# Patient Record
Sex: Male | Born: 1955
Health system: Southern US, Community
[De-identification: ages and names within clinical notes are randomized; demographics above are authoritative.]

## PROBLEM LIST (undated history)

## (undated) DIAGNOSIS — F32A Depression, unspecified: Secondary | ICD-10-CM

## (undated) DIAGNOSIS — R7303 Prediabetes: Secondary | ICD-10-CM

## (undated) DIAGNOSIS — K219 Gastro-esophageal reflux disease without esophagitis: Secondary | ICD-10-CM

## (undated) DIAGNOSIS — E785 Hyperlipidemia, unspecified: Secondary | ICD-10-CM

## (undated) DIAGNOSIS — M1990 Unspecified osteoarthritis, unspecified site: Secondary | ICD-10-CM

## (undated) DIAGNOSIS — E119 Type 2 diabetes mellitus without complications: Secondary | ICD-10-CM

## (undated) DIAGNOSIS — K648 Other hemorrhoids: Secondary | ICD-10-CM

## (undated) DIAGNOSIS — K5730 Diverticulosis of large intestine without perforation or abscess without bleeding: Secondary | ICD-10-CM

## (undated) DIAGNOSIS — C61 Malignant neoplasm of prostate: Secondary | ICD-10-CM

## (undated) DIAGNOSIS — M170 Bilateral primary osteoarthritis of knee: Secondary | ICD-10-CM

## (undated) DIAGNOSIS — I10 Essential (primary) hypertension: Secondary | ICD-10-CM

## (undated) HISTORY — PX: SHOULDER ARTHROSCOPY: SHX128

## (undated) HISTORY — DX: Hyperlipidemia, unspecified: E78.5

## (undated) HISTORY — PX: HERNIA REPAIR: SHX51

## (undated) HISTORY — PX: KNEE ARTHROSCOPY: SUR90

---
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
## 2003-04-12 ENCOUNTER — Ambulatory Visit (HOSPITAL_COMMUNITY): Admission: RE | Admit: 2003-04-12 | Discharge: 2003-04-12 | Payer: Self-pay | Admitting: Internal Medicine

## 2005-06-17 ENCOUNTER — Encounter: Admission: RE | Admit: 2005-06-17 | Discharge: 2005-06-17 | Payer: Self-pay | Admitting: Rheumatology

## 2005-06-29 ENCOUNTER — Ambulatory Visit (HOSPITAL_BASED_OUTPATIENT_CLINIC_OR_DEPARTMENT_OTHER): Admission: RE | Admit: 2005-06-29 | Discharge: 2005-06-29 | Payer: Self-pay | Admitting: Orthopaedic Surgery

## 2006-03-02 ENCOUNTER — Encounter: Admission: RE | Admit: 2006-03-02 | Discharge: 2006-03-02 | Payer: Self-pay | Admitting: Internal Medicine

## 2006-03-07 ENCOUNTER — Encounter: Admission: RE | Admit: 2006-03-07 | Discharge: 2006-03-07 | Payer: Self-pay | Admitting: Internal Medicine

## 2007-02-20 ENCOUNTER — Ambulatory Visit: Payer: Self-pay | Admitting: Internal Medicine

## 2010-08-07 ENCOUNTER — Emergency Department (HOSPITAL_COMMUNITY)
Admission: EM | Admit: 2010-08-07 | Discharge: 2010-08-07 | Disposition: A | Discharge status: Home / Self Care | Payer: BC Managed Care – PPO | Attending: Emergency Medicine | Admitting: Emergency Medicine

## 2010-08-07 ENCOUNTER — Emergency Department (HOSPITAL_COMMUNITY): Payer: BC Managed Care – PPO

## 2010-08-07 DIAGNOSIS — Y92009 Unspecified place in unspecified non-institutional (private) residence as the place of occurrence of the external cause: Secondary | ICD-10-CM | POA: Insufficient documentation

## 2010-08-07 DIAGNOSIS — IMO0002 Reserved for concepts with insufficient information to code with codable children: Secondary | ICD-10-CM | POA: Insufficient documentation

## 2010-08-07 DIAGNOSIS — S61409A Unspecified open wound of unspecified hand, initial encounter: Secondary | ICD-10-CM | POA: Insufficient documentation

## 2010-08-07 DIAGNOSIS — Z1810 Retained metal fragments, unspecified: Secondary | ICD-10-CM | POA: Insufficient documentation

## 2010-08-07 NOTE — Op Note (Signed)
[name], [name] NO.:  [id number]   MEDICAL RECORD NO.:  [id number]          PATIENT TYPE:  AMB   LOCATION:  DSC                          FACILITY:  [facility]   PHYSICIAN:  Claude Manges. Whitfield, M.D.DATE OF BIRTH:  07-11-1955   DATE OF PROCEDURE:  06/29/2005  DATE OF DISCHARGE:                                 OPERATIVE REPORT   PREOPERATIVE DIAGNOSIS:  Impingement right shoulder with degenerative joint  disease acromioclavicular joint.   POSTOPERATIVE DIAGNOSIS:  Impingement right shoulder with degenerative joint  disease acromioclavicular joint, with partial tear of biceps tendon base and  posterior labrum.   PROCEDURE:  1.  Arthroscopic debridement, right shoulder, including posterior labrum and      biceps tendon base.  2.  Arthroscopic subacromial decompression.  3.  Open distal clavicle resection.   SURGEON:  Claude Manges. Cleophas Dunker, M.D.   ANESTHESIA:  General with supplemental interscalene nerve block.   COMPLICATIONS:  None.   HISTORY:  55 year old gentleman has been experiencing pain in his right  shoulder for months.  He denies any history of injury or trauma.  He has  had evidence of pain at the [facility] joint with impingement with an MRI scan  revealing severe AC joint arthropathy with edema, capsular distension, and  prominent spurring which would predispose to impingement.  He had mild  subdeltoid bursitis, otherwise, there were no other significant  abnormalities. He did have a type 3 acromion.   DESCRIPTION OF PROCEDURE:  With the patient comfortable on the operating  table and under general orotracheal anesthesia, the patient was placed in a  semi-sitting position with the shoulder frame.  The right shoulder was then  prepped with DuraPrep and the base of the neck circumferentially below the  elbow.  Sterile draping was performed.  A marking pen was used to outline  the [facility] joint, the coracoid, and acromion.,  At a point a fingerbreadth  posterior  and medial to the posterior angle acromion, a small stab wound was  made prior to which Marcaine with epinephrine was injected. The arthroscope  was easily placed into the shoulder joint.   Diagnostic arthroscopy revealed no evidence of chondromalacia of the glenoid  or humeral head.  There were no loose bodies.  The anterior labrum appeared  to be intact.  There was quite a bit of frayed material within the joint  that appeared to originate from the base of the biceps tendon where there  was considerable fraying. A second portal was established anteriorly and  shaving of the base of the biceps was performed with a shaver and the  ArthroCare wand.  There was some fraying of the posterior labrum but without  evidence of an obvious tear from the glenoid. Further debridement was  performed with the ArthroCare wand with a complete debridement.  There was  no evidence of a partial rotator cuff tear.   The arthroscope was then placed in the subacromial space posteriorly, the  cannula in the subacromial space anteriorly, and a third portal was  established in the lateral subacromial space.  An arthroscopic subacromial  decompression was performed removing a moderate amount of bursal tissue.  There was obvious and significant anterior and lateral overhang of the  acromion.  The 6 mm bur was used to obtain an anterior inferior  acromioplasty. I was able to visualize the distal clavicle, there were areas  of erosive cartilage changes. Because of the significant nature of the  distal clavicular edema, I elected to perform an open distal clavicle  resection.   About a 1 inch incision was made directly from posterior to anterior over  the [facility] joint via sharp dissection, carried down to the subcutaneous tissue,  gross bleeders were Bovie coagulated.  The [facility] joint capsule was easily  identified and incised with the Bovie.  A subperiosteal dissection was  performed along the distal 1 cm to 1.5 cm  clavicle and an oscillating saw  was used to remove the distal clavicle. Bleeding bone was covered with bone  wax and any bleeding was controlled with the Bovie.  By finger palpation,  there was no evidence of impingement or any further spurring beneath the  distal clavicle.  The wound was then irrigated with saline solution.  The [facility]  joint capsule was closed with interrupted 0 Vicryl, subcu Vicryl with 2-0  Vicryl, skin closed with a running 3-0 subcuticular Prolene with Steri-  Strips over Benzoin.  The arthroscopic portals were closed anteriorly and  laterally with a staple and posterior left open for drainage.  The wound was  infiltrated with Marcaine with epinephrine.  A sterile bulky dressing was  applied followed by a sling.   PLAN:  Office in one week,  Percocet for pain.      Claude Manges. Cleophas Dunker, M.D.  Electronically Signed     PWW/MEDQ  D:  06/29/2005  T:  06/29/2005  Job:  [id number]

## 2011-12-27 ENCOUNTER — Ambulatory Visit (HOSPITAL_COMMUNITY)
Admission: RE | Admit: 2011-12-27 | Discharge: 2011-12-27 | Disposition: A | Discharge status: Home / Self Care | Payer: BC Managed Care – PPO | Source: Ambulatory Visit | Attending: Pulmonary Disease | Admitting: Pulmonary Disease

## 2011-12-27 ENCOUNTER — Other Ambulatory Visit (HOSPITAL_COMMUNITY): Payer: Self-pay | Admitting: Pulmonary Disease

## 2011-12-27 DIAGNOSIS — M542 Cervicalgia: Secondary | ICD-10-CM

## 2011-12-27 DIAGNOSIS — M5030 Other cervical disc degeneration, unspecified cervical region: Secondary | ICD-10-CM | POA: Insufficient documentation

## 2012-01-07 ENCOUNTER — Other Ambulatory Visit (HOSPITAL_COMMUNITY): Payer: Self-pay | Admitting: Pulmonary Disease

## 2012-01-07 DIAGNOSIS — R200 Anesthesia of skin: Secondary | ICD-10-CM

## 2012-01-07 DIAGNOSIS — R52 Pain, unspecified: Secondary | ICD-10-CM

## 2012-01-11 ENCOUNTER — Other Ambulatory Visit (HOSPITAL_COMMUNITY): Payer: BC Managed Care – PPO

## 2012-01-18 ENCOUNTER — Ambulatory Visit (HOSPITAL_COMMUNITY): Payer: BC Managed Care – PPO

## 2012-03-22 DIAGNOSIS — K648 Other hemorrhoids: Secondary | ICD-10-CM

## 2012-03-22 DIAGNOSIS — K5730 Diverticulosis of large intestine without perforation or abscess without bleeding: Secondary | ICD-10-CM

## 2012-03-22 HISTORY — DX: Diverticulosis of large intestine without perforation or abscess without bleeding: K57.30

## 2012-03-22 HISTORY — DX: Other hemorrhoids: K64.8

## 2012-07-10 ENCOUNTER — Telehealth: Payer: Self-pay

## 2012-07-10 NOTE — Telephone Encounter (Signed)
Pt was referred by Dr. Juanetta Gosling for screening colonoscopy. I called and LMOM for a return call.

## 2012-07-25 NOTE — Telephone Encounter (Signed)
LMOM to call.

## 2012-08-17 ENCOUNTER — Telehealth: Payer: Self-pay

## 2012-08-17 DIAGNOSIS — Z1211 Encounter for screening for malignant neoplasm of colon: Secondary | ICD-10-CM

## 2012-08-18 ENCOUNTER — Other Ambulatory Visit: Payer: Self-pay

## 2012-08-18 DIAGNOSIS — Z1211 Encounter for screening for malignant neoplasm of colon: Secondary | ICD-10-CM

## 2012-08-21 NOTE — Telephone Encounter (Signed)
Gastroenterology Pre-Procedure Form     Request Date: 08/17/2012     Requesting Physician: Dr. Juanetta Gosling     PATIENT INFORMATION:  [name] is a 57 y.o., male (DOB=Nov 08, 1955).  PROCEDURE: Procedure(s) requested: colonoscopy Procedure Reason: screening for colon cancer  PATIENT REVIEW QUESTIONS: The patient reports the following:   1. Diabetes Melitis: no 2. Joint replacements in the past 12 months: no 3. Major health problems in the past 3 months: no 4. Has an artificial valve or MVP:no 5. Has been advised in past to take antibiotics in advance of a procedure like teeth cleaning: no}    MEDICATIONS & ALLERGIES:    Patient reports the following regarding taking any blood thinners:   Plavix? no Aspirin?no Coumadin?  no  Patient confirms/reports the following medications:  Current Outpatient Prescriptions  Medication Sig Dispense Refill  . acetaminophen (TYLENOL) 325 MG tablet Take 650 mg by mouth every 6 (six) hours as needed for pain. Ocassionally      . naproxen sodium (ANAPROX) 220 MG tablet Take 220 mg by mouth 2 (two) times daily with a meal. As needed       No current facility-administered medications for this visit.    Patient confirms/reports the following allergies:  No Known Allergies  Patient is appropriate to schedule for requested procedure(s): yes  AUTHORIZATION INFORMATION Primary Insurance:   ID #:   Group #:  Pre-Cert / Auth required: Pre-Cert / Auth #:   Secondary Insurance:   ID #:   Group #:  Pre-Cert / Auth required: Pre-Cert / Auth #:   No orders of the defined types were placed in this encounter.    SCHEDULE INFORMATION: Procedure has been scheduled as follows:  Date:  09/13/2012            Time: 9:30 AM  Location: [facility] Short Stay  This Gastroenterology Pre-Precedure Form is being routed to the following provider(s) for review: Jonette Eva, MD

## 2012-08-21 NOTE — Telephone Encounter (Signed)
See triage dated 08/17/2012.

## 2012-08-22 ENCOUNTER — Other Ambulatory Visit: Payer: BC Managed Care – PPO

## 2012-08-22 MED ORDER — SOD PICOSULFATE-MAG OX-CIT ACD 10-3.5-12 MG-GM-GM PO PACK: 1.0000 | PACK | Freq: Once | ORAL | Status: DC

## 2012-08-22 NOTE — Telephone Encounter (Signed)
PREPOPIK-DRINK WATER TO KEEP URINE LIGHT YELLOW.  PT SHOULD DROP OFF RX 3 DAYS PRIOR TO PROCEDURE.  Do not take naproxen on the day of or day before his procedure.

## 2012-08-22 NOTE — Telephone Encounter (Signed)
Rx was sent to the pharmacy and instructions were mailed to pt.  

## 2012-08-23 ENCOUNTER — Other Ambulatory Visit: Payer: Self-pay | Admitting: Orthopaedic Surgery

## 2012-08-23 DIAGNOSIS — M25561 Pain in right knee: Secondary | ICD-10-CM

## 2012-08-24 ENCOUNTER — Ambulatory Visit
Admission: RE | Admit: 2012-08-24 | Discharge: 2012-08-24 | Disposition: A | Discharge status: Home / Self Care | Payer: BC Managed Care – PPO | Source: Ambulatory Visit | Attending: Orthopaedic Surgery | Admitting: Orthopaedic Surgery

## 2012-08-24 ENCOUNTER — Other Ambulatory Visit: Payer: BC Managed Care – PPO

## 2012-08-24 DIAGNOSIS — M25561 Pain in right knee: Secondary | ICD-10-CM

## 2012-08-31 ENCOUNTER — Encounter (HOSPITAL_COMMUNITY): Payer: Self-pay | Admitting: Pharmacy Technician

## 2012-09-13 ENCOUNTER — Encounter (HOSPITAL_COMMUNITY): Payer: Self-pay

## 2012-09-13 ENCOUNTER — Encounter (HOSPITAL_COMMUNITY)
Admission: RE | Disposition: A | Discharge status: Home / Self Care | Payer: Self-pay | Source: Ambulatory Visit | Attending: Gastroenterology

## 2012-09-13 ENCOUNTER — Ambulatory Visit (HOSPITAL_COMMUNITY)
Admission: RE | Admit: 2012-09-13 | Discharge: 2012-09-13 | Disposition: A | Discharge status: Home / Self Care | Payer: BC Managed Care – PPO | Source: Ambulatory Visit | Attending: Gastroenterology | Admitting: Gastroenterology

## 2012-09-13 DIAGNOSIS — Z1211 Encounter for screening for malignant neoplasm of colon: Secondary | ICD-10-CM | POA: Insufficient documentation

## 2012-09-13 DIAGNOSIS — K648 Other hemorrhoids: Secondary | ICD-10-CM | POA: Insufficient documentation

## 2012-09-13 DIAGNOSIS — K5730 Diverticulosis of large intestine without perforation or abscess without bleeding: Secondary | ICD-10-CM

## 2012-09-13 HISTORY — PX: COLONOSCOPY: SHX5424

## 2012-09-13 SURGERY — COLONOSCOPY
Anesthesia: Moderate Sedation

## 2012-09-13 MED ORDER — MIDAZOLAM HCL 5 MG/5ML IJ SOLN
INTRAMUSCULAR | Status: DC | PRN
  Administered 2012-09-13 (×3): 2 mg via INTRAVENOUS

## 2012-09-13 MED ORDER — MEPERIDINE HCL 100 MG/ML IJ SOLN
INTRAMUSCULAR | Status: DC | PRN
  Administered 2012-09-13 (×3): 25 mg via INTRAVENOUS

## 2012-09-13 MED ORDER — MIDAZOLAM HCL 5 MG/5ML IJ SOLN
INTRAMUSCULAR | Status: AC
  Filled 2012-09-13: qty 10

## 2012-09-13 MED ORDER — MEPERIDINE HCL 100 MG/ML IJ SOLN
INTRAMUSCULAR | Status: AC
  Filled 2012-09-13: qty 2

## 2012-09-13 MED ORDER — STERILE WATER FOR IRRIGATION IR SOLN
Status: DC | PRN
  Administered 2012-09-13: 09:00:00

## 2012-09-13 MED ORDER — SODIUM CHLORIDE 0.9 % IV SOLN
INTRAVENOUS | Status: DC
  Administered 2012-09-13: 09:00:00 via INTRAVENOUS

## 2012-09-13 NOTE — H&P (Signed)
  Primary Care Physician:  No primary provider on file. Primary Gastroenterologist:  Dr. Darrick Penna  Pre-Procedure History & Physical: HPI:  [name] is a 57 y.o. male here for COLON CANCER SCREENING.   History reviewed. No pertinent past medical history.  Past Surgical History  Procedure Laterality Date  . Knee arthroscopy Left     15 years ago  . Hernia repair      20 years ago  . Shoulder arthroscopy Right     15 years ago    Prior to Admission medications   Medication Sig Start Date End Date Taking? Authorizing Provider  Naproxen Sodium (ALEVE) 220 MG CAPS Take 2 capsules by mouth daily as needed (headache/pain).   Yes Historical Provider, MD  Sod Picosulfate-Mag Ox-Cit Acd 10-3.5-12 MG-GM-GM PACK Take 1 kit by mouth once. 08/22/12  Yes [place] Bali, MD    Allergies as of 08/18/2012  . (Not on File)    History reviewed. No pertinent family history.  History   Social History  . Marital Status: Married    Spouse Name: N/A    Number of Children: N/A  . Years of Education: N/A   Occupational History  . Not on file.   Social History Main Topics  . Smoking status: Never Smoker   . Smokeless tobacco: Not on file  . Alcohol Use: No  . Drug Use: No  . Sexually Active: Not on file   Other Topics Concern  . Not on file   Social History Narrative  . No narrative on file    Review of Systems: See HPI, otherwise negative ROS   Physical Exam: BP 136/84  Pulse 75  Temp(Src) 97.7 F (36.5 C) (Oral)  Resp 16  Ht 5\' 9"  (1.753 m)  Wt 190 lb (86.183 kg)  BMI 28.05 kg/m2  SpO2 96% General:   Alert,  pleasant and cooperative in NAD Head:  Normocephalic and atraumatic. Neck:  Supple; Lungs:  Clear throughout to auscultation.    Heart:  Regular rate and rhythm. Abdomen:  Soft, nontender and nondistended. Normal bowel sounds, without guarding, and without rebound.   Neurologic:  Alert and  oriented x4;  grossly normal neurologically.  Impression/Plan:      SCREENING  Plan:  1. TCS TODAY

## 2012-09-13 NOTE — Op Note (Signed)
[facility] [address] [place], [address]   COLONOSCOPY PROCEDURE REPORT  PATIENT: [name], [name]  MR#: [id number] BIRTHDATE: [birth year] , 56  yrs. old GENDER: Male ENDOSCOPIST: Jonette Eva, MD REFERRED XB:JYNWGN Juanetta Gosling, M.D. PROCEDURE DATE:  09/13/2012 PROCEDURE:   Colonoscopy, screening INDICATIONS:Average risk patient for colon cancer.  Ate bacon cheesburger from [employer] at lunch. MEDICATIONS: Demerol 75 mg IV and Versed 6 mg IV  DESCRIPTION OF PROCEDURE:    Physical exam was performed.  Informed consent was obtained from the patient after explaining the benefits, risks, and alternatives to procedure.  The patient was connected to monitor and placed in left lateral position. Continuous oxygen was provided by nasal cannula and IV medicine administered through an indwelling cannula.  After administration of sedation and rectal exam, the patients rectum was intubated and the EC-3890Li ([id number])  colonoscope was advanced under direct visualization to the ileum.  The scope was removed slowly by carefully examining the color, texture, anatomy, and integrity mucosa on the way out.  The patient was recovered in endoscopy and discharged home in satisfactory condition.     COLON FINDINGS: The mucosa appeared normal in the terminal ileum.  , Mild diverticulosis was noted in the sigmoid colon.  , and Small internal hemorrhoids were found.  PREP QUALITY: good.     CECAL W/D TIME: 15 minutes COMPLICATIONS: None  ENDOSCOPIC IMPRESSION: 1.   Normal mucosa in the terminal ileum 2.   Mild diverticulosis in the sigmoid colon 3.   Small internal hemorrhoids   RECOMMENDATIONS: HIGH FIBER DIET TCS IN 10 YEARS       _______________________________ eSignedJonette Eva, MD 09/13/2012 10:23 AM

## 2012-09-14 ENCOUNTER — Encounter (HOSPITAL_COMMUNITY): Payer: Self-pay | Admitting: Gastroenterology

## 2015-08-07 DIAGNOSIS — M25562 Pain in left knee: Secondary | ICD-10-CM | POA: Diagnosis not present

## 2015-08-07 DIAGNOSIS — M25561 Pain in right knee: Secondary | ICD-10-CM | POA: Diagnosis not present

## 2016-05-13 DIAGNOSIS — L821 Other seborrheic keratosis: Secondary | ICD-10-CM | POA: Diagnosis not present

## 2016-05-13 DIAGNOSIS — L812 Freckles: Secondary | ICD-10-CM | POA: Diagnosis not present

## 2016-05-17 ENCOUNTER — Ambulatory Visit (INDEPENDENT_AMBULATORY_CARE_PROVIDER_SITE_OTHER): Payer: BLUE CROSS/BLUE SHIELD | Admitting: Orthopaedic Surgery

## 2016-05-17 VITALS — Ht 69.0 in | Wt 190.0 lb

## 2016-05-17 DIAGNOSIS — M1712 Unilateral primary osteoarthritis, left knee: Secondary | ICD-10-CM | POA: Diagnosis not present

## 2016-05-17 DIAGNOSIS — G8929 Other chronic pain: Secondary | ICD-10-CM

## 2016-05-17 DIAGNOSIS — M25561 Pain in right knee: Secondary | ICD-10-CM

## 2016-05-17 DIAGNOSIS — M25562 Pain in left knee: Secondary | ICD-10-CM | POA: Diagnosis not present

## 2016-05-17 DIAGNOSIS — M1711 Unilateral primary osteoarthritis, right knee: Secondary | ICD-10-CM | POA: Diagnosis not present

## 2016-05-17 MED ORDER — LIDOCAINE HCL 1 % IJ SOLN
3.0000 mL | INTRAMUSCULAR | Status: AC | PRN
  Administered 2016-05-17: 3 mL

## 2016-05-17 MED ORDER — METHYLPREDNISOLONE ACETATE 40 MG/ML IJ SUSP
40.0000 mg | INTRAMUSCULAR | Status: AC | PRN
  Administered 2016-05-17: 40 mg via INTRA_ARTICULAR

## 2016-05-17 NOTE — Progress Notes (Signed)
Office Visit Note   Patient: [name]           Date of Birth: 05-13-1955           MRN: [id number] Visit Date: 05/17/2016              Requested by: No referring provider defined for this encounter. PCP: No primary care provider on file.   Assessment & Plan: Visit Diagnoses: No diagnosis found.  Plan: At this point he is tried and failed all forms conservative treatment. We went over knee replacement surgery and explained in detail the risk and benefits of the surgery. He's even tried a long period of physical therapy and almost is program on his knees. Having failed surgery as well as injections we do feel some proceed with the surgery. He understands the risk and benefits of this in detail. I showed him a knee model and explained what is intraoperative and postoperative courses be. He tolerated the steroid injection in his right knee well-padded work on getting him set up for surgery at a later date.  Follow-Up Instructions: Return for 2 weeks post-op.   Orders:  Orders Placed This Encounter  Procedures  . Large Joint Injection/Arthrocentesis   No orders of the defined types were placed in this encounter.     Procedures: Large Joint Inj Date/Time: 05/17/2016 2:34 PM Performed by: Mcarthur Rossetti Authorized by: Mcarthur Rossetti   Location:  Knee Site:  R knee Ultrasound Guidance: No   Fluoroscopic Guidance: No   Arthrogram: No   Medications:  3 mL lidocaine 1 %; 40 mg methylPREDNISolone acetate 40 MG/ML     Clinical Data: No additional findings.   Subjective: Chief Complaint  Patient presents with  . Right Knee - Pain    R>L knee pain. The right knee is getting really bad and he has decreased flexibility. He had knee injections 08/08/2015 that did really well. Wants to discuss knee replacements.   . Left Knee - Pain    HPI Seen the patient for many years now. He's had multiple arthroscopic interventions on both knees with 4 on the left  knee and at least one on the right knee. His pain is daily and now it's detrimentally affected activities daily living, his quality of life and mobility. He does wish proceed with knee replacement surgery at this point he like to have done both at the same time. He is a very healthy individual he's athletic individual. He has already tried multiple injections in each knee. He is tried activity modification and resorted thin individual. Review of Systems He denies any chest pain, shortness of breath, fever, chills, nausea of vomiting  Objective: Vital Signs: Ht 5\' 9"  (1.753 m)   Wt 190 lb (86.2 kg)   BMI 28.06 kg/m   Physical Exam He is alert and oriented 3 and in no acute distress Ortho Exam His physical exam in terms of his lungs, heart, abdomen and neuro exam are all normal. Examination of both of his knee shows slight varus 40. Both knees have severe patellofemoral crepitation and can get him to full flexion or extension on both knees. There is no effusion today. Both knees are Lemus a stable but are very painful. Specialty Comments:  No specialty comments available.  Imaging: No results found. X-rays from last year both his knees are really reviewed independently. His left knee has severe try femoral arthritis in the right knee has moderate to severe tricompartmental arthritis  PMFS  History: There are no active problems to display for this patient.  No past medical history on file.  No family history on file.  Past Surgical History:  Procedure Laterality Date  . COLONOSCOPY N/A 09/13/2012   Procedure: COLONOSCOPY;  Surgeon: Danie Binder, MD;  Location: [facility];  Service: Endoscopy;  Laterality: N/A;  9:30 AM  . HERNIA REPAIR     20 years ago  . KNEE ARTHROSCOPY Left    15 years ago  . SHOULDER ARTHROSCOPY Right    15 years ago   Social History   Occupational History  . Not on file.   Social History Main Topics  . Smoking status: Never Smoker  . Smokeless  tobacco: Not on file  . Alcohol use No  . Drug use: No  . Sexual activity: Not on file

## 2016-07-26 DIAGNOSIS — E119 Type 2 diabetes mellitus without complications: Secondary | ICD-10-CM | POA: Diagnosis not present

## 2016-09-07 DIAGNOSIS — S90931A Unspecified superficial injury of right great toe, initial encounter: Secondary | ICD-10-CM | POA: Diagnosis not present

## 2016-09-07 DIAGNOSIS — E1165 Type 2 diabetes mellitus with hyperglycemia: Secondary | ICD-10-CM | POA: Diagnosis not present

## 2016-09-07 DIAGNOSIS — S90936A Unspecified superficial injury of unspecified lesser toe(s), initial encounter: Secondary | ICD-10-CM | POA: Diagnosis not present

## 2017-01-04 DIAGNOSIS — G629 Polyneuropathy, unspecified: Secondary | ICD-10-CM | POA: Diagnosis not present

## 2017-01-04 DIAGNOSIS — N411 Chronic prostatitis: Secondary | ICD-10-CM | POA: Diagnosis not present

## 2017-01-04 DIAGNOSIS — E119 Type 2 diabetes mellitus without complications: Secondary | ICD-10-CM | POA: Diagnosis not present

## 2017-01-04 DIAGNOSIS — N410 Acute prostatitis: Secondary | ICD-10-CM | POA: Diagnosis not present

## 2017-01-04 DIAGNOSIS — Z23 Encounter for immunization: Secondary | ICD-10-CM | POA: Diagnosis not present

## 2017-01-04 DIAGNOSIS — Z1389 Encounter for screening for other disorder: Secondary | ICD-10-CM | POA: Diagnosis not present

## 2017-01-04 DIAGNOSIS — Z6827 Body mass index (BMI) 27.0-27.9, adult: Secondary | ICD-10-CM | POA: Diagnosis not present

## 2017-01-05 DIAGNOSIS — N410 Acute prostatitis: Secondary | ICD-10-CM | POA: Diagnosis not present

## 2017-01-05 DIAGNOSIS — Z6827 Body mass index (BMI) 27.0-27.9, adult: Secondary | ICD-10-CM | POA: Diagnosis not present

## 2017-01-18 DIAGNOSIS — L570 Actinic keratosis: Secondary | ICD-10-CM | POA: Diagnosis not present

## 2017-01-18 DIAGNOSIS — L578 Other skin changes due to chronic exposure to nonionizing radiation: Secondary | ICD-10-CM | POA: Diagnosis not present

## 2017-02-21 ENCOUNTER — Ambulatory Visit (INDEPENDENT_AMBULATORY_CARE_PROVIDER_SITE_OTHER): Payer: BLUE CROSS/BLUE SHIELD | Admitting: Orthopaedic Surgery

## 2017-02-21 ENCOUNTER — Encounter (INDEPENDENT_AMBULATORY_CARE_PROVIDER_SITE_OTHER): Payer: Self-pay | Admitting: Orthopaedic Surgery

## 2017-02-21 ENCOUNTER — Ambulatory Visit (INDEPENDENT_AMBULATORY_CARE_PROVIDER_SITE_OTHER): Payer: BLUE CROSS/BLUE SHIELD

## 2017-02-21 DIAGNOSIS — M1712 Unilateral primary osteoarthritis, left knee: Secondary | ICD-10-CM

## 2017-02-21 DIAGNOSIS — M25561 Pain in right knee: Secondary | ICD-10-CM | POA: Diagnosis not present

## 2017-02-21 DIAGNOSIS — M25562 Pain in left knee: Secondary | ICD-10-CM

## 2017-02-21 DIAGNOSIS — M1711 Unilateral primary osteoarthritis, right knee: Secondary | ICD-10-CM

## 2017-02-21 DIAGNOSIS — G8929 Other chronic pain: Secondary | ICD-10-CM | POA: Insufficient documentation

## 2017-02-21 NOTE — Progress Notes (Signed)
Office Visit Note   Patient: [name]           Date of Birth: 04-03-1955           MRN: [id number] Visit Date: 02/21/2017              Requested by: [name], MD [address] [place], [place] [address] PCP: [name], MD   Assessment & Plan: Visit Diagnoses:  1. Chronic pain of both knees   2. Unilateral primary osteoarthritis, left knee   3. Unilateral primary osteoarthritis, right knee     Plan: Given the fact that he is very healthy individual and not a smoker nondiabetic I do feel that he is a candidate for bilateral knee replacements also given the fact that his pain is significant on both knees and his arthritis is significant on both knees.  We had a long and thorough discussion about the surgery including some the knee model and explained what his intraoperative and postoperative courses involved.  He understands all his risks are heightened given bilateral knee replacements.  All questions concerns were answered and addressed.  He would like to have this set up for January 2019.  Again he is failed all forms of conservative treatment for many years now.  Follow-Up Instructions: Return for 2 weeks post-op.   Orders:  Orders Placed This Encounter  Procedures  . XR Knee 1-2 Views Left  . XR Knee 1-2 Views Right   No orders of the defined types were placed in this encounter.     Procedures: No procedures performed   Clinical Data: No additional findings.   Subjective: No chief complaint on file. The patient is well-known to [place].  We have seen him for many years now with bilateral knee osteoarthritis and degenerative joint disease.  Radiographically his left knee has been worse than his right but his right knee is always hurt worse than left.  He has had arthroscopic surgery on the left knee.  He is tried and failed multiple forms of conservative treatment over years now including multiple steroid injections and hyaluronic acid injections.  His  work on activity modification and weight loss as well as quad strengthening exercises.  He said therapy as well.  He is at the point where he like to discuss bilateral knee replacements.  His pain is daily and is detrimentally affect his activities of living, and his quality of life, his mobility.  His pain can be 10 out of 10.  He is not a diabetic and not a smoker.  He is alert and oriented x3 and in no acute distress  HPI  Review of Systems He currently denies any headache, chest pain, shortness of breath, fever, chills, nausea, vomiting.  Objective: Vital Signs: There were no vitals taken for this visit.  Physical Exam  Ortho Exam Examination of both his knee shows stiffness with good range of motion but very stiff.  Both knees have varus malalignment and patellofemoral crepitation Specialty Comments:  No specialty comments available.  Imaging: Xr Knee 1-2 Views Left  Result Date: 02/21/2017 2 views of the left knee show severe end-stage arthritis with tricompartment osteophytes in all 3 compartments as well as sclerotic changes and severe joint space narrowing throughout the knee.  Xr Knee 1-2 Views Right  Result Date: 02/21/2017 2 views of the right knee show tricompartmental arthritic changes throughout the knee.  There are para-articular osteophytes in all 3 compartments and slight varus malalignment.  There is medial  joint line narrowing.    PMFS History: Patient Active Problem List   Diagnosis Date Noted  . Chronic pain of both knees 02/21/2017  . Unilateral primary osteoarthritis, left knee 02/21/2017  . Unilateral primary osteoarthritis, right knee 02/21/2017   History reviewed. No pertinent past medical history.  History reviewed. No pertinent family history.  Past Surgical History:  Procedure Laterality Date  . COLONOSCOPY N/A 09/13/2012   Procedure: COLONOSCOPY;  Surgeon: Danie Binder, MD;  Location: [facility];  Service: Endoscopy;  Laterality: N/A;  9:30  AM  . HERNIA REPAIR     20 years ago  . KNEE ARTHROSCOPY Left    15 years ago  . SHOULDER ARTHROSCOPY Right    15 years ago   Social History   Occupational History  . Not on file  Tobacco Use  . Smoking status: Never Smoker  Substance and Sexual Activity  . Alcohol use: No  . Drug use: No  . Sexual activity: Not on file

## 2017-03-09 DIAGNOSIS — N401 Enlarged prostate with lower urinary tract symptoms: Secondary | ICD-10-CM | POA: Diagnosis not present

## 2017-03-09 DIAGNOSIS — R3912 Poor urinary stream: Secondary | ICD-10-CM | POA: Diagnosis not present

## 2017-03-09 DIAGNOSIS — N50811 Right testicular pain: Secondary | ICD-10-CM | POA: Diagnosis not present

## 2017-03-09 DIAGNOSIS — R972 Elevated prostate specific antigen [PSA]: Secondary | ICD-10-CM | POA: Diagnosis not present

## 2017-03-18 DIAGNOSIS — N509 Disorder of male genital organs, unspecified: Secondary | ICD-10-CM | POA: Diagnosis not present

## 2017-03-22 DIAGNOSIS — E119 Type 2 diabetes mellitus without complications: Secondary | ICD-10-CM

## 2017-03-22 HISTORY — DX: Type 2 diabetes mellitus without complications: E11.9

## 2017-03-31 DIAGNOSIS — R972 Elevated prostate specific antigen [PSA]: Secondary | ICD-10-CM | POA: Diagnosis not present

## 2017-03-31 DIAGNOSIS — C61 Malignant neoplasm of prostate: Secondary | ICD-10-CM | POA: Diagnosis not present

## 2017-04-05 ENCOUNTER — Other Ambulatory Visit: Payer: Self-pay | Admitting: Urology

## 2017-04-05 DIAGNOSIS — C61 Malignant neoplasm of prostate: Secondary | ICD-10-CM

## 2017-04-06 ENCOUNTER — Encounter (HOSPITAL_COMMUNITY): Payer: BLUE CROSS/BLUE SHIELD

## 2017-04-08 ENCOUNTER — Encounter (HOSPITAL_COMMUNITY): Admission: RE | Admit: 2017-04-08 | Payer: BLUE CROSS/BLUE SHIELD | Source: Ambulatory Visit

## 2017-04-12 ENCOUNTER — Encounter (HOSPITAL_COMMUNITY)
Admission: RE | Admit: 2017-04-12 | Discharge: 2017-04-12 | Disposition: A | Discharge status: Home / Self Care | Payer: BLUE CROSS/BLUE SHIELD | Source: Ambulatory Visit | Attending: Urology | Admitting: Urology

## 2017-04-12 DIAGNOSIS — C61 Malignant neoplasm of prostate: Secondary | ICD-10-CM | POA: Insufficient documentation

## 2017-04-12 MED ORDER — AXUMIN (FLUCICLOVINE F 18) INJECTION
10.5000 | Freq: Once | INTRAVENOUS | Status: AC
  Administered 2017-04-12: 10.5 via INTRAVENOUS

## 2017-04-15 ENCOUNTER — Other Ambulatory Visit (HOSPITAL_COMMUNITY): Payer: BLUE CROSS/BLUE SHIELD

## 2017-04-19 ENCOUNTER — Ambulatory Visit (HOSPITAL_COMMUNITY): Payer: Self-pay

## 2017-04-19 ENCOUNTER — Telehealth: Payer: Self-pay | Admitting: Medical Oncology

## 2017-04-19 ENCOUNTER — Encounter: Payer: Self-pay | Admitting: *Deleted

## 2017-04-19 NOTE — Telephone Encounter (Signed)
I called pt to introduce myself as the Prostate Nurse Navigator and the Coordinator of the Prostate [place].  1. I confirmed with the patient he is aware of his referral to the clinic February 8 arriving at 8:00am.  2. I discussed the format of the clinic and the physicians he will be seeing that day.  3. I discussed where the clinic is located and how to contact me.  4. I confirmed his address and informed him I would be mailing a packet of information and forms to be completed. I asked him to bring them with him the day of his appointment.   He voiced understanding of the above. I asked him to call me if he has any questions or concerns regarding his appointments or the forms he needs to complete.

## 2017-04-22 ENCOUNTER — Encounter: Payer: Self-pay | Admitting: Medical Oncology

## 2017-04-28 ENCOUNTER — Encounter: Payer: Self-pay | Admitting: Radiation Oncology

## 2017-04-28 ENCOUNTER — Telehealth: Payer: Self-pay | Admitting: Medical Oncology

## 2017-04-28 DIAGNOSIS — C61 Malignant neoplasm of prostate: Secondary | ICD-10-CM | POA: Insufficient documentation

## 2017-04-28 NOTE — Progress Notes (Signed)
Called [facility] to request prostate biopsy slides for [facility].

## 2017-04-28 NOTE — Progress Notes (Signed)
GU Location of Tumor / Histology: prostatic adenocarcinoma  If Prostate Cancer, Gleason Score is (4 + 5) and PSA is (15.6). Prostate volume: 35 grams  [name] presented to Dr. Jeffie Pollock with testicular pain x 1 month in December 2018. Reported lifting or twisting and intercourse made the testicular pain worse. Reported Aleve made the pain better.  Biopsies of prostate (if applicable) revealed:    Past/Anticipated interventions by urology, if any: biopsy, referral to [facility]  Past/Anticipated interventions by medical oncology, if any: no  Weight changes, if any: no  Bowel/Bladder complaints, if any: no   Nausea/Vomiting, if any: no  Pain issues, if any:  Testicular pain   SAFETY ISSUES:  Prior radiation? no  Pacemaker/ICD? no  Possible current pregnancy? no  Is the patient on methotrexate? no  Current Complaints / other details:  62 year old male. Married. 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 One daughter (adopted).

## 2017-04-28 NOTE — Telephone Encounter (Signed)
Called patient to confirm appointment for Prostate [facility] 04/29/17 arriving at 8:00 am. I reminded him to bring his completed medical forms and reviewed valet parking and registration. He voiced understanding.

## 2017-04-29 ENCOUNTER — Encounter: Payer: Self-pay | Admitting: General Practice

## 2017-04-29 ENCOUNTER — Other Ambulatory Visit: Payer: Self-pay

## 2017-04-29 ENCOUNTER — Ambulatory Visit
Admission: RE | Admit: 2017-04-29 | Discharge: 2017-04-29 | Disposition: A | Discharge status: Home / Self Care | Payer: BLUE CROSS/BLUE SHIELD | Source: Ambulatory Visit | Attending: Radiation Oncology | Admitting: Radiation Oncology

## 2017-04-29 ENCOUNTER — Encounter: Payer: Self-pay | Admitting: Medical Oncology

## 2017-04-29 ENCOUNTER — Encounter: Payer: Self-pay | Admitting: Radiation Oncology

## 2017-04-29 ENCOUNTER — Inpatient Hospital Stay: Payer: BLUE CROSS/BLUE SHIELD | Attending: Oncology | Admitting: Oncology

## 2017-04-29 VITALS — BP 157/79 | HR 63 | Temp 98.0°F | Resp 18 | Ht 70.0 in | Wt 189.0 lb

## 2017-04-29 DIAGNOSIS — Z801 Family history of malignant neoplasm of trachea, bronchus and lung: Secondary | ICD-10-CM | POA: Insufficient documentation

## 2017-04-29 DIAGNOSIS — Z79891 Long term (current) use of opiate analgesic: Secondary | ICD-10-CM | POA: Insufficient documentation

## 2017-04-29 DIAGNOSIS — C61 Malignant neoplasm of prostate: Secondary | ICD-10-CM | POA: Insufficient documentation

## 2017-04-29 DIAGNOSIS — Z791 Long term (current) use of non-steroidal anti-inflammatories (NSAID): Secondary | ICD-10-CM | POA: Diagnosis not present

## 2017-04-29 DIAGNOSIS — Z809 Family history of malignant neoplasm, unspecified: Secondary | ICD-10-CM | POA: Diagnosis not present

## 2017-04-29 DIAGNOSIS — R972 Elevated prostate specific antigen [PSA]: Secondary | ICD-10-CM | POA: Diagnosis not present

## 2017-04-29 DIAGNOSIS — Z79899 Other long term (current) drug therapy: Secondary | ICD-10-CM | POA: Diagnosis not present

## 2017-04-29 HISTORY — DX: Unspecified osteoarthritis, unspecified site: M19.90

## 2017-04-29 HISTORY — DX: Malignant neoplasm of prostate: C61

## 2017-04-29 NOTE — Progress Notes (Signed)
[place] Psychosocial Distress Screening Spiritual Care  Met with [name] and family in [facility] to introduce [place] team/resources, reviewing distress screen per protocol.  The patient scored a 0 on the Psychosocial Distress Thermometer which indicates minimal distress. Also assessed for distress and other psychosocial needs.   [facility] DISTRESS SCREENING 04/29/2017  Screening Type Initial Screening  Distress experienced in past week (1-10) 0  Referral to support programs Yes   Per pt and family, distress is minimal. They are eager to move forward with treatment plan. Dtr [name]/RN and I are personally acquainted from working at [facility] together.  Pt/family are aware of array of [place] team members and programs and know to contact us anytime. Please also page if immediate needs arise or circumstances change. Thank you.  Follow up needed: No.   Chaplain Lorrin Jackson, [place], [facility] Pager [phone] Voicemail [phone]

## 2017-04-29 NOTE — Consult Note (Signed)
[facility]     04/29/2017   --------------------------------------------------------------------------------   [name]  MRN: [id number]  PRIMARY CARE:    DOB: May 31, 1955, 62 year old Male  REFERRING:  Mark Morita, MD  SSN: [id number]  PROVIDER:  Irine Beard, M.D.    TREATING:  [name], M.D.    LOCATION:  [facility] [phone]   --------------------------------------------------------------------------------   CC/HPI: CC: Prostate Cancer   Physician requesting consult: [name]  PCP: [name]  Location of consult: [facility] - Prostate Cancer Multidisciplinary Clinic   [name] is a 62 year old gentleman who was noted to have an elevated PSA of 15.6 and a diffusely firm prostate. He underwent a TRUS biopsy of the prostate on 03/31/17 that indicated Gleason 4+5=9 adenocarcinoma with 12 out of 13 biopsy cores positive including 90% of a biopsy of the left seminal vesicle.   Family history: None.   Imaging studies: He did undergo a fluciclovine PET scan. This did demonstrate a avid uptake throughout the entire prostate without evidence of any obvious metastatic disease.   PMH: He has a history of hyperglycemia.  PSH: Right inguinal hernia repair   TNM stage: cT3b N0 M0  PSA: 15.6  Gleason score: 4+5=9  Biopsy (03/31/17): 12/13 cores positive  Left: L lateral apex (80%, 4+3=7, PNI), L apex (60%, 3+4=7, PNI), L lateral mid (90%, 4+5=9, PNI), L mid (90%, 3+5=8, PNI), L lateral base (80%, 4+4=8, PNI), L base (90%, 4+3=7)  Right: R apex (50%, 3+4=7), R lateral apex (30%, 3+4=7), R mid (5%, 3+4=7), R base (70%, 3+4=7, PNI), R lateral base (10%, 4+3=7)  SV: L SV (90%, 4+5=9, PNI)  Prostate volume: 35.0 cc   Nomogram  OC disease: 3%  EPE: 96%  SVI: 74%  LNI: 71%  PFS (5 year, 10 year): 10%,6%   Urinary function: IPSS is 17.  Erectile function: SHIM score is 25.     ALLERGIES: None   MEDICATIONS:  Levaquin 750 mg tablet 1 tablet 1 hour prior to procedure  Tamsulosin Hcl 0.4 mg capsule 1 capsule PO Daily  Aleve PRN  Diazepam 10 mg tablet 1-2 tablet PO Daily Take one hour prior to procedure.  Tramadol Hcl 50 mg tablet 1 tablet PO Q 6 H PRN     GU PSH: Prostate Needle Biopsy - 03/31/2017    NON-GU PSH: Incisional hernia repair (open) Knee replacement, Bilateral Shoulder Surgery (Unspecified), Right Surgical Pathology, Gross And Microscopic Examination For Prostate Needle - 03/31/2017    GU PMH: Elevated PSA - 03/31/2017, He had an elevated PSA with possible prostatitis. His symptoms are a little better post bactrim but his prostate is diffusely firm. I am going to repeat a PSA today and if it is not falling, I will have him return for a prostate [place] and biopsy. I reviewed the risks of the biopsy including bleeding, infection and voiding difficulty. If the PSA is falling, I will give him a month of antibiotics and recheck him. , - 03/09/2017 Disorder of male genital organs, unspecified (Worsening, Chronic), Right, Ketorolac 30 mg IM today. Continue with good scrotal support. SMZ-TMP DS 1 po BID X 10 days. - 03/18/2017 BPH w/LUTS, He has persistent LUTS with an IPSS of 21. I am going to resume the tamsulosin and will have him return for a flowrate and PVR. - 03/09/2017 Right testicular pain, He has right testicular pain that may be referred from the right groin. He is  tender in the groin and could have a stain. I have recommended aleve 2 tabs bid for a couple of weeks. - 03/09/2017 Weak Urinary Stream - 03/09/2017    NON-GU PMH: Glycosuria, He has 2+ glucose today and has had borderline hyperglycemia. - 03/09/2017 Arthritis    FAMILY HISTORY: Cancer - Mother Death of family member - Mother, Father    Notes: 1 daughter (adopted)   SOCIAL HISTORY: Marital Status: Married Preferred Language: English; Ethnicity: Not Hispanic Or Latino; Race: White Current Smoking Status: Patient has never  smoked.   Tobacco Use Assessment Completed: Used Tobacco in last 30 days? Does not use smokeless tobacco. Has never drank.  Drinks 3 caffeinated drinks per day. Patient's occupation Engineer, maintenance.    REVIEW OF SYSTEMS:    GU Review Male:   Patient denies frequent urination, hard to postpone urination, burning/ pain with urination, get up at night to urinate, leakage of urine, stream starts and stops, trouble starting your streams, and have to strain to urinate .  Gastrointestinal (Lower):   Patient denies diarrhea and constipation.  Gastrointestinal (Upper):   Patient denies nausea and vomiting.  Constitutional:   Patient denies fever, night sweats, weight loss, and fatigue.  Skin:   Patient denies skin rash/ lesion and itching.  Eyes:   Patient denies blurred vision and double vision.  Ears/ Nose/ Throat:   Patient denies sore throat and sinus problems.  Hematologic/Lymphatic:   Patient denies swollen glands and easy bruising.  Cardiovascular:   Patient denies leg swelling and chest pains.  Respiratory:   Patient denies cough and shortness of breath.  Endocrine:   Patient denies excessive thirst.  Musculoskeletal:   Patient denies back pain and joint pain.  Neurological:   Patient denies headaches and dizziness.  Psychologic:   Patient denies depression and anxiety.   VITAL SIGNS: None   GU PHYSICAL EXAMINATION:    Prostate: His prostate is diffusely firm with increased induration on the left compared to right with extension toward the left base and left seminal vesicle. This is consistent with extraprostatic disease.   MULTI-SYSTEM PHYSICAL EXAMINATION:    Constitutional: Well-nourished. No physical deformities. Normally developed. Good grooming.  Neck: Neck symmetrical, not swollen. Normal tracheal position.  Respiratory: No labored breathing, no use of accessory muscles. Clear bilaterally.  Cardiovascular: Normal temperature, normal extremity pulses, no swelling, no  varicosities. Regular rate and rhythm.  Lymphatic: No enlargement of neck, axillae, groin.  Skin: No paleness, no jaundice, no cyanosis. No lesion, no ulcer, no rash.  Neurologic / Psychiatric: Oriented to time, oriented to place, oriented to person. No depression, no anxiety, no agitation.  Gastrointestinal: No mass, no tenderness, no rigidity, non obese abdomen.  Eyes: Normal conjunctivae. Normal eyelids.  Ears, Nose, Mouth, and Throat: Left ear no scars, no lesions, no masses. Right ear no scars, no lesions, no masses. Nose no scars, no lesions, no masses. Normal hearing. Normal lips.  Musculoskeletal: Normal gait and station of head and neck.     PAST DATA REVIEWED:  Source Of History:  Patient  Lab Test Review:   PSA  Records Review:   Pathology Reports, Previous Patient Records  Urine Test Review:   Urinalysis  X-Ray Review: PET Scan: Reviewed Films.     03/09/17 02/15/17  PSA  Total PSA 15.60 ng/mL 11.722 ng/dl    PROCEDURES: None   ASSESSMENT:      ICD-10 Details  1 GU:   Prostate Cancer - C61    PLAN:  Document Letter(s):  Created for Patient: Clinical Summary         Notes:   1. Very high risk prostate cancer: I had a detailed discussion with Mr. Obrecht, his wife, and his daughter (who is a Marine scientist at [employer]). We discussed the very high risk nature of his disease and the potential that he harbored micrometastatic disease although has had a negative staging evaluation.   I did recommend that he proceed with aggressive therapy of curative intent understanding the realistic nature of cure. He is scheduled to see both Dr. Alen Blew and Dr. Tammi Klippel as well this morning. The patient was counseled about the natural history of prostate cancer and the standard treatment options that are available for prostate cancer. It was explained to him how his age and life expectancy, clinical stage, Gleason score, and PSA affect his prognosis, the decision to proceed with  additional staging studies, as well as how that information influences recommended treatment strategies. We discussed the roles for active surveillance, radiation therapy, surgical therapy, androgen deprivation, as well as ablative therapy options for the treatment of prostate cancer as appropriate to his individual cancer situation. We discussed the risks and benefits of these options with regard to their impact on cancer control and also in terms of potential adverse events, complications, and impact on quality of life particularly related to urinary and sexual function. The patient was encouraged to ask questions throughout the discussion today and all questions were answered to his stated satisfaction. In addition, the patient was provided with and/or directed to appropriate resources and literature for further education about prostate cancer and treatment options.   We discussed surgical therapy for prostate cancer including the different available surgical approaches. We discussed, in detail, the risks and expectations of surgery with regard to cancer control, urinary control, and erectile function as well as the expected postoperative recovery process. Additional risks of surgery including but not limited to bleeding, infection, hernia formation, nerve damage, lymphocele formation, bowel/rectal injury potentially necessitating colostomy, damage to the urinary tract resulting in urine leakage, urethral stricture, and the cardiopulmonary risks such as myocardial infarction, stroke, death, venothromboembolism, etc. were explained. The risk of open surgical conversion for robotic/laparoscopic prostatectomy was also discussed.   Mr. Kestler understands that his options for therapy would include multimodality therapy either with primary surgical therapy likely followed by adjuvant radiation therapy versus upfront radiation therapy with long-term androgen deprivation. We had an extensive discussion reviewing the  pros and cons of each approach. After he had seen both Dr. Tammi Klippel and Dr. Alen Blew, he did make the decision that he wishes to proceed with primary surgical therapy. He has reasonable expectations and understands this is likely the first step in his treatment and very well may not be curative by itself. We also had a detailed discussion regarding the fact that he will need undergoing non-nerve sparing prostatectomy and the significant impact this will have on his erectile function.   He will plan to proceed with a non-nerve sparing robot-assisted laparoscopic radical prostatectomy and bilateral pelvic lymphadenectomy.   Cc: Dr. [name]  Dr. Tyler Pita  [name]  [name]

## 2017-04-29 NOTE — Progress Notes (Signed)
Radiation Oncology         [phone] ________________________________  Multidisciplinary Prostate Cancer Clinic  Initial Radiation Oncology Consultation  Name: [name] MRN: [id number]  Date: 04/29/2017  DOB: 01-06-1956  DX:IPJA, Mark Ready, MD  [name], MD   REFERRING PHYSICIAN: [name], MD  DIAGNOSIS: 62 y.o. gentleman with stage T1c adenocarcinoma of the prostate with a Gleason's score of 4+5 and a PSA of 15.6    ICD-10-CM   1. Malignant neoplasm of prostate ([place]) [name] is a 62 y.o. gentleman.  He was noted to have an elevated PSA of 11.72 by his primary care physician, Dr. [name].  Accordingly, he was referred for evaluation in urology by Dr. Jeffie Pollock on 03/09/17,  digital rectal examination was performed at that time revealing a firm prostate.  PSA was repeated at that time as well and was further elevated at 15.60. The patient proceeded to transrectal ultrasound with 12 biopsies of the prostate on 03/31/17.  The prostate volume measured 35 cc.  Out of 12 core biopsies,all but 1 were positive.  The maximum Gleason score was 4+5, and this was seen in left mid lateral and left seminal vesicle. Additionally, Gleason 4+4 was seen in the left base lateral, 3+5 in the left mid, 4+3 in the left base, left apex lateral and right base lateral and 3+4 in the left and right apex, right apex lateral, right mid and right base.  The patient reviewed the biopsy results with his urologist and he has kindly been referred today to the multidisciplinary prostate cancer clinic for presentation of pathology and radiology studies in our conference for discussion of potential radiation treatment options and clinical evaluation.   On 04/12/17 patient underwent an Axumin PET scan, read by Dr. Suzy Bouchard, which demonstrated intense radiotracer activity within the prostate gland consistent with prostate adenocarcinoma but no  evidence of metastatic diease.     PREVIOUS RADIATION THERAPY: No  PAST MEDICAL HISTORY:  has a past medical history of Arthritis and Prostate cancer ([place]).    PAST SURGICAL HISTORY: Past Surgical History:  Procedure Laterality Date  . COLONOSCOPY N/A 09/13/2012   Procedure: COLONOSCOPY;  Surgeon: Danie Binder, MD;  Location: [facility];  Service: Endoscopy;  Laterality: N/A;  9:30 AM  . HERNIA REPAIR     20 years ago  . KNEE ARTHROSCOPY Left    15 years ago  . SHOULDER ARTHROSCOPY Right    15 years ago    FAMILY HISTORY: family history includes Cancer in his mother.  SOCIAL HISTORY:  reports that  has never smoked. he has never used smokeless tobacco. He reports that he does not drink alcohol or use drugs.  ALLERGIES: Patient has no known allergies.  MEDICATIONS:  Current Outpatient Medications  Medication Sig Dispense Refill  . Naproxen Sodium (ALEVE) 220 MG CAPS Take 2 capsules by mouth daily as needed (headache/pain).    . traMADol (ULTRAM) 50 MG tablet Take 50 mg by mouth every 8 (eight) hours as needed.  0   No current facility-administered medications for this encounter.     REVIEW OF SYSTEMS:  On review of systems, the patient reports that he is doing well overall. He denies any chest pain, shortness of breath, cough, fevers, chills, night sweats, unintended weight changes. He denies any bowel disturbances, and denies abdominal pain, nausea or vomiting. He denies any new musculoskeletal or joint aches or pains. His IPSS was  17, indicating moderate urinary symptoms. He is able to complete sexual activity. A complete review of systems is obtained and is otherwise negative.   PHYSICAL EXAM:  Wt Readings from Last 3 Encounters:  04/29/17 189 lb (85.7 kg)  05/17/16 190 lb (86.2 kg)  09/13/12 190 lb (86.2 kg)   Temp Readings from Last 3 Encounters:  04/29/17 98 F (36.7 C)  09/13/12 97.5 F (36.4 C) (Oral)   BP Readings from Last 3 Encounters:  04/29/17 (!)  157/79  09/13/12 112/75   Pulse Readings from Last 3 Encounters:  04/29/17 63  09/13/12 70   Pain Assessment Pain Score: 0-No pain/10  In general this is a well appearing gentleman in no acute distress. He is alert and oriented x4 and appropriate throughout the examination. HEENT reveals that the patient is normocephalic, atraumatic. EOMs are intact. PERRLA. Skin is intact without any evidence of gross lesions. Cardiovascular exam reveals a regular rate and rhythm, no clicks rubs or murmurs are auscultated. Chest is clear to auscultation bilaterally. Lymphatic assessment is performed and does not reveal any adenopathy in the cervical, supraclavicular, axillary, or inguinal chains. Abdomen has active bowel sounds in all quadrants and is intact. The abdomen is soft, non tender, non distended. Lower extremities are negative for pretibial pitting edema, deep calf tenderness, cyanosis or clubbing.  KPS = 100   100 - Normal; no complaints; no evidence of disease. 90   - Able to carry on normal activity; minor signs or symptoms of disease. 80   - Normal activity with effort; some signs or symptoms of disease. 10   - Cares for self; unable to carry on normal activity or to do active work. 60   - Requires occasional assistance, but is able to care for most of his personal needs. 50   - Requires considerable assistance and frequent medical care. 64   - Disabled; requires special care and assistance. 68   - Severely disabled; hospital admission is indicated although death not imminent. 48   - Very sick; hospital admission necessary; active supportive treatment necessary. 10   - Moribund; fatal processes progressing rapidly. 0     - Dead  Karnofsky DA, Abelmann WH, Craver LS and Burchenal JH [phone]) The use of the nitrogen mustards in the palliative treatment of carcinoma: with particular reference to bronchogenic carcinoma Cancer 1 [phone]   LABORATORY DATA:  No results found for: WBC, HGB, HCT,  MCV, PLT No results found for: NA, K, CL, CO2 No results found for: ALT, AST, GGT, ALKPHOS, BILITOT   RADIOGRAPHY: Nm Pet (axumin) Skull Base To Mid Thigh  Result Date: 04/13/2017 CLINICAL DATA:  Prostate carcinoma with biochemical recurrence. PSA equal 15.6 on 03/09/2017. Gleason 9 carcinoma. EXAM: NUCLEAR MEDICINE PET SKULL BASE TO THIGH TECHNIQUE: 10.5 mCi F-18 Fluciclovine was injected intravenously. Full-ring PET imaging was performed from the skull base to thigh after the radiotracer. CT data was obtained and used for attenuation correction and anatomic localization. COMPARISON:  None. FINDINGS: NECK No radiotracer activity in neck lymph nodes. CHEST No radiotracer accumulation within mediastinal or hilar lymph nodes. No suspicious pulmonary nodules on the CT scan. ABDOMEN/PELVIS Intense radiotracer activity within the prostate gland mildly asymmetric activity in the LEFT gland slightly larger than RIGHT. SUV max equals 6.2. No radiotracer activity within pelvic lymph nodes. No radiotracer activity in periaortic lymph nodes. No enlarged lymph nodes identified. Physiologic activity noted within the liver and pancreas. SKELETON No focal  activity to suggest skeletal metastasis. IMPRESSION:  1. Intense radiotracer activity within the prostate gland consistent with prostate carcinoma. 2. No evidence of metastatic pelvic or abdominal lymph nodes. 3. No evidence of distant metastatic disease or skeletal disease. Electronically Signed   By: Suzy Bouchard M.D.   On: 04/13/2017 08:55      IMPRESSION/PLAN: 62 y.o. gentleman with a high risk, stage T1c adenocarcinoma of the prostate with a PSA of 15.6 and a Gleason score of 4+5.    We discussed the patient's workup and outlines the nature of prostate cancer in this setting. The patient's T stage, Gleason's score, and PSA put him into the high risk group. Accordingly, he is eligible for a variety of potential treatment options including prostatectomy, or  LT-ADT in combination with either 8 weeks of external radiation or 5 weeks of external radiation followed by a brachytherapy boost. We discussed the available radiation techniques, and focused on the details and logistics and delivery.  We discussed and outlined the risks, benefits, short and long-term effects associated with radiotherapy and compared and contrasted these with prostatectomy. He is not an ideal candidate for seed boost bue to his pronounced LUTS despite Flomax.  We discussed the role of SpaceOAR in reducing the rectal toxicity associated with radiotherapy. We also detailed the role of ADT in the treatment of high risk prostate cancer and outlined the associated side effects that could be expected with this therapy.  He was encouraged to ask questions that were answered to his stated satisfaction.  At the end of the conversation the patient is interested in moving forward with scheduling prostatectomy.  He will meet with Dr. Alinda Money today as well to discuss this option further.  We briefly discussed the potential role for adjuvant radiotherapy following prostatectomy in the event that he has unfavorable pathology or a rising PSA post-prostatectomy.  He appears to have a good understanding of his disease and treatment recommendations.    We enjoyed meeting with this patient today and would be happy to continue to participate in his care in the future if clinically indicated.     Nicholos Johns, PA-C    Tyler Pita, MD  [facility] Direct Dial: [phone]  Fax: [phone] [email or web address]  Skype  [email or web address]    This document serves as a record of services personally performed by Tyler Pita, MD and Ashlyn Bruning PA-C. It was created on their behalf by Delton Coombes, a trained medical scribe. The creation of this record is based on the scribe's personal observations and the provider's statements to them.

## 2017-04-29 NOTE — Progress Notes (Signed)
                               Care Plan Summary  Name: [name] DOB: 01-11-1956   Your Medical Team:   Urologist -  Dr. [name], [facility]  Radiation Oncologist - Dr. Tyler Pita, [facility]   Medical Oncologist - Dr. [name], [place]  Recommendations: 1) Robotic prostatectomy 2) Radiation- 8 weeks with androgen deprivation          (hormone injections)  3) Radiation- 5 weeks with seed implant with androgen deprivation (hormone injections)  * These recommendations are based on information available as of today's consult.      Recommendations may change depending on the results of further tests or exams.  Next Steps: 1)You have chosen robotic prostatectomy as treatment. Dr. Lynne Logan office will to  schedule surgery.  When appointments need to be scheduled, you will be contacted by [facility] and/or [facility].  Patient provided with a copy of "Fall Prevention Patient Safety Plan" and business cards of all all providers.  Questions?  Please do not hesitate to call Cira Rue, RN, BSN, OCN at [phone] any questions or concerns.  Shirlean Mylar is your Oncology Nurse Navigator and is available to assist you while you're receiving your medical care at [facility].

## 2017-04-29 NOTE — Progress Notes (Signed)
Reason for Referral: Prostate cancer.  HPI: 62 year old gentleman currently of [place] where he lived the majority of his life.  He is in reasonably good health without any significant comorbid conditions.  He started to develop voiding symptoms reported to his primary care physician including frequency, urgency and nocturia.  He was evaluated by Dr. Jeffie Pollock and a PSA obtained at the time and found to be elevated at 15.6.  He subsequently underwent a digital rectal examination which was concerning for a firm prostate.  Biopsy obtained in January 2019 which showed high-grade high-volume disease including Gleason score 4+5 = 9 involving the seminal vesicle.  He had also for +4 equals 8 as well as 4+3 = 7 patterns and 11 out of 12 cores.  Based on these findings, he underwent staging workup with PET imaging which showed no evidence of metastatic disease.  He was referred to the prostate cancer multidisciplinary clinic for further discussion.  Clinically, he continues to have urinary symptoms including urgency and frequency and incomplete emptying.  He denies any hematuria or dysuria.  Despite the symptoms he remains active including ministering a church and exercises regularly.   He does not report any headaches, blurry vision, syncope or seizures. Does not report any fevers, chills or sweats.  Does not report any cough, wheezing or hemoptysis.  Does not report any chest pain, palpitation, orthopnea or leg edema.  Does not report any nausea, vomiting or abdominal pain.  She does not report any constipation or diarrhea.  Does not report any skeletal complaints.  Does not report any lymphadenopathy or petechiae.  Does not report any anxiety or depression.  Remaining review of systems is negative.    Past Medical History:  Diagnosis Date  . Arthritis   . Prostate cancer [facility])   :  Past Surgical History:  Procedure Laterality Date  . COLONOSCOPY N/A 09/13/2012   Procedure: COLONOSCOPY;   Surgeon: Danie Binder, MD;  Location: [facility];  Service: Endoscopy;  Laterality: N/A;  9:30 AM  . HERNIA REPAIR     20 years ago  . KNEE ARTHROSCOPY Left    15 years ago  . SHOULDER ARTHROSCOPY Right    15 years ago  :   Current Outpatient Medications:  .  Naproxen Sodium (ALEVE) 220 MG CAPS, Take 2 capsules by mouth daily as needed (headache/pain)., Disp: , Rfl:  .  traMADol (ULTRAM) 50 MG tablet, Take 50 mg by mouth every 8 (eight) hours as needed., Disp: , Rfl: 0:  No Known Allergies:  Family History  Problem Relation Age of Onset  . Cancer Mother        small cell lung  :  Social History   Socioeconomic History  . Marital status: Married    Spouse name: Not on file  . Number of children: Not on file  . Years of education: Not on file  . Highest education level: Not on file  Social Needs  . Financial resource strain: Not on file  . Food insecurity - worry: Not on file  . Food insecurity - inability: Not on file  . Transportation needs - medical: Not on file  . Transportation needs - non-medical: Not on file  Occupational History  . Occupation: pastor  Tobacco Use  . Smoking status: Never Smoker  . Smokeless tobacco: Never Used  Substance and Sexual Activity  . Alcohol use: No  . Drug use: No  . Sexual activity: Not on file  Other Topics  Concern  . Not on file  Social History Narrative   Doristine Bosworth   Married to [employer]   Daughter, Abby Gigi Gin, RN III, Resides in [place]  :  Pertinent items are noted in HPI.  Exam: ECOG 0 General appearance: alert and cooperative appeared without distress. Eyes: conjunctivae/corneas clear. PERRL.  Throat: No thrush or ulcers.  Mucous membranes are moist and pink. Resp: clear to auscultation bilaterally without rhonchi or wheezes. Cardio: regular rate and rhythm, S1, S2 normal, no murmur, click, rub or gallop GI: soft, non-tender; bowel sounds normal; no masses,  no organomegaly Skin: Skin color,  texture, turgor normal. No rashes or lesions Lymph nodes: Cervical, supraclavicular, and axillary nodes normal. Neurologic: Grossly normal without any motor or sensory deficits.     Nm Pet (axumin) Skull Base To Mid Thigh  Result Date: 04/13/2017 CLINICAL DATA:  Prostate carcinoma with biochemical recurrence. PSA equal 15.6 on 03/09/2017. Gleason 9 carcinoma. EXAM: NUCLEAR MEDICINE PET SKULL BASE TO THIGH TECHNIQUE: 10.5 mCi F-18 Fluciclovine was injected intravenously. Full-ring PET imaging was performed from the skull base to thigh after the radiotracer. CT data was obtained and used for attenuation correction and anatomic localization. COMPARISON:  None. FINDINGS: NECK No radiotracer activity in neck lymph nodes. CHEST No radiotracer accumulation within mediastinal or hilar lymph nodes. No suspicious pulmonary nodules on the CT scan. ABDOMEN/PELVIS Intense radiotracer activity within the prostate gland mildly asymmetric activity in the LEFT gland slightly larger than RIGHT. SUV max equals 6.2. No radiotracer activity within pelvic lymph nodes. No radiotracer activity in periaortic lymph nodes. No enlarged lymph nodes identified. Physiologic activity noted within the liver and pancreas. SKELETON No focal  activity to suggest skeletal metastasis. IMPRESSION: 1. Intense radiotracer activity within the prostate gland consistent with prostate carcinoma. 2. No evidence of metastatic pelvic or abdominal lymph nodes. 3. No evidence of distant metastatic disease or skeletal disease. Electronically Signed   By: Suzy Bouchard M.D.   On: 04/13/2017 08:55    Assessment and Plan:   62 year old gentleman with locally advanced prostate cancer diagnosed in January 2019.  He was found to have a PSA 15.6 and a Gleason score of 4+5 = 9 in 11 out of 12 cores including seminal vesicles.  His imaging studies did not show any evidence of metastatic disease.  The natural course of this disease was reviewed today with  the patient and his family.  His pathology and radiology was discussed with the reviewing pathologist and radiologist respectively.  He understands he has an aggressive malignancy that is locally advanced although potentially curable the odds are low.  Curative options include primary surgical therapy as a part of a tri-modality approach utilizing adjuvant radiation and androgen deprivation therapy.  Definitive radiation therapy and ADT is also a possibility.  The rationale for using androgen deprivation therapy was discussed today with the patient and his family.  Potential complications that include weight gain, hot flashes, osteoporosis and erectile dysfunction were reviewed.  Duration of this therapy would be close to 2 years unless he develops advanced disease.  The potential for developing advanced prostate cancer was also reviewed and treatment modalities at that time were discussed and he understands at the time these are palliative options rather than curative options.  All his questions were answered today to his satisfaction.  30  minutes was spent with the patient face-to-face today.  More than 50% of time was dedicated to patient counseling, education and coordination of his multifaceted care.

## 2017-05-02 ENCOUNTER — Telehealth: Payer: Self-pay

## 2017-05-02 NOTE — Telephone Encounter (Signed)
Per 2/8 no los °

## 2017-05-03 ENCOUNTER — Telehealth: Payer: Self-pay | Admitting: Medical Oncology

## 2017-05-03 ENCOUNTER — Other Ambulatory Visit: Payer: Self-pay | Admitting: Urology

## 2017-05-03 NOTE — Telephone Encounter (Signed)
Mr. Lipsett and his wife Shauna Hugh called stating they have not heard about a surgery date for his robotic prostatectomy. He called Dr. Lynne Logan office and he was asked if he had made his PT appointment. He was not aware he had to make these appointments. I explained that [facility] should call him with these dates once Dr. Alinda Money submits the request to do surgery. He was given a pre-op PT appointment for 05/10/17 but not a surgery date. He is anxious because he was told not to delay treatment. I will follow up with Dr. Alinda Money and get back with him. He is aware that his surgery date may not be until March.

## 2017-05-05 ENCOUNTER — Telehealth: Payer: Self-pay | Admitting: Medical Oncology

## 2017-05-05 NOTE — Telephone Encounter (Signed)
Left a message regarding surgery date for prostatectomy. He is scheduled for 3/11 and I asked him to call me if he has not been contacted.

## 2017-05-09 ENCOUNTER — Telehealth: Payer: Self-pay | Admitting: Medical Oncology

## 2017-05-09 NOTE — Telephone Encounter (Signed)
Diane-wife called asking about PT appointment time for tomorrow. I returned call and spoke with Mr. Dicostanzo to inform him it is am at [facility]. He voiced understanding.

## 2017-05-10 DIAGNOSIS — M6281 Muscle weakness (generalized): Secondary | ICD-10-CM | POA: Diagnosis not present

## 2017-05-10 DIAGNOSIS — C61 Malignant neoplasm of prostate: Secondary | ICD-10-CM | POA: Diagnosis not present

## 2017-05-17 DIAGNOSIS — M6281 Muscle weakness (generalized): Secondary | ICD-10-CM | POA: Diagnosis not present

## 2017-05-17 DIAGNOSIS — M62838 Other muscle spasm: Secondary | ICD-10-CM | POA: Diagnosis not present

## 2017-05-17 DIAGNOSIS — N393 Stress incontinence (female) (male): Secondary | ICD-10-CM | POA: Diagnosis not present

## 2017-05-18 ENCOUNTER — Encounter: Payer: Self-pay | Admitting: *Deleted

## 2017-05-25 ENCOUNTER — Encounter (HOSPITAL_COMMUNITY): Payer: Self-pay

## 2017-05-25 NOTE — Pre-Procedure Instructions (Signed)
Last office visit note Dr. Alen Blew 04/29/2017 in epic.

## 2017-05-25 NOTE — Patient Instructions (Signed)
Your procedure is scheduled on: Monday, May 30, 2017   Surgery Time:  7:15AM-10:45AM   Report to [facility] by 5:30 AM pick up the phone at the front desk dial [phone], have a seat in the lobby and a staff member will come and escort you to Short Stay Department   Call this number if you have problems the morning of surgery [phone]   8 OZ MAGNESIUM CITRATE BY NOON DAY BEFORE SURGERY   Fleet Enema: NIGHT BEFORE SURGERY   Do not eat food or drink liquids :After Midnight.   Do NOT smoke after Midnight   Take these medicines the morning of surgery with A SIP OF WATER: None                               You may not have any metal on your body including  jewelry, and body piercings             Do not wear lotions, powders, perfumes/cologne, or deodorant                          Men may shave face and neck.   Do not bring valuables to the hospital. [place].   Contacts, dentures or bridgework may not be worn into surgery.   Leave suitcase in the car. After surgery it may be brought to your room.   Special Instructions: Bring a copy of your healthcare power of attorney and living will documents         the day of surgery if you haven't scanned them in before.              Please read over the following fact sheets you were given:  [facility] - Preparing for Surgery Before surgery, you can play an important role.  Because skin is not sterile, your skin needs to be as free of germs as possible.  You can reduce the number of germs on your skin by washing with CHG (chlorahexidine gluconate) soap before surgery.  CHG is an antiseptic cleaner which kills germs and bonds with the skin to continue killing germs even after washing. Please DO NOT use if you have an allergy to CHG or antibacterial soaps.  If your skin becomes reddened/irritated stop using the CHG and inform your nurse when you arrive  at Short Stay. Do not shave (including legs and underarms) for at least 48 hours prior to the first CHG shower.  You may shave your face/neck.  Please follow these instructions carefully:  1.  Shower with CHG Soap the night before surgery and the  morning of surgery.  2.  If you choose to wash your hair, wash your hair first as usual with your normal  shampoo.  3.  After you shampoo, rinse your hair and body thoroughly to remove the shampoo.                             4.  Use CHG as you would any other liquid soap.  You can apply chg directly to the skin and wash.  Gently with a scrungie or clean washcloth.  5.  Apply the CHG Soap to your body  ONLY FROM THE NECK DOWN.   Do   not use on face/ open                           Wound or open sores. Avoid contact with eyes, ears mouth and   genitals (private parts).                       Wash face,  Genitals (private parts) with your normal soap.             6.  Wash thoroughly, paying special attention to the area where your    surgery  will be performed.  7.  Thoroughly rinse your body with warm water from the neck down.  8.  DO NOT shower/wash with your normal soap after using and rinsing off the CHG Soap.                9.  Pat yourself dry with a clean towel.            10.  Wear clean pajamas.            11.  Place clean sheets on your bed the night of your first shower and do not  sleep with pets. Day of Surgery : Do not apply any lotions/deodorants the morning of surgery.  Please wear clean clothes to the hospital/surgery center.  FAILURE TO FOLLOW THESE INSTRUCTIONS MAY RESULT IN THE CANCELLATION OF YOUR SURGERY  PATIENT SIGNATURE_________________________________  NURSE SIGNATURE__________________________________  ________________________________________________________________________   Adam Phenix  An incentive spirometer is a tool that can help keep your lungs clear and active. This tool measures how well you are filling  your lungs with each breath. Taking long deep breaths may help reverse or decrease the chance of developing breathing (pulmonary) problems (especially infection) following:  A long period of time when you are unable to move or be active. BEFORE THE PROCEDURE   If the spirometer includes an indicator to show your best effort, your nurse or respiratory therapist will set it to a desired goal.  If possible, sit up straight or lean slightly forward. Try not to slouch.  Hold the incentive spirometer in an upright position. INSTRUCTIONS FOR USE  1. Sit on the edge of your bed if possible, or sit up as far as you can in bed or on a chair. 2. Hold the incentive spirometer in an upright position. 3. Breathe out normally. 4. Place the mouthpiece in your mouth and seal your lips tightly around it. 5. Breathe in slowly and as deeply as possible, raising the piston or the ball toward the top of the column. 6. Hold your breath for 3-5 seconds or for as long as possible. Allow the piston or ball to fall to the bottom of the column. 7. Remove the mouthpiece from your mouth and breathe out normally. 8. Rest for a few seconds and repeat Steps 1 through 7 at least 10 times every 1-2 hours when you are awake. Take your time and take a few normal breaths between deep breaths. 9. The spirometer may include an indicator to show your best effort. Use the indicator as a goal to work toward during each repetition. 10. After each set of 10 deep breaths, practice coughing to be sure your lungs are clear. If you have an incision (the cut made at the time of surgery), support your incision when coughing by placing a pillow or  rolled up towels firmly against it. Once you are able to get out of bed, walk around indoors and cough well. You may stop using the incentive spirometer when instructed by your caregiver.  RISKS AND COMPLICATIONS  Take your time so you do not get dizzy or light-headed.  If you are in pain, you may  need to take or ask for pain medication before doing incentive spirometry. It is harder to take a deep breath if you are having pain. AFTER USE  Rest and breathe slowly and easily.  It can be helpful to keep track of a log of your progress. Your caregiver can provide you with a simple table to help with this. If you are using the spirometer at home, follow these instructions: [place] IF:   You are having difficultly using the spirometer.  You have trouble using the spirometer as often as instructed.  Your pain medication is not giving enough relief while using the spirometer.  You develop fever of 100.5 F (38.1 C) or higher. SEEK IMMEDIATE MEDICAL CARE IF:   You cough up bloody sputum that had not been present before.  You develop fever of 102 F (38.9 C) or greater.  You develop worsening pain at or near the incision site. MAKE SURE YOU:   Understand these instructions.  Will watch your condition.  Will get help right away if you are not doing well or get worse. Document Released: 07/19/2006 Document Revised: 05/31/2011 Document Reviewed: 09/19/2006 Exit[employer] Patient Information 2014 ExitCare, [place].   ________________________________________________________________________  WHAT IS A BLOOD TRANSFUSION? Blood Transfusion Information  A transfusion is the replacement of blood or some of its parts. Blood is made up of multiple cells which provide different functions.  Red blood cells carry oxygen and are used for blood loss replacement.  White blood cells fight against infection.  Platelets control bleeding.  Plasma helps clot blood.  Other blood products are available for specialized needs, such as hemophilia or other clotting disorders. BEFORE THE TRANSFUSION  Who gives blood for transfusions?   Healthy volunteers who are fully evaluated to make sure their blood is safe. This is blood bank blood. Transfusion therapy is the safest it has ever been in  the practice of medicine. Before blood is taken from a donor, a complete history is taken to make sure that person has no history of diseases nor engages in risky social behavior (examples are intravenous drug use or sexual activity with multiple partners). The donor's travel history is screened to minimize risk of transmitting infections, such as malaria. The donated blood is tested for signs of infectious diseases, such as HIV and hepatitis. The blood is then tested to be sure it is compatible with you in order to minimize the chance of a transfusion reaction. If you or a relative donates blood, this is often done in anticipation of surgery and is not appropriate for emergency situations. It takes many days to process the donated blood. RISKS AND COMPLICATIONS Although transfusion therapy is very safe and saves many lives, the main dangers of transfusion include:   Getting an infectious disease.  Developing a transfusion reaction. This is an allergic reaction to something in the blood you were given. Every precaution is taken to prevent this. The decision to have a blood transfusion has been considered carefully by your caregiver before blood is given. Blood is not given unless the benefits outweigh the risks. AFTER THE TRANSFUSION  Right after receiving a blood transfusion, you will usually  feel much better and more energetic. This is especially true if your red blood cells have gotten low (anemic). The transfusion raises the level of the red blood cells which carry oxygen, and this usually causes an energy increase.  The nurse administering the transfusion will monitor you carefully for complications. HOME CARE INSTRUCTIONS  No special instructions are needed after a transfusion. You may find your energy is better. Speak with your caregiver about any limitations on activity for underlying diseases you may have. SEEK MEDICAL CARE IF:   Your condition is not improving after your  transfusion.  You develop redness or irritation at the intravenous (IV) site. SEEK IMMEDIATE MEDICAL CARE IF:  Any of the following symptoms occur over the next 12 hours:  Shaking chills.  You have a temperature by mouth above 102 F (38.9 C), not controlled by medicine.  Chest, back, or muscle pain.  People around you feel you are not acting correctly or are confused.  Shortness of breath or difficulty breathing.  Dizziness and fainting.  You get a rash or develop hives.  You have a decrease in urine output.  Your urine turns a dark color or changes to pink, red, or brown. Any of the following symptoms occur over the next 10 days:  You have a temperature by mouth above 102 F (38.9 C), not controlled by medicine.  Shortness of breath.  Weakness after normal activity.  The white part of the eye turns yellow (jaundice).  You have a decrease in the amount of urine or are urinating less often.  Your urine turns a dark color or changes to pink, red, or brown. Document Released: 03/05/2000 Document Revised: 05/31/2011 Document Reviewed: 10/23/2007 [facility] Patient Information 2014 [place], [place].  _______________________________________________________________________

## 2017-05-26 ENCOUNTER — Encounter (HOSPITAL_COMMUNITY)
Admission: RE | Admit: 2017-05-26 | Discharge: 2017-05-26 | Disposition: A | Discharge status: Home / Self Care | Payer: BLUE CROSS/BLUE SHIELD | Source: Ambulatory Visit | Attending: Urology | Admitting: Urology

## 2017-05-26 ENCOUNTER — Other Ambulatory Visit: Payer: Self-pay

## 2017-05-26 ENCOUNTER — Encounter (HOSPITAL_COMMUNITY): Payer: Self-pay

## 2017-05-26 DIAGNOSIS — Z01812 Encounter for preprocedural laboratory examination: Secondary | ICD-10-CM | POA: Diagnosis not present

## 2017-05-26 HISTORY — DX: Other hemorrhoids: K64.8

## 2017-05-26 HISTORY — DX: Bilateral primary osteoarthritis of knee: M17.0

## 2017-05-26 HISTORY — DX: Diverticulosis of large intestine without perforation or abscess without bleeding: K57.30

## 2017-05-26 HISTORY — DX: Prediabetes: R73.03

## 2017-05-26 LAB — BASIC METABOLIC PANEL
Anion gap: 8 (ref 5–15)
BUN: 14 mg/dL (ref 6–20)
CHLORIDE: 105 mmol/L (ref 101–111)
CO2: 28 mmol/L (ref 22–32)
Calcium: 9.1 mg/dL (ref 8.9–10.3)
Creatinine, Ser: 1.07 mg/dL (ref 0.61–1.24)
GFR calc non Af Amer: >60 mL/min (ref >60)
Glucose, Bld: 258 mg/dL — ABNORMAL HIGH (ref 65–99)
POTASSIUM: 4.9 mmol/L (ref 3.5–5.1)
Sodium: 141 mmol/L (ref 135–145)

## 2017-05-26 LAB — HEMOGLOBIN A1C
HEMOGLOBIN A1C: 7.1 % — AB (ref 4.8–5.6)
MEAN PLASMA GLUCOSE: 157.07 mg/dL

## 2017-05-26 LAB — CBC
HEMATOCRIT: 45.9 % (ref 39.0–52.0)
HEMOGLOBIN: 16.4 g/dL (ref 13.0–17.0)
MCH: 31.4 pg (ref 26.0–34.0)
MCHC: 35.7 g/dL (ref 30.0–36.0)
MCV: 87.9 fL (ref 78.0–100.0)
PLATELETS: 129 10*3/uL — AB (ref 150–400)
RBC: 5.22 MIL/uL (ref 4.22–5.81)
RDW: 12 % (ref 11.5–15.5)
WBC: 5.5 10*3/uL (ref 4.0–10.5)

## 2017-05-26 LAB — ABO/RH: ABO/RH(D): O POS

## 2017-05-26 MED ORDER — MAGNESIUM CITRATE PO SOLN
1.0000 | Freq: Once | ORAL | Status: DC
  Filled 2017-05-26: qty 296

## 2017-05-26 MED ORDER — FLEET ENEMA 7-19 GM/118ML RE ENEM
1.0000 | ENEMA | Freq: Once | RECTAL | Status: DC
  Filled 2017-05-26: qty 1

## 2017-05-26 NOTE — Pre-Procedure Instructions (Addendum)
Dr. Therisa Doyne made aware of platlets 129 , Glucose 258, and Hgb A1C 7.1.  Dr. Ola Spurr wants a CBG done in Short stay day of surgery. CBC, BMP, and HGBA1C faxed to Dr. Alinda Money via epic.

## 2017-05-27 NOTE — H&P (Signed)
CC/HPI: CC: Prostate Cancer     [name] is a 62 year old gentleman who was noted to have an elevated PSA of 15.6 and a diffusely firm prostate. He underwent a TRUS biopsy of the prostate on 03/31/17 that indicated Gleason 4+5=9 adenocarcinoma with 12 out of 13 biopsy cores positive including 90% of a biopsy of the left seminal vesicle.   Family history: None.   Imaging studies: He did undergo a fluciclovine PET scan. This did demonstrate a avid uptake throughout the entire prostate without evidence of any obvious metastatic disease.   PMH: He has a history of hyperglycemia.  PSH: Right inguinal hernia repair   TNM stage: cT3b N0 M0  PSA: 15.6  Gleason score: 4+5=9  Biopsy (03/31/17): 12/13 cores positive  Left: L lateral apex (80%, 4+3=7, PNI), L apex (60%, 3+4=7, PNI), L lateral mid (90%, 4+5=9, PNI), L mid (90%, 3+5=8, PNI), L lateral base (80%, 4+4=8, PNI), L base (90%, 4+3=7)  Right: R apex (50%, 3+4=7), R lateral apex (30%, 3+4=7), R mid (5%, 3+4=7), R base (70%, 3+4=7, PNI), R lateral base (10%, 4+3=7)  SV: L SV (90%, 4+5=9, PNI)  Prostate volume: 35.0 cc   Nomogram  OC disease: 3%  EPE: 96%  SVI: 74%  LNI: 71%  PFS (5 year, 10 year): 10%,6%   Urinary function: IPSS is 17.  Erectile function: SHIM score is 25.     ALLERGIES: None   MEDICATIONS: Levaquin 750 mg tablet 1 tablet 1 hour prior to procedure  Tamsulosin Hcl 0.4 mg capsule 1 capsule PO Daily  Aleve PRN  Diazepam 10 mg tablet 1-2 tablet PO Daily Take one hour prior to procedure.  Tramadol Hcl 50 mg tablet 1 tablet PO Q 6 H PRN     GU PSH: Prostate Needle Biopsy - 03/31/2017    NON-GU PSH: Incisional hernia repair (open) Knee replacement, Bilateral Shoulder Surgery (Unspecified), Right Surgical Pathology, Gross And Microscopic Examination For Prostate Needle - 03/31/2017    GU PMH: Elevated PSA - 03/31/2017, He had an elevated PSA with possible prostatitis. His symptoms are a little better post  bactrim but his prostate is diffusely firm. I am going to repeat a PSA today and if it is not falling, I will have him return for a prostate [place] and biopsy. I reviewed the risks of the biopsy including bleeding, infection and voiding difficulty. If the PSA is falling, I will give him a month of antibiotics and recheck him. , - 03/09/2017 Disorder of male genital organs, unspecified (Worsening, Chronic), Right, Ketorolac 30 mg IM today. Continue with good scrotal support. SMZ-TMP DS 1 po BID X 10 days. - 03/18/2017 BPH w/LUTS, He has persistent LUTS with an IPSS of 21. I am going to resume the tamsulosin and will have him return for a flowrate and PVR. - 03/09/2017 Right testicular pain, He has right testicular pain that may be referred from the right groin. He is tender in the groin and could have a stain. I have recommended aleve 2 tabs bid for a couple of weeks. - 03/09/2017 Weak Urinary Stream - 03/09/2017    NON-GU PMH: Glycosuria, He has 2+ glucose today and has had borderline hyperglycemia. - 03/09/2017 Arthritis    FAMILY HISTORY: Cancer - Mother Death of family member - Mother, Father    Notes: 1 daughter (adopted)   SOCIAL HISTORY: Marital Status: Married Preferred Language: English; Ethnicity: Not Hispanic Or Latino; Race: White Current Smoking Status: Patient has never  smoked.   Tobacco Use Assessment Completed: Used Tobacco in last 30 days? Does not use smokeless tobacco. Has never drank.  Drinks 3 caffeinated drinks per day. Patient's occupation Engineer, maintenance.    REVIEW OF SYSTEMS:    GU Review Male:   Patient denies frequent urination, hard to postpone urination, burning/ pain with urination, get up at night to urinate, leakage of urine, stream starts and stops, trouble starting your streams, and have to strain to urinate .  Gastrointestinal (Lower):   Patient denies diarrhea and constipation.  Gastrointestinal (Upper):   Patient denies nausea and vomiting.  Constitutional:    Patient denies fever, night sweats, weight loss, and fatigue.  Skin:   Patient denies skin rash/ lesion and itching.  Eyes:   Patient denies blurred vision and double vision.  Ears/ Nose/ Throat:   Patient denies sore throat and sinus problems.  Hematologic/Lymphatic:   Patient denies swollen glands and easy bruising.  Cardiovascular:   Patient denies leg swelling and chest pains.  Respiratory:   Patient denies cough and shortness of breath.  Endocrine:   Patient denies excessive thirst.  Musculoskeletal:   Patient denies back pain and joint pain.  Neurological:   Patient denies headaches and dizziness.  Psychologic:   Patient denies depression and anxiety.     MULTI-SYSTEM PHYSICAL EXAMINATION:    Constitutional: Well-nourished. No physical deformities. Normally developed. Good grooming.  Neck: Neck symmetrical, not swollen. Normal tracheal position.  Respiratory: No labored breathing, no use of accessory muscles. Clear bilaterally.  Cardiovascular: Normal temperature, normal extremity pulses, no swelling, no varicosities. Regular rate and rhythm.  Lymphatic: No enlargement of neck, axillae, groin.  Skin: No paleness, no jaundice, no cyanosis. No lesion, no ulcer, no rash.  Neurologic / Psychiatric: Oriented to time, oriented to place, oriented to person. No depression, no anxiety, no agitation.  Gastrointestinal: No mass, no tenderness, no rigidity, non obese abdomen.  Eyes: Normal conjunctivae. Normal eyelids.  Ears, Nose, Mouth, and Throat: Left ear no scars, no lesions, no masses. Right ear no scars, no lesions, no masses. Nose no scars, no lesions, no masses. Normal hearing. Normal lips.  Musculoskeletal: Normal gait and station of head and neck.       ASSESSMENT:    1 GU:   Prostate Cancer - C61    PLAN:       1. Very high risk prostate cancer: He has elected to proceed with surgical therapy. He will plan to proceed with a non-nerve sparing robot-assisted laparoscopic  radical prostatectomy and bilateral pelvic lymphadenectomy.

## 2017-05-29 NOTE — Anesthesia Preprocedure Evaluation (Signed)
Anesthesia Evaluation  Patient identified by MRN, date of birth, ID band Patient awake    Reviewed: Allergy & Precautions, NPO status , Patient's Chart, lab work & pertinent test results  Airway Mallampati: II  TM Distance: >3 FB Neck ROM: Full    Dental no notable dental hx.    Pulmonary neg pulmonary ROS,    Pulmonary exam normal breath sounds clear to auscultation       Cardiovascular negative cardio ROS Normal cardiovascular exam Rhythm:Regular Rate:Normal     Neuro/Psych negative neurological ROS  negative psych ROS   GI/Hepatic negative GI ROS, Neg liver ROS,   Endo/Other  negative endocrine ROS  Renal/GU negative Renal ROS  negative genitourinary   Musculoskeletal negative musculoskeletal ROS (+)   Abdominal   Peds negative pediatric ROS (+)  Hematology negative hematology ROS (+)   Anesthesia Other Findings   Reproductive/Obstetrics negative OB ROS                             Anesthesia Physical Anesthesia Plan  ASA: II  Anesthesia Plan: General   Post-op Pain Management:    Induction: Intravenous  PONV Risk Score and Plan: 2 and Ondansetron, Dexamethasone, Treatment may vary due to age or medical condition and Midazolam  Airway Management Planned: Oral ETT  Additional Equipment:   Intra-op Plan:   Post-operative Plan: Extubation in OR  Informed Consent: I have reviewed the patients History and Physical, chart, labs and discussed the procedure including the risks, benefits and alternatives for the proposed anesthesia with the patient or authorized representative who has indicated his/her understanding and acceptance.     Plan Discussed with: CRNA and Anesthesiologist  Anesthesia Plan Comments: (  )        Anesthesia Quick Evaluation

## 2017-05-30 ENCOUNTER — Ambulatory Visit (HOSPITAL_COMMUNITY): Payer: BLUE CROSS/BLUE SHIELD | Admitting: Anesthesiology

## 2017-05-30 ENCOUNTER — Other Ambulatory Visit: Payer: Self-pay

## 2017-05-30 ENCOUNTER — Observation Stay (HOSPITAL_COMMUNITY)
Admission: RE | Admit: 2017-05-30 | Discharge: 2017-05-31 | Disposition: A | Discharge status: Home / Self Care | Payer: BLUE CROSS/BLUE SHIELD | Source: Ambulatory Visit | Attending: Urology | Admitting: Urology

## 2017-05-30 ENCOUNTER — Encounter (HOSPITAL_COMMUNITY)
Admission: RE | Disposition: A | Discharge status: Home / Self Care | Payer: Self-pay | Source: Ambulatory Visit | Attending: Urology

## 2017-05-30 ENCOUNTER — Encounter (HOSPITAL_COMMUNITY): Payer: Self-pay | Admitting: Emergency Medicine

## 2017-05-30 DIAGNOSIS — C61 Malignant neoplasm of prostate: Secondary | ICD-10-CM | POA: Diagnosis not present

## 2017-05-30 DIAGNOSIS — M170 Bilateral primary osteoarthritis of knee: Secondary | ICD-10-CM | POA: Diagnosis not present

## 2017-05-30 DIAGNOSIS — C775 Secondary and unspecified malignant neoplasm of intrapelvic lymph nodes: Secondary | ICD-10-CM | POA: Diagnosis not present

## 2017-05-30 DIAGNOSIS — M25562 Pain in left knee: Secondary | ICD-10-CM | POA: Diagnosis not present

## 2017-05-30 DIAGNOSIS — M25561 Pain in right knee: Secondary | ICD-10-CM | POA: Diagnosis not present

## 2017-05-30 HISTORY — PX: LYMPHADENECTOMY: SHX5960

## 2017-05-30 HISTORY — PX: ROBOT ASSISTED LAPAROSCOPIC RADICAL PROSTATECTOMY: SHX5141

## 2017-05-30 LAB — HEMOGLOBIN AND HEMATOCRIT, BLOOD
HCT: 41 % (ref 39.0–52.0)
Hemoglobin: 15 g/dL (ref 13.0–17.0)

## 2017-05-30 LAB — TYPE AND SCREEN
ABO/RH(D): O POS
Antibody Screen: NEGATIVE

## 2017-05-30 LAB — GLUCOSE, CAPILLARY
GLUCOSE-CAPILLARY: 177 mg/dL — AB (ref 65–99)
Glucose-Capillary: 221 mg/dL — ABNORMAL HIGH (ref 65–99)

## 2017-05-30 SURGERY — XI ROBOTIC ASSISTED LAPAROSCOPIC RADICAL PROSTATECTOMY LEVEL 3
Anesthesia: General

## 2017-05-30 MED ORDER — SODIUM CHLORIDE 0.9 % IV BOLUS (SEPSIS)
1000.0000 mL | Freq: Once | INTRAVENOUS | Status: AC
  Administered 2017-05-30: 1000 mL via INTRAVENOUS

## 2017-05-30 MED ORDER — PHENYLEPHRINE 40 MCG/ML (10ML) SYRINGE FOR IV PUSH (FOR BLOOD PRESSURE SUPPORT)
PREFILLED_SYRINGE | INTRAVENOUS | Status: AC
  Filled 2017-05-30: qty 10

## 2017-05-30 MED ORDER — CEFAZOLIN SODIUM-DEXTROSE 1-4 GM/50ML-% IV SOLN
1.0000 g | Freq: Three times a day (TID) | INTRAVENOUS | Status: AC
  Administered 2017-05-30 (×2): 1 g via INTRAVENOUS
  Filled 2017-05-30 (×2): qty 50

## 2017-05-30 MED ORDER — FENTANYL CITRATE (PF) 100 MCG/2ML IJ SOLN
25.0000 ug | INTRAMUSCULAR | Status: DC | PRN
  Administered 2017-05-30 (×3): 50 ug via INTRAVENOUS

## 2017-05-30 MED ORDER — MEPERIDINE HCL 50 MG/ML IJ SOLN: 6.2500 mg | INTRAMUSCULAR | Status: DC | PRN

## 2017-05-30 MED ORDER — OXYCODONE HCL 5 MG/5ML PO SOLN
5.0000 mg | Freq: Once | ORAL | Status: DC | PRN
  Filled 2017-05-30: qty 5

## 2017-05-30 MED ORDER — KETOROLAC TROMETHAMINE 15 MG/ML IJ SOLN
15.0000 mg | Freq: Four times a day (QID) | INTRAMUSCULAR | Status: DC
  Administered 2017-05-30 – 2017-05-31 (×4): 15 mg via INTRAVENOUS
  Filled 2017-05-30 (×4): qty 1

## 2017-05-30 MED ORDER — FENTANYL CITRATE (PF) 250 MCG/5ML IJ SOLN
INTRAMUSCULAR | Status: AC
  Filled 2017-05-30: qty 5

## 2017-05-30 MED ORDER — SUCCINYLCHOLINE CHLORIDE 200 MG/10ML IV SOSY
PREFILLED_SYRINGE | INTRAVENOUS | Status: AC
  Filled 2017-05-30: qty 10

## 2017-05-30 MED ORDER — ACETAMINOPHEN 325 MG PO TABS: 325.0000 mg | ORAL_TABLET | ORAL | Status: DC | PRN

## 2017-05-30 MED ORDER — ONDANSETRON HCL 4 MG/2ML IJ SOLN
INTRAMUSCULAR | Status: AC
  Filled 2017-05-30: qty 2

## 2017-05-30 MED ORDER — PROPOFOL 10 MG/ML IV BOLUS
INTRAVENOUS | Status: DC | PRN
  Administered 2017-05-30: 160 mg via INTRAVENOUS

## 2017-05-30 MED ORDER — ONDANSETRON HCL 4 MG/2ML IJ SOLN
INTRAMUSCULAR | Status: DC | PRN
  Administered 2017-05-30: 4 mg via INTRAVENOUS

## 2017-05-30 MED ORDER — MIDAZOLAM HCL 2 MG/2ML IJ SOLN
INTRAMUSCULAR | Status: AC
  Filled 2017-05-30: qty 2

## 2017-05-30 MED ORDER — HYDROMORPHONE HCL 1 MG/ML IJ SOLN
INTRAMUSCULAR | Status: DC | PRN
  Administered 2017-05-30: 1 mg via INTRAVENOUS
  Administered 2017-05-30 (×2): 0.5 mg via INTRAVENOUS

## 2017-05-30 MED ORDER — HYDROMORPHONE HCL 1 MG/ML IJ SOLN: 0.2500 mg | INTRAMUSCULAR | Status: DC | PRN

## 2017-05-30 MED ORDER — FENTANYL CITRATE (PF) 100 MCG/2ML IJ SOLN
INTRAMUSCULAR | Status: DC | PRN
  Administered 2017-05-30: 100 ug via INTRAVENOUS
  Administered 2017-05-30: 50 ug via INTRAVENOUS
  Administered 2017-05-30: 100 ug via INTRAVENOUS
  Administered 2017-05-30: 50 ug via INTRAVENOUS
  Administered 2017-05-30 (×2): 100 ug via INTRAVENOUS
  Administered 2017-05-30 (×5): 50 ug via INTRAVENOUS

## 2017-05-30 MED ORDER — HYDROMORPHONE HCL 1 MG/ML IJ SOLN
INTRAMUSCULAR | Status: AC
  Filled 2017-05-30: qty 1

## 2017-05-30 MED ORDER — LIDOCAINE 2% (20 MG/ML) 5 ML SYRINGE
INTRAMUSCULAR | Status: AC
  Filled 2017-05-30: qty 5

## 2017-05-30 MED ORDER — DEXAMETHASONE SODIUM PHOSPHATE 10 MG/ML IJ SOLN
INTRAMUSCULAR | Status: AC
  Filled 2017-05-30: qty 1

## 2017-05-30 MED ORDER — BUPIVACAINE-EPINEPHRINE (PF) 0.5% -1:200000 IJ SOLN
INTRAMUSCULAR | Status: AC
  Filled 2017-05-30: qty 30

## 2017-05-30 MED ORDER — LACTATED RINGERS IV SOLN
INTRAVENOUS | Status: DC | PRN
  Administered 2017-05-30 (×3): via INTRAVENOUS

## 2017-05-30 MED ORDER — DIPHENHYDRAMINE HCL 50 MG/ML IJ SOLN: 12.5000 mg | Freq: Four times a day (QID) | INTRAMUSCULAR | Status: DC | PRN

## 2017-05-30 MED ORDER — KETOROLAC TROMETHAMINE 30 MG/ML IJ SOLN: 30.0000 mg | Freq: Once | INTRAMUSCULAR | Status: DC | PRN

## 2017-05-30 MED ORDER — DIPHENHYDRAMINE HCL 12.5 MG/5ML PO ELIX: 12.5000 mg | ORAL_SOLUTION | Freq: Four times a day (QID) | ORAL | Status: DC | PRN

## 2017-05-30 MED ORDER — ROCURONIUM BROMIDE 10 MG/ML (PF) SYRINGE
PREFILLED_SYRINGE | INTRAVENOUS | Status: AC
  Filled 2017-05-30: qty 5

## 2017-05-30 MED ORDER — MORPHINE SULFATE (PF) 4 MG/ML IV SOLN
2.0000 mg | INTRAVENOUS | Status: DC | PRN
  Administered 2017-05-30 (×2): 4 mg via INTRAVENOUS
  Administered 2017-05-31 (×2): 2 mg via INTRAVENOUS
  Filled 2017-05-30 (×4): qty 1

## 2017-05-30 MED ORDER — FENTANYL CITRATE (PF) 100 MCG/2ML IJ SOLN
INTRAMUSCULAR | Status: AC
  Filled 2017-05-30: qty 4

## 2017-05-30 MED ORDER — HYDROMORPHONE HCL 2 MG/ML IJ SOLN
INTRAMUSCULAR | Status: AC
  Filled 2017-05-30: qty 1

## 2017-05-30 MED ORDER — DOCUSATE SODIUM 100 MG PO CAPS
100.0000 mg | ORAL_CAPSULE | Freq: Two times a day (BID) | ORAL | Status: DC
  Administered 2017-05-30 – 2017-05-31 (×2): 100 mg via ORAL
  Filled 2017-05-30 (×2): qty 1

## 2017-05-30 MED ORDER — PHENYLEPHRINE HCL 10 MG/ML IJ SOLN
INTRAMUSCULAR | Status: DC | PRN
  Administered 2017-05-30: 80 ug via INTRAVENOUS

## 2017-05-30 MED ORDER — ROCURONIUM BROMIDE 100 MG/10ML IV SOLN
INTRAVENOUS | Status: DC | PRN
  Administered 2017-05-30: 20 mg via INTRAVENOUS
  Administered 2017-05-30: 70 mg via INTRAVENOUS
  Administered 2017-05-30: 20 mg via INTRAVENOUS

## 2017-05-30 MED ORDER — ACETAMINOPHEN 160 MG/5ML PO SOLN: 325.0000 mg | ORAL | Status: DC | PRN

## 2017-05-30 MED ORDER — MIDAZOLAM HCL 5 MG/5ML IJ SOLN
INTRAMUSCULAR | Status: DC | PRN
  Administered 2017-05-30 (×2): 2 mg via INTRAVENOUS

## 2017-05-30 MED ORDER — HYDRALAZINE HCL 20 MG/ML IJ SOLN
INTRAMUSCULAR | Status: AC
  Filled 2017-05-30: qty 1

## 2017-05-30 MED ORDER — BUPIVACAINE-EPINEPHRINE 0.5% -1:200000 IJ SOLN
INTRAMUSCULAR | Status: DC | PRN
  Administered 2017-05-30: 30 mL

## 2017-05-30 MED ORDER — KETOROLAC TROMETHAMINE 30 MG/ML IJ SOLN
INTRAMUSCULAR | Status: AC
  Filled 2017-05-30: qty 1

## 2017-05-30 MED ORDER — LIDOCAINE HCL (CARDIAC) 20 MG/ML IV SOLN
INTRAVENOUS | Status: DC | PRN
  Administered 2017-05-30: 25 mg via INTRATRACHEAL
  Administered 2017-05-30: 75 mg via INTRAVENOUS

## 2017-05-30 MED ORDER — OXYCODONE HCL 5 MG PO TABS: 5.0000 mg | ORAL_TABLET | Freq: Once | ORAL | Status: DC | PRN

## 2017-05-30 MED ORDER — LABETALOL HCL 5 MG/ML IV SOLN
INTRAVENOUS | Status: AC
  Filled 2017-05-30: qty 4

## 2017-05-30 MED ORDER — FENTANYL CITRATE (PF) 250 MCG/5ML IJ SOLN
INTRAMUSCULAR | Status: AC
Start: 2017-05-30 — End: ?
  Filled 2017-05-30: qty 5

## 2017-05-30 MED ORDER — BELLADONNA ALKALOIDS-OPIUM 16.2-60 MG RE SUPP
1.0000 | Freq: Four times a day (QID) | RECTAL | Status: DC | PRN
  Administered 2017-05-30 (×2): 1 via RECTAL
  Filled 2017-05-30 (×2): qty 1

## 2017-05-30 MED ORDER — SUGAMMADEX SODIUM 200 MG/2ML IV SOLN
INTRAVENOUS | Status: AC
  Filled 2017-05-30: qty 2

## 2017-05-30 MED ORDER — STERILE WATER FOR IRRIGATION IR SOLN
Status: DC | PRN
  Administered 2017-05-30: 1000 mL

## 2017-05-30 MED ORDER — ONDANSETRON HCL 4 MG/2ML IJ SOLN
4.0000 mg | INTRAMUSCULAR | Status: DC | PRN
  Administered 2017-05-30 – 2017-05-31 (×2): 4 mg via INTRAVENOUS
  Filled 2017-05-30 (×2): qty 2

## 2017-05-30 MED ORDER — EPHEDRINE 5 MG/ML INJ
INTRAVENOUS | Status: AC
  Filled 2017-05-30: qty 10

## 2017-05-30 MED ORDER — KCL IN DEXTROSE-NACL 20-5-0.45 MEQ/L-%-% IV SOLN
INTRAVENOUS | Status: DC
  Administered 2017-05-30 – 2017-05-31 (×3): via INTRAVENOUS
  Filled 2017-05-30 (×3): qty 1000

## 2017-05-30 MED ORDER — ACETAMINOPHEN 325 MG PO TABS: 650.0000 mg | ORAL_TABLET | ORAL | Status: DC | PRN

## 2017-05-30 MED ORDER — HYDRALAZINE HCL 20 MG/ML IJ SOLN
INTRAMUSCULAR | Status: DC | PRN
  Administered 2017-05-30: 5 mg via INTRAVENOUS

## 2017-05-30 MED ORDER — ONDANSETRON HCL 4 MG/2ML IJ SOLN: 4.0000 mg | Freq: Once | INTRAMUSCULAR | Status: DC | PRN

## 2017-05-30 MED ORDER — PHENYLEPHRINE HCL 10 MG/ML IJ SOLN
INTRAMUSCULAR | Status: AC
  Filled 2017-05-30: qty 1

## 2017-05-30 MED ORDER — SULFAMETHOXAZOLE-TRIMETHOPRIM 800-160 MG PO TABS: 1.0000 | ORAL_TABLET | Freq: Two times a day (BID) | ORAL | 0 refills | Status: DC

## 2017-05-30 MED ORDER — LACTATED RINGERS IV SOLN
INTRAVENOUS | Status: DC | PRN
  Administered 2017-05-30: 08:00:00

## 2017-05-30 MED ORDER — DEXAMETHASONE SODIUM PHOSPHATE 10 MG/ML IJ SOLN
INTRAMUSCULAR | Status: DC | PRN
  Administered 2017-05-30: 8 mg via INTRAVENOUS

## 2017-05-30 MED ORDER — HYDROCODONE-ACETAMINOPHEN 5-325 MG PO TABS: 1.0000 | ORAL_TABLET | Freq: Four times a day (QID) | ORAL | 0 refills | Status: DC | PRN

## 2017-05-30 MED ORDER — SODIUM CHLORIDE 0.9 % IR SOLN
Status: DC | PRN
  Administered 2017-05-30: 1000 mL

## 2017-05-30 MED ORDER — HEPARIN SODIUM (PORCINE) 1000 UNIT/ML IJ SOLN
INTRAMUSCULAR | Status: AC
  Filled 2017-05-30: qty 1

## 2017-05-30 MED ORDER — LABETALOL HCL 5 MG/ML IV SOLN
INTRAVENOUS | Status: DC | PRN
  Administered 2017-05-30 (×4): 5 mg via INTRAVENOUS

## 2017-05-30 MED ORDER — BUPIVACAINE HCL (PF) 0.5 % IJ SOLN
INTRAMUSCULAR | Status: AC
  Filled 2017-05-30: qty 30

## 2017-05-30 MED ORDER — CEFAZOLIN SODIUM-DEXTROSE 2-4 GM/100ML-% IV SOLN
2.0000 g | INTRAVENOUS | Status: AC
  Administered 2017-05-30: 2 g via INTRAVENOUS
  Filled 2017-05-30: qty 100

## 2017-05-30 MED ORDER — SUGAMMADEX SODIUM 200 MG/2ML IV SOLN
INTRAVENOUS | Status: DC | PRN
  Administered 2017-05-30: 200 mg via INTRAVENOUS

## 2017-05-30 MED ORDER — PROPOFOL 10 MG/ML IV BOLUS
INTRAVENOUS | Status: AC
  Filled 2017-05-30: qty 40

## 2017-05-30 SURGICAL SUPPLY — 55 items
ADH SKN CLS APL DERMABOND .7 (GAUZE/BANDAGES/DRESSINGS) ×1
APPLICATOR COTTON TIP 6IN STRL (MISCELLANEOUS) ×3 IMPLANT
BAG SPEC RTRVL LRG 6X4 10 (ENDOMECHANICALS)
CATH FOLEY 2WAY SLVR 18FR 30CC (CATHETERS) ×3 IMPLANT
CATH ROBINSON RED A/P 16FR (CATHETERS) ×3 IMPLANT
CATH ROBINSON RED A/P 8FR (CATHETERS) ×3 IMPLANT
CATH TIEMANN FOLEY 18FR 5CC (CATHETERS) ×3 IMPLANT
CHLORAPREP W/TINT 26ML (MISCELLANEOUS) ×3 IMPLANT
CLIP VESOLOCK LG 6/CT PURPLE (CLIP) ×12 IMPLANT
COVER SURGICAL LIGHT HANDLE (MISCELLANEOUS) ×3 IMPLANT
COVER TIP SHEARS 8 DVNC (MISCELLANEOUS) ×4 IMPLANT
COVER TIP SHEARS 8MM DA VINCI (MISCELLANEOUS) ×2
CUTTER ECHEON FLEX ENDO 45 340 (ENDOMECHANICALS) ×3 IMPLANT
DECANTER SPIKE VIAL GLASS SM (MISCELLANEOUS) IMPLANT
DERMABOND ADVANCED (GAUZE/BANDAGES/DRESSINGS) ×1
DERMABOND ADVANCED .7 DNX12 (GAUZE/BANDAGES/DRESSINGS) ×2 IMPLANT
DRAPE ARM DVNC X/XI (DISPOSABLE) ×8 IMPLANT
DRAPE COLUMN DVNC XI (DISPOSABLE) ×2 IMPLANT
DRAPE DA VINCI XI ARM (DISPOSABLE) ×4
DRAPE DA VINCI XI COLUMN (DISPOSABLE) ×1
DRAPE SURG IRRIG POUCH 19X23 (DRAPES) ×3 IMPLANT
DRSG TEGADERM 4X4.75 (GAUZE/BANDAGES/DRESSINGS) ×3 IMPLANT
ELECT PENCIL ROCKER SW 15FT (MISCELLANEOUS) IMPLANT
ELECT REM PT RETURN 15FT ADLT (MISCELLANEOUS) ×3 IMPLANT
GLOVE BIO SURGEON STRL SZ 6.5 (GLOVE) ×3 IMPLANT
GLOVE BIOGEL M STRL SZ7.5 (GLOVE) ×6 IMPLANT
GOWN STRL REUS W/TWL LRG LVL3 (GOWN DISPOSABLE) ×9 IMPLANT
HOLDER FOLEY CATH W/STRAP (MISCELLANEOUS) ×3 IMPLANT
IRRIG SUCT STRYKERFLOW 2 WTIP (MISCELLANEOUS) ×3
IRRIGATION SUCT STRKRFLW 2 WTP (MISCELLANEOUS) ×2 IMPLANT
NDL SAFETY ECLIPSE 18X1.5 (NEEDLE) ×2 IMPLANT
NEEDLE HYPO 18GX1.5 SHARP (NEEDLE) ×3
PACK ROBOT UROLOGY CUSTOM (CUSTOM PROCEDURE TRAY) ×3 IMPLANT
POUCH SPECIMEN RETRIEVAL 10MM (ENDOMECHANICALS) IMPLANT
SEAL CANN UNIV 5-8 DVNC XI (MISCELLANEOUS) ×8 IMPLANT
SEAL XI 5MM-8MM UNIVERSAL (MISCELLANEOUS) ×4
SET IRRIG Y TYPE TUR BLADDER L (SET/KITS/TRAYS/PACK) IMPLANT
SOLUTION ELECTROLUBE (MISCELLANEOUS) IMPLANT
STAPLE RELOAD 45 GRN (STAPLE) ×2 IMPLANT
STAPLE RELOAD 45MM GREEN (STAPLE) ×3
SUT ETHILON 3 0 PS 1 (SUTURE) ×3 IMPLANT
SUT MNCRL 3 0 RB1 (SUTURE) ×2 IMPLANT
SUT MNCRL 3 0 VIOLET RB1 (SUTURE) ×2 IMPLANT
SUT MNCRL AB 4-0 PS2 18 (SUTURE) ×6 IMPLANT
SUT MONOCRYL 3 0 RB1 (SUTURE) ×2
SUT VIC AB 0 CT1 27 (SUTURE) ×3
SUT VIC AB 0 CT1 27XBRD ANTBC (SUTURE) ×2 IMPLANT
SUT VIC AB 0 UR5 27 (SUTURE) ×3 IMPLANT
SUT VIC AB 2-0 SH 27 (SUTURE) ×2
SUT VIC AB 2-0 SH 27X BRD (SUTURE) ×2 IMPLANT
SUT VICRYL 0 UR6 27IN ABS (SUTURE) ×9 IMPLANT
SYR 27GX1/2 1ML LL SAFETY (SYRINGE) ×3 IMPLANT
TOWEL OR 17X26 10 PK STRL BLUE (TOWEL DISPOSABLE) IMPLANT
TOWEL OR NON WOVEN STRL DISP B (DISPOSABLE) ×3 IMPLANT
TUBING INSUFFLATION 10FT LAP (TUBING) IMPLANT

## 2017-05-30 NOTE — Anesthesia Procedure Notes (Signed)
Procedure Name: Intubation Date/Time: 05/30/2017 7:29 AM Performed by: Lissa Morales, CRNA Pre-anesthesia Checklist: Patient identified, Emergency Drugs available, Suction available and Patient being monitored Patient Re-evaluated:Patient Re-evaluated prior to induction Oxygen Delivery Method: Circle system utilized Preoxygenation: Pre-oxygenation with 100% oxygen Induction Type: IV induction Ventilation: Mask ventilation without difficulty Laryngoscope Size: Mac and 4 Grade View: Grade II Tube type: Oral Tube size: 8.0 mm Number of attempts: 1 Airway Equipment and Method: Stylet and Oral airway Placement Confirmation: ETT inserted through vocal cords under direct vision,  positive ETCO2 and breath sounds checked- equal and bilateral Secured at: 22 cm Tube secured with: Tape Dental Injury: Teeth and Oropharynx as per pre-operative assessment

## 2017-05-30 NOTE — Progress Notes (Signed)
Patient ID: [name], male   DOB: [birth year], 62 y.o.   MRN: [id number] Post-op note  Subjective: The patient is doing well.  No complaints except bladder spasms.  Denies N/V  Objective: Vital signs in last 24 hours: Temp:  [97.7 F (36.5 C)-98.9 F (37.2 C)] 97.7 F (36.5 C) (03/11 1257) Pulse Rate:  [74-89] 81 (03/11 1230) Resp:  [9-16] 14 (03/11 1257) BP: (126-168)/(74-102) 128/76 (03/11 1257) SpO2:  [94 %-99 %] 99 % (03/11 1257) Weight:  [83 kg (183 lb)] 83 kg (183 lb) (03/11 0546)  Intake/Output from previous day: No intake/output data recorded. Intake/Output this shift: Total I/O In: 2000 [I.V.:2000] Out: 650 [Urine:500; Drains:50; Blood:100]  Physical Exam:  General: Alert and oriented. Abdomen: Soft, Nondistended. Incisions: Clean and dry. Urine: dark yellow  Lab Results: Recent Labs    05/30/17 1139  HGB 15.0  HCT 41.0    Assessment/Plan: POD#0   1) Continue to monitor  2) DVT prophy, clears, IS, amb, pain control  3) B/O supp for spasms   LOS: 0 days   Debbrah Alar 05/30/2017, 2:53 PM

## 2017-05-30 NOTE — Progress Notes (Signed)
Patient ID: [name], male   DOB: 08-03-1955, 62 y.o.   MRN: [id number]  Post-op note  Subjective: The patient is doing well.  No complaints.  Objective: Vital signs in last 24 hours: Temp:  [97.7 F (36.5 C)-98.9 F (37.2 C)] 97.7 F (36.5 C) (03/11 1257) Pulse Rate:  [74-89] 81 (03/11 1230) Resp:  [9-16] 14 (03/11 1257) BP: (126-168)/(74-102) 128/76 (03/11 1257) SpO2:  [94 %-99 %] 99 % (03/11 1257) Weight:  [83 kg (183 lb)] 83 kg (183 lb) (03/11 0546)  Intake/Output from previous day: No intake/output data recorded. Intake/Output this shift: Total I/O In: 2255 [I.V.:2205; IV Piggyback:50] Out: 695 [Urine:500; Drains:95; Blood:100]  Physical Exam:  General: Alert and oriented. Abdomen: Soft, Nondistended. Incisions: Clean and dry. GU: Urine clear.  Lab Results: Recent Labs    05/30/17 1139  HGB 15.0  HCT 41.0    Assessment/Plan: POD#0   1) Continue to monitor, ambulate, IS   Pryor Curia. MD   LOS: 0 days   Geanna Divirgilio,LES 05/30/2017, 5:50 PM

## 2017-05-30 NOTE — Discharge Instructions (Signed)

## 2017-05-30 NOTE — Transfer of Care (Signed)
Immediate Anesthesia Transfer of Care Note  Patient: [name]  Procedure(s) Performed: XI ROBOTIC ASSISTED LAPAROSCOPIC RADICAL PROSTATECTOMY LEVEL 3 (N/A ) LYMPHADENECTOMY, PELVIC EXTENDED (Bilateral )  Patient Location: PACU  Anesthesia Type:General  Level of Consciousness: awake, alert , oriented and patient cooperative  Airway & Oxygen Therapy: Patient Spontanous Breathing and Patient connected to face mask oxygen  Post-op Assessment: Report given to RN, Post -op Vital signs reviewed and stable and Patient moving all extremities X 4  Post vital signs: stable  Last Vitals:  Vitals:   05/30/17 0531  BP: (!) 140/93  Pulse: 84  Resp: 16  Temp: 36.8 C  SpO2: 98%    Last Pain:  Vitals:   05/30/17 0531  TempSrc: Oral      Patients Stated Pain Goal: 4 (76/22/63 3354)  Complications: No apparent anesthesia complications

## 2017-05-30 NOTE — Anesthesia Postprocedure Evaluation (Signed)
Anesthesia Post Note  Patient: SAFIR MICHALEC  Procedure(s) Performed: XI ROBOTIC ASSISTED LAPAROSCOPIC RADICAL PROSTATECTOMY LEVEL 3 (N/A ) LYMPHADENECTOMY, PELVIC EXTENDED (Bilateral )     Patient location during evaluation: PACU Anesthesia Type: General Level of consciousness: awake and alert Pain management: pain level controlled Vital Signs Assessment: post-procedure vital signs reviewed and stable Respiratory status: spontaneous breathing, nonlabored ventilation, respiratory function stable and patient connected to nasal cannula oxygen Cardiovascular status: blood pressure returned to baseline and stable Postop Assessment: no apparent nausea or vomiting Anesthetic complications: no    Last Vitals:  Vitals:   05/30/17 1118 05/30/17 1130  BP:  (!) 149/94  Pulse: 88 78  Resp: 12 10  Temp:    SpO2: 98% 98%    Last Pain:  Vitals:   05/30/17 0531  TempSrc: Oral                 Keyshia Orwick

## 2017-05-30 NOTE — Op Note (Signed)
Preoperative diagnosis: Clinically localized adenocarcinoma of the prostate (clinical stage T3b N0 M0)  Postoperative diagnosis: Clinically localized adenocarcinoma of the prostate (clinical stage T3b N0 M0)  Procedure:  1. Robotic assisted laparoscopic radical prostatectomy (Non nerve sparing) 2. Bilateral robotic assisted laparoscopic extended pelvic lymphadenectomy  Surgeon: Pryor Curia. M.D.  Assistant(s): Debbrah Alar, PA-C  An assistant was required for this surgical procedure.  The duties of the assistant included but were not limited to suctioning, passing suture, camera manipulation, retraction. This procedure would not be able to be performed without an Environmental consultant.  Anesthesia: General  Complications: None  EBL: 100 mL  IVF:  2000 mL crystalloid  Specimens: 1. Prostate and seminal vesicles 2. Right pelvic lymph nodes 3. Left pelvic lymph nodes  Disposition of specimens: Pathology  Drains: 1. 20 Fr coude catheter 2. # 19 Blake pelvic drain  Indication: [name] is a 62 y.o. patient with locally advanced prostate cancer.  After a thorough review of the management options for treatment of prostate cancer, he elected to proceed with primary surgical therapy and the above procedure(s).  We have discussed the potential benefits and risks of the procedure, side effects of the proposed treatment, the likelihood of the patient achieving the goals of the procedure, and any potential problems that might occur during the procedure or recuperation. Informed consent has been obtained.  Description of procedure:  The patient was taken to the operating room and a general anesthetic was administered. He was given preoperative antibiotics, placed in the dorsal lithotomy position, and prepped and draped in the usual sterile fashion. Next a preoperative timeout was performed. A urethral catheter was placed into the bladder and a site was selected near the umbilicus for  placement of the camera port. This was placed using a standard open Hassan technique which allowed entry into the peritoneal cavity under direct vision and without difficulty. An 8 mm port was placed and a pneumoperitoneum established. The camera was then used to inspect the abdomen and there was no evidence of any intra-abdominal injuries or other abnormalities. The remaining abdominal ports were then placed. 8 mm robotic ports were placed in the right lower quadrant, left lower quadrant, and far left lateral abdominal wall. A 5 mm port was placed in the right upper quadrant and a 12 mm port was placed in the right lateral abdominal wall for laparoscopic assistance. All ports were placed under direct vision without difficulty. The surgical cart was then docked.   Utilizing the cautery scissors, the bladder was reflected posteriorly allowing entry into the space of Retzius and identification of the endopelvic fascia and prostate. The periprostatic fat was then removed from the prostate allowing full exposure of the endopelvic fascia. The endopelvic fascia was then incised from the apex back to the base of the prostate bilaterally and the underlying levator muscle fibers were swept laterally off the prostate thereby isolating the dorsal venous complex. The dorsal vein was then stapled and divided with a 45 mm Flex Echelon stapler. Attention then turned to the bladder neck which was divided anteriorly thereby allowing entry into the bladder and exposure of the urethral catheter. The catheter balloon was deflated and the catheter was brought into the operative field and used to retract the prostate anteriorly. The posterior bladder neck was then examined and was divided allowing further dissection between the bladder and prostate posteriorly until the vasa deferentia and seminal vessels were identified. The vasa deferentia were isolated, divided, and lifted anteriorly. The  seminal vesicles were dissected down to  their tips with care to control the seminal vascular arterial blood supply. These structures were then lifted anteriorly and the space between Denonvillier's fascia and the anterior rectum was developed with a combination of sharp and blunt dissection. This isolated the vascular pedicles of the prostate.  A wide non nerve sparing dissection was performed with Weck clips used to ligate the vascular pedicles of the prostate bilaterally. The vascular pedicles of the prostate were then divided.  The urethra was then sharply transected allowing the prostate specimen to be disarticulated. The pelvis was copiously irrigated and hemostasis was ensured. There was no evidence for rectal injury.  Attention then turned to the right pelvic sidewall. The fibrofatty tissue extending from the genitofemoral nerve laterally to the confluence of the iliac vessels proximally to the hypogastric artery posteriorly and Cooper's ligament distally was dissected free from the pelvic sidewall with care to preserve the obturator nerve and major vascular structures. Weck clips were used for lymphostasis and hemostasis. An identical procedure was then performed on the contralateral side and the lymphatic packets were removed for permanent pathologic analysis.  Attention then turned to the urethral anastomosis. A 2-0 Vicryl slip knot was placed between Denonvillier's fascia, the posterior bladder neck, and the posterior urethra to reapproximate these structures. A double-armed 3-0 Monocryl suture was then used to perform a 360 running tension-free anastomosis between the bladder neck and urethra. A new urethral catheter was then placed into the bladder and irrigated. There were no blood clots within the bladder and the anastomosis appeared to be watertight. A #19 Blake drain was then brought through the left lateral 8 mm port site and positioned appropriately within the pelvis. It was secured to the skin with a nylon suture. The surgical  cart was then undocked. The right lateral 12 mm port site was closed at the fascial level with a 0 Vicryl suture placed laparoscopically. All remaining ports were then removed under direct vision. The prostate specimen was removed intact within the Endopouch retrieval bag via the periumbilical camera port site. This fascial opening was closed with two running 0 Vicryl sutures. 0.25% Marcaine was then injected into all port sites and all incisions were reapproximated at the skin level with 4-0 Monocryl subcuticular sutures. Dermabond was applied. The patient appeared to tolerate the procedure well and without complications. The patient was able to be extubated and transferred to the recovery unit in satisfactory condition.  Pryor Curia MD

## 2017-05-31 DIAGNOSIS — C61 Malignant neoplasm of prostate: Secondary | ICD-10-CM | POA: Diagnosis not present

## 2017-05-31 LAB — HEMOGLOBIN AND HEMATOCRIT, BLOOD
HCT: 37.2 % — ABNORMAL LOW (ref 39.0–52.0)
HEMOGLOBIN: 13.2 g/dL (ref 13.0–17.0)

## 2017-05-31 MED ORDER — BISACODYL 10 MG RE SUPP
10.0000 mg | Freq: Once | RECTAL | Status: AC
  Administered 2017-05-31: 10 mg via RECTAL
  Filled 2017-05-31: qty 1

## 2017-05-31 MED ORDER — HYDROCODONE-ACETAMINOPHEN 5-325 MG PO TABS
1.0000 | ORAL_TABLET | Freq: Four times a day (QID) | ORAL | Status: DC | PRN
  Administered 2017-05-31: 2 via ORAL
  Filled 2017-05-31: qty 2

## 2017-05-31 NOTE — Care Management Note (Signed)
Case Management Note  Patient Details  Name: [name] MRN: [id number] Date of Birth: 1955-10-31  Subjective/Objective:                    Action/Plan:d/c home.   Expected Discharge Date:  05/31/17               Expected Discharge Plan:  Home/Self Care  In-House Referral:     Discharge planning Services  CM Consult  Post Acute Care Choice:    Choice offered to:     DME Arranged:    DME Agency:     HH Arranged:    HH Agency:     Status of Service:  Completed, signed off  If discussed at [employer] of Stay Meetings, dates discussed:    Additional Comments:  Dessa Phi, RN 05/31/2017, 12:17 PM

## 2017-05-31 NOTE — Discharge Summary (Signed)
  Date of admission: 05/30/2017  Date of discharge: 05/31/2017  Admission diagnosis: Prostate Cancer  Discharge diagnosis: Prostate Cancer  History and Physical: For full details, please see admission history and physical. Briefly, [name] is a 62 y.o. gentleman with localized prostate cancer.  After discussing management/treatment options, he elected to proceed with surgical treatment.  Hospital Course: [name] was taken to the operating room on 05/30/2017 and underwent a robotic assisted laparoscopic radical prostatectomy. He tolerated this procedure well and without complications. Postoperatively, he was able to be transferred to a regular hospital room following recovery from anesthesia.  He was able to begin ambulating the night of surgery. He remained hemodynamically stable overnight.  He had excellent urine output with appropriately minimal output from his pelvic drain and his pelvic drain was removed on POD #1.  He was transitioned to oral pain medication, tolerated a clear liquid diet, and had met all discharge criteria and was able to be discharged home later on POD#1.  Laboratory values:  Recent Labs    05/30/17 1139 05/31/17 0500  HGB 15.0 13.2  HCT 41.0 37.2*    Disposition: Home  Discharge instruction: He was instructed to be ambulatory but to refrain from heavy lifting, strenuous activity, or driving. He was instructed on urethral catheter care.  Discharge medications:   Allergies as of 05/31/2017   No Known Allergies     Medication List    STOP taking these medications   ALEVE 220 MG Caps Generic drug:  Naproxen Sodium   traMADol 50 MG tablet Commonly known as:  ULTRAM     TAKE these medications   HYDROcodone-acetaminophen 5-325 MG tablet Commonly known as:  NORCO Take 1-2 tablets by mouth every 6 (six) hours as needed for moderate pain or severe pain.   sulfamethoxazole-trimethoprim 800-160 MG tablet Commonly known as:  BACTRIM DS,SEPTRA  DS Take 1 tablet by mouth 2 (two) times daily. Start the day prior to foley removal appointment       Followup: He will followup in 1 week for catheter removal and to discuss his surgical pathology results.

## 2017-05-31 NOTE — Progress Notes (Signed)
Patient ID: [name], male   DOB: 1955/05/07, 62 y.o.   MRN: [id number]  1 Day Post-Op Subjective: The patient is doing well.  Moderate nausea overnight. Pain is adequately controlled.  Objective: Vital signs in last 24 hours: Temp:  [97.7 F (36.5 C)-98.9 F (37.2 C)] 97.8 F (36.6 C) (03/12 0405) Pulse Rate:  [63-89] 63 (03/12 0405) Resp:  [9-16] 16 (03/11 2220) BP: (110-168)/(64-102) 126/71 (03/12 0405) SpO2:  [94 %-99 %] 98 % (03/12 0405)  Intake/Output from previous day: 03/11 0701 - 03/12 0700 In: 4352.5 [I.V.:4302.5; IV Piggyback:50] Out: 9150 [Urine:3900; Drains:275; Blood:100] Intake/Output this shift: No intake/output data recorded.  Physical Exam:  General: Alert and oriented. CV: RRR Lungs: Clear bilaterally. GI: Soft, Nondistended. Incisions: Clean, dry, and intact Urine: Clear Extremities: Nontender, no erythema, no edema.  Lab Results: Recent Labs    05/30/17 1139 05/31/17 0500  HGB 15.0 13.2  HCT 41.0 37.2*      Assessment/Plan: POD# 1 s/p robotic prostatectomy.  1) SL IVF 2) Ambulate, Incentive spirometry 3) Transition to oral pain medication 4) Dulcolax suppository 5) D/C pelvic drain 6) Plan for likely discharge later today if nausea improved   Roxy Horseman, Brooke Bonito. MD   LOS: 0 days   Corissa Oguinn,LES 05/31/2017, 7:51 AM

## 2017-05-31 NOTE — Progress Notes (Signed)
Patient ambulated on hall way twice, tolerated well.  

## 2017-06-02 ENCOUNTER — Encounter (HOSPITAL_COMMUNITY): Payer: Self-pay | Admitting: Urology

## 2017-06-06 ENCOUNTER — Telehealth: Payer: Self-pay | Admitting: Medical Oncology

## 2017-06-06 NOTE — Telephone Encounter (Signed)
Left message to let patient know that I am following up post prostatectomy 05/30/17. I asked him to call me with an update.

## 2017-06-28 DIAGNOSIS — M62838 Other muscle spasm: Secondary | ICD-10-CM | POA: Diagnosis not present

## 2017-06-28 DIAGNOSIS — N393 Stress incontinence (female) (male): Secondary | ICD-10-CM | POA: Diagnosis not present

## 2017-06-28 DIAGNOSIS — M6281 Muscle weakness (generalized): Secondary | ICD-10-CM | POA: Diagnosis not present

## 2017-07-14 DIAGNOSIS — M6281 Muscle weakness (generalized): Secondary | ICD-10-CM | POA: Diagnosis not present

## 2017-07-14 DIAGNOSIS — N393 Stress incontinence (female) (male): Secondary | ICD-10-CM | POA: Diagnosis not present

## 2017-07-14 DIAGNOSIS — K5900 Constipation, unspecified: Secondary | ICD-10-CM | POA: Diagnosis not present

## 2017-07-14 DIAGNOSIS — M62838 Other muscle spasm: Secondary | ICD-10-CM | POA: Diagnosis not present

## 2017-08-02 DIAGNOSIS — M6281 Muscle weakness (generalized): Secondary | ICD-10-CM | POA: Diagnosis not present

## 2017-08-02 DIAGNOSIS — C61 Malignant neoplasm of prostate: Secondary | ICD-10-CM | POA: Diagnosis not present

## 2017-08-02 DIAGNOSIS — N393 Stress incontinence (female) (male): Secondary | ICD-10-CM | POA: Diagnosis not present

## 2017-08-02 DIAGNOSIS — M62838 Other muscle spasm: Secondary | ICD-10-CM | POA: Diagnosis not present

## 2017-08-02 DIAGNOSIS — K5900 Constipation, unspecified: Secondary | ICD-10-CM | POA: Diagnosis not present

## 2017-08-30 DIAGNOSIS — M62838 Other muscle spasm: Secondary | ICD-10-CM | POA: Diagnosis not present

## 2017-08-30 DIAGNOSIS — C61 Malignant neoplasm of prostate: Secondary | ICD-10-CM | POA: Diagnosis not present

## 2017-08-30 DIAGNOSIS — N393 Stress incontinence (female) (male): Secondary | ICD-10-CM | POA: Diagnosis not present

## 2017-08-30 DIAGNOSIS — M6281 Muscle weakness (generalized): Secondary | ICD-10-CM | POA: Diagnosis not present

## 2017-09-07 DIAGNOSIS — C775 Secondary and unspecified malignant neoplasm of intrapelvic lymph nodes: Secondary | ICD-10-CM | POA: Diagnosis not present

## 2017-09-07 DIAGNOSIS — C61 Malignant neoplasm of prostate: Secondary | ICD-10-CM | POA: Diagnosis not present

## 2017-10-03 DIAGNOSIS — M6281 Muscle weakness (generalized): Secondary | ICD-10-CM | POA: Diagnosis not present

## 2017-10-03 DIAGNOSIS — C61 Malignant neoplasm of prostate: Secondary | ICD-10-CM | POA: Diagnosis not present

## 2017-10-03 DIAGNOSIS — K5900 Constipation, unspecified: Secondary | ICD-10-CM | POA: Diagnosis not present

## 2017-10-03 DIAGNOSIS — N393 Stress incontinence (female) (male): Secondary | ICD-10-CM | POA: Diagnosis not present

## 2017-10-03 DIAGNOSIS — M62838 Other muscle spasm: Secondary | ICD-10-CM | POA: Diagnosis not present

## 2017-10-06 ENCOUNTER — Telehealth: Payer: Self-pay | Admitting: Medical Oncology

## 2017-10-06 NOTE — Telephone Encounter (Signed)
Left message with appointment for Prostate [name] 10/11/17 arriving at 12:30 pm. I reviewed valet parking and registration process. He was seen in the [facility]  04/29/17 and we will update records upon his arrival. I asked him to return my call to confirm.

## 2017-10-06 NOTE — Telephone Encounter (Signed)
Called to confirm appointment for Prostate [facility] 10/11/17.

## 2017-10-10 ENCOUNTER — Encounter: Payer: Self-pay | Admitting: Radiation Oncology

## 2017-10-10 ENCOUNTER — Telehealth: Payer: Self-pay | Admitting: Medical Oncology

## 2017-10-10 NOTE — Progress Notes (Signed)
GU Location of Tumor / Histology: Prostatic adenocarcinoma with positive surgical margins (4/29 lymph nodes positive)  If Prostate Cancer, Gleason Score is (4 + 5) and PSA is (15.6) pre treatment. Patient seen in [facility] on 04/29/2017.  [name] is s/p a NNS RAL radical prostatectomy and BPLND on 05/30/2017.  08/30/2017 PSA  0.27 08/02/2017 PSA  0.21 03/09/2017 PSA  15.60 02/15/2017 PSA  11.722    Past/Anticipated interventions by urology, if any: prostate biopsy, referral to [facility], prostatectomy, referral back to [facility]  Past/Anticipated interventions by medical oncology, if any: no  Weight changes, if any: no  Bowel/Bladder complaints, if any: Improvement in continence reported. Currently using one pad per day.   Nausea/Vomiting, if any: no  Pain issues, if any:  no  SAFETY ISSUES:  Prior radiation? no  Pacemaker/ICD? no  Possible current pregnancy? no  Is the patient on methotrexate? no  Current Complaints / other details:  62 year old male. Married with one adopted daughter. Works as a [employer] manager.

## 2017-10-10 NOTE — Telephone Encounter (Signed)
Left message reminder for [facility] 10/11/17 arriving at 12:30pm. I reviewed [place] parking, registration and reminded him to have lunch due to the length of the clinic.

## 2017-10-11 ENCOUNTER — Telehealth: Payer: Self-pay | Admitting: Oncology

## 2017-10-11 ENCOUNTER — Inpatient Hospital Stay: Payer: BLUE CROSS/BLUE SHIELD | Attending: Oncology | Admitting: Oncology

## 2017-10-11 ENCOUNTER — Ambulatory Visit
Admission: RE | Admit: 2017-10-11 | Discharge: 2017-10-11 | Disposition: A | Discharge status: Home / Self Care | Payer: BLUE CROSS/BLUE SHIELD | Source: Ambulatory Visit | Attending: Radiation Oncology | Admitting: Radiation Oncology

## 2017-10-11 ENCOUNTER — Encounter: Payer: Self-pay | Admitting: General Practice

## 2017-10-11 ENCOUNTER — Encounter: Payer: Self-pay | Admitting: Medical Oncology

## 2017-10-11 VITALS — BP 137/81 | HR 56 | Temp 97.4°F | Resp 18 | Ht 69.0 in | Wt 184.8 lb

## 2017-10-11 DIAGNOSIS — R32 Unspecified urinary incontinence: Secondary | ICD-10-CM

## 2017-10-11 DIAGNOSIS — C779 Secondary and unspecified malignant neoplasm of lymph node, unspecified: Secondary | ICD-10-CM | POA: Insufficient documentation

## 2017-10-11 DIAGNOSIS — R9721 Rising PSA following treatment for malignant neoplasm of prostate: Secondary | ICD-10-CM | POA: Insufficient documentation

## 2017-10-11 DIAGNOSIS — C61 Malignant neoplasm of prostate: Secondary | ICD-10-CM

## 2017-10-11 DIAGNOSIS — C775 Secondary and unspecified malignant neoplasm of intrapelvic lymph nodes: Secondary | ICD-10-CM | POA: Diagnosis not present

## 2017-10-11 DIAGNOSIS — C637 Malignant neoplasm of other specified male genital organs: Secondary | ICD-10-CM | POA: Insufficient documentation

## 2017-10-11 DIAGNOSIS — Z9079 Acquired absence of other genital organ(s): Secondary | ICD-10-CM | POA: Diagnosis not present

## 2017-10-11 NOTE — Consult Note (Signed)
[facility]     10/11/2017   --------------------------------------------------------------------------------   Wallis L. Bhakta  MRN: [id number]  PRIMARY CARE:  Montez Morita, MD  DOB: 1955-09-04, 62 year old Male  REFERRING:  Montez Morita, MD  SSN:-[id number]  PROVIDER:  Irine Seal, M.D.    TREATING:  [name], M.D.    LOCATION:  [facility] [phone]   --------------------------------------------------------------------------------   CC/HPI: Lymph node positive prostate cancer   Mr. Zirbes follows up in the multidisciplinary Clinic today to discuss treatment options for his lymph node positive disease. He was noted to have Gleason 9 prostate cancer with seminal vesicle involvement and positive surgical margins as well as 4/29 lymph nodes that were positive. His initial PSA postoperatively was 0.21 in increased to 0.27 in early June. Follows up today after his most recent PSA last week that was further elevated at 0.47.   He had been making expected progress with his continence and was using 1 pad at his last visit 1 month ago. However, he has plateaued and has not seen significant improvement. He is still having significant leakage particularly with increased activity. He states that he has been compliant with his physical therapy exercises. He otherwise remains in excellent overall health.     ALLERGIES: None   MEDICATIONS: Aleve PRN  Diazepam 10 mg tablet 1-2 tablet PO Daily Take one hour prior to procedure.  Tramadol Hcl 50 mg tablet 1 tablet PO Q 6 H PRN     Notes: unable to remove diazepam and tramadol from chart -ajc,cma   GU PSH: Laparoscopy; Lymphadenectomy - 05/30/2017 Prostate Needle Biopsy - 03/31/2017 Robotic Radical Prostatectomy - 05/30/2017    NON-GU PSH: Incisional hernia repair (open) Knee replacement, Bilateral Shoulder Surgery (Unspecified), Right Surgical Pathology, Gross And Microscopic Examination For Prostate  Needle - 03/31/2017    GU PMH: Stress Incontinence - 10/03/2017, - 08/30/2017, - 08/02/2017, - 07/14/2017, - 06/28/2017, - 05/17/2017 Metastatic pelvic lymphadenopathy - 09/07/2017 Prostate Cancer - 04/29/2017 Elevated PSA - 03/31/2017, He had an elevated PSA with possible prostatitis. His symptoms are a little better post bactrim but his prostate is diffusely firm. I am going to repeat a PSA today and if it is not falling, I will have him return for a prostate [place] and biopsy. I reviewed the risks of the biopsy including bleeding, infection and voiding difficulty. If the PSA is falling, I will give him a month of antibiotics and recheck him. , - 03/09/2017 Disorder of male genital organs, unspecified (Worsening, Chronic), Right, Ketorolac 30 mg IM today. Continue with good scrotal support. SMZ-TMP DS 1 po BID X 10 days. - 03/18/2017 BPH w/LUTS, He has persistent LUTS with an IPSS of 21. I am going to resume the tamsulosin and will have him return for a flowrate and PVR. - 03/09/2017 Right testicular pain, He has right testicular pain that may be referred from the right groin. He is tender in the groin and could have a stain. I have recommended aleve 2 tabs bid for a couple of weeks. - 03/09/2017 Weak Urinary Stream - 03/09/2017      PMH Notes:  1) Prostate cancer: He is s/p a NNS RAL radical prostatectomy and BPLND on 05/30/17.   Diagnosis: pT3b N1 Mx, Gleason 4+5=9 adenocarcinoma with positive surgical margins (4/29 lymph nodes positive)  Pretreatment PSA: 15.6  Pretreatment SHIM score: 25   NON-GU PMH: Constipation, unspecified - 10/03/2017, - 08/30/2017 Muscle weakness (generalized) - 10/03/2017, - 08/30/2017 Other muscle spasm -  10/03/2017, - 08/30/2017 Glycosuria, He has 2+ glucose today and has had borderline hyperglycemia. - 03/09/2017 Arthritis    FAMILY HISTORY: Cancer - Mother Death of family member - Mother, Father    Notes: 1 daughter (adopted)   SOCIAL HISTORY: Marital Status:  Married Preferred Language: English; Ethnicity: Not Hispanic Or Latino; Race: White Current Smoking Status: Patient has never smoked.   Tobacco Use Assessment Completed: Used Tobacco in last 30 days? Does not use smokeless tobacco. Has never drank.  Drinks 3 caffeinated drinks per day. Patient's occupation Engineer, maintenance.    REVIEW OF SYSTEMS:    GU Review Male:   Patient denies frequent urination, hard to postpone urination, burning/ pain with urination, get up at night to urinate, leakage of urine, stream starts and stops, trouble starting your streams, and have to strain to urinate .  Gastrointestinal (Lower):   Patient denies diarrhea and constipation.  Gastrointestinal (Upper):   Patient denies nausea and vomiting.  Constitutional:   Patient denies fever, night sweats, weight loss, and fatigue.  Skin:   Patient denies skin rash/ lesion and itching.  Eyes:   Patient denies blurred vision and double vision.  Ears/ Nose/ Throat:   Patient denies sinus problems and sore throat.  Hematologic/Lymphatic:   Patient denies swollen glands and easy bruising.  Cardiovascular:   Patient denies leg swelling and chest pains.  Respiratory:   Patient denies cough and shortness of breath.  Endocrine:   Patient denies excessive thirst.  Musculoskeletal:   Patient denies back pain and joint pain.  Neurological:   Patient denies headaches and dizziness.  Psychologic:   Patient denies depression and anxiety.   VITAL SIGNS: None   MULTI-SYSTEM PHYSICAL EXAMINATION:    Constitutional: Well-nourished. No physical deformities. Normally developed. Good grooming.     PAST DATA REVIEWED:  Source Of History:  Patient  Lab Test Review:   PSA  Records Review:   Previous Patient Records   10/03/17 08/30/17 08/02/17 03/09/17 02/15/17  PSA  Total PSA 0.47 ng/mL 0.27 ng/mL 0.21 ng/mL 15.60 ng/mL 11.722 ng/dl    PROCEDURES: None   ASSESSMENT:      ICD-10 Details  1 GU:   Prostate Cancer - C61   2    Metastatic pelvic lymphadenopathy - C77.5    PLAN:           Document Letter(s):  Created for Patient: Clinical Summary         Notes:   1. Lymph node positive prostate cancer with a persistently elevated PSA post prostatectomy: Mr. Borak is seen both Dr. Tammi Klippel and Dr. Alen Blew today. We discussed his situation in our multidisciplinary conference today and it was recommended that he consider at least short-term androgen deprivation therapy with salvage radiation therapy to the prostatic fossa and pelvic lymph nodes. He does understand the small but possible chance of radiation therapy my provide cure or at least delay progression. Unfortunately, he has not fully regain continence yet. After an in-depth discussion, he will proceed with androgen deprivation therapy. He will continue to work on his continence and will plan to follow up with me in approximately 2 months for further assessment to see if he is ready to proceed with radiation therapy at that time per Dr. Tammi Klippel.   CC: Dr. Berneta Sages for

## 2017-10-11 NOTE — Telephone Encounter (Signed)
Per 7/23 no los

## 2017-10-11 NOTE — Progress Notes (Signed)
Hematology and Oncology Follow Up Visit  [name] [id number] Dec 01, 1955 62 y.o. 10/11/2017 1:57 PM [name]   Principle Diagnosis: 62 year old man with prostate cancer diagnosed January 2019.  His Gleason score 4+5 = 9 with a PSA of 15.6.  Final pathological staging was T3b N1   Prior Therapy: He is status post radical prostatectomy completed on May 30, 2017.  The final pathology showed Gleason score 4+5 = 9 with tumor invades into the left seminal vesicle extraprostatic extension and 4 out of 29 lymph nodes involved with malignancy.  Current therapy: Under evaluation for additional therapy.  Interim History: [name] presents today for a follow-up visit.  Since last visit, he underwent radical prostatectomy completed by Dr. Alinda Money on May 30, 2017.  He recovered reasonably well although he still has issues with incontinence at times.  He has regained most of his urine control but does report periodic stress incontinence.  He denies any excessive fatigue or tiredness.  He has regained most activities of daily living.  He denies any back pain or abdominal discomfort.  He does not report any headaches, blurry vision, syncope or seizures. Does not report any fevers, chills or sweats.  Does not report any cough, wheezing or hemoptysis.  Does not report any chest pain, palpitation, orthopnea or leg edema.  Does not report any nausea, vomiting or abdominal pain.  Does not report any constipation or diarrhea.  Does not report any skeletal complaints.    Does not report frequency, urgency or hematuria.  Does not report any skin rashes or lesions. Does not report any heat or cold intolerance.  Does not report any lymphadenopathy or petechiae.  Does not report any anxiety or depression.  Remaining review of systems is negative.    Medications: I have reviewed the patient's current medications.  Current Outpatient Medications  Medication Sig Dispense Refill  .  HYDROcodone-acetaminophen (NORCO) 5-325 MG tablet Take 1-2 tablets by mouth every 6 (six) hours as needed for moderate pain or severe pain. 30 tablet 0  . sulfamethoxazole-trimethoprim (BACTRIM DS,SEPTRA DS) 800-160 MG tablet Take 1 tablet by mouth 2 (two) times daily. Start the day prior to foley removal appointment 6 tablet 0   No current facility-administered medications for this visit.      Allergies: No Known Allergies  Past Medical History, Surgical history, Social history, and Family History were reviewed and updated.    Physical Exam: ECOG:  0 General appearance: alert and cooperative appeared without distress. Head: Normocephalic, without obvious abnormality Oropharynx: No oral thrush or ulcers. Eyes: No scleral icterus.  Pupils are equal and round reactive to light. Lymph nodes: Cervical, supraclavicular, and axillary nodes normal. Heart:regular rate and rhythm, S1, S2 normal, no murmur, click, rub or gallop Lung:chest clear, no wheezing, rales, normal symmetric air entry Abdomin: soft, non-tender, without masses or organomegaly. Neurological: No motor, sensory deficits.  Intact deep tendon reflexes. Skin: No rashes or lesions.  No ecchymosis or petechiae. Musculoskeletal: No joint deformity or effusion. Psychiatric: Mood and affect are appropriate.    Lab Results: Lab Results  Component Value Date   WBC 5.5 05/26/2017   HGB 13.2 05/31/2017   HCT 37.2 (L) 05/31/2017   MCV 87.9 05/26/2017   PLT 129 (L) 05/26/2017     Chemistry      Component Value Date/Time   NA 141 05/26/2017 0921   K 4.9 05/26/2017 0921   CL 105 05/26/2017 0921   CO2 28 05/26/2017 0921  BUN 14 05/26/2017 0921   CREATININE 1.07 05/26/2017 0921      Component Value Date/Time   CALCIUM 9.1 05/26/2017 0921       Impression and Plan:  62 year old man with the following:  1.  Prostate cancer diagnosed in January of 2019.  He underwent a prostatectomy which showed a Gleason score 4+5 =  9 and a PSA of 15.6.  The final pathology showed T3b and 1 with 4 out of 29 lymph nodes involved.  His PSA postoperatively declined to 0.42 subsequently to 0.27 and most recently at 0.47 on October 03, 2017.  His case was discussed today in the prostate cancer multidisciplinary clinic including review with the discussing pathologist.  Options of treatment were discussed and additional therapy with radiation and and androgen deprivation is warranted.  Given his extracapsular extension and positive lymph nodes, systemic therapy is reasonable.  The rationale for using androgen deprivation was discussed today.  Complication associated with this therapy include hot flashes, weight gain, sexual dysfunction among others were reviewed and discussed.  The duration of therapy would be at minimum of 6 months longer duration would be a consideration of his PSA starts to rise after that.  He understands he is at risk of developing metastatic disease and periodic monitoring of his PSA will be required.  2.  Incontinence: This remains an issue which could be problematic as he is dissipating to start radiation.  All his questions were answered today to his satisfaction.  15  minutes was spent with the patient face-to-face today.  More than 50% of time was dedicated to patient counseling, education and discussing the natural course of his disease and treatment options.  [name], Beard 7/23/20191:57 PM

## 2017-10-11 NOTE — Progress Notes (Signed)
Radiation Oncology         [phone] ________________________________  Multidisciplinary Prostate Cancer Clinic  Initial Radiation Oncology Consultation  Name: [name] MRN: [id number]  Date: 10/11/2017  DOB: 1955-08-27  DD:[id number], [name], MD  [name], MD   REFERRING PHYSICIAN: [name], MD  DIAGNOSIS: 62 y.o. gentleman with detectable PSA of 0.47 s/p RALP with BPLND on 05/30/17 for stage T1c adenocarcinoma of the prostate with a Gleason's score of 4+5 and a pretreatment PSA of 15.6.    ICD-10-CM   1. Malignant neoplasm of prostate ([place]) C61     HISTORY OF PRESENT ILLNESS::[name] is a 62 y.o. gentleman.  He was noted to have an elevated PSA of 15.6 by his primary care physician, Dr. [name], MD.  Accordingly, he was referred for evaluation in urology by Dr. Alinda Money on 03/31/17,  digital rectal examination was performed at that time revealing firm prostate.  The patient proceeded to transrectal ultrasound with 12 biopsies of the prostate on 03/31/2017.  The prostate volume measured 35 cc.  Out of 12 core biopsies,all were positive.  The maximum Gleason score was 4+5, and this was seen in left mid lateral.  Axumin PET scan on 04/12/17 for disease staging demonstrated intense radiotracer activity within the prostate gland consistent with prostate adenocarcinoma but no evidence of metastatic diease.   He was initially seen in  prostate clinic on 04/29/2017 and elected at that time to proceed with prostatectomy for treatment of his disease.  He underwent a NNS RAL radical prostatectomy and BPLND on 05/30/17.  Final surgical pathology confirmed pT3b, N1, MX, Gleason 4+5 = 9 adenocarcinoma of the prostate with positive surgical margins, extracapsular extension, seminal vesicle involvement and 4 of 29 lymph nodes positive ( 1/15 lymph nodes on the right and 3/15 lymph nodes on the left were positive for metastatic carcinoma).  His initial postop PSA on 08/02/2017  was 0.21.  Subsequent follow-up PSA was 0.27 on 08/30/2017 and more recently 0.47 on 10/03/2017.  The patient reviewed the pathology and PSA results with his urologist and he has kindly been referred today to the multidisciplinary prostate cancer clinic for presentation of pathology and radiology studies in our conference for discussion of potential salvage radiation treatment options and clinical evaluation.   Patient reports that has had gradual improvement with urinary continence. At this point he is mainly using one pad a day, sometimes two pads depending on his level of activity.  He denies bowel incontinence, abdominal pain, N/V or diarrhea.  PSA 10/03/2017    0.47 08/30/2017           0.27 08/02/2017           0.21 03/09/2017         15.60 02/15/2017         11.72   PREVIOUS RADIATION THERAPY: No  PAST MEDICAL HISTORY:  has a past medical history of Arthritis, Internal hemorrhoids (2014), Osteoarthritis of both knees, Pre-diabetes, Prostate cancer ([place]), and Sigmoid diverticulosis (2014).    PAST SURGICAL HISTORY: Past Surgical History:  Procedure Laterality Date  . COLONOSCOPY N/A 09/13/2012   Procedure: COLONOSCOPY;  Surgeon: Danie Binder, MD;  [facility];  Service: Endoscopy;  Laterality: N/A;  9:30 AM  . HERNIA REPAIR     20 years ago  . KNEE ARTHROSCOPY Left    15 years ago  . LYMPHADENECTOMY Bilateral 05/30/2017   Procedure: LYMPHADENECTOMY, PELVIC EXTENDED;  Surgeon: [name], MD;  Location: [facility];  Service: Urology;  Laterality: Bilateral;  . ROBOT ASSISTED LAPAROSCOPIC RADICAL PROSTATECTOMY N/A 05/30/2017   Procedure: XI ROBOTIC ASSISTED LAPAROSCOPIC RADICAL PROSTATECTOMY LEVEL 3;  Surgeon: [name], MD;  Location: [facility];  Service: Urology;  Laterality: N/A;  . SHOULDER ARTHROSCOPY Right    15 years ago    FAMILY HISTORY: family history includes Cancer in his mother.  SOCIAL HISTORY:  reports that he has never smoked. He has never used  smokeless tobacco. He reports that he does not drink alcohol or use drugs.  ALLERGIES: Patient has no known allergies.  MEDICATIONS:  Current Outpatient Medications  Medication Sig Dispense Refill  . HYDROcodone-acetaminophen (NORCO) 5-325 MG tablet Take 1-2 tablets by mouth every 6 (six) hours as needed for moderate pain or severe pain. 30 tablet 0  . sulfamethoxazole-trimethoprim (BACTRIM DS,SEPTRA DS) 800-160 MG tablet Take 1 tablet by mouth 2 (two) times daily. Start the day prior to foley removal appointment 6 tablet 0   No current facility-administered medications for this encounter.     REVIEW OF SYSTEMS:  On review of systems, the patient reports that he is doing well overall. He denies any chest pain, shortness of breath, cough, fevers, chills, night sweats, unintended weight changes. He denies any bowel disturbances, and denies abdominal pain, nausea or vomiting. He denies any new musculoskeletal or joint aches or pains.  He has not quite regained full urinary continence since the time of his surgery.  At present, he continues using 1-2 pads per day on average.  He denies dysuria, gross hematuria, weak stream, straining to void or incomplete emptying.  He denies abdominal pain, nausea, vomiting or diarrhea.  He is unable to complete sexual activity with most all attempts since surgery. A complete review of systems is obtained and is otherwise negative.   PHYSICAL EXAM:  Wt Readings from Last 3 Encounters:  10/11/17 184 lb 12.8 oz (83.8 kg)  05/30/17 183 lb (83 kg)  05/26/17 183 lb 8 oz (83.2 kg)   Temp Readings from Last 3 Encounters:  10/11/17 (!) 97.4 F (36.3 C) (Oral)  05/31/17 97.8 F (36.6 C) (Oral)  05/26/17 98.8 F (37.1 C) (Oral)   BP Readings from Last 3 Encounters:  10/11/17 137/81  05/31/17 126/71  05/26/17 104/73   Pulse Readings from Last 3 Encounters:  10/11/17 (!) 56  05/31/17 63  05/26/17 81   Pain Assessment Pain Score: 0-No pain/10  In general  this is a well appearing gentleman in no acute distress. He is alert and oriented x4 and appropriate throughout the examination. HEENT reveals that the patient is normocephalic, atraumatic. EOMs are intact. PERRLA. Skin is intact without any evidence of gross lesions. Cardiovascular exam reveals a regular rate and rhythm, no clicks rubs or murmurs are auscultated. Chest is clear to auscultation bilaterally. Lymphatic assessment is performed and does not reveal any adenopathy in the cervical, supraclavicular, axillary, or inguinal chains. Abdomen has active bowel sounds in all quadrants and is intact. The abdomen is soft, non tender, non distended. Lower extremities are negative for pretibial pitting edema, deep calf tenderness, cyanosis or clubbing.    KPS = 100  100 - Normal; no complaints; no evidence of disease. 90   - Able to carry on normal activity; minor signs or symptoms of disease. 80   - Normal activity with effort; some signs or symptoms of disease. 108   - Cares for self; unable to carry on normal activity or to do active work. 60   -  Requires occasional assistance, but is able to care for most of his personal needs. 50   - Requires considerable assistance and frequent medical care. 31   - Disabled; requires special care and assistance. 57   - Severely disabled; hospital admission is indicated although death not imminent. 50   - Very sick; hospital admission necessary; active supportive treatment necessary. 10   - Moribund; fatal processes progressing rapidly. 0     - Dead  Karnofsky DA, Abelmann [place], Craver LS and Burchenal [facility] [phone]) The use of the nitrogen mustards in the palliative treatment of carcinoma: with particular reference to bronchogenic carcinoma Cancer 1 634-56   LABORATORY DATA:  Lab Results  Component Value Date   WBC 5.5 05/26/2017   HGB 13.2 05/31/2017   HCT 37.2 (L) 05/31/2017   MCV 87.9 05/26/2017   PLT 129 (L) 05/26/2017   Lab Results  Component Value Date     NA 141 05/26/2017   K 4.9 05/26/2017   CL 105 05/26/2017   CO2 28 05/26/2017   No results found for: ALT, AST, GGT, ALKPHOS, BILITOT   RADIOGRAPHY: No results found.    IMPRESSION/PLAN: 62 y.o. gentleman with detectable PSA of 0.47 s/p RALP with BPLND on 05/30/17 for pT3n, N1, Mx, Gleason 4+5 adenocarcinoma of the prostate with a and a pretreatment PSA of 15.6.    Today, we reviewed the patient's postoperative pathology, PSA trend and workup thus far.  We discussed the natural history of prostate cancer.  We reviewed the the implications of positive margins, extracapsular extension, and seminal vesicle involvement on the risk of prostate cancer recurrence, especially in light of a detectable, rising PSA following prostatectomy.  We discussed radiation treatment directed to the prostatic fossa with regard to the logistics and delivery of external beam radiation treatment.  We discussed and outlined the risks, benefits, short and long-term effects associated with salvage radiotherapy to the prostate fossa. We also detailed the role of ADT in the treatment of recurrent, high risk prostate cancer and outlined the associated side effects that could be expected with this therapy.  He and his family were encouraged to ask questions that were answered to their stated satisfaction.   At the end of the conversation the patient is interested in moving forward with salvage radiotherapy to the prostate fossa and pelvic lymph nodes delivered over the course of 7-1/2 weeks of daily treatments.  At this point, he has not fully regained urinary continence and will continue working with physical therapy and remain diligent in his home pelvic floor training exercises.  He is planning to follow-up with Dr. Alinda Money in approximately 6 to 8 weeks to reassess his readiness to begin salvage radiotherapy.  In the meantime, he plans to start ST-ADT with Dr. Alinda Money later this week and continue for approximately 6 months  duration.  He understands that further use of ADT beyond the 22-month mark will depend on his PSA response after completing salvage radiotherapy.  He and his family appear to have a good understanding of his disease and our recommendations.  They are in agreement with and comfortable with the stated plan.  We will remain in close communication with Dr. Alinda Money regarding readiness to begin salvage radiotherapy.  He knows to call at any time in the interval with any questions or concerns.  More than 50% of today's visit was spent in counseling and/or coordination of care.     Nicholos Johns, PA-C    Tyler Pita, MD  [facility]  Radiation Oncology Direct Dial: [phone]  Fax: [phone] [email or web address]  Skype  LinkedIn    This document serves as a record of services personally performed by Tyler Pita, MD and Ashlyn Bruning PA-C. It was created on their behalf by Delton Coombes, a trained medical scribe. The creation of this record is based on the scribe's personal observations and the provider's statements to them.

## 2017-10-11 NOTE — Progress Notes (Signed)
                               Care Plan Summary  Name: [name] DOB: 1956/01/16   Your Medical Team:   Urologist -  Dr. [name], [facility]  Radiation Oncologist - Dr. Tyler Pita, [facility]   Medical Oncologist - Dr. [name], [place]  Recommendations: 1) Androgen Deprivation (hormone injection) 2) Radiation to the prostate fossa   * These recommendations are based on information available as of today's consult.      Recommendations may change depending on the results of further tests or exams.  Next Steps: 1) Dr. Lynne Logan office will schedule hormone injection  2) Dr. Alinda Money will schedule follow up appointment 6-8 weeks 3) Dr. Johny Shears office will schedule radiation planning and treatments  When appointments need to be scheduled, you will be contacted by [facility] and/or [facility].  Patient provided with business cards for all team members and a copy of "Fall Prevention Patient Safety Plan".  Questions?  Please do not hesitate to call Cira Rue, RN, BSN, OCN at [phone] any questions or concerns.  [name] is your Oncology Nurse Navigator and is available to assist you while you're receiving your medical care at [facility]

## 2017-10-13 NOTE — Progress Notes (Signed)
[facility] Psychosocial Distress Screening Spiritual Care  Met with [name], his wife, and their daughter [name] in [facility] to introduce [facility] team/resources, reviewing distress screen per protocol.  The patient scored a 0 on the Psychosocial Distress Thermometer which indicates minimal distress. Also assessed for distress and other psychosocial needs.   ONCBCN DISTRESS SCREENING 04/29/2017  Screening Type Initial Screening  Distress experienced in past week (1-10) 0  Emotional problem type   Referral to support programs Yes   Mr Bright reports that faith is a huge part of his coping and meaning-making; he trusts in God for care and his family/friends/church for support as needed.   Follow up needed: No. Per family, no other needs at this time, but they are aware of ongoing [facility] team/programming availability as needed/desired.   [place], [place], [facility] Pager [phone] Voicemail [phone]

## 2017-10-18 ENCOUNTER — Telehealth: Payer: Self-pay | Admitting: Medical Oncology

## 2017-10-18 NOTE — Telephone Encounter (Signed)
Spoke with [name] as follow up to [facility]. He is scheduled tomorrow at [facility] for ADT. He will be scheduled for radiation after his urinary continence improves. I asked him to call with questions or concerns.

## 2017-10-19 ENCOUNTER — Encounter: Payer: Self-pay | Admitting: Medical Oncology

## 2017-10-19 DIAGNOSIS — C61 Malignant neoplasm of prostate: Secondary | ICD-10-CM | POA: Diagnosis not present

## 2017-10-19 DIAGNOSIS — Z5111 Encounter for antineoplastic chemotherapy: Secondary | ICD-10-CM | POA: Diagnosis not present

## 2017-11-08 DIAGNOSIS — H2513 Age-related nuclear cataract, bilateral: Secondary | ICD-10-CM | POA: Diagnosis not present

## 2017-11-14 DIAGNOSIS — M6281 Muscle weakness (generalized): Secondary | ICD-10-CM | POA: Diagnosis not present

## 2017-11-14 DIAGNOSIS — C61 Malignant neoplasm of prostate: Secondary | ICD-10-CM | POA: Diagnosis not present

## 2017-11-14 DIAGNOSIS — M62838 Other muscle spasm: Secondary | ICD-10-CM | POA: Diagnosis not present

## 2017-11-14 DIAGNOSIS — N393 Stress incontinence (female) (male): Secondary | ICD-10-CM | POA: Diagnosis not present

## 2017-11-14 DIAGNOSIS — K5900 Constipation, unspecified: Secondary | ICD-10-CM | POA: Diagnosis not present

## 2017-11-18 DIAGNOSIS — C61 Malignant neoplasm of prostate: Secondary | ICD-10-CM | POA: Diagnosis not present

## 2017-11-18 DIAGNOSIS — E119 Type 2 diabetes mellitus without complications: Secondary | ICD-10-CM | POA: Diagnosis not present

## 2017-11-18 DIAGNOSIS — Z6827 Body mass index (BMI) 27.0-27.9, adult: Secondary | ICD-10-CM | POA: Diagnosis not present

## 2017-11-22 DIAGNOSIS — C61 Malignant neoplasm of prostate: Secondary | ICD-10-CM | POA: Diagnosis not present

## 2017-11-22 DIAGNOSIS — C775 Secondary and unspecified malignant neoplasm of intrapelvic lymph nodes: Secondary | ICD-10-CM | POA: Diagnosis not present

## 2017-12-08 DIAGNOSIS — Z1389 Encounter for screening for other disorder: Secondary | ICD-10-CM | POA: Diagnosis not present

## 2017-12-08 DIAGNOSIS — C61 Malignant neoplasm of prostate: Secondary | ICD-10-CM | POA: Diagnosis not present

## 2017-12-08 DIAGNOSIS — Z6827 Body mass index (BMI) 27.0-27.9, adult: Secondary | ICD-10-CM | POA: Diagnosis not present

## 2017-12-08 DIAGNOSIS — Z23 Encounter for immunization: Secondary | ICD-10-CM | POA: Diagnosis not present

## 2017-12-08 DIAGNOSIS — E119 Type 2 diabetes mellitus without complications: Secondary | ICD-10-CM | POA: Diagnosis not present

## 2017-12-08 DIAGNOSIS — I872 Venous insufficiency (chronic) (peripheral): Secondary | ICD-10-CM | POA: Diagnosis not present

## 2017-12-12 DIAGNOSIS — L570 Actinic keratosis: Secondary | ICD-10-CM | POA: Diagnosis not present

## 2017-12-12 DIAGNOSIS — D225 Melanocytic nevi of trunk: Secondary | ICD-10-CM | POA: Diagnosis not present

## 2017-12-12 DIAGNOSIS — Z85828 Personal history of other malignant neoplasm of skin: Secondary | ICD-10-CM | POA: Diagnosis not present

## 2017-12-12 DIAGNOSIS — L821 Other seborrheic keratosis: Secondary | ICD-10-CM | POA: Diagnosis not present

## 2017-12-12 DIAGNOSIS — D1801 Hemangioma of skin and subcutaneous tissue: Secondary | ICD-10-CM | POA: Diagnosis not present

## 2017-12-14 DIAGNOSIS — C61 Malignant neoplasm of prostate: Secondary | ICD-10-CM | POA: Diagnosis not present

## 2017-12-20 DIAGNOSIS — C61 Malignant neoplasm of prostate: Secondary | ICD-10-CM | POA: Diagnosis not present

## 2017-12-20 DIAGNOSIS — C775 Secondary and unspecified malignant neoplasm of intrapelvic lymph nodes: Secondary | ICD-10-CM | POA: Diagnosis not present

## 2018-02-22 DIAGNOSIS — N393 Stress incontinence (female) (male): Secondary | ICD-10-CM | POA: Diagnosis not present

## 2018-02-22 DIAGNOSIS — M6281 Muscle weakness (generalized): Secondary | ICD-10-CM | POA: Diagnosis not present

## 2018-02-22 DIAGNOSIS — M62838 Other muscle spasm: Secondary | ICD-10-CM | POA: Diagnosis not present

## 2018-02-22 DIAGNOSIS — K5900 Constipation, unspecified: Secondary | ICD-10-CM | POA: Diagnosis not present

## 2018-04-17 DIAGNOSIS — C61 Malignant neoplasm of prostate: Secondary | ICD-10-CM | POA: Diagnosis not present

## 2018-04-17 DIAGNOSIS — M6281 Muscle weakness (generalized): Secondary | ICD-10-CM | POA: Diagnosis not present

## 2018-04-17 DIAGNOSIS — K5900 Constipation, unspecified: Secondary | ICD-10-CM | POA: Diagnosis not present

## 2018-04-17 DIAGNOSIS — M62838 Other muscle spasm: Secondary | ICD-10-CM | POA: Diagnosis not present

## 2018-04-17 DIAGNOSIS — N393 Stress incontinence (female) (male): Secondary | ICD-10-CM | POA: Diagnosis not present

## 2018-04-25 DIAGNOSIS — N393 Stress incontinence (female) (male): Secondary | ICD-10-CM | POA: Diagnosis not present

## 2018-04-25 DIAGNOSIS — C775 Secondary and unspecified malignant neoplasm of intrapelvic lymph nodes: Secondary | ICD-10-CM | POA: Diagnosis not present

## 2018-04-25 DIAGNOSIS — C61 Malignant neoplasm of prostate: Secondary | ICD-10-CM | POA: Diagnosis not present

## 2018-05-09 ENCOUNTER — Other Ambulatory Visit: Payer: Self-pay

## 2018-05-09 ENCOUNTER — Ambulatory Visit
Admission: RE | Admit: 2018-05-09 | Discharge: 2018-05-09 | Disposition: A | Discharge status: Home / Self Care | Payer: BLUE CROSS/BLUE SHIELD | Source: Ambulatory Visit | Attending: Radiation Oncology | Admitting: Radiation Oncology

## 2018-05-09 ENCOUNTER — Ambulatory Visit
Admission: RE | Admit: 2018-05-09 | Discharge: 2018-05-09 | Disposition: A | Discharge status: Home / Self Care | Payer: BLUE CROSS/BLUE SHIELD | Source: Ambulatory Visit | Attending: Urology | Admitting: Urology

## 2018-05-09 ENCOUNTER — Encounter: Payer: Self-pay | Admitting: Medical Oncology

## 2018-05-09 ENCOUNTER — Encounter: Payer: Self-pay | Admitting: Urology

## 2018-05-09 VITALS — BP 146/92 | HR 70 | Temp 98.0°F | Resp 18 | Ht 69.0 in | Wt 191.1 lb

## 2018-05-09 DIAGNOSIS — C61 Malignant neoplasm of prostate: Secondary | ICD-10-CM

## 2018-05-09 DIAGNOSIS — M1990 Unspecified osteoarthritis, unspecified site: Secondary | ICD-10-CM | POA: Diagnosis not present

## 2018-05-09 DIAGNOSIS — Z86718 Personal history of other venous thrombosis and embolism: Secondary | ICD-10-CM | POA: Insufficient documentation

## 2018-05-09 DIAGNOSIS — Z809 Family history of malignant neoplasm, unspecified: Secondary | ICD-10-CM | POA: Insufficient documentation

## 2018-05-09 DIAGNOSIS — Z7984 Long term (current) use of oral hypoglycemic drugs: Secondary | ICD-10-CM | POA: Insufficient documentation

## 2018-05-09 DIAGNOSIS — Z9079 Acquired absence of other genital organ(s): Secondary | ICD-10-CM | POA: Diagnosis not present

## 2018-05-09 DIAGNOSIS — N393 Stress incontinence (female) (male): Secondary | ICD-10-CM | POA: Diagnosis not present

## 2018-05-09 DIAGNOSIS — E119 Type 2 diabetes mellitus without complications: Secondary | ICD-10-CM | POA: Diagnosis not present

## 2018-05-09 DIAGNOSIS — R9721 Rising PSA following treatment for malignant neoplasm of prostate: Secondary | ICD-10-CM | POA: Diagnosis not present

## 2018-05-09 DIAGNOSIS — Z79899 Other long term (current) drug therapy: Secondary | ICD-10-CM | POA: Insufficient documentation

## 2018-05-09 DIAGNOSIS — Z79818 Long term (current) use of other agents affecting estrogen receptors and estrogen levels: Secondary | ICD-10-CM | POA: Diagnosis not present

## 2018-05-09 HISTORY — DX: Type 2 diabetes mellitus without complications: E11.9

## 2018-05-09 NOTE — Progress Notes (Signed)
Radiation Oncology         [phone] ________________________________  Follow Up New  Name: [name] MRN: [id number]  Date: 05/09/2018  DOB: [birth year]  ZH:YQMV, [name], MD  Mark Bring, MD   REFERRING PHYSICIAN: [name], MD  DIAGNOSIS: 63 y.o. gentleman with detectable PSA of 0.47 s/p RALP with BPLND on 05/30/17 for stage T1c adenocarcinoma of the prostate with a Gleason's score of 4+5 and a pretreatment PSA of 15.6.    ICD-10-CM   1. Malignant neoplasm of prostate ([place]) [place] referral to Social Work    [name] is a 63 y.o. gentleman. He is an established patient with [facility] s/p RALP with BPLND on 05/30/17 for Gleason 4+5, stage pT3b N1 Mx adenocarcinoma of the prostate. Final surgical  pathology revealed positive surgical margins, 4/29 lymph nodes positive, and a post-treatment PSA of 0.47 in 09/2017. He was seen in the multidisciplinary prostate conference in July 2019 and the recommendation was to proceed with salvage radiotherapy to the prostate fossa.  At that time, he opted to start ADT to give some additional time to continue working with PT to regain bladder continence. He started ST-ADT on 10/19/2017, and his PSA remains undetectable as of 04/17/2018. He received his most recent 6 month Lupron injection on 04/25/2018.  He reports that he has regained approximately 95% bladder continence at this point but still wears a pad in his shorts for protection due to SUI with lifting/bending, cough, sneeze or laugh. This is small volume leakage at this point and he feels like he has not made any additional progress over the past 6 months.  He feels like he can live with the SUI at this point and presents back today to discuss moving forward with salvage XRT to the prostate fossa.   PREVIOUS RADIATION THERAPY: No  PAST MEDICAL HISTORY:  has a past medical history of Arthritis, Diabetes mellitus without complication ([place])  (7846), Internal hemorrhoids (2014), Osteoarthritis of both knees, Pre-diabetes, Prostate cancer ([place]), and Sigmoid diverticulosis (2014).    PAST SURGICAL HISTORY: Past Surgical History:  Procedure Laterality Date  . COLONOSCOPY N/A 09/13/2012   Procedure: COLONOSCOPY;  Surgeon: Danie Binder, MD;  [facility];  Service: Endoscopy;  Laterality: N/A;  9:30 AM  . HERNIA REPAIR     20 years ago  . KNEE ARTHROSCOPY Left    15 years ago  . LYMPHADENECTOMY Bilateral 05/30/2017   Procedure: LYMPHADENECTOMY, PELVIC EXTENDED;  Surgeon: Mark Bring, MD;  Location[facility];  Service: Urology;  Laterality: Bilateral;  . ROBOT ASSISTED LAPAROSCOPIC RADICAL PROSTATECTOMY N/A 05/30/2017   Procedure: XI ROBOTIC ASSISTED LAPAROSCOPIC RADICAL PROSTATECTOMY LEVEL 3;  Surgeon: Mark Bring, MD;  Location[facility];  Service: Urology;  Laterality: N/A;  . SHOULDER ARTHROSCOPY Right    15 years ago    FAMILY HISTORY: family history includes Cancer in his mother.  SOCIAL HISTORY:  reports that he has never smoked. He has never used smokeless tobacco. He reports that he does not drink alcohol or use drugs.  ALLERGIES: Patient has no known allergies.  MEDICATIONS:  Current Outpatient Medications  Medication Sig Dispense Refill  . acetaminophen (TYLENOL) 325 MG tablet Take 650 mg by mouth every 6 (six) hours as needed.    Marland Kitchen ibuprofen (ADVIL,MOTRIN) 200 MG tablet Take 200 mg by mouth every 6 (six) hours as needed.    . metFORMIN (GLUCOPHAGE-XR) 500 MG 24 hr tablet Take 500 mg by mouth  3 (three) times daily with meals.     No current facility-administered medications for this encounter.     REVIEW OF SYSTEMS:  On review of systems, the patient reports that he is doing well overall. He denies any chest pain, shortness of breath, cough, fevers, chills, night sweats, unintended weight changes. He denies any bowel disturbances, and denies abdominal pain, nausea or vomiting. He denies any new  musculoskeletal or joint aches or pains. His IPSS was 9, indicating moderate urinary symptoms. He reports his urinary continence is at 95%, and he feels he is not making further improvement despite continued efforts at this point. He reports leakage and post void dribbling with activity, cough, sneeze and laughing. He also reports nocturia x2-3. His SHIM was 21, indicating he has mild erectile dysfunction. A complete review of systems is obtained and is otherwise negative.  PHYSICAL EXAM:  Wt Readings from Last 3 Encounters:  05/09/18 191 lb 2 oz (86.7 kg)  10/11/17 184 lb 12.8 oz (83.8 kg)  05/30/17 183 lb (83 kg)   Temp Readings from Last 3 Encounters:  05/09/18 98 F (36.7 C) (Oral)  10/11/17 (!) 97.4 F (36.3 C) (Oral)  05/31/17 97.8 F (36.6 C) (Oral)   BP Readings from Last 3 Encounters:  05/09/18 (!) 146/92  10/11/17 137/81  05/31/17 126/71   Pulse Readings from Last 3 Encounters:  05/09/18 70  10/11/17 (!) 56  05/31/17 63   Pain Assessment Pain Score: 0-No pain/10  In general this is a well appearing Caucasian gentleman in no acute distress. He is alert and oriented x4 and appropriate throughout the examination. HEENT reveals that the patient is normocephalic, atraumatic. EOMs are intact. PERRLA. Skin is intact without any evidence of gross lesions. Cardiovascular exam reveals a regular rate and rhythm, no clicks rubs or murmurs are auscultated. Chest is clear to auscultation bilaterally. Lymphatic assessment is performed and does not reveal any adenopathy in the cervical, supraclavicular, axillary, or inguinal chains. Abdomen has active bowel sounds in all quadrants and is intact. The abdomen is soft, non tender, non distended. Lower extremities are negative for pretibial pitting edema, deep calf tenderness, cyanosis or clubbing.    KPS = 90  100 - Normal; no complaints; no evidence of disease. 90   - Able to carry on normal activity; minor signs or symptoms of disease. 80    - Normal activity with effort; some signs or symptoms of disease. 35   - Cares for self; unable to carry on normal activity or to do active work. 60   - Requires occasional assistance, but is able to care for most of his personal needs. 50   - Requires considerable assistance and frequent medical care. 80   - Disabled; requires special care and assistance. 31   - Severely disabled; hospital admission is indicated although death not imminent. 15   - Very sick; hospital admission necessary; active supportive treatment necessary. 10   - Moribund; fatal processes progressing rapidly. 0     - Dead  Karnofsky DA, Abelmann [place], Craver LS and Burchenal [facility] [phone]) The use of the nitrogen mustards in the palliative treatment of carcinoma: with particular reference to bronchogenic carcinoma Cancer 1 634-56   LABORATORY DATA:  Lab Results  Component Value Date   WBC 5.5 05/26/2017   HGB 13.2 05/31/2017   HCT 37.2 (L) 05/31/2017   MCV 87.9 05/26/2017   PLT 129 (L) 05/26/2017   Lab Results  Component Value Date   NA 141  05/26/2017   K 4.9 05/26/2017   CL 105 05/26/2017   CO2 28 05/26/2017   No results found for: ALT, AST, GGT, ALKPHOS, BILITOT   RADIOGRAPHY: No results found.    IMPRESSION/PLAN: 63 y.o. gentleman with detectable PSA of 0.47 s/p RALP with BPLND on 05/30/17 for Gleason 4+5, pT3n, N1, Mx adenocarcinoma of the prostate with a pretreatment PSA of 15.6.   Today, we reviewed the patient's postoperative pathology, PSA trend and workup thus far.  We discussed the natural history of prostate cancer and reviewed the the implications of positive margins, extracapsular extension, and seminal vesicle involvement on the risk of prostate cancer recurrence, especially in light of a detectable, rising PSA following prostatectomy.  We reviewed radiation treatment directed to the prostatic fossa with regard to the logistics and delivery of EBRT.  The recommendation is for a 7.5 week course of salvage  radiotherapy to the prostate fossa.  We discussed and outlined the risks, benefits, short and long-term effects associated with radiation in this setting. He is on ADT, which was started in July 2019, with his most recent 6 month injection given in February 2020. He continues to tolerate this well.  He and his wife were encouraged to ask questions that were answered to their stated satisfaction.  At the end of the conversation the patient is interested in moving forward with salvage radiotherapy to the prostate fossa and pelvic lymph nodes in combination with ADT. He has freely signed written consent to proceed today in the office and a copy of this document has been placed in his medical record.  He is scheduled for CT SIM/treatment planning on 05/16/18 at 9am in anticipation of beginning treatment in the near future.  We will share our discussion with Dr. Alinda Money and move forward with treatment planning accordingly.   More than 50% of today's visit was spent in counseling and/or coordination of care.     Nicholos Johns, PA-C    Tyler Pita, MD  [facility] Direct Dial: [phone]  Fax: [phone] [email or web address]  Skype  LinkedIn   This document serves as a record of services personally performed by Tyler Pita, MD and Freeman Caldron, PA-C. It was created on their behalf by Wilburn Mylar, a trained medical scribe. The creation of this record is based on the scribe's personal observations and the provider's statements to them. This document has been checked and approved by the attending provider.

## 2018-05-09 NOTE — Progress Notes (Signed)
[name] states he is doing well except for side effects of Lupron. His energy is good but he is having terrible hot flashes especially at night that has impacted his sleep. He followed up with Dr. Alinda Money recently and he has prescribed medication to help with side effects. He has appointment with his primary MD and he is going to request a sleep aid. He states his urinary function has improved and is ready to discuss salvage radiation. I encouraged him to call me with questions or concerns. He voiced understanding.

## 2018-05-09 NOTE — Progress Notes (Signed)
GU Location of Tumor / Histology: Prostate  If Prostate Cancer, Gleason Score is ( + ) and PSA is ()   [name] presented months ago with signs/symptoms of:    Biopsies of (if applicable) revealed:    Past/Anticipated interventions by urology, if any: 05/30/17 Dr Alinda Money  Procedure Laterality Anesthesia  XI ROBOTIC ASSISTED LAPAROSCOPIC RADICAL PROSTATECTOMY LEVEL 3 N/A General  LYMPHADENECTOMY, PELVIC EXTENDED      Past/Anticipated interventions by medical oncology, if any:   Weight changes, if any: no  Bowel/Bladder complaints, if any: Leaking  Nausea/Vomiting, if any: No  Pain issues, if any:  No  SAFETY ISSUES:  Prior radiation? No  Pacemaker/ICD? No  Possible current pregnancy? N/A  Is the patient on methotrexate? N/A    Current Complaints / other details: Patient is having issues with sleeping, patient unable to sleep at night.

## 2018-05-10 ENCOUNTER — Encounter: Payer: Self-pay | Admitting: General Practice

## 2018-05-10 NOTE — Progress Notes (Signed)
[facility] Psychosocial Distress Screening Clinical Social Work  Clinical Social Work was referred by distress screening protocol.  The patient scored a 5 on the Psychosocial Distress Thermometer which indicates moderate distress. Clinical Social Worker contacted patient by phone to assess for distress and other psychosocial needs.  No current concerns, is doing well, understands treatment plan and feels confident in plan.  No issues identified, brief explanation of Support Center given and patient encouraged to call for support/resources as needed in future.    [facility] DISTRESS SCREENING 05/09/2018  Screening Type Initial Screening  Distress experienced in past week (1-10) 5  Emotional problem type Adjusting to illness  Information Concerns Type Lack of info about diagnosis  Physical Problem type Sleep/insomnia;Constipation/diarrhea;Changes in urination;Sexual problems  Physician notified of physical symptoms No  Referral to clinical psychology No  Referral to clinical social work Yes  Referral to dietition No  Referral to financial advocate No  Referral to support programs No  Referral to palliative care No     Clinical Social Worker follow up needed: No.  If yes, follow up plan:  Beverely Pace, [place], LCSW Clinical Social Worker Phone:  [phone]

## 2018-05-11 DIAGNOSIS — I872 Venous insufficiency (chronic) (peripheral): Secondary | ICD-10-CM | POA: Diagnosis not present

## 2018-05-11 DIAGNOSIS — E119 Type 2 diabetes mellitus without complications: Secondary | ICD-10-CM | POA: Diagnosis not present

## 2018-05-11 DIAGNOSIS — I10 Essential (primary) hypertension: Secondary | ICD-10-CM | POA: Diagnosis not present

## 2018-05-11 DIAGNOSIS — C61 Malignant neoplasm of prostate: Secondary | ICD-10-CM | POA: Diagnosis not present

## 2018-05-16 ENCOUNTER — Ambulatory Visit
Admission: RE | Admit: 2018-05-16 | Discharge: 2018-05-16 | Disposition: A | Discharge status: Home / Self Care | Payer: BLUE CROSS/BLUE SHIELD | Source: Ambulatory Visit | Attending: Radiation Oncology | Admitting: Radiation Oncology

## 2018-05-16 DIAGNOSIS — Z510 Encounter for antineoplastic radiation therapy: Secondary | ICD-10-CM | POA: Diagnosis not present

## 2018-05-16 DIAGNOSIS — C61 Malignant neoplasm of prostate: Secondary | ICD-10-CM | POA: Diagnosis not present

## 2018-05-16 NOTE — Addendum Note (Signed)
Encounter addended by: Tyler Pita, MD on: 05/16/2018 4:01 PM  Actions taken: Medication List reviewed, Problem List reviewed, Allergies reviewed

## 2018-05-16 NOTE — Progress Notes (Signed)
  Radiation Oncology         (336) [phone] ________________________________  Name: [name] MRN: [id number]  Date: 05/16/2018  DOB: [birth year]  SIMULATION AND TREATMENT PLANNING NOTE    ICD-10-CM   1. Malignant neoplasm of prostate ([place]) C61     DIAGNOSIS:  63 y.o. gentleman with detectable PSA of 0.47 s/p RALP with BPLND on 05/30/17 for stage T1c adenocarcinoma of the prostate with a Gleason's score of 4+5   NARRATIVE:  The patient was brought to the [facility].  Identity was confirmed.  All relevant records and images related to the planned course of therapy were reviewed.  The patient freely provided informed written consent to proceed with treatment after reviewing the details related to the planned course of therapy. The consent form was witnessed and verified by the simulation staff.  Then, the patient was set-up in a stable reproducible supine position for radiation therapy.  A vacuum lock pillow device was custom fabricated to position his legs in a reproducible immobilized position.  Then, I performed a urethrogram under sterile conditions to identify the prostatic bed.  CT images were obtained.  Surface markings were placed.  The CT images were loaded into the planning software.  Then the prostate bed target, pelvic lymph node target and avoidance structures including the rectum, bladder, bowel and hips were contoured.  Treatment planning then occurred.  The radiation prescription was entered and confirmed.  A total of one complex treatment devices were fabricated. I have requested : Intensity Modulated Radiotherapy (IMRT) is medically necessary for this case for the following reason:  Rectal sparing.Marland Kitchen  PLAN:  The patient will receive 45 Gy in 25 fractions of 1.8 Gy, followed by a boost to the prostate bed to a total dose of 68.4 Gy with 13 additional fractions of 1.8 Gy.   ________________________________  Sheral Apley Tammi Klippel, M.D.

## 2018-05-26 DIAGNOSIS — C61 Malignant neoplasm of prostate: Secondary | ICD-10-CM | POA: Diagnosis not present

## 2018-05-26 DIAGNOSIS — Z510 Encounter for antineoplastic radiation therapy: Secondary | ICD-10-CM | POA: Diagnosis not present

## 2018-05-29 ENCOUNTER — Ambulatory Visit
Admission: RE | Admit: 2018-05-29 | Discharge: 2018-05-29 | Disposition: A | Discharge status: Home / Self Care | Payer: BLUE CROSS/BLUE SHIELD | Source: Ambulatory Visit | Attending: Radiation Oncology | Admitting: Radiation Oncology

## 2018-05-29 ENCOUNTER — Encounter: Payer: Self-pay | Admitting: Medical Oncology

## 2018-05-29 DIAGNOSIS — C61 Malignant neoplasm of prostate: Secondary | ICD-10-CM | POA: Diagnosis not present

## 2018-05-29 DIAGNOSIS — Z510 Encounter for antineoplastic radiation therapy: Secondary | ICD-10-CM | POA: Diagnosis not present

## 2018-05-30 ENCOUNTER — Ambulatory Visit
Admission: RE | Admit: 2018-05-30 | Discharge: 2018-05-30 | Disposition: A | Discharge status: Home / Self Care | Payer: BLUE CROSS/BLUE SHIELD | Source: Ambulatory Visit | Attending: Radiation Oncology | Admitting: Radiation Oncology

## 2018-05-30 DIAGNOSIS — Z510 Encounter for antineoplastic radiation therapy: Secondary | ICD-10-CM | POA: Diagnosis not present

## 2018-05-30 DIAGNOSIS — C61 Malignant neoplasm of prostate: Secondary | ICD-10-CM | POA: Diagnosis not present

## 2018-05-31 ENCOUNTER — Ambulatory Visit
Admission: RE | Admit: 2018-05-31 | Discharge: 2018-05-31 | Disposition: A | Discharge status: Home / Self Care | Payer: BLUE CROSS/BLUE SHIELD | Source: Ambulatory Visit | Attending: Radiation Oncology | Admitting: Radiation Oncology

## 2018-05-31 DIAGNOSIS — C61 Malignant neoplasm of prostate: Secondary | ICD-10-CM | POA: Diagnosis not present

## 2018-05-31 DIAGNOSIS — Z510 Encounter for antineoplastic radiation therapy: Secondary | ICD-10-CM | POA: Diagnosis not present

## 2018-06-01 ENCOUNTER — Telehealth: Payer: Self-pay | Admitting: Radiation Oncology

## 2018-06-01 ENCOUNTER — Ambulatory Visit
Admission: RE | Admit: 2018-06-01 | Discharge: 2018-06-01 | Disposition: A | Discharge status: Home / Self Care | Payer: BLUE CROSS/BLUE SHIELD | Source: Ambulatory Visit | Attending: Radiation Oncology | Admitting: Radiation Oncology

## 2018-06-01 DIAGNOSIS — Z510 Encounter for antineoplastic radiation therapy: Secondary | ICD-10-CM | POA: Diagnosis not present

## 2018-06-01 DIAGNOSIS — C61 Malignant neoplasm of prostate: Secondary | ICD-10-CM | POA: Diagnosis not present

## 2018-06-01 NOTE — Telephone Encounter (Signed)
Received voicemail message from patient's wife, Neoma Laming, requesting a return call. Phoned Deborah back. She inquired about cream and ADT. Explained that we ask no creams, lotions or powder be applied to the treatment field four hours prior to daily treatment. Requested the area remain clean and dry. Explained ADT can contribute to depression, lower heart rate, lack of stamina, fatigue and hot flashes. Also, explained that post sim education with be done with her husband after his scheduled radiation treatment tomorrow. Added that he will come home with a book reviewing potential side effects and they can read together. She verbalized understanding and expressed appreciation for the return call.

## 2018-06-02 ENCOUNTER — Other Ambulatory Visit: Payer: Self-pay

## 2018-06-02 ENCOUNTER — Ambulatory Visit
Admission: RE | Admit: 2018-06-02 | Discharge: 2018-06-02 | Disposition: A | Discharge status: Home / Self Care | Payer: BLUE CROSS/BLUE SHIELD | Source: Ambulatory Visit | Attending: Radiation Oncology | Admitting: Radiation Oncology

## 2018-06-02 DIAGNOSIS — C61 Malignant neoplasm of prostate: Secondary | ICD-10-CM | POA: Diagnosis not present

## 2018-06-02 DIAGNOSIS — Z510 Encounter for antineoplastic radiation therapy: Secondary | ICD-10-CM | POA: Diagnosis not present

## 2018-06-05 ENCOUNTER — Ambulatory Visit
Admission: RE | Admit: 2018-06-05 | Discharge: 2018-06-05 | Disposition: A | Discharge status: Home / Self Care | Payer: BLUE CROSS/BLUE SHIELD | Source: Ambulatory Visit | Attending: Radiation Oncology | Admitting: Radiation Oncology

## 2018-06-05 ENCOUNTER — Other Ambulatory Visit: Payer: Self-pay

## 2018-06-05 DIAGNOSIS — Z510 Encounter for antineoplastic radiation therapy: Secondary | ICD-10-CM | POA: Diagnosis not present

## 2018-06-05 DIAGNOSIS — C61 Malignant neoplasm of prostate: Secondary | ICD-10-CM | POA: Diagnosis not present

## 2018-06-06 ENCOUNTER — Other Ambulatory Visit: Payer: Self-pay

## 2018-06-06 ENCOUNTER — Ambulatory Visit
Admission: RE | Admit: 2018-06-06 | Discharge: 2018-06-06 | Disposition: A | Discharge status: Home / Self Care | Payer: BLUE CROSS/BLUE SHIELD | Source: Ambulatory Visit | Attending: Radiation Oncology | Admitting: Radiation Oncology

## 2018-06-06 DIAGNOSIS — C61 Malignant neoplasm of prostate: Secondary | ICD-10-CM | POA: Diagnosis not present

## 2018-06-06 DIAGNOSIS — Z510 Encounter for antineoplastic radiation therapy: Secondary | ICD-10-CM | POA: Diagnosis not present

## 2018-06-07 ENCOUNTER — Ambulatory Visit
Admission: RE | Admit: 2018-06-07 | Discharge: 2018-06-07 | Disposition: A | Discharge status: Home / Self Care | Payer: BLUE CROSS/BLUE SHIELD | Source: Ambulatory Visit | Attending: Radiation Oncology | Admitting: Radiation Oncology

## 2018-06-07 ENCOUNTER — Other Ambulatory Visit: Payer: Self-pay

## 2018-06-07 DIAGNOSIS — Z510 Encounter for antineoplastic radiation therapy: Secondary | ICD-10-CM | POA: Diagnosis not present

## 2018-06-07 DIAGNOSIS — C61 Malignant neoplasm of prostate: Secondary | ICD-10-CM | POA: Diagnosis not present

## 2018-06-08 ENCOUNTER — Other Ambulatory Visit: Payer: Self-pay

## 2018-06-08 ENCOUNTER — Ambulatory Visit
Admission: RE | Admit: 2018-06-08 | Discharge: 2018-06-08 | Disposition: A | Discharge status: Home / Self Care | Payer: BLUE CROSS/BLUE SHIELD | Source: Ambulatory Visit | Attending: Radiation Oncology | Admitting: Radiation Oncology

## 2018-06-08 DIAGNOSIS — Z510 Encounter for antineoplastic radiation therapy: Secondary | ICD-10-CM | POA: Diagnosis not present

## 2018-06-08 DIAGNOSIS — C61 Malignant neoplasm of prostate: Secondary | ICD-10-CM | POA: Diagnosis not present

## 2018-06-09 ENCOUNTER — Ambulatory Visit
Admission: RE | Admit: 2018-06-09 | Discharge: 2018-06-09 | Disposition: A | Discharge status: Home / Self Care | Payer: BLUE CROSS/BLUE SHIELD | Source: Ambulatory Visit | Attending: Radiation Oncology | Admitting: Radiation Oncology

## 2018-06-09 ENCOUNTER — Other Ambulatory Visit: Payer: Self-pay

## 2018-06-09 DIAGNOSIS — C61 Malignant neoplasm of prostate: Secondary | ICD-10-CM | POA: Diagnosis not present

## 2018-06-09 DIAGNOSIS — Z510 Encounter for antineoplastic radiation therapy: Secondary | ICD-10-CM | POA: Diagnosis not present

## 2018-06-12 ENCOUNTER — Ambulatory Visit
Admission: RE | Admit: 2018-06-12 | Discharge: 2018-06-12 | Disposition: A | Discharge status: Home / Self Care | Payer: BLUE CROSS/BLUE SHIELD | Source: Ambulatory Visit | Attending: Radiation Oncology | Admitting: Radiation Oncology

## 2018-06-12 ENCOUNTER — Other Ambulatory Visit: Payer: Self-pay

## 2018-06-12 DIAGNOSIS — Z510 Encounter for antineoplastic radiation therapy: Secondary | ICD-10-CM | POA: Diagnosis not present

## 2018-06-12 DIAGNOSIS — C61 Malignant neoplasm of prostate: Secondary | ICD-10-CM | POA: Diagnosis not present

## 2018-06-13 ENCOUNTER — Other Ambulatory Visit: Payer: Self-pay

## 2018-06-13 ENCOUNTER — Ambulatory Visit
Admission: RE | Admit: 2018-06-13 | Discharge: 2018-06-13 | Disposition: A | Discharge status: Home / Self Care | Payer: BLUE CROSS/BLUE SHIELD | Source: Ambulatory Visit | Attending: Radiation Oncology | Admitting: Radiation Oncology

## 2018-06-13 DIAGNOSIS — Z510 Encounter for antineoplastic radiation therapy: Secondary | ICD-10-CM | POA: Diagnosis not present

## 2018-06-13 DIAGNOSIS — C61 Malignant neoplasm of prostate: Secondary | ICD-10-CM | POA: Diagnosis not present

## 2018-06-14 ENCOUNTER — Other Ambulatory Visit: Payer: Self-pay

## 2018-06-14 ENCOUNTER — Ambulatory Visit
Admission: RE | Admit: 2018-06-14 | Discharge: 2018-06-14 | Disposition: A | Discharge status: Home / Self Care | Payer: BLUE CROSS/BLUE SHIELD | Source: Ambulatory Visit | Attending: Radiation Oncology | Admitting: Radiation Oncology

## 2018-06-14 DIAGNOSIS — C61 Malignant neoplasm of prostate: Secondary | ICD-10-CM | POA: Diagnosis not present

## 2018-06-14 DIAGNOSIS — Z510 Encounter for antineoplastic radiation therapy: Secondary | ICD-10-CM | POA: Diagnosis not present

## 2018-06-15 ENCOUNTER — Other Ambulatory Visit: Payer: Self-pay

## 2018-06-15 ENCOUNTER — Ambulatory Visit
Admission: RE | Admit: 2018-06-15 | Discharge: 2018-06-15 | Disposition: A | Discharge status: Home / Self Care | Payer: BLUE CROSS/BLUE SHIELD | Source: Ambulatory Visit | Attending: Radiation Oncology | Admitting: Radiation Oncology

## 2018-06-15 DIAGNOSIS — Z510 Encounter for antineoplastic radiation therapy: Secondary | ICD-10-CM | POA: Diagnosis not present

## 2018-06-15 DIAGNOSIS — C61 Malignant neoplasm of prostate: Secondary | ICD-10-CM | POA: Diagnosis not present

## 2018-06-16 ENCOUNTER — Other Ambulatory Visit: Payer: Self-pay

## 2018-06-16 ENCOUNTER — Ambulatory Visit
Admission: RE | Admit: 2018-06-16 | Discharge: 2018-06-16 | Disposition: A | Discharge status: Home / Self Care | Payer: BLUE CROSS/BLUE SHIELD | Source: Ambulatory Visit | Attending: Radiation Oncology | Admitting: Radiation Oncology

## 2018-06-16 DIAGNOSIS — Z510 Encounter for antineoplastic radiation therapy: Secondary | ICD-10-CM | POA: Diagnosis not present

## 2018-06-16 DIAGNOSIS — C61 Malignant neoplasm of prostate: Secondary | ICD-10-CM | POA: Diagnosis not present

## 2018-06-19 ENCOUNTER — Other Ambulatory Visit: Payer: Self-pay

## 2018-06-19 ENCOUNTER — Ambulatory Visit
Admission: RE | Admit: 2018-06-19 | Discharge: 2018-06-19 | Disposition: A | Discharge status: Home / Self Care | Payer: BLUE CROSS/BLUE SHIELD | Source: Ambulatory Visit | Attending: Radiation Oncology | Admitting: Radiation Oncology

## 2018-06-19 DIAGNOSIS — C61 Malignant neoplasm of prostate: Secondary | ICD-10-CM | POA: Diagnosis not present

## 2018-06-19 DIAGNOSIS — Z510 Encounter for antineoplastic radiation therapy: Secondary | ICD-10-CM | POA: Diagnosis not present

## 2018-06-20 ENCOUNTER — Ambulatory Visit
Admission: RE | Admit: 2018-06-20 | Discharge: 2018-06-20 | Disposition: A | Discharge status: Home / Self Care | Payer: BLUE CROSS/BLUE SHIELD | Source: Ambulatory Visit | Attending: Radiation Oncology | Admitting: Radiation Oncology

## 2018-06-20 ENCOUNTER — Telehealth: Payer: Self-pay | Admitting: Radiation Oncology

## 2018-06-20 ENCOUNTER — Other Ambulatory Visit: Payer: Self-pay

## 2018-06-20 DIAGNOSIS — C61 Malignant neoplasm of prostate: Secondary | ICD-10-CM | POA: Diagnosis not present

## 2018-06-20 DIAGNOSIS — Z510 Encounter for antineoplastic radiation therapy: Secondary | ICD-10-CM | POA: Diagnosis not present

## 2018-06-20 NOTE — Telephone Encounter (Signed)
Received voicemail message from patient's wife requesting a return call. Phoned back promptly. Explained it will not harm anything for her husband to ride his lawnmower even though he is actively receiving radiation therapy for prostate ca. Encouraged he sit on a pillow or donut pillow to offset the pressure on his prostate while riding the lawn mower. Also, explained that riding the mower for a prolonged period may cause his prostate to swell thus creating a weak urine stream, urinary hesitancy and urinary intermittency for a few days thereafter. Wife verbalized understanding and expressed appreciation for the return call.

## 2018-06-21 ENCOUNTER — Other Ambulatory Visit: Payer: Self-pay

## 2018-06-21 ENCOUNTER — Ambulatory Visit
Admission: RE | Admit: 2018-06-21 | Discharge: 2018-06-21 | Disposition: A | Discharge status: Home / Self Care | Payer: BLUE CROSS/BLUE SHIELD | Source: Ambulatory Visit | Attending: Radiation Oncology | Admitting: Radiation Oncology

## 2018-06-21 DIAGNOSIS — Z510 Encounter for antineoplastic radiation therapy: Secondary | ICD-10-CM | POA: Insufficient documentation

## 2018-06-21 DIAGNOSIS — C61 Malignant neoplasm of prostate: Secondary | ICD-10-CM | POA: Insufficient documentation

## 2018-06-22 ENCOUNTER — Other Ambulatory Visit: Payer: Self-pay

## 2018-06-22 ENCOUNTER — Ambulatory Visit
Admission: RE | Admit: 2018-06-22 | Discharge: 2018-06-22 | Disposition: A | Discharge status: Home / Self Care | Payer: BLUE CROSS/BLUE SHIELD | Source: Ambulatory Visit | Attending: Radiation Oncology | Admitting: Radiation Oncology

## 2018-06-22 DIAGNOSIS — C61 Malignant neoplasm of prostate: Secondary | ICD-10-CM | POA: Diagnosis not present

## 2018-06-22 DIAGNOSIS — Z510 Encounter for antineoplastic radiation therapy: Secondary | ICD-10-CM | POA: Diagnosis not present

## 2018-06-23 ENCOUNTER — Other Ambulatory Visit: Payer: Self-pay

## 2018-06-23 ENCOUNTER — Ambulatory Visit
Admission: RE | Admit: 2018-06-23 | Discharge: 2018-06-23 | Disposition: A | Discharge status: Home / Self Care | Payer: BLUE CROSS/BLUE SHIELD | Source: Ambulatory Visit | Attending: Radiation Oncology | Admitting: Radiation Oncology

## 2018-06-23 DIAGNOSIS — C61 Malignant neoplasm of prostate: Secondary | ICD-10-CM | POA: Diagnosis not present

## 2018-06-23 DIAGNOSIS — Z510 Encounter for antineoplastic radiation therapy: Secondary | ICD-10-CM | POA: Diagnosis not present

## 2018-06-26 ENCOUNTER — Ambulatory Visit
Admission: RE | Admit: 2018-06-26 | Discharge: 2018-06-26 | Disposition: A | Discharge status: Home / Self Care | Payer: BLUE CROSS/BLUE SHIELD | Source: Ambulatory Visit | Attending: Radiation Oncology | Admitting: Radiation Oncology

## 2018-06-26 ENCOUNTER — Other Ambulatory Visit: Payer: Self-pay

## 2018-06-26 DIAGNOSIS — Z510 Encounter for antineoplastic radiation therapy: Secondary | ICD-10-CM | POA: Diagnosis not present

## 2018-06-26 DIAGNOSIS — C61 Malignant neoplasm of prostate: Secondary | ICD-10-CM | POA: Diagnosis not present

## 2018-06-27 ENCOUNTER — Ambulatory Visit
Admission: RE | Admit: 2018-06-27 | Discharge: 2018-06-27 | Disposition: A | Discharge status: Home / Self Care | Payer: BLUE CROSS/BLUE SHIELD | Source: Ambulatory Visit | Attending: Radiation Oncology | Admitting: Radiation Oncology

## 2018-06-27 ENCOUNTER — Other Ambulatory Visit: Payer: Self-pay

## 2018-06-27 DIAGNOSIS — Z510 Encounter for antineoplastic radiation therapy: Secondary | ICD-10-CM | POA: Diagnosis not present

## 2018-06-27 DIAGNOSIS — C61 Malignant neoplasm of prostate: Secondary | ICD-10-CM | POA: Diagnosis not present

## 2018-06-28 ENCOUNTER — Ambulatory Visit
Admission: RE | Admit: 2018-06-28 | Discharge: 2018-06-28 | Disposition: A | Discharge status: Home / Self Care | Payer: BLUE CROSS/BLUE SHIELD | Source: Ambulatory Visit | Attending: Radiation Oncology | Admitting: Radiation Oncology

## 2018-06-28 ENCOUNTER — Other Ambulatory Visit: Payer: Self-pay

## 2018-06-28 DIAGNOSIS — Z510 Encounter for antineoplastic radiation therapy: Secondary | ICD-10-CM | POA: Diagnosis not present

## 2018-06-28 DIAGNOSIS — C61 Malignant neoplasm of prostate: Secondary | ICD-10-CM | POA: Diagnosis not present

## 2018-06-29 ENCOUNTER — Other Ambulatory Visit: Payer: Self-pay

## 2018-06-29 ENCOUNTER — Ambulatory Visit
Admission: RE | Admit: 2018-06-29 | Discharge: 2018-06-29 | Disposition: A | Discharge status: Home / Self Care | Payer: BLUE CROSS/BLUE SHIELD | Source: Ambulatory Visit | Attending: Radiation Oncology | Admitting: Radiation Oncology

## 2018-06-29 DIAGNOSIS — Z510 Encounter for antineoplastic radiation therapy: Secondary | ICD-10-CM | POA: Diagnosis not present

## 2018-06-29 DIAGNOSIS — C61 Malignant neoplasm of prostate: Secondary | ICD-10-CM | POA: Diagnosis not present

## 2018-06-30 ENCOUNTER — Ambulatory Visit
Admission: RE | Admit: 2018-06-30 | Discharge: 2018-06-30 | Disposition: A | Discharge status: Home / Self Care | Payer: BLUE CROSS/BLUE SHIELD | Source: Ambulatory Visit | Attending: Radiation Oncology | Admitting: Radiation Oncology

## 2018-06-30 ENCOUNTER — Other Ambulatory Visit: Payer: Self-pay

## 2018-06-30 DIAGNOSIS — Z510 Encounter for antineoplastic radiation therapy: Secondary | ICD-10-CM | POA: Diagnosis not present

## 2018-06-30 DIAGNOSIS — C61 Malignant neoplasm of prostate: Secondary | ICD-10-CM | POA: Diagnosis not present

## 2018-07-03 ENCOUNTER — Other Ambulatory Visit: Payer: Self-pay

## 2018-07-03 ENCOUNTER — Ambulatory Visit
Admission: RE | Admit: 2018-07-03 | Discharge: 2018-07-03 | Disposition: A | Discharge status: Home / Self Care | Payer: BLUE CROSS/BLUE SHIELD | Source: Ambulatory Visit | Attending: Radiation Oncology | Admitting: Radiation Oncology

## 2018-07-03 DIAGNOSIS — C61 Malignant neoplasm of prostate: Secondary | ICD-10-CM | POA: Diagnosis not present

## 2018-07-03 DIAGNOSIS — Z510 Encounter for antineoplastic radiation therapy: Secondary | ICD-10-CM | POA: Diagnosis not present

## 2018-07-04 ENCOUNTER — Other Ambulatory Visit: Payer: Self-pay

## 2018-07-04 ENCOUNTER — Ambulatory Visit
Admission: RE | Admit: 2018-07-04 | Discharge: 2018-07-04 | Disposition: A | Discharge status: Home / Self Care | Payer: BLUE CROSS/BLUE SHIELD | Source: Ambulatory Visit | Attending: Radiation Oncology | Admitting: Radiation Oncology

## 2018-07-04 DIAGNOSIS — C61 Malignant neoplasm of prostate: Secondary | ICD-10-CM | POA: Diagnosis not present

## 2018-07-04 DIAGNOSIS — Z510 Encounter for antineoplastic radiation therapy: Secondary | ICD-10-CM | POA: Diagnosis not present

## 2018-07-05 ENCOUNTER — Other Ambulatory Visit: Payer: Self-pay

## 2018-07-05 ENCOUNTER — Ambulatory Visit
Admission: RE | Admit: 2018-07-05 | Discharge: 2018-07-05 | Disposition: A | Discharge status: Home / Self Care | Payer: BLUE CROSS/BLUE SHIELD | Source: Ambulatory Visit | Attending: Radiation Oncology | Admitting: Radiation Oncology

## 2018-07-05 DIAGNOSIS — Z510 Encounter for antineoplastic radiation therapy: Secondary | ICD-10-CM | POA: Diagnosis not present

## 2018-07-05 DIAGNOSIS — C61 Malignant neoplasm of prostate: Secondary | ICD-10-CM | POA: Diagnosis not present

## 2018-07-06 ENCOUNTER — Other Ambulatory Visit: Payer: Self-pay

## 2018-07-06 ENCOUNTER — Ambulatory Visit
Admission: RE | Admit: 2018-07-06 | Discharge: 2018-07-06 | Disposition: A | Discharge status: Home / Self Care | Payer: BLUE CROSS/BLUE SHIELD | Source: Ambulatory Visit | Attending: Radiation Oncology | Admitting: Radiation Oncology

## 2018-07-06 DIAGNOSIS — C61 Malignant neoplasm of prostate: Secondary | ICD-10-CM | POA: Diagnosis not present

## 2018-07-06 DIAGNOSIS — Z510 Encounter for antineoplastic radiation therapy: Secondary | ICD-10-CM | POA: Diagnosis not present

## 2018-07-07 ENCOUNTER — Ambulatory Visit
Admission: RE | Admit: 2018-07-07 | Discharge: 2018-07-07 | Disposition: A | Discharge status: Home / Self Care | Payer: BLUE CROSS/BLUE SHIELD | Source: Ambulatory Visit | Attending: Radiation Oncology | Admitting: Radiation Oncology

## 2018-07-07 ENCOUNTER — Other Ambulatory Visit: Payer: Self-pay

## 2018-07-07 DIAGNOSIS — C61 Malignant neoplasm of prostate: Secondary | ICD-10-CM | POA: Diagnosis not present

## 2018-07-07 DIAGNOSIS — Z510 Encounter for antineoplastic radiation therapy: Secondary | ICD-10-CM | POA: Diagnosis not present

## 2018-07-10 ENCOUNTER — Ambulatory Visit
Admission: RE | Admit: 2018-07-10 | Discharge: 2018-07-10 | Disposition: A | Discharge status: Home / Self Care | Payer: BLUE CROSS/BLUE SHIELD | Source: Ambulatory Visit | Attending: Radiation Oncology | Admitting: Radiation Oncology

## 2018-07-10 ENCOUNTER — Other Ambulatory Visit: Payer: Self-pay

## 2018-07-10 DIAGNOSIS — C61 Malignant neoplasm of prostate: Secondary | ICD-10-CM | POA: Diagnosis not present

## 2018-07-10 DIAGNOSIS — Z510 Encounter for antineoplastic radiation therapy: Secondary | ICD-10-CM | POA: Diagnosis not present

## 2018-07-11 ENCOUNTER — Other Ambulatory Visit: Payer: Self-pay

## 2018-07-11 ENCOUNTER — Ambulatory Visit
Admission: RE | Admit: 2018-07-11 | Discharge: 2018-07-11 | Disposition: A | Discharge status: Home / Self Care | Payer: BLUE CROSS/BLUE SHIELD | Source: Ambulatory Visit | Attending: Radiation Oncology | Admitting: Radiation Oncology

## 2018-07-11 DIAGNOSIS — C61 Malignant neoplasm of prostate: Secondary | ICD-10-CM | POA: Diagnosis not present

## 2018-07-11 DIAGNOSIS — Z510 Encounter for antineoplastic radiation therapy: Secondary | ICD-10-CM | POA: Diagnosis not present

## 2018-07-12 ENCOUNTER — Other Ambulatory Visit: Payer: Self-pay

## 2018-07-12 ENCOUNTER — Ambulatory Visit
Admission: RE | Admit: 2018-07-12 | Discharge: 2018-07-12 | Disposition: A | Discharge status: Home / Self Care | Payer: BLUE CROSS/BLUE SHIELD | Source: Ambulatory Visit | Attending: Radiation Oncology | Admitting: Radiation Oncology

## 2018-07-12 DIAGNOSIS — Z510 Encounter for antineoplastic radiation therapy: Secondary | ICD-10-CM | POA: Diagnosis not present

## 2018-07-12 DIAGNOSIS — C61 Malignant neoplasm of prostate: Secondary | ICD-10-CM | POA: Diagnosis not present

## 2018-07-13 ENCOUNTER — Other Ambulatory Visit: Payer: Self-pay

## 2018-07-13 ENCOUNTER — Ambulatory Visit
Admission: RE | Admit: 2018-07-13 | Discharge: 2018-07-13 | Disposition: A | Discharge status: Home / Self Care | Payer: BLUE CROSS/BLUE SHIELD | Source: Ambulatory Visit | Attending: Radiation Oncology | Admitting: Radiation Oncology

## 2018-07-13 DIAGNOSIS — C61 Malignant neoplasm of prostate: Secondary | ICD-10-CM | POA: Diagnosis not present

## 2018-07-13 DIAGNOSIS — Z510 Encounter for antineoplastic radiation therapy: Secondary | ICD-10-CM | POA: Diagnosis not present

## 2018-07-14 ENCOUNTER — Other Ambulatory Visit: Payer: Self-pay

## 2018-07-14 ENCOUNTER — Ambulatory Visit
Admission: RE | Admit: 2018-07-14 | Discharge: 2018-07-14 | Disposition: A | Discharge status: Home / Self Care | Payer: BLUE CROSS/BLUE SHIELD | Source: Ambulatory Visit | Attending: Radiation Oncology | Admitting: Radiation Oncology

## 2018-07-14 DIAGNOSIS — Z510 Encounter for antineoplastic radiation therapy: Secondary | ICD-10-CM | POA: Diagnosis not present

## 2018-07-14 DIAGNOSIS — C61 Malignant neoplasm of prostate: Secondary | ICD-10-CM | POA: Diagnosis not present

## 2018-07-17 ENCOUNTER — Other Ambulatory Visit: Payer: Self-pay

## 2018-07-17 ENCOUNTER — Ambulatory Visit
Admission: RE | Admit: 2018-07-17 | Discharge: 2018-07-17 | Disposition: A | Discharge status: Home / Self Care | Payer: BLUE CROSS/BLUE SHIELD | Source: Ambulatory Visit | Attending: Radiation Oncology | Admitting: Radiation Oncology

## 2018-07-17 DIAGNOSIS — Z510 Encounter for antineoplastic radiation therapy: Secondary | ICD-10-CM | POA: Diagnosis not present

## 2018-07-17 DIAGNOSIS — C61 Malignant neoplasm of prostate: Secondary | ICD-10-CM | POA: Diagnosis not present

## 2018-07-18 ENCOUNTER — Other Ambulatory Visit: Payer: Self-pay

## 2018-07-18 ENCOUNTER — Ambulatory Visit
Admission: RE | Admit: 2018-07-18 | Discharge: 2018-07-18 | Disposition: A | Discharge status: Home / Self Care | Payer: BLUE CROSS/BLUE SHIELD | Source: Ambulatory Visit | Attending: Radiation Oncology | Admitting: Radiation Oncology

## 2018-07-18 DIAGNOSIS — C61 Malignant neoplasm of prostate: Secondary | ICD-10-CM | POA: Diagnosis not present

## 2018-07-18 DIAGNOSIS — Z510 Encounter for antineoplastic radiation therapy: Secondary | ICD-10-CM | POA: Diagnosis not present

## 2018-07-19 ENCOUNTER — Encounter: Payer: Self-pay | Admitting: Medical Oncology

## 2018-07-19 ENCOUNTER — Ambulatory Visit: Payer: BLUE CROSS/BLUE SHIELD

## 2018-07-19 ENCOUNTER — Other Ambulatory Visit: Payer: Self-pay

## 2018-07-19 ENCOUNTER — Ambulatory Visit
Admission: RE | Admit: 2018-07-19 | Discharge: 2018-07-19 | Disposition: A | Discharge status: Home / Self Care | Payer: BLUE CROSS/BLUE SHIELD | Source: Ambulatory Visit | Attending: Radiation Oncology | Admitting: Radiation Oncology

## 2018-07-19 DIAGNOSIS — C61 Malignant neoplasm of prostate: Secondary | ICD-10-CM | POA: Diagnosis not present

## 2018-07-19 DIAGNOSIS — Z510 Encounter for antineoplastic radiation therapy: Secondary | ICD-10-CM | POA: Diagnosis not present

## 2018-07-20 ENCOUNTER — Ambulatory Visit: Payer: BLUE CROSS/BLUE SHIELD

## 2018-07-21 ENCOUNTER — Ambulatory Visit: Payer: BLUE CROSS/BLUE SHIELD

## 2018-08-02 DIAGNOSIS — Z125 Encounter for screening for malignant neoplasm of prostate: Secondary | ICD-10-CM | POA: Diagnosis not present

## 2018-08-02 DIAGNOSIS — I10 Essential (primary) hypertension: Secondary | ICD-10-CM | POA: Diagnosis not present

## 2018-08-02 DIAGNOSIS — R82998 Other abnormal findings in urine: Secondary | ICD-10-CM | POA: Diagnosis not present

## 2018-08-02 DIAGNOSIS — E781 Pure hyperglyceridemia: Secondary | ICD-10-CM | POA: Diagnosis not present

## 2018-08-02 DIAGNOSIS — E119 Type 2 diabetes mellitus without complications: Secondary | ICD-10-CM | POA: Diagnosis not present

## 2018-08-04 ENCOUNTER — Encounter: Payer: Self-pay | Admitting: Radiation Oncology

## 2018-08-04 NOTE — Progress Notes (Signed)
  Radiation Oncology         (336) [phone] ________________________________  Name: [name] MRN: [id number]  Date: 08/04/2018  DOB: [birth year]  End of Treatment Note  Diagnosis:   62 y.o. gentleman with detectable PSA of 0.47 s/p RALP with BPLND on 05/30/17 for stage T1c adenocarcinoma of the prostate with a Gleason's score of 4+5      Indication for treatment:  Curative, Definitive Radiotherapy       Radiation treatment dates:   05/29/2018 - 07/19/2018  Site/dose:  1. The prostate fossa and pelvic lymph nodes were initially treated to 45 Gy in 25 fractions of 1.8 Gy  2. The prostate fossa only was boosted to 68.4 Gy with 13 additional fractions of 1.8 Gy   Beams/energy:  1. The prostate fossa  and pelvic lymph nodes were initially treated using VMAT intensity modulated radiotherapy delivering 6 megavolt photons. Image guidance was performed with CB-CT studies prior to each fraction. He was immobilized with a body fix lower extremity mold.  2. The prostate fossa only was boosted using VMAT intensity modulated radiotherapy delivering 6 megavolt photons. Image guidance was performed with CB-CT studies prior to each fraction. He was immobilized with a body fix lower extremity mold.  Narrative: The patient tolerated radiation treatment relatively well. He reported a slight increase in nocturia, up from x1-2 to x2-4. He experienced dysuria with some blood spotting, with the dysuria continuing through the end of treatment. He reported mild to no fatigue, diarrhea, urinary urgency, leakage, complete bladder emptying, and moderate stream. He used Imodium for relief of diarrhea.   Of note, he was also undergoing ADT and experienced hot flashes from that but otherwise tolerated this treatment well.  Plan: The patient has completed radiation treatment. He will return to radiation oncology clinic for routine followup in one month. I advised him to call or return sooner if he has any questions or  concerns related to his recovery or treatment. ________________________________  Sheral Apley. Tammi Klippel, M.D.   This document serves as a record of services personally performed by Tyler Pita, MD. It was created on his behalf by Wilburn Mylar, a trained medical scribe. The creation of this record is based on the scribe's personal observations and the provider's statements to them. This document has been checked and approved by the attending provider.

## 2018-08-08 DIAGNOSIS — E119 Type 2 diabetes mellitus without complications: Secondary | ICD-10-CM | POA: Diagnosis not present

## 2018-08-08 DIAGNOSIS — E781 Pure hyperglyceridemia: Secondary | ICD-10-CM | POA: Diagnosis not present

## 2018-08-08 DIAGNOSIS — C61 Malignant neoplasm of prostate: Secondary | ICD-10-CM | POA: Diagnosis not present

## 2018-08-08 DIAGNOSIS — Z0000 Encounter for general adult medical examination without abnormal findings: Secondary | ICD-10-CM | POA: Diagnosis not present

## 2018-08-08 DIAGNOSIS — I10 Essential (primary) hypertension: Secondary | ICD-10-CM | POA: Diagnosis not present

## 2018-08-15 ENCOUNTER — Telehealth: Payer: Self-pay | Admitting: *Deleted

## 2018-08-15 NOTE — Telephone Encounter (Signed)
CALLED PATIENT TO ASK ABOUT [name] TO CALL HIM ON June 3 @ 2 PM, PATIENT AGREED TO THIS

## 2018-08-22 ENCOUNTER — Telehealth: Payer: Self-pay | Admitting: Urology

## 2018-08-22 NOTE — Telephone Encounter (Signed)
Left voicemail to confirm appt and verify info. °

## 2018-08-23 ENCOUNTER — Other Ambulatory Visit: Payer: Self-pay

## 2018-08-23 ENCOUNTER — Ambulatory Visit
Admission: RE | Admit: 2018-08-23 | Discharge: 2018-08-23 | Disposition: A | Discharge status: Home / Self Care | Payer: BC Managed Care – PPO | Source: Ambulatory Visit | Attending: Urology | Admitting: Urology

## 2018-08-23 DIAGNOSIS — C61 Malignant neoplasm of prostate: Secondary | ICD-10-CM

## 2018-08-23 NOTE — Progress Notes (Signed)
Radiation Oncology         (336) [phone] ________________________________  Name: [name] MRN: [id number]  Date: 08/23/2018  DOB: Jan 22, 1956  Post Treatment Note  CC: [name], MD  [name], MD  Diagnosis:   63 y.o. gentleman with detectable PSA of 0.47 s/p RALP with BPLND on 05/30/17 for stage T1c adenocarcinoma of the prostate with a Gleason's score of 4+5   Interval Since Last Radiation:  5 weeks  05/29/2018 - 07/19/2018: 1. The prostate fossa and pelvic lymph nodes were initially treated to 45 Gy in 25 fractions of 1.8 Gy  2. The prostate fossa only was boosted to 68.4 Gy with 13 additional fractions of 1.8 Gy   Narrative:  I spoke with the patient to conduct his routine scheduled 1 month follow up visit via telephone to spare the patient unnecessary potential exposure in the healthcare setting during the current COVID-19 pandemic.  The patient was notified in advance and gave permission to proceed with this visit format. He tolerated radiation treatment relatively well. He reported a slight increase in nocturia, up from x1-2 to x2-4, dysuria with some blood spotting on occasion, with the dysuria continuing through the end of treatment as well as urinary urgency, and scant leakage.  He reported being able to empty his bladder on voiding and maintained a moderate stream, but did experience diarrhea/loose stool which improved with Imodium prn. He was able to continue ADT throughout his radiation treatments and experienced hot flashes from that but otherwise tolerated this treatment well.                              On review of systems, the patient states that he is doing very well overall.  He denies any fatigue and has remained quite active.  He continues with mild increased frequency and urgency but denies UUI or gross hematuria.  He denies dysuria, fever, chills or night sweats.  His bowels remain loose but he denies diarrhea, abdominal pain, N/V or constipation.  He  reports a healthy appetite and is maintaining his weight. He continues with hot flashes and low libido associated with his ADT.  ALLERGIES:  has No Known Allergies.  Meds: Current Outpatient Medications  Medication Sig Dispense Refill   acetaminophen (TYLENOL) 325 MG tablet Take 650 mg by mouth every 6 (six) hours as needed.     ibuprofen (ADVIL,MOTRIN) 200 MG tablet Take 200 mg by mouth every 6 (six) hours as needed.     metFORMIN (GLUCOPHAGE-XR) 500 MG 24 hr tablet Take 500 mg by mouth 3 (three) times daily with meals.     No current facility-administered medications for this encounter.     Physical Findings:  vitals were not taken for this visit.   /Unable to assess due to telephone follow up format.  Lab Findings: Lab Results  Component Value Date   WBC 5.5 05/26/2017   HGB 13.2 05/31/2017   HCT 37.2 (L) 05/31/2017   MCV 87.9 05/26/2017   PLT 129 (L) 05/26/2017     Radiographic Findings: No results found.  Impression/Plan: 1. 63 y.o. gentleman with detectable PSA of 0.47 s/p RALP with BPLND on 05/30/17 for stage T1c adenocarcinoma of the prostate with a Gleason's score of 4+5.   He will continue to follow up with urology for ongoing PSA determinations and is unsure whether he currently has an appointment scheduled with Dr. Alinda Money. He understands what to expect with  regards to PSA monitoring going forward. I will look forward to following his response to treatment via correspondence with urology, and would be happy to continue to participate in his care if clinically indicated. I talked to the patient about what to expect in the future, including his risk for erectile dysfunction and rectal bleeding. I encouraged him to call or return to the office if he has any questions regarding his previous radiation or possible radiation side effects. He was comfortable with this plan and will follow up as needed.    Nicholos Johns, PA-C

## 2018-10-06 DIAGNOSIS — D72829 Elevated white blood cell count, unspecified: Secondary | ICD-10-CM | POA: Diagnosis not present

## 2018-10-06 DIAGNOSIS — D696 Thrombocytopenia, unspecified: Secondary | ICD-10-CM | POA: Diagnosis not present

## 2018-10-06 DIAGNOSIS — E119 Type 2 diabetes mellitus without complications: Secondary | ICD-10-CM | POA: Diagnosis not present

## 2018-10-06 DIAGNOSIS — E781 Pure hyperglyceridemia: Secondary | ICD-10-CM | POA: Diagnosis not present

## 2018-10-06 DIAGNOSIS — C61 Malignant neoplasm of prostate: Secondary | ICD-10-CM | POA: Diagnosis not present

## 2018-10-12 ENCOUNTER — Ambulatory Visit: Payer: Self-pay | Admitting: Cardiology

## 2018-10-19 DIAGNOSIS — G4700 Insomnia, unspecified: Secondary | ICD-10-CM | POA: Diagnosis not present

## 2018-10-27 ENCOUNTER — Encounter: Payer: Self-pay | Admitting: Cardiology

## 2018-10-27 ENCOUNTER — Other Ambulatory Visit: Payer: Self-pay

## 2018-10-27 ENCOUNTER — Ambulatory Visit (INDEPENDENT_AMBULATORY_CARE_PROVIDER_SITE_OTHER): Payer: BC Managed Care – PPO | Admitting: Cardiology

## 2018-10-27 VITALS — BP 157/85 | HR 68 | Ht 69.0 in | Wt 193.0 lb

## 2018-10-27 DIAGNOSIS — I10 Essential (primary) hypertension: Secondary | ICD-10-CM | POA: Insufficient documentation

## 2018-10-27 DIAGNOSIS — R9431 Abnormal electrocardiogram [ECG] [EKG]: Secondary | ICD-10-CM

## 2018-10-27 DIAGNOSIS — R600 Localized edema: Secondary | ICD-10-CM | POA: Diagnosis not present

## 2018-10-27 DIAGNOSIS — E781 Pure hyperglyceridemia: Secondary | ICD-10-CM

## 2018-10-27 HISTORY — DX: Essential (primary) hypertension: I10

## 2018-10-27 MED ORDER — ROSUVASTATIN CALCIUM 20 MG PO TABS: 20.0000 mg | ORAL_TABLET | Freq: Every day | ORAL | 1 refills | Status: DC

## 2018-10-27 MED ORDER — VASCEPA 1 G PO CAPS: 2.0000 g | ORAL_CAPSULE | Freq: Two times a day (BID) | ORAL | 1 refills | Status: DC

## 2018-10-27 NOTE — Patient Instructions (Signed)
High Triglycerides Eating Plan Triglycerides are a type of fat in the blood. High levels of triglycerides can increase your risk of heart disease and stroke. If your triglyceride levels are high, choosing the right foods can help lower your triglycerides and keep your heart healthy. Work with your health care provider or a diet and nutrition specialist (dietitian) to develop an eating plan that is right for you. What are tips for following this plan? General guidelines   Lose weight, if you are overweight. For most people, losing 5-10 lbs (2-5 kg) helps lower triglyceride levels. A weight-loss plan may include. ? 30 minutes of exercise at least 5 days a week. ? Reducing the amount of calories, sugar, and fat you eat.  Eat a wide variety of fresh fruits, vegetables, and whole grains. These foods are high in fiber.  Eat foods that contain healthy fats, such as fatty fish, nuts, seeds, and olive oil.  Avoid foods that are high in added sugar, added salt (sodium), saturated fat, and trans fat.  Avoid low-fiber, refined carbohydrates such as white bread, crackers, noodles, and white rice.  Avoid foods with partially hydrogenated oils (trans fats), such as fried foods or stick margarine.  Limit alcohol intake to no more than 1 drink a day for nonpregnant women and 2 drinks a day for men. One drink equals 12 oz of beer, 5 oz of wine, or 1 oz of hard liquor. Your health care provider may recommend that you drink less depending on your overall health. Reading food labels  Check food labels for the amount of saturated fat. Choose foods with no or very little saturated fat.  Check food labels for the amount of trans fat. Choose foods with no trans fat.  Check food labels for the amount of cholesterol. Choose foods low in cholesterol. Ask your dietitian how much cholesterol you should have each day.  Check food labels for the amount of sodium. Choose foods with less than 140 milligrams (mg) per  serving. Shopping  Buy dairy products labeled as nonfat (skim) or low-fat (1%).  Avoid buying processed or prepackaged foods. These are often high in added sugar, sodium, and fat. Cooking  Choose healthy fats when cooking, such as olive oil or canola oil.  Cook foods using lower fat methods, such as baking, broiling, boiling, or grilling.  Make your own sauces, dressings, and marinades when possible, instead of buying them. Store-bought sauces, dressings, and marinades are often high in sodium and sugar. Meal planning  Eat more home-cooked food and less restaurant, buffet, and fast food.  Eat fatty fish at least 2 times each week. Examples of fatty fish include salmon, trout, mackerel, tuna, and herring.  If you eat whole eggs, do not eat more than 3 egg yolks per week. What foods are recommended? The items listed may not be a complete list. Talk with your dietitian about what dietary choices are best for you. Grains Whole wheat or whole grain breads, crackers, cereals, and pasta. Unsweetened oatmeal. Bulgur. Barley. Quinoa. Brown rice. Whole wheat flour tortillas. Vegetables Fresh or frozen vegetables. Low-sodium canned vegetables. Fruits All fresh, canned (in natural juice), or frozen fruits. Meats and other protein foods Skinless chicken or turkey. Ground chicken or turkey. Lean cuts of pork, trimmed of fat. Fish and seafood, especially salmon, trout, and herring. Egg whites. Dried beans, peas, or lentils. Unsalted nuts or seeds. Unsalted canned beans. Natural peanut or almond butter. Dairy Low-fat dairy products. Skim or low-fat (1%) milk. Reduced fat (  2%) and low-sodium cheese. Low-fat ricotta cheese. Low-fat cottage cheese. Plain, low-fat yogurt. Fats and oils Tub margarine without trans fats. Light or reduced-fat mayonnaise. Light or reduced-fat salad dressings. Avocado. Safflower, olive, sunflower, soybean, and canola oils. What foods are not recommended? The items listed  may not be a complete list. Talk with your dietitian about what dietary choices are best for you. Grains White bread. White (regular) pasta. White rice. Cornbread. Bagels. Pastries. Crackers that contain trans fat. Vegetables Creamed or fried vegetables. Vegetables in a cheese sauce. Fruits Sweetened dried fruit. Canned fruit in syrup. Fruit juice. Meats and other protein foods Fatty cuts of meat. Ribs. Chicken wings. Bacon. Sausage. Bologna. Salami. Chitterlings. Fatback. Hot dogs. Bratwurst. Packaged lunch meats. Dairy Whole or reduced-fat (2%) milk. Half-and-half. Cream cheese. Full-fat or sweetened yogurt. Full-fat cheese. Nondairy creamers. Whipped toppings. Processed cheese or cheese spreads. Cheese curds. Beverages Alcohol. Sweetened drinks, such as soda, lemonade, fruit drinks, or punches. Fats and oils Butter. Stick margarine. Lard. Shortening. Ghee. Bacon fat. Tropical oils, such as coconut, palm kernel, or palm oils. Sweets and desserts Corn syrup. Sugars. Honey. Molasses. Candy. Jam and jelly. Syrup. Sweetened cereals. Cookies. Pies. Cakes. Donuts. Muffins. Ice cream. Condiments Store-bought sauces, dressings, and marinades that are high in sugar, such as ketchup and barbecue sauce. Summary  High levels of triglycerides can increase the risk of heart disease and stroke. Choosing the right foods can help lower your triglycerides.  Eat plenty of fresh fruits, vegetables, and whole grains. Choose low-fat dairy and lean meats. Eat fatty fish at least twice a week.  Avoid processed and prepackaged foods with added sugar, sodium, saturated fat, and trans fat.  If you need suggestions or have questions about what types of food are good for you, talk with your health care provider or a dietitian. This information is not intended to replace advice given to you by your health care provider. Make sure you discuss any questions you have with your health care provider. Document Released:  12/25/2003 Document Revised: 02/18/2017 Document Reviewed: 05/11/2016 Elsevier Patient Education  2020 [employer]  

## 2018-10-27 NOTE — Progress Notes (Signed)
Primary Physician:  Mark Solian, MD   Patient ID: [name], male    DOB: January 07, 1956, 63 y.o.   MRN: [id number]  Subjective:    Chief Complaint  Patient presents with  . Hypertension  . Shortness of Breath  . Diabetes  . New Patient (Initial Visit)    HPI: [name]  is a 63 y.o. male  with type 2 diabetes, Prostate CA, OA, referred to [place] for evaluation of hypertriglycerdemia.   TG levels have increased from 500 to 1000 recently. Diabetes is uncontrolled. He reports being diagnosed 1 year ago and states that his diet could be better. He has been started on fenofibrate by PCP.  He does have some mild shortness of breath that is new for him. Notices walking short distances. No chest discomfort. No PND or orthopnea. No leg swelling.   He is on Luporn for Prostate Cancer. No history of tobacco use. Denies any family history of heart disease.  Past Medical History:  Diagnosis Date  . Arthritis   . Diabetes mellitus without complication ([place]) 8466   Diabetes Type II  . HTN (hypertension) 10/27/2018  . Hyperlipidemia   . Internal hemorrhoids 2014   noted on colonoscopy  . Osteoarthritis of both knees   . Pre-diabetes    questionable  . Prostate cancer ([place])   . Sigmoid diverticulosis 2014   Mild, noted on colonoscopy    Past Surgical History:  Procedure Laterality Date  . COLONOSCOPY N/A 09/13/2012   Procedure: COLONOSCOPY;  Surgeon: Mark Binder, MD;  [facility];  Service: Endoscopy;  Laterality: N/A;  9:30 AM  . HERNIA REPAIR     20 years ago  . KNEE ARTHROSCOPY Left    15 years ago  . LYMPHADENECTOMY Bilateral 05/30/2017   Procedure: LYMPHADENECTOMY, PELVIC EXTENDED;  Surgeon: Mark Bring, MD;  Location: [facility];  Service: Urology;  Laterality: Bilateral;  . ROBOT ASSISTED LAPAROSCOPIC RADICAL PROSTATECTOMY N/A 05/30/2017   Procedure: XI ROBOTIC ASSISTED LAPAROSCOPIC RADICAL PROSTATECTOMY LEVEL 3;  Surgeon: Mark Bring, MD;  Location:  [facility];  Service: Urology;  Laterality: N/A;  . SHOULDER ARTHROSCOPY Right    15 years ago    Social History   Socioeconomic History  . Marital status: Married    Spouse name: Not on file  . Number of children: 1  . Years of education: Not on file  . Highest education level: Not on file  Occupational History  . Occupation: Theme park manager  Social Needs  . Financial resource strain: Not on file  . Food insecurity    Worry: Not on file    Inability: Not on file  . Transportation needs    Medical: Not on file    Non-medical: Not on file  Tobacco Use  . Smoking status: Never Smoker  . Smokeless tobacco: Never Used  Substance and Sexual Activity  . Alcohol use: No  . Drug use: No  . Sexual activity: Yes  Lifestyle  . Physical activity    Days per week: Not on file    Minutes per session: Not on file  . Stress: Not on file  Relationships  . Social Herbalist on phone: Not on file    Gets together: Not on file    Attends religious service: Not on file    Active member of club or organization: Not on file    Attends meetings of clubs or organizations: Not on file    Relationship status:  Not on file  . Intimate partner violence    Fear of current or ex partner: Not on file    Emotionally abused: Not on file    Physically abused: Not on file    Forced sexual activity: Not on file  Other Topics Concern  . Not on file  Social History Narrative   [name]   Married to [employer]   Daughter, Mark Gigi Gin, RN III, Resides in [place]    Review of Systems  Constitution: Negative for decreased appetite, malaise/fatigue, weight gain and weight loss.  Eyes: Negative for visual disturbance.  Cardiovascular: Positive for dyspnea on exertion. Negative for chest pain, claudication, leg swelling, orthopnea, palpitations and syncope.  Respiratory: Negative for hemoptysis and wheezing.   Endocrine: Negative for cold intolerance and heat intolerance.   Hematologic/Lymphatic: Does not bruise/bleed easily.  Skin: Negative for nail changes.  Musculoskeletal: Negative for muscle weakness and myalgias.  Gastrointestinal: Negative for abdominal pain, change in bowel habit, nausea and vomiting.  Neurological: Negative for difficulty with concentration, dizziness, focal weakness and headaches.  Psychiatric/Behavioral: Negative for altered mental status and suicidal ideas.  All other systems reviewed and are negative.     Objective:  Blood pressure (!) 157/85, pulse 68, height 5\' 9"  (1.753 m), weight 193 lb (87.5 kg), SpO2 99 %. Body mass index is 28.5 kg/m.    Physical Exam  Constitutional: He is oriented to person, place, and time. Vital signs are normal. He appears well-developed and well-nourished.  HENT:  Head: Normocephalic and atraumatic.  Neck: Normal range of motion.  Cardiovascular: Normal rate, regular rhythm, normal heart sounds and intact distal pulses.  1+ pitting edema bilateral  Pulmonary/Chest: Effort normal and breath sounds normal. No accessory muscle usage. No respiratory distress.  Abdominal: Soft. Bowel sounds are normal.  Musculoskeletal: Normal range of motion.  Neurological: He is alert and oriented to person, place, and time.  Skin: Skin is warm and dry.  Vitals reviewed.  Radiology: No results found.  Laboratory examination:   PCP labs 10/06/2018: Normal H&H, WBC low at 3.57, platelet count low at 124 cholesterol 189, triglycerides 1000, HDL 26, LDL unable to be calculated. Non- HDL 163.  CMP Latest Ref Rng & Units 05/26/2017  Glucose 65 - 99 mg/dL 258(H)  BUN 6 - 20 mg/dL 14  Creatinine 0.61 - 1.24 mg/dL 1.07  Sodium 135 - 145 mmol/L 141  Potassium 3.5 - 5.1 mmol/L 4.9  Chloride 101 - 111 mmol/L 105  CO2 22 - 32 mmol/L 28  Calcium 8.9 - 10.3 mg/dL 9.1   CBC Latest Ref Rng & Units 05/31/2017 05/30/2017 05/26/2017  WBC 4.0 - 10.5 K/uL - - 5.5  Hemoglobin 13.0 - 17.0 g/dL 13.2 15.0 16.4  Hematocrit 39.0 -  52.0 % 37.2(L) 41.0 45.9  Platelets 150 - 400 K/uL - - 129(L)   Lipid Panel  No results found for: CHOL, TRIG, HDL, CHOLHDL, VLDL, LDLCALC, LDLDIRECT HEMOGLOBIN A1C Lab Results  Component Value Date   HGBA1C 7.1 (H) 05/26/2017   MPG 157.07 05/26/2017   TSH No results for input(s): TSH in the last 8760 hours.  PRN Meds:. There are no discontinued medications. Current Meds  Medication Sig  . acetaminophen (TYLENOL) 325 MG tablet Take 650 mg by mouth every 6 (six) hours as needed.  Marland Kitchen ibuprofen (ADVIL,MOTRIN) 200 MG tablet Take 200 mg by mouth every 6 (six) hours as needed.  Marland Kitchen leuprolide (LUPRON) 3.75 MG injection Inject into the muscle once. Unsure of dose  .  metFORMIN (GLUCOPHAGE-XR) 500 MG 24 hr tablet Take 500 mg by mouth 3 (three) times daily with meals.    Cardiac Studies:     Assessment:     ICD-10-CM   1. Hypertriglyceridemia  E78.1 Direct LDL  2. Essential hypertension  I10 EKG 12-Lead  3. Abnormal EKG  R94.31 PCV ECHOCARDIOGRAM COMPLETE    PCV MYOCARDIAL PERFUSION WITH LEXISCAN  4. Bilateral leg edema  R60.0 PCV ECHOCARDIOGRAM COMPLETE    EKG 10/27/2018: Normal sinus rhythm at 65 bpm, left atrial enlargement, left axis deviation, IRBBB, diffuse non-specific T wave abnormality.   Recommendations:   Patient with prostate CA, uncontrolled type 2 diabetes, referred to [place] by Dr. Dagmar Hait for evaluation of hypertriglyceridemia.  I suspect that patients uncontrolled diabetes and poor dietary compliance is likely contributing to his elevated triglycerides. He has been counseled on the importance of controlling his diabetes with dietary changes. I have encouraged him to work towards 10-15 lb weight loss. [facility] diet sheet was provided. I will start him on Vascepa 2 g BID. LDL was unable to be calculated, will obtain direct LDL. In view of his risk factors, feel that he should also be on statin therapy. Will start him on Crestor 20 mg daily.   Blood pressure is elevated today, but  states he is without history of hypertension. Will reevaluate at his next office visit and if continues to be elevated, will start therapy. He does have abnormal EKG and newly noted dyspnea on exertion. Will obtain lexiscan nuclear stress testing for further evaluation. Unable to perform treadmill testing due to dyspnea. I will also obtain echocardiogram to exclude structural abnormalities. He will need repeat lipids in 8 weeks, will order at his follow up visit. I will see him back after the test for follow up.    *I have discussed this case with Dr. Einar Gip and he personally examined the patient and participated in formulating the plan.*   Miquel Dunn, MSN, APRN, FNP-C [facility]. [place] Office: [phone] Fax: [phone]

## 2018-10-30 DIAGNOSIS — E781 Pure hyperglyceridemia: Secondary | ICD-10-CM | POA: Diagnosis not present

## 2018-10-31 LAB — LDL CHOLESTEROL, DIRECT: LDL Direct: 113 mg/dL — ABNORMAL HIGH (ref 0–99)

## 2018-11-01 ENCOUNTER — Encounter: Payer: Self-pay | Admitting: Cardiology

## 2018-11-03 ENCOUNTER — Telehealth: Payer: Self-pay

## 2018-11-03 NOTE — Telephone Encounter (Signed)
Lvm for pt to start meds

## 2018-11-03 NOTE — Telephone Encounter (Signed)
Patients wife called and asked about blood work and if he should start the medline?

## 2018-11-06 ENCOUNTER — Other Ambulatory Visit: Payer: Self-pay

## 2018-11-06 MED ORDER — ROSUVASTATIN CALCIUM 20 MG PO TABS: 20.0000 mg | ORAL_TABLET | Freq: Every day | ORAL | 1 refills | Status: DC

## 2018-11-08 ENCOUNTER — Other Ambulatory Visit: Payer: BC Managed Care – PPO

## 2018-11-13 ENCOUNTER — Other Ambulatory Visit: Payer: BC Managed Care – PPO

## 2018-11-17 DIAGNOSIS — C61 Malignant neoplasm of prostate: Secondary | ICD-10-CM | POA: Diagnosis not present

## 2018-11-22 ENCOUNTER — Other Ambulatory Visit: Payer: Self-pay

## 2018-11-22 ENCOUNTER — Ambulatory Visit (INDEPENDENT_AMBULATORY_CARE_PROVIDER_SITE_OTHER): Payer: BC Managed Care – PPO | Admitting: Orthopedic Surgery

## 2018-11-22 VITALS — BP 131/70 | HR 67 | Ht 69.0 in | Wt 187.0 lb

## 2018-11-22 DIAGNOSIS — G5601 Carpal tunnel syndrome, right upper limb: Secondary | ICD-10-CM | POA: Diagnosis not present

## 2018-11-22 DIAGNOSIS — R202 Paresthesia of skin: Secondary | ICD-10-CM

## 2018-11-22 NOTE — Patient Instructions (Signed)
Carpal Tunnel Syndrome  Carpal tunnel syndrome is a condition that causes pain in your hand and arm. The carpal tunnel is a narrow area located on the palm side of your wrist. Repeated wrist motion or certain diseases may cause swelling within the tunnel. This swelling pinches the main nerve in the wrist (median nerve). What are the causes? This condition may be caused by:  Repeated wrist motions.  Wrist injuries.  Arthritis.  A cyst or tumor in the carpal tunnel.  Fluid buildup during pregnancy. Sometimes the cause of this condition is not known. What increases the risk? The following factors may make you more likely to develop this condition:  Having a job, such as being a butcher or a cashier, that requires you to repeatedly move your wrist in the same motion.  Being a woman.  Having certain conditions, such as: ? Diabetes. ? Obesity. ? An underactive thyroid (hypothyroidism). ? Kidney failure. What are the signs or symptoms? Symptoms of this condition include:  A tingling feeling in your fingers, especially in your thumb, index, and middle fingers.  Tingling or numbness in your hand.  An aching feeling in your entire arm, especially when your wrist and elbow are bent for a long time.  Wrist pain that goes up your arm to your shoulder.  Pain that goes down into your palm or fingers.  A weak feeling in your hands. You may have trouble grabbing and holding items. Your symptoms may feel worse during the night. How is this diagnosed? This condition is diagnosed with a medical history and physical exam. You may also have tests, including:  Electromyogram (EMG). This test measures electrical signals sent by your nerves into the muscles.  Nerve conduction study. This test measures how well electrical signals pass through your nerves.  Imaging tests, such as X-rays, ultrasound, and MRI. These tests check for possible causes of your condition. How is this treated? This  condition may be treated with:  Lifestyle changes. It is important to stop or change the activity that caused your condition.  Doing exercise and activities to strengthen your muscles and bones (physical therapy).  Learning how to use your hand again after diagnosis (occupational therapy).  Medicines for pain and inflammation. This may include medicine that is injected into your wrist.  A wrist splint.  Surgery. Follow these instructions at home: If you have a splint:  Wear the splint as told by your health care provider. Remove it only as told by your health care provider.  Loosen the splint if your fingers tingle, become numb, or turn cold and blue.  Keep the splint clean.  If the splint is not waterproof: ? Do not let it get wet. ? Cover it with a watertight covering when you take a bath or shower. Managing pain, stiffness, and swelling   If directed, put ice on the painful area: ? If you have a removable splint, remove it as told by your health care provider. ? Put ice in a plastic bag. ? Place a towel between your skin and the bag. ? Leave the ice on for 20 minutes, 2-3 times per day. General instructions  Take over-the-counter and prescription medicines only as told by your health care provider.  Rest your wrist from any activity that may be causing your pain. If your condition is work related, talk with your employer about changes that can be made, such as getting a wrist pad to use while typing.  Do any exercises as told   by your health care provider, physical therapist, or occupational therapist.  Keep all follow-up visits as told by your health care provider. This is important. Contact a health care provider if:  You have new symptoms.  Your pain is not controlled with medicines.  Your symptoms get worse. Get help right away if:  You have severe numbness or tingling in your wrist or hand. Summary  Carpal tunnel syndrome is a condition that causes pain in  your hand and arm.  It is usually caused by repeated wrist motions.  Lifestyle changes and medicines are used to treat carpal tunnel syndrome. Surgery may be recommended.  Follow your health care provider's instructions about wearing a splint, resting from activity, keeping follow-up visits, and calling for help. This information is not intended to replace advice given to you by your health care provider. Make sure you discuss any questions you have with your health care provider. Document Released: 03/05/2000 Document Revised: 07/15/2017 Document Reviewed: 07/15/2017 Elsevier Patient Education  2020 [employer]vier Inc[employer]  

## 2018-11-22 NOTE — Progress Notes (Signed)
[name]  11/22/2018  HISTORY SECTION :  Chief Complaint  Patient presents with  . Hand Pain    Right hand CTS.   HPI The patient presents for evaluation of pain and paresthesias in the right upper extremity.  He is a 63 year old male diabetic undergoing treatment for prostate cancer presents with 2 to 3-year history of pain paresthesias nocturnal symptoms waking up at night right upper extremity with numbness tingling right arm from the elbow down but mainly right hand.  He did wear brace for 2 years he reports no weakness   Review of Systems  All other systems reviewed and are negative.  Cancer treatments at [facility] on Lupron   Past Medical History:  Diagnosis Date  . Arthritis   . Diabetes mellitus without complication ([place]) [id number]   Diabetes Type II  . HTN (hypertension) 10/27/2018  . Hyperlipidemia   . Internal hemorrhoids 2014   noted on colonoscopy  . Osteoarthritis of both knees   . Pre-diabetes    questionable  . Prostate cancer ([place])   . Sigmoid diverticulosis 2014   Mild, noted on colonoscopy    Past Surgical History:  Procedure Laterality Date  . COLONOSCOPY N/A 09/13/2012   Procedure: COLONOSCOPY;  Surgeon: Danie Binder, MD;  [facility];  Service: Endoscopy;  Laterality: N/A;  9:30 AM  . HERNIA REPAIR     20 years ago  . KNEE ARTHROSCOPY Left    15 years ago  . LYMPHADENECTOMY Bilateral 05/30/2017   Procedure: LYMPHADENECTOMY, PELVIC EXTENDED;  Surgeon: [name], MD;  Location[facility];  Service: Urology;  Laterality: Bilateral;  . ROBOT ASSISTED LAPAROSCOPIC RADICAL PROSTATECTOMY N/A 05/30/2017   Procedure: XI ROBOTIC ASSISTED LAPAROSCOPIC RADICAL PROSTATECTOMY LEVEL 3;  Surgeon: [name], MD;  Location: [facility];  Service: Urology;  Laterality: N/A;  . SHOULDER ARTHROSCOPY Right    15 years ago     No Known Allergies   Current Outpatient Medications:  .  escitalopram (LEXAPRO) 5 MG tablet, Take 5 mg by  mouth daily., Disp: , Rfl:  .  fenofibrate 160 MG tablet, Take 160 mg by mouth daily., Disp: , Rfl:  .  glipiZIDE (GLUCOTROL XL) 2.5 MG 24 hr tablet, Take 1 mg by mouth daily with breakfast., Disp: , Rfl:  .  rosuvastatin (CRESTOR) 20 MG tablet, Take 1 tablet (20 mg total) by mouth daily., Disp: 90 tablet, Rfl: 1 .  temazepam (RESTORIL) 15 MG capsule, Take 15 mg by mouth at bedtime as needed for sleep., Disp: , Rfl:  .  acetaminophen (TYLENOL) 325 MG tablet, Take 650 mg by mouth every 6 (six) hours as needed., Disp: , Rfl:  .  ibuprofen (ADVIL,MOTRIN) 200 MG tablet, Take 200 mg by mouth every 6 (six) hours as needed., Disp: , Rfl:  .  Icosapent Ethyl (VASCEPA) 1 g CAPS, Take 2 capsules (2 g total) by mouth 2 (two) times daily. (Patient not taking: Reported on 11/22/2018), Disp: 60 capsule, Rfl: 1 .  leuprolide (LUPRON) 3.75 MG injection, Inject into the muscle once. Unsure of dose, Disp: , Rfl:  .  metFORMIN (GLUCOPHAGE-XR) 500 MG 24 hr tablet, Take 500 mg by mouth 3 (three) times daily with meals., Disp: , Rfl:    PHYSICAL EXAM SECTION: 1) BP 131/70   Pulse 67   Ht 5\' 9"  (1.753 m)   Wt 187 lb (84.8 kg)   BMI 27.62 kg/m   Body mass index is 27.62 kg/m. General appearance:  Well-developed well-nourished no gross deformities  2) Cardiovascular normal pulse and perfusion in the upper extremities normal color without edema  3) Neurologically deep tendon reflexes are equal and normal, no sensation loss or deficits no pathologic reflexes  4) Psychological: Awake alert and oriented x3 mood and affect normal  5) Skin no lacerations or ulcerations no nodularity no palpable masses, no erythema or nodularity  6) Musculoskeletal:   Left hand alignment is normal range of motion is normal there is no instability and grip strength is normal  Right hand alignment is normal range of motion is normal there is no instability grip strength is normal   MEDICAL DECISION SECTION:  Encounter Diagnosis   Name Primary?  . Carpal tunnel syndrome, right upper limb Yes    Imaging None  Plan:  (Rx., Inj., surg., Frx, MRI/CT, XR:2)  Nerve conduction study  Follow-up for result  9:19 AM Arther Abbott, MD  11/22/2018

## 2018-11-24 DIAGNOSIS — C775 Secondary and unspecified malignant neoplasm of intrapelvic lymph nodes: Secondary | ICD-10-CM | POA: Diagnosis not present

## 2018-11-24 DIAGNOSIS — D72819 Decreased white blood cell count, unspecified: Secondary | ICD-10-CM | POA: Diagnosis not present

## 2018-11-24 DIAGNOSIS — E781 Pure hyperglyceridemia: Secondary | ICD-10-CM | POA: Diagnosis not present

## 2018-11-24 DIAGNOSIS — C61 Malignant neoplasm of prostate: Secondary | ICD-10-CM | POA: Diagnosis not present

## 2018-11-24 DIAGNOSIS — Z23 Encounter for immunization: Secondary | ICD-10-CM | POA: Diagnosis not present

## 2018-11-24 DIAGNOSIS — E119 Type 2 diabetes mellitus without complications: Secondary | ICD-10-CM | POA: Diagnosis not present

## 2018-11-28 ENCOUNTER — Ambulatory Visit: Payer: BC Managed Care – PPO | Admitting: Cardiology

## 2018-12-04 ENCOUNTER — Other Ambulatory Visit: Payer: Self-pay

## 2018-12-04 ENCOUNTER — Ambulatory Visit (INDEPENDENT_AMBULATORY_CARE_PROVIDER_SITE_OTHER): Payer: BC Managed Care – PPO

## 2018-12-04 DIAGNOSIS — R9431 Abnormal electrocardiogram [ECG] [EKG]: Secondary | ICD-10-CM | POA: Diagnosis not present

## 2018-12-07 ENCOUNTER — Ambulatory Visit (INDEPENDENT_AMBULATORY_CARE_PROVIDER_SITE_OTHER): Payer: BC Managed Care – PPO

## 2018-12-07 ENCOUNTER — Other Ambulatory Visit: Payer: Self-pay

## 2018-12-07 DIAGNOSIS — R9431 Abnormal electrocardiogram [ECG] [EKG]: Secondary | ICD-10-CM

## 2018-12-07 DIAGNOSIS — R600 Localized edema: Secondary | ICD-10-CM | POA: Diagnosis not present

## 2018-12-11 ENCOUNTER — Other Ambulatory Visit: Payer: Self-pay

## 2018-12-11 ENCOUNTER — Encounter: Payer: Self-pay | Admitting: Cardiology

## 2018-12-11 ENCOUNTER — Ambulatory Visit (INDEPENDENT_AMBULATORY_CARE_PROVIDER_SITE_OTHER): Payer: BC Managed Care – PPO | Admitting: Cardiology

## 2018-12-11 VITALS — BP 140/71 | HR 71 | Ht 69.0 in | Wt 193.8 lb

## 2018-12-11 DIAGNOSIS — E781 Pure hyperglyceridemia: Secondary | ICD-10-CM | POA: Diagnosis not present

## 2018-12-11 DIAGNOSIS — R9431 Abnormal electrocardiogram [ECG] [EKG]: Secondary | ICD-10-CM | POA: Diagnosis not present

## 2018-12-11 DIAGNOSIS — I10 Essential (primary) hypertension: Secondary | ICD-10-CM

## 2018-12-11 MED ORDER — FENOFIBRATE 160 MG PO TABS: 160.0000 mg | ORAL_TABLET | Freq: Every day | ORAL | 1 refills | Status: DC

## 2018-12-11 MED ORDER — LOSARTAN POTASSIUM-HCTZ 50-12.5 MG PO TABS: 1.0000 | ORAL_TABLET | Freq: Every day | ORAL | 1 refills | Status: DC

## 2018-12-11 MED ORDER — VASCEPA 1 G PO CAPS: 2.0000 g | ORAL_CAPSULE | Freq: Two times a day (BID) | ORAL | 1 refills | Status: DC

## 2018-12-11 NOTE — Progress Notes (Signed)
Primary Physician:  [name], MD   Patient ID: [name], male    DOB: 14-Jul-1955, 63 y.o.   MRN: [id number]  Subjective:    Chief Complaint  Patient presents with  . Shortness of Breath    f/u tests    HPI: [name]  is a 63 y.o. male  with type 2 diabetes, Prostate CA, OA, recently evaluated for hypertriglyceridemia and newly noted dyspnea on exertion.    At his last office visit, he was started on Vascepa and Crestor. It appears that he did not start the Vascepa. He is on fenofibrate given by PCP. He reports recent triglyceride levels have improved from 1000 to 250. He has been making significant diet changes.   He does have some mild shortness of breath that is new for him. Notices walking short distances. No chest discomfort. No PND or orthopnea. No leg swelling. He underwent echocardiogram and lexiscan nuclear stress test and now presents for follow up.   He is on Luporn for Prostate Cancer. No history of tobacco use. Denies any family history of heart disease.  Past Medical History:  Diagnosis Date  . Arthritis   . Diabetes mellitus without complication ([place]) [id number]   Diabetes Type II  . HTN (hypertension) 10/27/2018  . Hyperlipidemia   . Internal hemorrhoids 2014   noted on colonoscopy  . Osteoarthritis of both knees   . Pre-diabetes    questionable  . Prostate cancer ([place])   . Sigmoid diverticulosis 2014   Mild, noted on colonoscopy    Past Surgical History:  Procedure Laterality Date  . COLONOSCOPY N/A 09/13/2012   Procedure: COLONOSCOPY;  Surgeon: Danie Binder, MD;  [facility];  Service: Endoscopy;  Laterality: N/A;  9:30 AM  . HERNIA REPAIR     20 years ago  . KNEE ARTHROSCOPY Left    15 years ago  . LYMPHADENECTOMY Bilateral 05/30/2017   Procedure: LYMPHADENECTOMY, PELVIC EXTENDED;  Surgeon: [name], MD;  Location: [facility];  Service: Urology;  Laterality: Bilateral;  . ROBOT ASSISTED LAPAROSCOPIC RADICAL  PROSTATECTOMY N/A 05/30/2017   Procedure: XI ROBOTIC ASSISTED LAPAROSCOPIC RADICAL PROSTATECTOMY LEVEL 3;  Surgeon: [name], MD;  Location: [facility];  Service: Urology;  Laterality: N/A;  . SHOULDER ARTHROSCOPY Right    15 years ago    Social History   Socioeconomic History  . Marital status: Married    Spouse name: Not on file  . Number of children: 1  . Years of education: Not on file  . Highest education level: Not on file  Occupational History  . Occupation: Theme park manager  Social Needs  . Financial resource strain: Not on file  . Food insecurity    Worry: Not on file    Inability: Not on file  . Transportation needs    Medical: Not on file    Non-medical: Not on file  Tobacco Use  . Smoking status: Never Smoker  . Smokeless tobacco: Never Used  Substance and Sexual Activity  . Alcohol use: No  . Drug use: No  . Sexual activity: Yes  Lifestyle  . Physical activity    Days per week: Not on file    Minutes per session: Not on file  . Stress: Not on file  Relationships  . Social Herbalist on phone: Not on file    Gets together: Not on file    Attends religious service: Not on file    Active member of  club or organization: Not on file    Attends meetings of clubs or organizations: Not on file    Relationship status: Not on file  . Intimate partner violence    Fear of current or ex partner: Not on file    Emotionally abused: Not on file    Physically abused: Not on file    Forced sexual activity: Not on file  Other Topics Concern  . Not on file  Social History Narrative   Doristine Bosworth   Married to [employer]   Daughter, Abby Gigi Gin, RN III, Resides in [place]    Review of Systems  Constitution: Negative for decreased appetite, malaise/fatigue, weight gain and weight loss.  Eyes: Negative for visual disturbance.  Cardiovascular: Positive for dyspnea on exertion. Negative for chest pain, claudication, leg swelling, orthopnea, palpitations and  syncope.  Respiratory: Negative for hemoptysis and wheezing.   Endocrine: Negative for cold intolerance and heat intolerance.  Hematologic/Lymphatic: Does not bruise/bleed easily.  Skin: Negative for nail changes.  Musculoskeletal: Negative for muscle weakness and myalgias.  Gastrointestinal: Negative for abdominal pain, change in bowel habit, nausea and vomiting.  Neurological: Negative for difficulty with concentration, dizziness, focal weakness and headaches.  Psychiatric/Behavioral: Negative for altered mental status and suicidal ideas.  All other systems reviewed and are negative.     Objective:  Blood pressure 140/71, pulse 71, height 5\' 9"  (1.753 m), weight 193 lb 12.8 oz (87.9 kg), SpO2 97 %. Body mass index is 28.62 kg/m.    Physical Exam  Constitutional: He is oriented to person, place, and time. Vital signs are normal. He appears well-developed and well-nourished.  HENT:  Head: Normocephalic and atraumatic.  Neck: Normal range of motion.  Cardiovascular: Normal rate, regular rhythm, normal heart sounds and intact distal pulses.  Trace edema bilaterally  Pulmonary/Chest: Effort normal and breath sounds normal. No accessory muscle usage. No respiratory distress.  Abdominal: Soft. Bowel sounds are normal.  Musculoskeletal: Normal range of motion.  Neurological: He is alert and oriented to person, place, and time.  Skin: Skin is warm and dry.  Vitals reviewed.  Radiology: No results found.  Laboratory examination:   PCP labs 10/06/2018: Normal H&H, WBC low at 3.57, platelet count low at 124 cholesterol 189, triglycerides 1000, HDL 26, LDL unable to be calculated. Non- HDL 163.  CMP Latest Ref Rng & Units 05/26/2017  Glucose 65 - 99 mg/dL 258(H)  BUN 6 - 20 mg/dL 14  Creatinine 0.61 - 1.24 mg/dL 1.07  Sodium 135 - 145 mmol/L 141  Potassium 3.5 - 5.1 mmol/L 4.9  Chloride 101 - 111 mmol/L 105  CO2 22 - 32 mmol/L 28  Calcium 8.9 - 10.3 mg/dL 9.1   CBC Latest Ref Rng &  Units 05/31/2017 05/30/2017 05/26/2017  WBC 4.0 - 10.5 K/uL - - 5.5  Hemoglobin 13.0 - 17.0 g/dL 13.2 15.0 16.4  Hematocrit 39.0 - 52.0 % 37.2(L) 41.0 45.9  Platelets 150 - 400 K/uL - - 129(L)   Lipid Panel     Component Value Date/Time   LDLDIRECT 113 (H) 10/30/2018 0831   HEMOGLOBIN A1C Lab Results  Component Value Date   HGBA1C 7.1 (H) 05/26/2017   MPG 157.07 05/26/2017   TSH No results for input(s): TSH in the last 8760 hours.  PRN Meds:. Medications Discontinued During This Encounter  Medication Reason  . fenofibrate 160 MG tablet Discontinued by provider  . Icosapent Ethyl (VASCEPA) 1 g CAPS Reorder   Current Meds  Medication Sig  .  escitalopram (LEXAPRO) 5 MG tablet Take 5 mg by mouth daily.  . fenofibrate 160 MG tablet Take 1 tablet (160 mg total) by mouth daily.  Marland Kitchen glipiZIDE (GLUCOTROL XL) 2.5 MG 24 hr tablet Take 1 mg by mouth daily with breakfast.  . naproxen sodium (ALEVE) 220 MG tablet Take 220 mg by mouth.  . rosuvastatin (CRESTOR) 20 MG tablet Take 1 tablet (20 mg total) by mouth daily.  . temazepam (RESTORIL) 15 MG capsule Take 15 mg by mouth at bedtime as needed for sleep.  . [DISCONTINUED] fenofibrate 160 MG tablet Take 160 mg by mouth daily.    Cardiac Studies:   Echocardiogram 12/07/2018: Left ventricle cavity is normal in size. Normal left ventricular wall thickness. Normal LV systolic function with EF 55%. Normal global wall motion. Normal diastolic filling pattern. Calculated EF 55%. Mild (Grade I) mitral regurgitation. No evidence of pulmonary hypertension.  Exercise myoview stress test  12/04/2018: 1. Patient exercised on the Bruce protocol. They exercised for a total of 8:34 min., achieving approximately 7 METs. Resting EKG demonstrates normal sinus rhythm, stress EKG is negative for myocardial ischemia. Normal blood pressure response.  Stress terminated due to fatigue and achieving THR. 2. Perfusion images reveal mild diaphragmatic attenuation  artifact in the inferior wall without evidence of ischemia or scar.  LVEF Later at 45% although visually appears to be normal.   3. This is a low risk stress test. Clinical correlation recommended. No previous exam available for comparison.  Assessment:     ICD-10-CM   1. Hypertriglyceridemia  E78.1   2. Essential hypertension  99991111 Basic metabolic panel  3. Abnormal EKG  R94.31     EKG 10/27/2018: Normal sinus rhythm at 65 bpm, left atrial enlargement, left axis deviation, IRBBB, diffuse non-specific T wave abnormality.   Recommendations:   I discussed recently obtained exercise nuclear stress test that shows good exercise capacity and no perfusion abnormalities.  LVEF calculated at 45% although visually appeared to be normal.  Stress test felt to be low risk.  Echocardiogram was also discussed revealing normal LVEF, essentially normal echocardiogram.  I suspect his dyspnea on exertion may be related to hypertension as his blood pressure is also elevated today.  I will start him on losartan hydrochlorothiazide.  Will check BMP in 2 weeks for surveillance.  He reports his triglycerides have significantly improved with making diet changes and also medications.  He will need repeat lipid panel in the next few months since being on Crestor.  I have encouraged him to start Vascepa given his risk factors and also continued elevated triglycerides. I will see him back in 4- 6 weeks for follow up on hypertension.    Miquel Dunn, MSN, APRN, FNP-C [facility]. [place] Office: [phone] Fax: [phone]

## 2018-12-12 ENCOUNTER — Encounter: Payer: BC Managed Care – PPO | Admitting: Physical Medicine and Rehabilitation

## 2018-12-13 ENCOUNTER — Encounter: Payer: Self-pay | Admitting: Cardiology

## 2018-12-18 DIAGNOSIS — C61 Malignant neoplasm of prostate: Secondary | ICD-10-CM | POA: Diagnosis not present

## 2018-12-18 DIAGNOSIS — R0609 Other forms of dyspnea: Secondary | ICD-10-CM | POA: Diagnosis not present

## 2018-12-18 DIAGNOSIS — Z1331 Encounter for screening for depression: Secondary | ICD-10-CM | POA: Diagnosis not present

## 2018-12-18 DIAGNOSIS — E119 Type 2 diabetes mellitus without complications: Secondary | ICD-10-CM | POA: Diagnosis not present

## 2018-12-18 DIAGNOSIS — I10 Essential (primary) hypertension: Secondary | ICD-10-CM | POA: Diagnosis not present

## 2018-12-25 ENCOUNTER — Other Ambulatory Visit: Payer: Self-pay | Admitting: Cardiology

## 2019-01-01 ENCOUNTER — Telehealth: Payer: Self-pay | Admitting: Orthopedic Surgery

## 2019-01-01 NOTE — Telephone Encounter (Signed)
Patient relays he is re-scheduled for the nerve conduction study with Dr Ernestina Patches 01/09/19; states was unable to go to initially scheduled appointment 12/12/18. States his right hand pain has increased; asking if any recommendation until this appointment. States brace is not helping much at all. [facility], [place], if any medication prescribed.

## 2019-01-02 NOTE — Telephone Encounter (Signed)
I called patient to advise. Left message for him to call back.

## 2019-01-02 NOTE — Telephone Encounter (Signed)
ibuproefen OTC 800 mg 3 x a day   Please get nerve study

## 2019-01-04 ENCOUNTER — Telehealth: Payer: Self-pay | Admitting: *Deleted

## 2019-01-04 NOTE — Telephone Encounter (Signed)
Spoke with patient - said [facility] not [facility] per Dr Aline Brochure 01/02/19 - ibuprofen OTC 800 mg 3x a day  - patient relays that he had re-scheduled his nerve study with Dr Ernestina Patches, then found that he will have to re-schedule again due to having to go out of town; Abigail Butts is aware and may have an option in cancellation slot.

## 2019-01-04 NOTE — Telephone Encounter (Signed)
Called pt and lvm #1 to offer pt an earlier appt on 01/05/2019.

## 2019-01-04 NOTE — Telephone Encounter (Addendum)
OTC means over the counter. I have called patient to advise he should use OTC Ibuprofen 200 mg x4 = 800 mg  I left message for him to call back so can advise

## 2019-01-05 ENCOUNTER — Encounter: Payer: Self-pay | Admitting: Physical Medicine and Rehabilitation

## 2019-01-05 ENCOUNTER — Other Ambulatory Visit: Payer: Self-pay

## 2019-01-05 ENCOUNTER — Ambulatory Visit (INDEPENDENT_AMBULATORY_CARE_PROVIDER_SITE_OTHER): Payer: BC Managed Care – PPO | Admitting: Physical Medicine and Rehabilitation

## 2019-01-05 DIAGNOSIS — R202 Paresthesia of skin: Secondary | ICD-10-CM

## 2019-01-05 NOTE — Progress Notes (Signed)
  Numeric Pain Rating Scale and Functional Assessment Average Pain 4   In the last MONTH (on 0-10 scale) has pain interfered with the following?  1. General activity like being  able to carry out your everyday physical activities such as walking, climbing stairs, carrying groceries, or moving a chair?  Rating(5)   

## 2019-01-08 ENCOUNTER — Ambulatory Visit: Payer: BC Managed Care – PPO | Admitting: Cardiology

## 2019-01-08 NOTE — Progress Notes (Signed)
[name] - 63 y.o. male MRN [id number]  Date of birth: Jan 17, 1956  Office Visit Note: Visit Date: 01/05/2019 PCP: [name], MD Referred by: [name], MD  Subjective: Chief Complaint  Patient presents with  . Right Hand - Numbness, Pain   HPI: [name] is a 63 y.o. male who comes in today At the request of Dr. Arther Abbott for electrodiagnostic study of the right upper limb.  Patient is having pain numbness and tingling in the right hand particularly in the radial 3 digits.  He reports almost constant numbness at this point.  Has been worsening over the last year.  He gets increasing symptoms with working with his hands and it keeps him up at night.  He is right-hand dominant.  He has had no prior electrodiagnostic studies.  He is type II non-insulin-dependent diabetic.  He has had no real symptoms of radicular pain.  He does have some symptoms on the left hand but not as bad as the right hand.  ROS Otherwise per HPI.  Assessment & Plan: Visit Diagnoses:  1. Paresthesia of skin     Plan: Impression: The above electrodiagnostic study is ABNORMAL and reveals evidence of:  1. A moderate to severe right median nerve entrapment at the wrist (carpal tunnel syndrome) affecting sensory and motor components.   2. A moderate left median nerve entrapment at the wrist (carpal tunnel syndrome) affecting sensory and motor components.   There is no significant electrodiagnostic evidence of any other focal nerve entrapment, brachial plexopathy or generalized peripheral neuropathy.   Recommendations: 1.  Follow-up with referring physician. 2.  Continue current management of symptoms. 3.  Continue use of resting splint at night-time and as needed during the day. 4.  Suggest surgical evaluation.  Meds & Orders: No orders of the defined types were placed in this encounter.   Orders Placed This Encounter  Procedures  . NCV with EMG (electromyography)     Follow-up: Return for Arther Abbott, MD.   Procedures: No procedures performed  EMG & NCV Findings: Evaluation of the left median motor and the right median motor nerves showed prolonged distal onset latency (L4.5, R5.3 ms) and decreased conduction velocity (Elbow-Wrist, L45, R46 m/s).  The left median (across palm) sensory nerve showed prolonged distal peak latency (Wrist, 4.7 ms).  The right median (across palm) sensory nerve showed prolonged distal peak latency (Wrist, 6.8 ms), reduced amplitude (3.2 V), and prolonged distal peak latency (Palm, 2.1 ms).  All remaining nerves (as indicated in the following tables) were within normal limits.  Left vs. Right side comparison data for the median motor nerve indicates abnormal L-R latency difference (0.8 ms).    All examined muscles (as indicated in the following table) showed no evidence of electrical instability.    Impression: The above electrodiagnostic study is ABNORMAL and reveals evidence of:  1. A moderate to severe right median nerve entrapment at the wrist (carpal tunnel syndrome) affecting sensory and motor components.   2. A moderate left median nerve entrapment at the wrist (carpal tunnel syndrome) affecting sensory and motor components.   There is no significant electrodiagnostic evidence of any other focal nerve entrapment, brachial plexopathy or generalized peripheral neuropathy.   Recommendations: 1.  Follow-up with referring physician. 2.  Continue current management of symptoms. 3.  Continue use of resting splint at night-time and as needed during the day. 4.  Suggest surgical evaluation.  ___________________________ [employer] Board Certified, American Board of Physical  Medicine and Rehabilitation    Nerve Conduction Studies Anti Sensory Summary Table   Stim Site NR Peak (ms) Norm Peak (ms) P-T Amp (V) Norm P-T Amp Site1 Site2 Delta-P (ms) Dist (cm) Vel (m/s) Norm Vel (m/s)  Left Median Acr Palm Anti  Sensory (2nd Digit)  31.4C  Wrist    *4.7 <3.6 13.7 >10 Wrist Palm 2.8 0.0    Palm    1.9 <2.0 15.6         Right Median Acr Palm Anti Sensory (2nd Digit)  31.9C  Wrist    *6.8 <3.6 *3.2 >10 Wrist Palm 4.7 0.0    Palm    *2.1 <2.0 3.0         Right Radial Anti Sensory (Base 1st Digit)  33.5C  Wrist    2.2 <3.1 25.0  Wrist Base 1st Digit 2.2 0.0    Right Ulnar Anti Sensory (5th Digit)  32.3C  Wrist    3.5 <3.7 21.6 >15.0 Wrist 5th Digit 3.5 14.0 40 >38   Motor Summary Table   Stim Site NR Onset (ms) Norm Onset (ms) O-P Amp (mV) Norm O-P Amp Site1 Site2 Delta-0 (ms) Dist (cm) Vel (m/s) Norm Vel (m/s)  Left Median Motor (Abd Poll Brev)  31.5C  Wrist    *4.5 <4.2 6.7 >5 Elbow Wrist 5.0 22.5 *45 >50  Elbow    9.5  6.5         Right Median Motor (Abd Poll Brev)  33.3C  Wrist    *5.3 <4.2 7.1 >5 Elbow Wrist 5.0 23.0 *46 >50  Elbow    10.3  3.4         Right Ulnar Motor (Abd Dig Min)  32.7C  Wrist    3.3 <4.2 8.9 >3 B Elbow Wrist 3.9 22.0 56 >53  B Elbow    7.2  8.6  A Elbow B Elbow 1.4 10.5 75 >53  A Elbow    8.6  8.3          EMG   Side Muscle Nerve Root Ins Act Fibs Psw Amp Dur Poly Recrt Int Fraser Din Comment  Right Abd Poll Brev Median C8-T1 Nml Nml Nml Nml Nml 0 Nml Nml   Right 1stDorInt Ulnar C8-T1 Nml Nml Nml Nml Nml 0 Nml Nml   Right PronatorTeres Median C6-7 Nml Nml Nml Nml Nml 0 Nml Nml   Right Biceps Musculocut C5-6 Nml Nml Nml Nml Nml 0 Nml Nml   Right Deltoid Axillary C5-6 Nml Nml Nml Nml Nml 0 Nml Nml     Nerve Conduction Studies Anti Sensory Left/Right Comparison   Stim Site L Lat (ms) R Lat (ms) L-R Lat (ms) L Amp (V) R Amp (V) L-R Amp (%) Site1 Site2 L Vel (m/s) R Vel (m/s) L-R Vel (m/s)  Median Acr Palm Anti Sensory (2nd Digit)  31.4C  Wrist *4.7 *6.8 2.1 13.7 *3.2 76.6 Wrist Palm     Palm 1.9 *2.1 0.2 15.6 3.0 80.8       Radial Anti Sensory (Base 1st Digit)  33.5C  Wrist  2.2   25.0  Wrist Base 1st Digit     Ulnar Anti Sensory (5th Digit)  32.3C  Wrist   3.5   21.6  Wrist 5th Digit  40    Motor Left/Right Comparison   Stim Site L Lat (ms) R Lat (ms) L-R Lat (ms) L Amp (mV) R Amp (mV) L-R Amp (%) Site1 Site2 L Vel (m/s) R Vel (m/s) L-R Vel (m/s)  Median Motor (Abd Poll Brev)  31.5C  Wrist *4.5 *5.3 *0.8 6.7 7.1 5.6 Elbow Wrist *45 *46 1  Elbow 9.5 10.3 0.8 6.5 3.4 47.7       Ulnar Motor (Abd Dig Min)  32.7C  Wrist  3.3   8.9  B Elbow Wrist  56   B Elbow  7.2   8.6  A Elbow B Elbow  75   A Elbow  8.6   8.3           Waveforms:                Clinical History: No specialty comments available.   He reports that he has never smoked. He has never used smokeless tobacco. No results for input(s): HGBA1C, LABURIC in the last 8760 hours.  Objective:  VS:  HT:    WT:   BMI:     BP:   HR: bpm  TEMP: ( )  RESP:  Physical Exam Musculoskeletal:        General: No tenderness.     Comments: Inspection reveals no atrophy of the bilateral APB or FDI or hand intrinsics. There is no swelling, color changes, allodynia or dystrophic changes. There is 5 out of 5 strength in the bilateral wrist extension, finger abduction and long finger flexion. There is intact sensation to light touch in all dermatomal and peripheral nerve distributions. There is a negative Hoffmann's test bilaterally.  Skin:    General: Skin is warm and dry.     Findings: No erythema or rash.  Neurological:     General: No focal deficit present.     Mental Status: He is alert and oriented to person, place, and time.     Sensory: No sensory deficit.     Motor: No weakness or abnormal muscle tone.     Coordination: Coordination normal.     Gait: Gait normal.  Psychiatric:        Mood and Affect: Mood normal.        Behavior: Behavior normal.        Thought Content: Thought content normal.     Ortho Exam Imaging: No results found.  Past Medical/Family/Surgical/Social History: Medications & Allergies reviewed per EMR, new medications updated. Patient Active  Problem List   Diagnosis Date Noted  . HTN (hypertension) 10/27/2018  . Prostate cancer ([place]) 05/30/2017  . Malignant neoplasm of prostate ([place]) 04/28/2017  . Chronic pain of both knees 02/21/2017  . Unilateral primary osteoarthritis, left knee 02/21/2017  . Unilateral primary osteoarthritis, right knee 02/21/2017   Past Medical History:  Diagnosis Date  . Arthritis   . Diabetes mellitus without complication ([place]) XX123456   Diabetes Type II  . HTN (hypertension) 10/27/2018  . Hyperlipidemia   . Internal hemorrhoids 2014   noted on colonoscopy  . Osteoarthritis of both knees   . Pre-diabetes    questionable  . Prostate cancer ([place])   . Sigmoid diverticulosis 2014   Mild, noted on colonoscopy   Family History  Problem Relation Age of Onset  . Cancer Mother        small cell lung   Past Surgical History:  Procedure Laterality Date  . COLONOSCOPY N/A 09/13/2012   Procedure: COLONOSCOPY;  Surgeon: Danie Binder, MD;  [facility];  Service: Endoscopy;  Laterality: N/A;  9:30 AM  . HERNIA REPAIR     20 years ago  . KNEE ARTHROSCOPY Left    15 years ago  . LYMPHADENECTOMY  Bilateral 05/30/2017   Procedure: LYMPHADENECTOMY, PELVIC EXTENDED;  Surgeon: [name], MD;  Location[facility];  Service: Urology;  Laterality: Bilateral;  . ROBOT ASSISTED LAPAROSCOPIC RADICAL PROSTATECTOMY N/A 05/30/2017   Procedure: XI ROBOTIC ASSISTED LAPAROSCOPIC RADICAL PROSTATECTOMY LEVEL 3;  Surgeon: [name], MD;  Location[facility];  Service: Urology;  Laterality: N/A;  . SHOULDER ARTHROSCOPY Right    15 years ago   Social History   Occupational History  . Occupation: pastor  Tobacco Use  . Smoking status: Never Smoker  . Smokeless tobacco: Never Used  Substance and Sexual Activity  . Alcohol use: No  . Drug use: No  . Sexual activity: Yes

## 2019-01-08 NOTE — Procedures (Signed)
EMG & NCV Findings: Evaluation of the left median motor and the right median motor nerves showed prolonged distal onset latency (L4.5, R5.3 ms) and decreased conduction velocity (Elbow-Wrist, L45, R46 m/s).  The left median (across palm) sensory nerve showed prolonged distal peak latency (Wrist, 4.7 ms).  The right median (across palm) sensory nerve showed prolonged distal peak latency (Wrist, 6.8 ms), reduced amplitude (3.2 V), and prolonged distal peak latency (Palm, 2.1 ms).  All remaining nerves (as indicated in the following tables) were within normal limits.  Left vs. Right side comparison data for the median motor nerve indicates abnormal L-R latency difference (0.8 ms).    All examined muscles (as indicated in the following table) showed no evidence of electrical instability.    Impression: The above electrodiagnostic study is ABNORMAL and reveals evidence of:  1. A moderate to severe right median nerve entrapment at the wrist (carpal tunnel syndrome) affecting sensory and motor components.   2. A moderate left median nerve entrapment at the wrist (carpal tunnel syndrome) affecting sensory and motor components.   There is no significant electrodiagnostic evidence of any other focal nerve entrapment, brachial plexopathy or generalized peripheral neuropathy.   Recommendations: 1.  Follow-up with referring physician. 2.  Continue current management of symptoms. 3.  Continue use of resting splint at night-time and as needed during the day. 4.  Suggest surgical evaluation.  ___________________________ Laurence Spates FAAPMR Board Certified, American Board of Physical Medicine and Rehabilitation    Nerve Conduction Studies Anti Sensory Summary Table   Stim Site NR Peak (ms) Norm Peak (ms) P-T Amp (V) Norm P-T Amp Site1 Site2 Delta-P (ms) Dist (cm) Vel (m/s) Norm Vel (m/s)  Left Median Acr Palm Anti Sensory (2nd Digit)  31.4C  Wrist    *4.7 <3.6 13.7 >10 Wrist Palm 2.8 0.0    Palm     1.9 <2.0 15.6         Right Median Acr Palm Anti Sensory (2nd Digit)  31.9C  Wrist    *6.8 <3.6 *3.2 >10 Wrist Palm 4.7 0.0    Palm    *2.1 <2.0 3.0         Right Radial Anti Sensory (Base 1st Digit)  33.5C  Wrist    2.2 <3.1 25.0  Wrist Base 1st Digit 2.2 0.0    Right Ulnar Anti Sensory (5th Digit)  32.3C  Wrist    3.5 <3.7 21.6 >15.0 Wrist 5th Digit 3.5 14.0 40 >38   Motor Summary Table   Stim Site NR Onset (ms) Norm Onset (ms) O-P Amp (mV) Norm O-P Amp Site1 Site2 Delta-0 (ms) Dist (cm) Vel (m/s) Norm Vel (m/s)  Left Median Motor (Abd Poll Brev)  31.5C  Wrist    *4.5 <4.2 6.7 >5 Elbow Wrist 5.0 22.5 *45 >50  Elbow    9.5  6.5         Right Median Motor (Abd Poll Brev)  33.3C  Wrist    *5.3 <4.2 7.1 >5 Elbow Wrist 5.0 23.0 *46 >50  Elbow    10.3  3.4         Right Ulnar Motor (Abd Dig Min)  32.7C  Wrist    3.3 <4.2 8.9 >3 B Elbow Wrist 3.9 22.0 56 >53  B Elbow    7.2  8.6  A Elbow B Elbow 1.4 10.5 75 >53  A Elbow    8.6  8.3          EMG   Side Muscle  Nerve Root Ins Act Fibs Psw Amp Dur Poly Recrt Int Fraser Din Comment  Right Abd Poll Brev Median C8-T1 Nml Nml Nml Nml Nml 0 Nml Nml   Right 1stDorInt Ulnar C8-T1 Nml Nml Nml Nml Nml 0 Nml Nml   Right PronatorTeres Median C6-7 Nml Nml Nml Nml Nml 0 Nml Nml   Right Biceps Musculocut C5-6 Nml Nml Nml Nml Nml 0 Nml Nml   Right Deltoid Axillary C5-6 Nml Nml Nml Nml Nml 0 Nml Nml     Nerve Conduction Studies Anti Sensory Left/Right Comparison   Stim Site L Lat (ms) R Lat (ms) L-R Lat (ms) L Amp (V) R Amp (V) L-R Amp (%) Site1 Site2 L Vel (m/s) R Vel (m/s) L-R Vel (m/s)  Median Acr Palm Anti Sensory (2nd Digit)  31.4C  Wrist *4.7 *6.8 2.1 13.7 *3.2 76.6 Wrist Palm     Palm 1.9 *2.1 0.2 15.6 3.0 80.8       Radial Anti Sensory (Base 1st Digit)  33.5C  Wrist  2.2   25.0  Wrist Base 1st Digit     Ulnar Anti Sensory (5th Digit)  32.3C  Wrist  3.5   21.6  Wrist 5th Digit  40    Motor Left/Right Comparison   Stim Site L Lat (ms)  R Lat (ms) L-R Lat (ms) L Amp (mV) R Amp (mV) L-R Amp (%) Site1 Site2 L Vel (m/s) R Vel (m/s) L-R Vel (m/s)  Median Motor (Abd Poll Brev)  31.5C  Wrist *4.5 *5.3 *0.8 6.7 7.1 5.6 Elbow Wrist *45 *46 1  Elbow 9.5 10.3 0.8 6.5 3.4 47.7       Ulnar Motor (Abd Dig Min)  32.7C  Wrist  3.3   8.9  B Elbow Wrist  56   B Elbow  7.2   8.6  A Elbow B Elbow  75   A Elbow  8.6   8.3           Waveforms:

## 2019-01-08 NOTE — Progress Notes (Signed)
Impression: The above electrodiagnostic study is ABNORMAL and reveals evidence of:  1. A moderate to severe right median nerve entrapment at the wrist (carpal tunnel syndrome) affecting sensory and motor components.   2. A moderate left median nerve entrapment at the wrist (carpal tunnel syndrome) affecting sensory and motor components.   There is no significant electrodiagnostic evidence of any other focal nerve entrapment, brachial plexopathy or generalized peripheral neuropathy.   Recommendations: 1.  Follow-up with referring physician. 2.  Continue current management of symptoms. 3.  Continue use of resting splint at night-time and as needed during the day. 4.  Suggest surgical evaluation.

## 2019-01-08 NOTE — Progress Notes (Deleted)
Primary Physician:  [name], MD   Patient ID: [name], male    DOB: 02-04-1956, 63 y.o.   MRN: [id number]  Subjective:    No chief complaint on file.   HPI: [name]  is a 63 y.o. male  with type 2 diabetes, Prostate CA, OA, recently evaluated for hypertriglyceridemia and newly noted dyspnea on exertion.    With fenofibrate, he reports recent triglyceride levels have improved from 1000 to 250. He has been making significant diet changes. He was started on Vascepa at his last visit. Also started on Losartan HCTZ at his last visit due to elevated BP. He now presents for follow up.  He does have some mild shortness of breath that is new for him. Notices walking short distances. No chest discomfort. No PND or orthopnea. No leg swelling.   He is on Luporn for Prostate Cancer. No history of tobacco use. Denies any family history of heart disease.  Past Medical History:  Diagnosis Date  . Arthritis   . Diabetes mellitus without complication ([place]) [id number]   Diabetes Type II  . HTN (hypertension) 10/27/2018  . Hyperlipidemia   . Internal hemorrhoids 2014   noted on colonoscopy  . Osteoarthritis of both knees   . Pre-diabetes    questionable  . Prostate cancer ([place])   . Sigmoid diverticulosis 2014   Mild, noted on colonoscopy    Past Surgical History:  Procedure Laterality Date  . COLONOSCOPY N/A 09/13/2012   Procedure: COLONOSCOPY;  Surgeon: Danie Binder, MD;  Location: [facility];  Service: Endoscopy;  Laterality: N/A;  9:30 AM  . HERNIA REPAIR     20 years ago  . KNEE ARTHROSCOPY Left    15 years ago  . LYMPHADENECTOMY Bilateral 05/30/2017   Procedure: LYMPHADENECTOMY, PELVIC EXTENDED;  Surgeon: [name], MD;  Location: [facility];  Service: Urology;  Laterality: Bilateral;  . ROBOT ASSISTED LAPAROSCOPIC RADICAL PROSTATECTOMY N/A 05/30/2017   Procedure: XI ROBOTIC ASSISTED LAPAROSCOPIC RADICAL PROSTATECTOMY LEVEL 3;  Surgeon: [name], MD;   Location: [facility];  Service: Urology;  Laterality: N/A;  . SHOULDER ARTHROSCOPY Right    15 years ago    Social History   Socioeconomic History  . Marital status: Married    Spouse name: Not on file  . Number of children: 1  . Years of education: Not on file  . Highest education level: Not on file  Occupational History  . Occupation: Theme park manager  Social Needs  . Financial resource strain: Not on file  . Food insecurity    Worry: Not on file    Inability: Not on file  . Transportation needs    Medical: Not on file    Non-medical: Not on file  Tobacco Use  . Smoking status: Never Smoker  . Smokeless tobacco: Never Used  Substance and Sexual Activity  . Alcohol use: No  . Drug use: No  . Sexual activity: Yes  Lifestyle  . Physical activity    Days per week: Not on file    Minutes per session: Not on file  . Stress: Not on file  Relationships  . Social Herbalist on phone: Not on file    Gets together: Not on file    Attends religious service: Not on file    Active member of club or organization: Not on file    Attends meetings of clubs or organizations: Not on file    Relationship status: Not on  file  . Intimate partner violence    Fear of current or ex partner: Not on file    Emotionally abused: Not on file    Physically abused: Not on file    Forced sexual activity: Not on file  Other Topics Concern  . Not on file  Social History Narrative   Dor[place]   Married to [employer]   Daughter, Abby Gigi Gin, RN III, Resides in [place]    Review of Systems  Constitution: Negative for decreased appetite, malaise/fatigue, weight gain and weight loss.  Eyes: Negative for visual disturbance.  Cardiovascular: Positive for dyspnea on exertion. Negative for chest pain, claudication, leg swelling, orthopnea, palpitations and syncope.  Respiratory: Negative for hemoptysis and wheezing.   Endocrine: Negative for cold intolerance and heat intolerance.   Hematologic/Lymphatic: Does not bruise/bleed easily.  Skin: Negative for nail changes.  Musculoskeletal: Negative for muscle weakness and myalgias.  Gastrointestinal: Negative for abdominal pain, change in bowel habit, nausea and vomiting.  Neurological: Negative for difficulty with concentration, dizziness, focal weakness and headaches.  Psychiatric/Behavioral: Negative for altered mental status and suicidal ideas.  All other systems reviewed and are negative.     Objective:  There were no vitals taken for this visit. There is no height or weight on file to calculate BMI.    Physical Exam  Constitutional: He is oriented to person, place, and time. Vital signs are normal. He appears well-developed and well-nourished.  HENT:  Head: Normocephalic and atraumatic.  Neck: Normal range of motion.  Cardiovascular: Normal rate, regular rhythm, normal heart sounds and intact distal pulses.  Trace edema bilaterally  Pulmonary/Chest: Effort normal and breath sounds normal. No accessory muscle usage. No respiratory distress.  Abdominal: Soft. Bowel sounds are normal.  Musculoskeletal: Normal range of motion.  Neurological: He is alert and oriented to person, place, and time.  Skin: Skin is warm and dry.  Vitals reviewed.  Radiology: No results found.  Laboratory examination:   PCP labs 10/06/2018: Normal H&H, WBC low at 3.57, platelet count low at 124 cholesterol 189, triglycerides 1000, HDL 26, LDL unable to be calculated. Non- HDL 163.  CMP Latest Ref Rng & Units 05/26/2017  Glucose 65 - 99 mg/dL 258(H)  BUN 6 - 20 mg/dL 14  Creatinine 0.61 - 1.24 mg/dL 1.07  Sodium 135 - 145 mmol/L 141  Potassium 3.5 - 5.1 mmol/L 4.9  Chloride 101 - 111 mmol/L 105  CO2 22 - 32 mmol/L 28  Calcium 8.9 - 10.3 mg/dL 9.1   CBC Latest Ref Rng & Units 05/31/2017 05/30/2017 05/26/2017  WBC 4.0 - 10.5 K/uL - - 5.5  Hemoglobin 13.0 - 17.0 g/dL 13.2 15.0 16.4  Hematocrit 39.0 - 52.0 % 37.2(L) 41.0 45.9   Platelets 150 - 400 K/uL - - 129(L)   Lipid Panel     Component Value Date/Time   LDLDIRECT 113 (H) 10/30/2018 0831   HEMOGLOBIN A1C Lab Results  Component Value Date   HGBA1C 7.1 (H) 05/26/2017   MPG 157.07 05/26/2017   TSH No results for input(s): TSH in the last 8760 hours.  PRN Meds:. There are no discontinued medications. No outpatient medications have been marked as taking for the 01/08/19 encounter (Appointment) with Miquel Dunn, NP.    Cardiac Studies:   Echocardiogram 12/07/2018: Left ventricle cavity is normal in size. Normal left ventricular wall thickness. Normal LV systolic function with EF 55%. Normal global wall motion. Normal diastolic filling pattern. Calculated EF 55%. Mild (Grade I) mitral regurgitation.  No evidence of pulmonary hypertension.  Exercise myoview stress test  12/04/2018: 1. Patient exercised on the Bruce protocol. They exercised for a total of 8:34 min., achieving approximately 7 METs. Resting EKG demonstrates normal sinus rhythm, stress EKG is negative for myocardial ischemia. Normal blood pressure response.  Stress terminated due to fatigue and achieving THR. 2. Perfusion images reveal mild diaphragmatic attenuation artifact in the inferior wall without evidence of ischemia or scar.  LVEF Later at 45% although visually appears to be normal.   3. This is a low risk stress test. Clinical correlation recommended. No previous exam available for comparison.  Assessment:   No diagnosis found.  EKG 10/27/2018: Normal sinus rhythm at 65 bpm, left atrial enlargement, left axis deviation, IRBBB, diffuse non-specific T wave abnormality.   Recommendations:   I discussed recently obtained exercise nuclear stress test that shows good exercise capacity and no perfusion abnormalities.  LVEF calculated at 45% although visually appeared to be normal.  Stress test felt to be low risk.  Echocardiogram was also discussed revealing normal LVEF,  essentially normal echocardiogram.  I suspect his dyspnea on exertion may be related to hypertension as his blood pressure is also elevated today.  I will start him on losartan hydrochlorothiazide.  Will check BMP in 2 weeks for surveillance.  He reports his triglycerides have significantly improved with making diet changes and also medications.  He will need repeat lipid panel in the next few months since being on Crestor.  I have encouraged him to start Vascepa given his risk factors and also continued elevated triglycerides. I will see him back in 4- 6 weeks for follow up on hypertension.    Miquel Dunn, MSN, APRN, FNP-C [facility]. [place] Office: [phone] Fax: [phone]

## 2019-01-09 ENCOUNTER — Encounter: Payer: BC Managed Care – PPO | Admitting: Physical Medicine and Rehabilitation

## 2019-01-15 ENCOUNTER — Telehealth: Payer: Self-pay | Admitting: Orthopedic Surgery

## 2019-01-15 NOTE — Telephone Encounter (Signed)
Relayed

## 2019-01-15 NOTE — Telephone Encounter (Signed)
Please advise of nerve conduction testing by Dr Ernestina Patches, done Friday, 01/05/19; ph# [phone] or [phone], designated contact, spouse.

## 2019-01-17 ENCOUNTER — Telehealth: Payer: Self-pay | Admitting: Orthopedic Surgery

## 2019-01-17 ENCOUNTER — Telehealth: Payer: Self-pay | Admitting: Radiology

## 2019-01-17 ENCOUNTER — Other Ambulatory Visit: Payer: Self-pay | Admitting: Orthopedic Surgery

## 2019-01-17 NOTE — Telephone Encounter (Signed)
Results given to patient patient wants right carpal tunnel release done on December 3 we will make arrangements risk and benefits of surgery surgical procedure explained to the patient

## 2019-01-17 NOTE — Telephone Encounter (Signed)
-----   Message from Carole Civil, MD sent at 01/17/2019  1:23 PM EDT ----- Right carpal tunnel release December 3

## 2019-01-17 NOTE — Telephone Encounter (Signed)
Left message to advise surgery orders sent to [facility], they will call him with preop appointment for labs and covid screen and they will let him know what time to arrive on Dec 3rd for the surgery, if he has any questions or needs to RS he is to call me back.

## 2019-02-04 IMAGING — PT NM PET NOPR SKULL BASE TO THIGH
8 series · 25 of 25 positions shown · non-contrast
Comparison: None.

CLINICAL DATA: Prostate carcinoma with biochemical recurrence. PSA
equal 15.6 on 03/09/2017. Gleason 9 carcinoma.

EXAM:
NUCLEAR MEDICINE PET SKULL BASE TO THIGH
TECHNIQUE: 10.5 mCi F-18 Fluciclovine was injected intravenously. Full-ring PET
imaging was performed from the skull base to thigh after the
radiotracer. CT data was obtained and used for attenuation
correction and anatomic localization.

[Series 3: pet sk_thigh ac · axial · 5.0mm · 4.07mm/px · z∈[+946,+1866]mm · 5 of 231 slices shown]
[im 1/231]
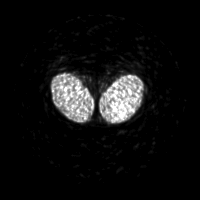
[im 58/231]
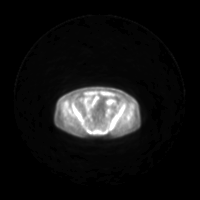
[im 116/231]
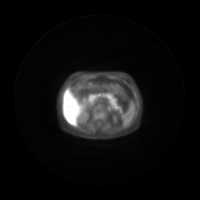
[im 173/231]
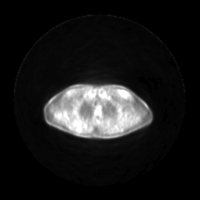
[im 231/231]
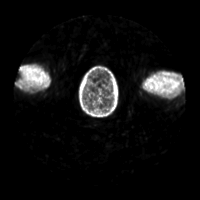

[Series 4: ct sk_thigh 5.0 b31f · axial · 5.0mm · 0.98mm/px · z∈[+946,+1866]mm · 5 of 231 slices shown]
[im 1/231]
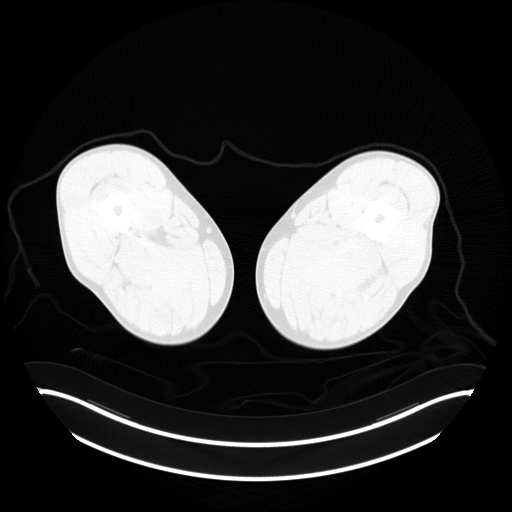
[im 58/231]
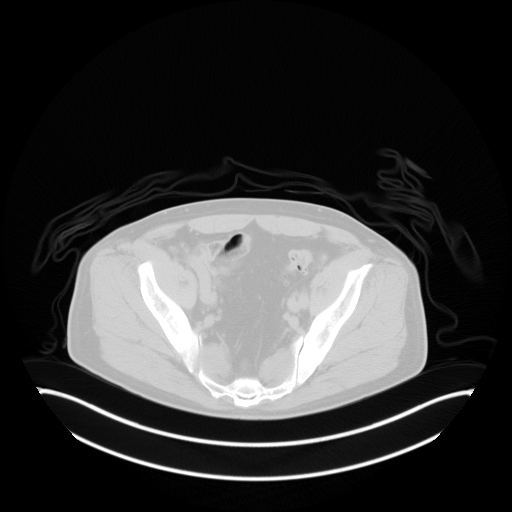
[im 116/231]
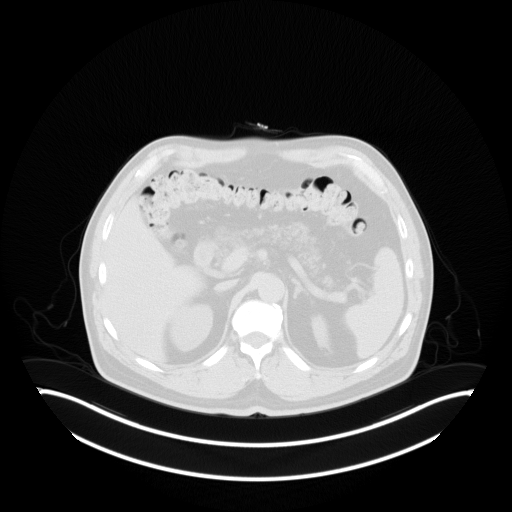
[im 173/231]
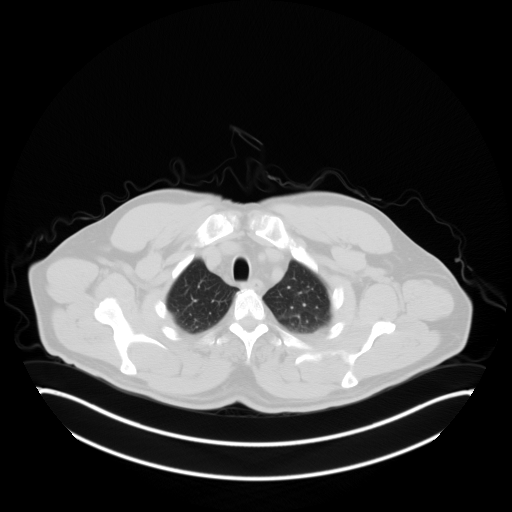
[im 231/231  brain]
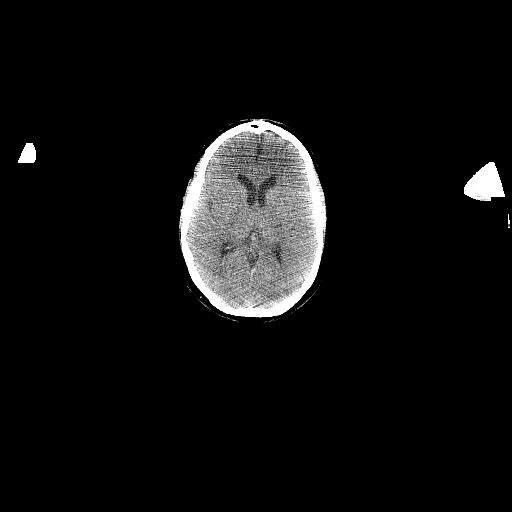

[Series 5: pet sk_thigh nac · axial · 5.0mm · 4.07mm/px · z∈[+946,+1866]mm · 5 of 231 slices shown]
[im 1/231]
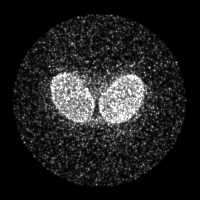
[im 58/231]
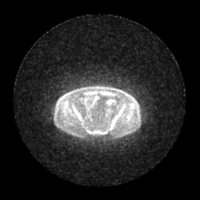
[im 116/231]
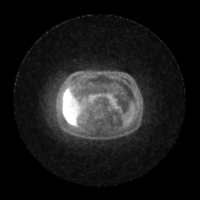
[im 173/231]
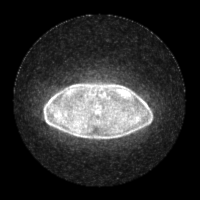
[im 231/231]
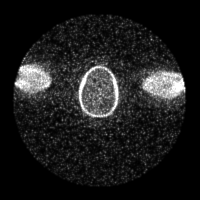

[Series 8: ct sk_thigh 5.0 b70f (id)_bone · axial · 5.0mm · 0.64mm/px · z∈[+1418,+1706]mm · 2 of 73 slices shown]
[im 1/73  bone]
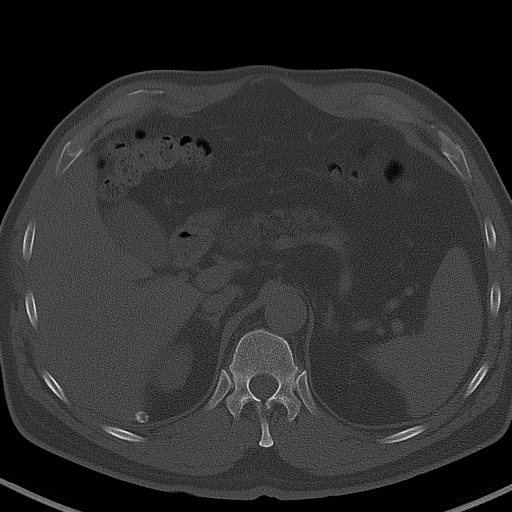
[im 73/73  bone]
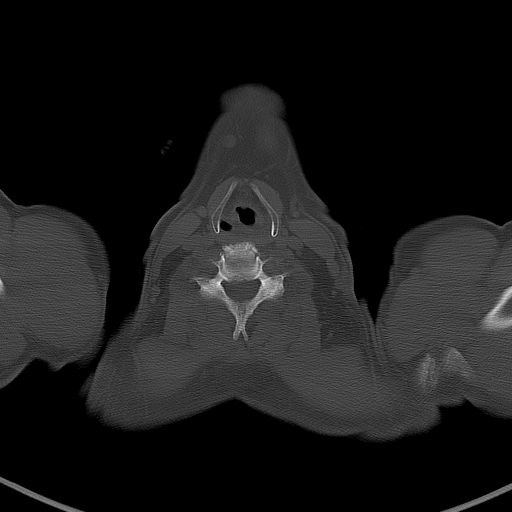

[Series 603: range-ct sk_thigh 5.0 (id)<alpha range> · 1 of 35 slices shown (1 of 2)]
[im 1/35]
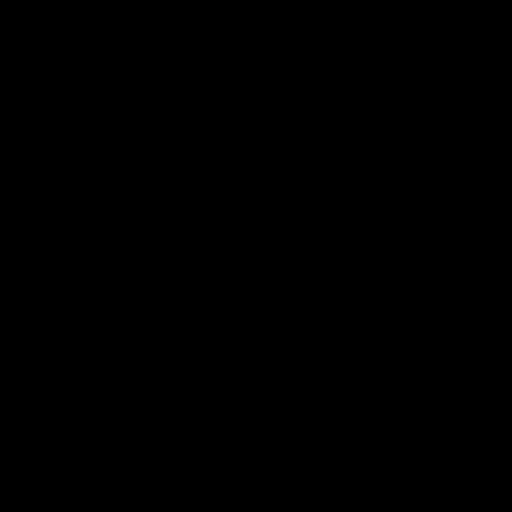

[Series 604: mip range · coronal · 1.91mm/px · 1 of 32 slices shown]
[im 1/32]
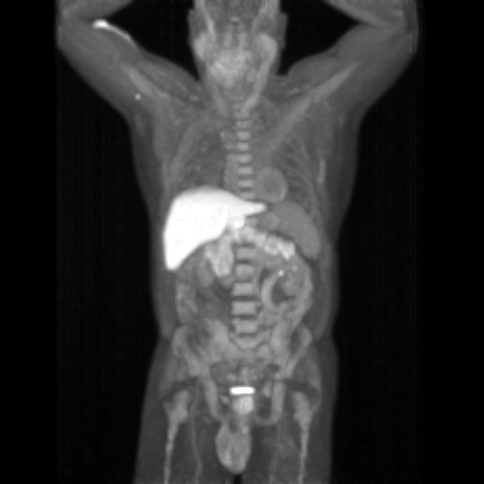

[Series 605: range-ct sk_thigh 5.0 (id)<alpha range> · 5 of 225 slices shown (2 of 2)]
[im 1/225]
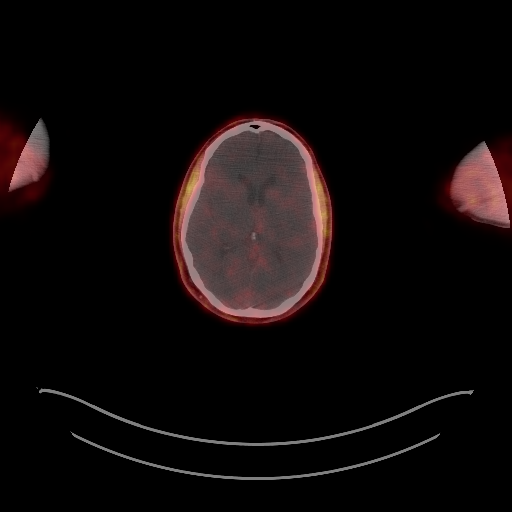
[im 57/225]
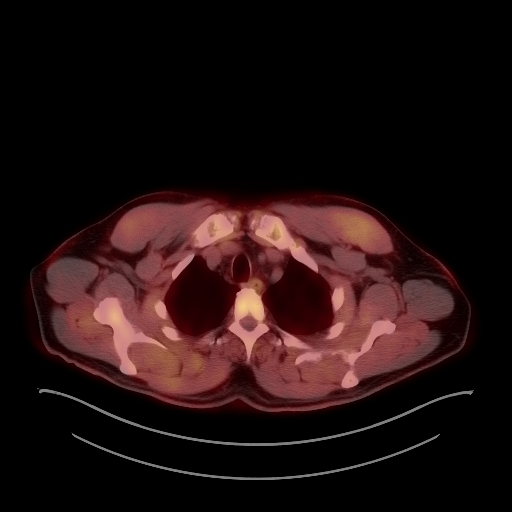
[im 113/225]
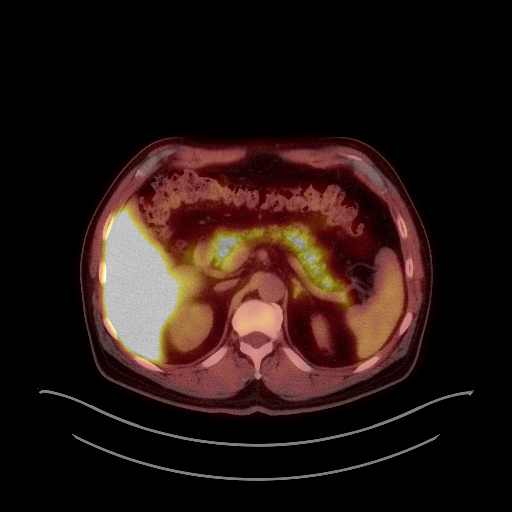
[im 169/225]
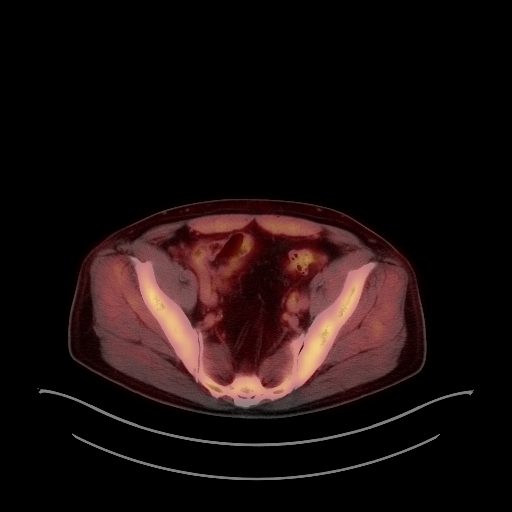
[im 225/225]
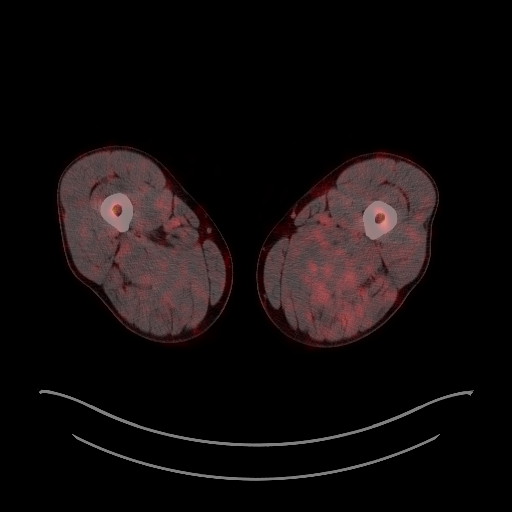

[Series 1079: results mm oncology reading · 1.0mm · 0.86mm/px · 1 of 1 slices shown]
[im 1/1]
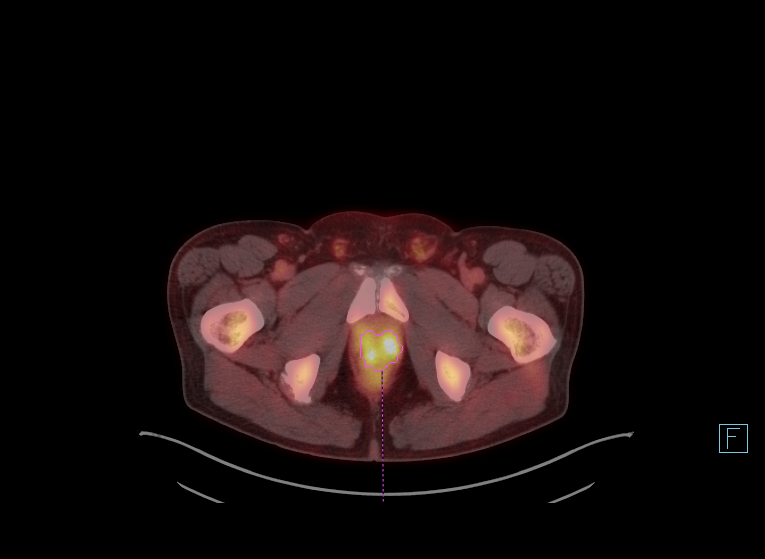

[25 of 25 positions shown; findings below may reference images not displayed]

FINDINGS: NECK

No radiotracer activity in neck lymph nodes.

CHEST

No radiotracer accumulation within mediastinal or hilar lymph nodes.
No suspicious pulmonary nodules on the CT scan.

ABDOMEN/PELVIS

Intense radiotracer activity within the prostate gland mildly
asymmetric activity in the LEFT gland slightly larger than RIGHT.
SUV max equals 6.2.

No radiotracer activity within pelvic lymph nodes. No radiotracer
activity in periaortic lymph nodes. No enlarged lymph nodes
identified.

Physiologic activity noted within the liver and pancreas.

SKELETON

No focal  activity to suggest skeletal metastasis.
IMPRESSION: 1. Intense radiotracer activity within the prostate gland consistent
with prostate carcinoma.
2. No evidence of metastatic pelvic or abdominal lymph nodes.
3. No evidence of distant metastatic disease or skeletal disease.

## 2019-02-13 NOTE — Patient Instructions (Signed)
PERICLES CLUSTER  02/13/2019     @PREFPERIOPPHARMACY @   Your procedure is scheduled on  02/22/2019 .  Report to [facility] at  [email or web address].  Call this number if you have problems the morning of surgery:  [phone]   Remember:  Do not eat or drink after midnight.                       Take these medicines the morning of surgery with A SIP OF WATER  lexapro    Do not wear jewelry, make-up or nail polish.  Do not wear lotions, powders, or perfumes, or deodorant. Please brush our teeth.  Do not shave 48 hours prior to surgery.  Men may shave face and neck.  Do not bring valuables to the hospital.  [facility] is not responsible for any belongings or valuables.  Contacts, dentures or bridgework may not be worn into surgery.  Leave your suitcase in the car.  After surgery it may be brought to your room.  For patients admitted to the hospital, discharge time will be determined by your treatment team.  Patients discharged the day of surgery will not be allowed to drive home.   Name and phone number of your driver:   family Special instructions:  None  Please read over the following fact sheets that you were given. Anesthesia Post-op Instructions and Care and Recovery After Surgery       Open Carpal Tunnel Release, Care After This sheet gives you information about how to care for yourself after your procedure. Your health care provider may also give you more specific instructions. If you have problems or questions, contact your health care provider. What can I expect after the procedure? After the procedure, it is common to have:  Wrist stiffness.  Bruising. Follow these instructions at home: Bathing  Do not take baths, swim, or use a hot tub until your health care provider approves. Ask your health care provider if you may take showers.  Keep your bandage (dressing) dry until your health care provider says it can be removed. If you have a splint or brace:   Wear the splint or brace as told by your health care provider. You may need to wear it for 2-3 weeks. Remove it only as told by your health care provider.  Loosen the splint or brace if your fingers tingle, become numb, or turn cold and blue.  Keep the splint or brace clean.  If the splint or brace is not waterproof: ? Do not let it get wet. ? Cover it with a watertight covering when you take a bath or a shower. Incision care   Follow instructions from your health care provider about how to take care of your incision. Make sure you: ? Wash your hands with soap and water before you change your dressing. If soap and water are not available, use hand sanitizer. ? Change your dressing as told by your health care provider. ? Leave stitches (sutures), skin glue, or adhesive strips in place. These skin closures may need to stay in place for 2 weeks or longer. If adhesive strip edges start to loosen and curl up, you may trim the loose edges. Do not remove adhesive strips completely unless your health care provider tells you to do that.  Check your incision area every day for signs of infection. Check for: ? Redness, swelling, or pain. ? Fluid or  blood. ? Warmth. ? Pus or a bad smell. Managing pain, stiffness, and swelling   If directed, put ice on the affected area. ? If you have a removable splint or brace, remove it as told by your health care provider. ? Put ice in a plastic bag. ? Place a towel between your skin and the bag. ? Leave the ice on for 20 minutes, 2-3 times a day.  Move your fingers often to avoid stiffness and to lessen swelling.  Raise (elevate) your wrist above the level of your heart while you are sitting or lying down. Activity  Do not drive until your health care provider approves.  Do not drive or use heavy machinery while taking prescription pain medicine.  Return to your normal activities as told by your health care provider. Avoid activities that cause  pain.  If physical therapy was prescribed, do exercises as told by your therapist. Physical therapy can help you heal faster and regain movement. General instructions  Take over-the-counter and prescription medicines only as told by your health care provider.  If you are taking prescription pain medicine, take actions to prevent or treat constipation. Your health care provider may recommend that you: ? Drink enough fluid to keep your urine pale yellow. ? Eat foods that are high in fiber, such as fresh fruits and vegetables, whole grains, and beans. ? Limit foods that are high in fat and processed sugars, such as fried or sweet foods. ? Take an over-the-counter or prescription medicine for constipation.  Do not use any products that contain nicotine or tobacco, such as cigarettes and e-cigarettes. If you need help quitting, ask your health care provider.  Keep all follow-up visits as told by your health care provider and physical therapist. This is important. Contact a health care provider if:  You have redness or swelling around your incision.  You have fluid or blood coming from your incision.  Your incision feels warm to the touch.  You have pus or a bad smell coming from your incision.  You have a fever.  You have chills.  You have pain that does not get better with medicine.  Your carpal tunnel symptoms do not go away after 2 months.  Your carpal tunnel symptoms go away and then come back. Get help right away if:  You have pain or numbness that is getting worse.  Your fingers or fingertips become very pale or bluish in color.  You are not able to move your fingers. Summary  It is common to have wrist stiffness and bruising after a carpal tunnel release.  Icing and raising (elevating) your wrist may help to lessen swelling and pain.  Call your health care provider if you have a fever or notice any signs of infection in your incision area. This information is not  intended to replace advice given to you by your health care provider. Make sure you discuss any questions you have with your health care provider. Document Released: 09/25/2004 Document Revised: 02/18/2017 Document Reviewed: 11/15/2016 Elsevier Patient Education  2020 [employer] After These instructions provide you with information about caring for yourself after your procedure. Your health care provider may also give you more specific instructions. Your treatment has been planned according to current medical practices, but problems sometimes occur. Call your health care provider if you have any problems or questions after your procedure. What can I expect after the procedure? After your procedure, you may:  Feel sleepy for  several hours.  Feel clumsy and have poor balance for several hours.  Feel forgetful about what happened after the procedure.  Have poor judgment for several hours.  Feel nauseous or vomit.  Have a sore throat if you had a breathing tube during the procedure. Follow these instructions at home: For at least 24 hours after the procedure:      Have a responsible adult stay with you. It is important to have someone help care for you until you are awake and alert.  Rest as needed.  Do not: ? Participate in activities in which you could fall or become injured. ? Drive. ? Use heavy machinery. ? Drink alcohol. ? Take sleeping pills or medicines that cause drowsiness. ? Make important decisions or sign legal documents. ? Take care of children on your own. Eating and drinking  Follow the diet that is recommended by your health care provider.  If you vomit, drink water, juice, or soup when you can drink without vomiting.  Make sure you have little or no nausea before eating solid foods. General instructions  Take over-the-counter and prescription medicines only as told by your health care provider.  If you have sleep  apnea, surgery and certain medicines can increase your risk for breathing problems. Follow instructions from your health care provider about wearing your sleep device: ? Anytime you are sleeping, including during daytime naps. ? While taking prescription pain medicines, sleeping medicines, or medicines that make you drowsy.  If you smoke, do not smoke without supervision.  Keep all follow-up visits as told by your health care provider. This is important. Contact a health care provider if:  You keep feeling nauseous or you keep vomiting.  You feel light-headed.  You develop a rash.  You have a fever. Get help right away if:  You have trouble breathing. Summary  For several hours after your procedure, you may feel sleepy and have poor judgment.  Have a responsible adult stay with you for at least 24 hours or until you are awake and alert. This information is not intended to replace advice given to you by your health care provider. Make sure you discuss any questions you have with your health care provider. Document Released: 06/29/2015 Document Revised: 06/06/2017 Document Reviewed: 06/29/2015 Elsevier Patient Education  2020 [employer]. How to Use Chlorhexidine for Bathing Chlorhexidine gluconate (CHG) is a germ-killing (antiseptic) solution that is used to clean the skin. It can get rid of the bacteria that normally live on the skin and can keep them away for about 24 hours. To clean your skin with CHG, you may be given:  A CHG solution to use in the shower or as part of a sponge bath.  A prepackaged cloth that contains CHG. Cleaning your skin with CHG may help lower the risk for infection:  While you are staying in the intensive care unit of the hospital.  If you have a vascular access, such as a central line, to provide short-term or long-term access to your veins.  If you have a catheter to drain urine from your bladder.  If you are on a ventilator. A ventilator is a  machine that helps you breathe by moving air in and out of your lungs.  After surgery. What are the risks? Risks of using CHG include:  A skin reaction.  Hearing loss, if CHG gets in your ears.  Eye injury, if CHG gets in your eyes and is not rinsed out.  The CHG product  catching fire. Make sure that you avoid smoking and flames after applying CHG to your skin. Do not use CHG:  If you have a chlorhexidine allergy or have previously reacted to chlorhexidine.  On babies younger than 33 months of age. How to use CHG solution  Use CHG only as told by your health care provider, and follow the instructions on the label.  Use the full amount of CHG as directed. Usually, this is one bottle. During a shower Follow these steps when using CHG solution during a shower (unless your health care provider gives you different instructions): 1. Start the shower. 2. Use your normal soap and shampoo to wash your face and hair. 3. Turn off the shower or move out of the shower stream. 4. Pour the CHG onto a clean washcloth. Do not use any type of brush or rough-edged sponge. 5. Starting at your neck, lather your body down to your toes. Make sure you follow these instructions: ? If you will be having surgery, pay special attention to the part of your body where you will be having surgery. Scrub this area for at least 1 minute. ? Do not use CHG on your head or face. If the solution gets into your ears or eyes, rinse them well with water. ? Avoid your genital area. ? Avoid any areas of skin that have broken skin, cuts, or scrapes. ? Scrub your back and under your arms. Make sure to wash skin folds. 6. Let the lather sit on your skin for 1-2 minutes or as long as told by your health care provider. 7. Thoroughly rinse your entire body in the shower. Make sure that all body creases and crevices are rinsed well. 8. Dry off with a clean towel. Do not put any substances on your body afterward-such as powder,  lotion, or perfume-unless you are told to do so by your health care provider. Only use lotions that are recommended by the manufacturer. 9. Put on clean clothes or pajamas. 10. If it is the night before your surgery, sleep in clean sheets.  During a sponge bath Follow these steps when using CHG solution during a sponge bath (unless your health care provider gives you different instructions): 1. Use your normal soap and shampoo to wash your face and hair. 2. Pour the CHG onto a clean washcloth. 3. Starting at your neck, lather your body down to your toes. Make sure you follow these instructions: ? If you will be having surgery, pay special attention to the part of your body where you will be having surgery. Scrub this area for at least 1 minute. ? Do not use CHG on your head or face. If the solution gets into your ears or eyes, rinse them well with water. ? Avoid your genital area. ? Avoid any areas of skin that have broken skin, cuts, or scrapes. ? Scrub your back and under your arms. Make sure to wash skin folds. 4. Let the lather sit on your skin for 1-2 minutes or as long as told by your health care provider. 5. Using a different clean, wet washcloth, thoroughly rinse your entire body. Make sure that all body creases and crevices are rinsed well. 6. Dry off with a clean towel. Do not put any substances on your body afterward-such as powder, lotion, or perfume-unless you are told to do so by your health care provider. Only use lotions that are recommended by the manufacturer. 7. Put on clean clothes or pajamas. 8. If it  is the night before your surgery, sleep in clean sheets. How to use CHG prepackaged cloths  Only use CHG cloths as told by your health care provider, and follow the instructions on the label.  Use the CHG cloth on clean, dry skin.  Do not use the CHG cloth on your head or face unless your health care provider tells you to.  When washing with the CHG cloth: ? Avoid your  genital area. ? Avoid any areas of skin that have broken skin, cuts, or scrapes. Before surgery Follow these steps when using a CHG cloth to clean before surgery (unless your health care provider gives you different instructions): 1. Using the CHG cloth, vigorously scrub the part of your body where you will be having surgery. Scrub using a back-and-forth motion for 3 minutes. The area on your body should be completely wet with CHG when you are done scrubbing. 2. Do not rinse. Discard the cloth and let the area air-dry. Do not put any substances on the area afterward, such as powder, lotion, or perfume. 3. Put on clean clothes or pajamas. 4. If it is the night before your surgery, sleep in clean sheets.  For general bathing Follow these steps when using CHG cloths for general bathing (unless your health care provider gives you different instructions). 1. Use a separate CHG cloth for each area of your body. Make sure you wash between any folds of skin and between your fingers and toes. Wash your body in the following order, switching to a new cloth after each step: ? The front of your neck, shoulders, and chest. ? Both of your arms, under your arms, and your hands. ? Your stomach and groin area, avoiding the genitals. ? Your right leg and foot. ? Your left leg and foot. ? The back of your neck, your back, and your buttocks. 2. Do not rinse. Discard the cloth and let the area air-dry. Do not put any substances on your body afterward-such as powder, lotion, or perfume-unless you are told to do so by your health care provider. Only use lotions that are recommended by the manufacturer. 3. Put on clean clothes or pajamas. Contact a health care provider if:  Your skin gets irritated after scrubbing.  You have questions about using your solution or cloth. Get help right away if:  Your eyes become very red or swollen.  Your eyes itch badly.  Your skin itches badly and is red or swollen.  Your  hearing changes.  You have trouble seeing.  You have swelling or tingling in your mouth or throat.  You have trouble breathing.  You swallow any chlorhexidine. Summary  Chlorhexidine gluconate (CHG) is a germ-killing (antiseptic) solution that is used to clean the skin. Cleaning your skin with CHG may help to lower your risk for infection.  You may be given CHG to use for bathing. It may be in a bottle or in a prepackaged cloth to use on your skin. Carefully follow your health care provider's instructions and the instructions on the product label.  Do not use CHG if you have a chlorhexidine allergy.  Contact your health care provider if your skin gets irritated after scrubbing. This information is not intended to replace advice given to you by your health care provider. Make sure you discuss any questions you have with your health care provider. Document Released: 12/01/2011 Document Revised: 05/25/2018 Document Reviewed: 02/03/2017 Elsevier Patient Education  2020 [employer].

## 2019-02-14 ENCOUNTER — Other Ambulatory Visit: Payer: Self-pay | Admitting: Cardiology

## 2019-02-19 ENCOUNTER — Other Ambulatory Visit: Payer: Self-pay

## 2019-02-19 DIAGNOSIS — C61 Malignant neoplasm of prostate: Secondary | ICD-10-CM | POA: Diagnosis not present

## 2019-02-19 DIAGNOSIS — I10 Essential (primary) hypertension: Secondary | ICD-10-CM

## 2019-02-19 MED ORDER — LOSARTAN POTASSIUM-HCTZ 50-12.5 MG PO TABS: 1.0000 | ORAL_TABLET | Freq: Every day | ORAL | 1 refills | Status: DC

## 2019-02-20 ENCOUNTER — Other Ambulatory Visit (HOSPITAL_COMMUNITY): Payer: BC Managed Care – PPO

## 2019-02-20 ENCOUNTER — Encounter (HOSPITAL_COMMUNITY)
Admission: RE | Admit: 2019-02-20 | Discharge: 2019-02-20 | Disposition: A | Discharge status: Home / Self Care | Payer: BC Managed Care – PPO | Source: Ambulatory Visit | Attending: Orthopedic Surgery | Admitting: Orthopedic Surgery

## 2019-02-20 ENCOUNTER — Encounter (HOSPITAL_COMMUNITY): Payer: Self-pay

## 2019-02-20 ENCOUNTER — Other Ambulatory Visit: Payer: Self-pay

## 2019-02-20 ENCOUNTER — Other Ambulatory Visit (HOSPITAL_COMMUNITY)
Admission: RE | Admit: 2019-02-20 | Discharge: 2019-02-20 | Disposition: A | Discharge status: Home / Self Care | Payer: BC Managed Care – PPO | Source: Ambulatory Visit | Attending: Orthopedic Surgery | Admitting: Orthopedic Surgery

## 2019-02-20 DIAGNOSIS — Z01812 Encounter for preprocedural laboratory examination: Secondary | ICD-10-CM | POA: Diagnosis not present

## 2019-02-20 LAB — CBC WITH DIFFERENTIAL/PLATELET
Abs Immature Granulocytes: 0.04 10*3/uL (ref 0.00–0.07)
Basophils Absolute: 0 10*3/uL (ref 0.0–0.1)
Basophils Relative: 0 %
Eosinophils Absolute: 0.2 10*3/uL (ref 0.0–0.5)
Eosinophils Relative: 4 %
HCT: 39.3 % (ref 39.0–52.0)
Hemoglobin: 13.7 g/dL (ref 13.0–17.0)
Immature Granulocytes: 1 %
Lymphocytes Relative: 24 %
Lymphs Abs: 1.1 10*3/uL (ref 0.7–4.0)
MCH: 31.2 pg (ref 26.0–34.0)
MCHC: 34.9 g/dL (ref 30.0–36.0)
MCV: 89.5 fL (ref 80.0–100.0)
Monocytes Absolute: 0.4 10*3/uL (ref 0.1–1.0)
Monocytes Relative: 8 %
Neutro Abs: 2.8 10*3/uL (ref 1.7–7.7)
Neutrophils Relative %: 63 %
Platelets: 144 10*3/uL — ABNORMAL LOW (ref 150–400)
RBC: 4.39 MIL/uL (ref 4.22–5.81)
RDW: 12.1 % (ref 11.5–15.5)
WBC: 4.5 10*3/uL (ref 4.0–10.5)
nRBC: 0 % (ref 0.0–0.2)

## 2019-02-20 LAB — BASIC METABOLIC PANEL
Anion gap: 8 (ref 5–15)
BUN: 20 mg/dL (ref 8–23)
CO2: 25 mmol/L (ref 22–32)
Calcium: 8.9 mg/dL (ref 8.9–10.3)
Chloride: 105 mmol/L (ref 98–111)
Creatinine, Ser: 1 mg/dL (ref 0.61–1.24)
GFR calc Af Amer: >60 mL/min (ref >60)
GFR calc non Af Amer: >60 mL/min (ref >60)
Glucose, Bld: 208 mg/dL — ABNORMAL HIGH (ref 70–99)
Potassium: 4.1 mmol/L (ref 3.5–5.1)
Sodium: 138 mmol/L (ref 135–145)

## 2019-02-20 LAB — SARS CORONAVIRUS 2 (TAT 6-24 HRS): SARS Coronavirus 2: NEGATIVE

## 2019-02-20 LAB — HEMOGLOBIN A1C
Hgb A1c MFr Bld: 7.6 % — ABNORMAL HIGH (ref 4.8–5.6)
Mean Plasma Glucose: 171.42 mg/dL

## 2019-02-20 LAB — GLUCOSE, CAPILLARY: Glucose-Capillary: 190 mg/dL — ABNORMAL HIGH (ref 70–99)

## 2019-02-21 NOTE — H&P (Signed)
[name]   11/22/2018   HISTORY SECTION :       Chief Complaint  Patient presents with  . Hand Pain      Right hand CTS.    HPI The patient presents for evaluation of pain and paresthesias in the right upper extremity.  He is a 63 year old male diabetic undergoing treatment for prostate cancer presents with 2 to 3-year history of pain paresthesias nocturnal symptoms waking up at night right upper extremity with numbness tingling right arm from the elbow down but mainly right hand.  He did wear brace for 2 years he reports no weakness     Review of Systems  All other systems reviewed and are negative.   Cancer treatments at [facility] on [facility]         Past Medical History:  Diagnosis Date  . Arthritis    . Diabetes mellitus without complication ([place]) [id number]    Diabetes Type II  . HTN (hypertension) 10/27/2018  . Hyperlipidemia    . Internal hemorrhoids 2014    noted on colonoscopy  . Osteoarthritis of both knees    . Pre-diabetes      questionable  . Prostate cancer ([place])    . Sigmoid diverticulosis 2014    Mild, noted on colonoscopy           Past Surgical History:  Procedure Laterality Date  . COLONOSCOPY N/A 09/13/2012    Procedure: COLONOSCOPY;  Surgeon: Danie Binder, MD;  Location: [facility];  Service: Endoscopy;  Laterality: N/A;  9:30 AM  . HERNIA REPAIR        20 years ago  . KNEE ARTHROSCOPY Left      15 years ago  . LYMPHADENECTOMY Bilateral 05/30/2017    Procedure: LYMPHADENECTOMY, PELVIC EXTENDED;  Surgeon: [name], MD;  Location: [facility] ORS;  Service: Urology;  Laterality: Bilateral;  . ROBOT ASSISTED LAPAROSCOPIC RADICAL PROSTATECTOMY N/A 05/30/2017    Procedure: XI ROBOTIC ASSISTED LAPAROSCOPIC RADICAL PROSTATECTOMY LEVEL 3;  Surgeon: [name], MD;  Location: [facility] ORS;  Service: Urology;  Laterality: N/A;  . SHOULDER ARTHROSCOPY Right      15 years ago        No Known Allergies     Current Outpatient Medications:   .  escitalopram (LEXAPRO) 5 MG tablet, Take 5 mg by mouth daily., Disp: , Rfl:  .  fenofibrate 160 MG tablet, Take 160 mg by mouth daily., Disp: , Rfl:  .  glipiZIDE (GLUCOTROL XL) 2.5 MG 24 hr tablet, Take 1 mg by mouth daily with breakfast., Disp: , Rfl:  .  rosuvastatin (CRESTOR) 20 MG tablet, Take 1 tablet (20 mg total) by mouth daily., Disp: 90 tablet, Rfl: 1 .  temazepam (RESTORIL) 15 MG capsule, Take 15 mg by mouth at bedtime as needed for sleep., Disp: , Rfl:  .  acetaminophen (TYLENOL) 325 MG tablet, Take 650 mg by mouth every 6 (six) hours as needed., Disp: , Rfl:  .  ibuprofen (ADVIL,MOTRIN) 200 MG tablet, Take 200 mg by mouth every 6 (six) hours as needed., Disp: , Rfl:  .  Icosapent Ethyl (VASCEPA) 1 g CAPS, Take 2 capsules (2 g total) by mouth 2 (two) times daily. (Patient not taking: Reported on 11/22/2018), Disp: 60 capsule, Rfl: 1 .  leuprolide (LUPRON) 3.75 MG injection, Inject into the muscle once. Unsure of dose, Disp: , Rfl:  .  metFORMIN (GLUCOPHAGE-XR) 500 MG 24 hr tablet, Take 500 mg by mouth 3 (  three) times daily with meals., Disp: , Rfl:      PHYSICAL EXAM SECTION: 1) BP 131/70   Pulse 67   Ht 5\' 9"  (1.753 m)   Wt 187 lb (84.8 kg)   BMI 27.62 kg/m   Body mass index is 27.62 kg/m. General appearance: Well-developed well-nourished no gross deformities  2) Cardiovascular normal pulse and perfusion in the upper extremities normal color without edema  3) Neurologically deep tendon reflexes are equal and normal, no sensation loss or deficits no pathologic reflexes   4) Psychological: Awake alert and oriented x3 mood and affect normal   5) Skin no lacerations or ulcerations no nodularity no palpable masses, no erythema or nodularity   6) Musculoskeletal:    Left hand alignment is normal range of motion is normal there is no instability and grip strength is normal   Right hand alignment is normal range of motion is normal there is no instability grip strength is  normal     MEDICAL DECISION SECTION:      Encounter Diagnosis  Name Primary?  . Carpal tunnel syndrome, right upper limb Yes      Imaging None  Impression: The above electrodiagnostic study is ABNORMAL and reveals evidence of:   1. A moderate to severe right median nerve entrapment at the wrist (carpal tunnel syndrome) affecting sensory and motor components.    2. A moderate left median nerve entrapment at the wrist (carpal tunnel syndrome) affecting sensory and motor components.    There is no significant electrodiagnostic evidence of any other focal nerve entrapment, brachial plexopathy or generalized peripheral neuropathy.    Plan:  Open right carpal tunnel release

## 2019-02-22 ENCOUNTER — Ambulatory Visit (HOSPITAL_COMMUNITY): Payer: BC Managed Care – PPO | Admitting: Anesthesiology

## 2019-02-22 ENCOUNTER — Other Ambulatory Visit: Payer: Self-pay | Admitting: Radiology

## 2019-02-22 ENCOUNTER — Ambulatory Visit (HOSPITAL_COMMUNITY)
Admission: RE | Admit: 2019-02-22 | Discharge: 2019-02-22 | Disposition: A | Discharge status: Home / Self Care | Payer: BC Managed Care – PPO | Attending: Orthopedic Surgery | Admitting: Orthopedic Surgery

## 2019-02-22 ENCOUNTER — Encounter (HOSPITAL_COMMUNITY): Payer: Self-pay | Admitting: *Deleted

## 2019-02-22 ENCOUNTER — Encounter (HOSPITAL_COMMUNITY)
Admission: RE | Disposition: A | Discharge status: Home / Self Care | Payer: Self-pay | Source: Home / Self Care | Attending: Orthopedic Surgery

## 2019-02-22 ENCOUNTER — Other Ambulatory Visit: Payer: Self-pay | Admitting: Cardiology

## 2019-02-22 DIAGNOSIS — Z9079 Acquired absence of other genital organ(s): Secondary | ICD-10-CM | POA: Insufficient documentation

## 2019-02-22 DIAGNOSIS — C61 Malignant neoplasm of prostate: Secondary | ICD-10-CM | POA: Diagnosis not present

## 2019-02-22 DIAGNOSIS — E119 Type 2 diabetes mellitus without complications: Secondary | ICD-10-CM | POA: Insufficient documentation

## 2019-02-22 DIAGNOSIS — G5601 Carpal tunnel syndrome, right upper limb: Secondary | ICD-10-CM | POA: Diagnosis not present

## 2019-02-22 DIAGNOSIS — Z79899 Other long term (current) drug therapy: Secondary | ICD-10-CM | POA: Insufficient documentation

## 2019-02-22 DIAGNOSIS — I10 Essential (primary) hypertension: Secondary | ICD-10-CM | POA: Diagnosis not present

## 2019-02-22 DIAGNOSIS — E785 Hyperlipidemia, unspecified: Secondary | ICD-10-CM | POA: Diagnosis not present

## 2019-02-22 DIAGNOSIS — M170 Bilateral primary osteoarthritis of knee: Secondary | ICD-10-CM | POA: Insufficient documentation

## 2019-02-22 DIAGNOSIS — Z7984 Long term (current) use of oral hypoglycemic drugs: Secondary | ICD-10-CM | POA: Diagnosis not present

## 2019-02-22 HISTORY — PX: CARPAL TUNNEL RELEASE: SHX101

## 2019-02-22 LAB — GLUCOSE, CAPILLARY
Glucose-Capillary: 208 mg/dL — ABNORMAL HIGH (ref 70–99)
Glucose-Capillary: 194 mg/dL — ABNORMAL HIGH (ref 70–99)

## 2019-02-22 SURGERY — CARPAL TUNNEL RELEASE
Anesthesia: Monitor Anesthesia Care | Site: Hand | Laterality: Right

## 2019-02-22 MED ORDER — MIDAZOLAM HCL 2 MG/2ML IJ SOLN
INTRAMUSCULAR | Status: AC
  Filled 2019-02-22: qty 2

## 2019-02-22 MED ORDER — FENTANYL CITRATE (PF) 100 MCG/2ML IJ SOLN
INTRAMUSCULAR | Status: AC
  Filled 2019-02-22: qty 2

## 2019-02-22 MED ORDER — PROPOFOL 10 MG/ML IV BOLUS
INTRAVENOUS | Status: DC | PRN
  Administered 2019-02-22: 20 mg via INTRAVENOUS

## 2019-02-22 MED ORDER — ACETAMINOPHEN-CODEINE #3 300-30 MG PO TABS
1.0000 | ORAL_TABLET | Freq: Once | ORAL | Status: AC
  Administered 2019-02-22: 1 via ORAL
  Filled 2019-02-22: qty 1

## 2019-02-22 MED ORDER — MIDAZOLAM HCL 5 MG/5ML IJ SOLN
INTRAMUSCULAR | Status: DC | PRN
  Administered 2019-02-22: 2 mg via INTRAVENOUS

## 2019-02-22 MED ORDER — PROMETHAZINE HCL 25 MG/ML IJ SOLN: 6.2500 mg | INTRAMUSCULAR | Status: DC | PRN

## 2019-02-22 MED ORDER — MIDAZOLAM HCL 2 MG/2ML IJ SOLN: 0.5000 mg | Freq: Once | INTRAMUSCULAR | Status: DC | PRN

## 2019-02-22 MED ORDER — LIDOCAINE HCL (PF) 0.5 % IJ SOLN
INTRAMUSCULAR | Status: DC | PRN
  Administered 2019-02-22: 50 mL via INTRAVENOUS

## 2019-02-22 MED ORDER — FENTANYL CITRATE (PF) 100 MCG/2ML IJ SOLN
INTRAMUSCULAR | Status: DC | PRN
  Administered 2019-02-22: 50 ug via INTRAVENOUS

## 2019-02-22 MED ORDER — PROPOFOL 500 MG/50ML IV EMUL
INTRAVENOUS | Status: DC | PRN
  Administered 2019-02-22: 25 ug/kg/min via INTRAVENOUS

## 2019-02-22 MED ORDER — CEFAZOLIN SODIUM-DEXTROSE 2-4 GM/100ML-% IV SOLN
2.0000 g | INTRAVENOUS | Status: AC
  Administered 2019-02-22: 2 g via INTRAVENOUS
  Filled 2019-02-22: qty 100

## 2019-02-22 MED ORDER — ACETAMINOPHEN-CODEINE #3 300-30 MG PO TABS: 1.0000 | ORAL_TABLET | Freq: Four times a day (QID) | ORAL | 0 refills | Status: DC | PRN

## 2019-02-22 MED ORDER — CHLORHEXIDINE GLUCONATE 4 % EX LIQD: 60.0000 mL | Freq: Once | CUTANEOUS | Status: DC

## 2019-02-22 MED ORDER — 0.9 % SODIUM CHLORIDE (POUR BTL) OPTIME
TOPICAL | Status: DC | PRN
  Administered 2019-02-22: 1000 mL

## 2019-02-22 MED ORDER — LACTATED RINGERS IV SOLN
INTRAVENOUS | Status: DC
  Administered 2019-02-22: 1000 mL via INTRAVENOUS

## 2019-02-22 MED ORDER — HYDROMORPHONE HCL 1 MG/ML IJ SOLN: 0.2500 mg | INTRAMUSCULAR | Status: DC | PRN

## 2019-02-22 MED ORDER — BUPIVACAINE HCL (PF) 0.5 % IJ SOLN
INTRAMUSCULAR | Status: AC
  Filled 2019-02-22: qty 30

## 2019-02-22 MED ORDER — BUPIVACAINE HCL (PF) 0.5 % IJ SOLN
INTRAMUSCULAR | Status: DC | PRN
  Administered 2019-02-22: 10 mL

## 2019-02-22 MED ORDER — HYDROCODONE-ACETAMINOPHEN 7.5-325 MG PO TABS: 1.0000 | ORAL_TABLET | Freq: Once | ORAL | Status: DC | PRN

## 2019-02-22 SURGICAL SUPPLY — 42 items
APL PRP STRL LF DISP 70% ISPRP (MISCELLANEOUS) ×1
BANDAGE ESMARK 4X12 BL STRL LF (DISPOSABLE) ×1 IMPLANT
BLADE SURG 15 STRL LF DISP TIS (BLADE) ×1 IMPLANT
BLADE SURG 15 STRL SS (BLADE) ×2
BNDG CMPR 12X4 ELC STRL LF (DISPOSABLE) ×1
BNDG CMPR STD VLCR NS LF 5.8X3 (GAUZE/BANDAGES/DRESSINGS) ×1
BNDG COHESIVE 4X5 TAN STRL (GAUZE/BANDAGES/DRESSINGS) ×2 IMPLANT
BNDG ELASTIC 3X5.8 VLCR NS LF (GAUZE/BANDAGES/DRESSINGS) ×2 IMPLANT
BNDG ESMARK 4X12 BLUE STRL LF (DISPOSABLE) ×2
BNDG GAUZE ELAST 4 BULKY (GAUZE/BANDAGES/DRESSINGS) ×2 IMPLANT
CHLORAPREP W/TINT 26 (MISCELLANEOUS) ×2 IMPLANT
CLOTH BEACON ORANGE TIMEOUT ST (SAFETY) ×2 IMPLANT
COVER LIGHT HANDLE STERIS (MISCELLANEOUS) ×4 IMPLANT
COVER WAND RF STERILE (DRAPES) ×2 IMPLANT
CUFF TOURN SGL QUICK 18X4 (TOURNIQUET CUFF) ×2 IMPLANT
DECANTER SPIKE VIAL GLASS SM (MISCELLANEOUS) ×2 IMPLANT
DRAPE HALF SHEET 40X57 (DRAPES) ×2 IMPLANT
DRSG XEROFORM 1X8 (GAUZE/BANDAGES/DRESSINGS) ×2 IMPLANT
ELECT NEEDLE TIP 2.8 STRL (NEEDLE) ×2 IMPLANT
ELECT REM PT RETURN 9FT ADLT (ELECTROSURGICAL) ×2
ELECTRODE REM PT RTRN 9FT ADLT (ELECTROSURGICAL) ×1 IMPLANT
GAUZE PETROLATUM 1 X8 (GAUZE/BANDAGES/DRESSINGS) ×2 IMPLANT
GAUZE SPONGE 4X4 12PLY STRL (GAUZE/BANDAGES/DRESSINGS) ×2 IMPLANT
GLOVE BIO SURGEON STRL SZ7 (GLOVE) ×2 IMPLANT
GLOVE BIOGEL PI IND STRL 7.0 (GLOVE) ×2 IMPLANT
GLOVE BIOGEL PI INDICATOR 7.0 (GLOVE) ×2
GLOVE SKINSENSE NS SZ8.0 LF (GLOVE) ×1
GLOVE SKINSENSE STRL SZ8.0 LF (GLOVE) ×1 IMPLANT
GLOVE SS N UNI LF 8.5 STRL (GLOVE) ×2 IMPLANT
GOWN STRL REUS W/ TWL LRG LVL3 (GOWN DISPOSABLE) ×1 IMPLANT
GOWN STRL REUS W/TWL LRG LVL3 (GOWN DISPOSABLE) ×2
GOWN STRL REUS W/TWL XL LVL3 (GOWN DISPOSABLE) ×2 IMPLANT
KIT TURNOVER KIT A (KITS) ×2 IMPLANT
MANIFOLD NEPTUNE II (INSTRUMENTS) ×2 IMPLANT
NEEDLE HYPO 21X1.5 SAFETY (NEEDLE) ×2 IMPLANT
NS IRRIG 1000ML POUR BTL (IV SOLUTION) ×2 IMPLANT
PACK BASIC LIMB (CUSTOM PROCEDURE TRAY) ×2 IMPLANT
PAD ARMBOARD 7.5X6 YLW CONV (MISCELLANEOUS) ×2 IMPLANT
POSITIONER HAND ALUMI XLG (MISCELLANEOUS) ×2 IMPLANT
SET BASIN LINEN APH (SET/KITS/TRAYS/PACK) ×2 IMPLANT
SUT ETHILON 3 0 FSL (SUTURE) ×2 IMPLANT
SYR CONTROL 10ML LL (SYRINGE) ×2 IMPLANT

## 2019-02-22 NOTE — Brief Op Note (Signed)
02/22/2019  10:44 AM  PATIENT:  [name]  63 y.o. male  PRE-OPERATIVE DIAGNOSIS:  right carpal tunnel syndrome  POST-OPERATIVE DIAGNOSIS:  right carpal tunnel syndrome  PROCEDURE:  Procedure(s): CARPAL TUNNEL RELEASE (Right)  SURGEON:  Surgeon(s) and Role:    Carole Civil, MD - Primary  Date 02/22/2019   PRE-OPERATIVE DIAGNOSIS:  RIGHT  CARPAL TUNNEL SYNDROME  POST-OPERATIVE DIAGNOSIS:  RIGHT CARPAL TUNNEL SYNDROME  PROCEDURE:  Procedure(s): RIGHT CARPAL TUNNEL RELEASE RIGHT   SURGEON:  Surgeon(s) and Role:    * Carole Civil, MD - Primary  PHYSICIAN ASSISTANT:   ASSISTANTS: none   ANESTHESIA:   regional  BLOOD ADMINISTERED:none  DRAINS: none   LOCAL MEDICATIONS USED:  MARCAINE     SPECIMEN:  No Specimen  DISPOSITION OF SPECIMEN:  N/A  COUNTS:  YES  TOURNIQUET:  * Missing tourniquet times found for documented tourniquets in log:  [id number] *  DICTATION: .Viviann Spare Dictation   Surgeon Aline Brochure  Operative findings compression of the RIGHT MEDIAN NERVE COMPRESSION   Indications failure of conservative treatment to relieve pain and paresthesias and numbness and tingling of the RIGHT HAND   The patient was identified in the preop area we confirm the surgical site marked as right wrist. Chart update completed. Patient taken to surgery.  Antibiotic ancef  after establishing a successful anesthesia the arm was prepped with ChloraPrep  Timeout executed completed and confirmed site. RIGHT WRIST  /  HAND   A straight incision was made over the RIGHT carpal tunnel in line with the radial border of the ring finger. Blunt dissection was carried out to find the distal aspect of the carpal tunnel. A blunted judgment was passed beneath the carpal tunnel. Sharp incision was then used to release the transverse carpal ligament. The contents of the carpal tunnel were inspected. The median nerve was compressed with slight discoloration.  The wound was irrigated  and then closed with 3-0 nylon suture. We injected 10 mL of plain Marcaine on the radial side of the incision  A sterile bandage was applied and the tourniquet was released the color of the hand and capillary refill were normal  The patient was taken to the recovery room in stable condition  PLAN OF CARE: Discharge to home after PACU  PATIENT DISPOSITION:  PACU - hemodynamically stable.   Delay start of Pharmacological VTE agent (>24hrs) due to surgical blood loss or risk of bleeding: not applicablePHYSICIAN ASSISTANT:

## 2019-02-22 NOTE — Op Note (Signed)
02/22/2019  10:44 AM  PATIENT:  [name]  63 y.o. male  PRE-OPERATIVE DIAGNOSIS:  right carpal tunnel syndrome  POST-OPERATIVE DIAGNOSIS:  right carpal tunnel syndrome  PROCEDURE:  Procedure(s): CARPAL TUNNEL RELEASE (Right)  SURGEON:  Surgeon(s) and Role:    Carole Civil, MD - Primary  Date 02/22/2019   PRE-OPERATIVE DIAGNOSIS:  RIGHT  CARPAL TUNNEL SYNDROME  POST-OPERATIVE DIAGNOSIS:  RIGHT CARPAL TUNNEL SYNDROME  PROCEDURE:  Procedure(s): RIGHT CARPAL TUNNEL RELEASE RIGHT   SURGEON:  Surgeon(s) and Role:    * Carole Civil, MD - Primary  PHYSICIAN ASSISTANT:   ASSISTANTS: none   ANESTHESIA:   regional  BLOOD ADMINISTERED:none  DRAINS: none   LOCAL MEDICATIONS USED:  MARCAINE     SPECIMEN:  No Specimen  DISPOSITION OF SPECIMEN:  N/A  COUNTS:  YES  TOURNIQUET:  * Missing tourniquet times found for documented tourniquets in log:  [id number] *  DICTATION: .Viviann Spare Dictation   Surgeon Aline Brochure  Operative findings compression of the RIGHT MEDIAN NERVE COMPRESSION   Indications failure of conservative treatment to relieve pain and paresthesias and numbness and tingling of the RIGHT HAND   The patient was identified in the preop area we confirm the surgical site marked as right wrist. Chart update completed. Patient taken to surgery.  Antibiotic ancef  after establishing a successful anesthesia the arm was prepped with ChloraPrep  Timeout executed completed and confirmed site. RIGHT WRIST  /  HAND   A straight incision was made over the RIGHT carpal tunnel in line with the radial border of the ring finger. Blunt dissection was carried out to find the distal aspect of the carpal tunnel. A blunted judgment was passed beneath the carpal tunnel. Sharp incision was then used to release the transverse carpal ligament. The contents of the carpal tunnel were inspected. The median nerve was compressed with slight discoloration.  The wound was irrigated  and then closed with 3-0 nylon suture. We injected 10 mL of plain Marcaine on the radial side of the incision  A sterile bandage was applied and the tourniquet was released the color of the hand and capillary refill were normal  The patient was taken to the recovery room in stable condition  PLAN OF CARE: Discharge to home after PACU  PATIENT DISPOSITION:  PACU - hemodynamically stable.   Delay start of Pharmacological VTE agent (>24hrs) due to surgical blood loss or risk of bleeding: not applicable

## 2019-02-22 NOTE — Anesthesia Postprocedure Evaluation (Signed)
Anesthesia Post Note  Patient: [name]  Procedure(s) Performed: CARPAL TUNNEL RELEASE (Right Hand)  Patient location during evaluation: PACU Anesthesia Type: MAC Level of consciousness: awake and alert and oriented Pain management: pain level controlled Vital Signs Assessment: post-procedure vital signs reviewed and stable Respiratory status: spontaneous breathing Cardiovascular status: blood pressure returned to baseline and stable Postop Assessment: no apparent nausea or vomiting Anesthetic complications: no     Last Vitals:  Vitals:   02/22/19 1100 02/22/19 1115  BP: 124/66   Pulse: 64 61  Resp: 14 14  Temp:    SpO2: 100% 100%    Last Pain:  Vitals:   02/22/19 1115  TempSrc:   PainSc: 0-No pain                 [name]

## 2019-02-22 NOTE — Telephone Encounter (Signed)
Completed a prior authorization for Tylenol with Codeine post operative.

## 2019-02-22 NOTE — Anesthesia Preprocedure Evaluation (Signed)
Anesthesia Evaluation  Patient identified by MRN, date of birth, ID band Patient awake    Reviewed: Allergy & Precautions, NPO status , Patient's Chart, lab work & pertinent test results  Airway Mallampati: III  TM Distance: >3 FB Neck ROM: Full    Dental no notable dental hx. (+) Teeth Intact   Pulmonary neg pulmonary ROS,    Pulmonary exam normal breath sounds clear to auscultation       Cardiovascular Exercise Tolerance: Good hypertension, Pt. on medications negative cardio ROS Normal cardiovascular examI Rhythm:Regular Rate:Normal     Neuro/Psych negative neurological ROS  negative psych ROS   GI/Hepatic negative GI ROS, Neg liver ROS,   Endo/Other  negative endocrine ROSdiabetes, Type 2, Oral Hypoglycemic Agents  Renal/GU negative Renal ROS  negative genitourinary   Musculoskeletal negative musculoskeletal ROS (+)   Abdominal   Peds negative pediatric ROS (+)  Hematology negative hematology ROS (+)   Anesthesia Other Findings H/o Prostate Ca treated surgically   Reproductive/Obstetrics negative OB ROS                             Anesthesia Physical Anesthesia Plan  ASA: II  Anesthesia Plan: Bier Block and MAC and Bier Block-LIDOCAINE ONLY   Post-op Pain Management:    Induction: Intravenous  PONV Risk Score and Plan: Propofol infusion, TIVA, Treatment may vary due to age or medical condition and Midazolam  Airway Management Planned: Nasal Cannula and Simple Face Mask  Additional Equipment:   Intra-op Plan:   Post-operative Plan:   Informed Consent: I have reviewed the patients History and Physical, chart, labs and discussed the procedure including the risks, benefits and alternatives for the proposed anesthesia with the patient or authorized representative who has indicated his/her understanding and acceptance.     Dental advisory given  Plan Discussed with:  CRNA  Anesthesia Plan Comments: (Plan Full PPE use  Plan MAC/BIER block  with GA (LMA) vs GETA as needed d/w pt -WTP with same after Q&A)        Anesthesia Quick Evaluation

## 2019-02-22 NOTE — Interval H&P Note (Signed)
History and Physical Interval Note:  02/22/2019 9:38 AM  [name]  has presented today for surgery, with the diagnosis of right carpal tunnel syndrome.  The various methods of treatment have been discussed with the patient and family. After consideration of risks, benefits and other options for treatment, the patient has consented to  Procedure(s): CARPAL TUNNEL RELEASE (Right) as a surgical intervention.  The patient's history has been reviewed, patient examined, no change in status, stable for surgery.  I have reviewed the patient's chart and labs.  Questions were answered to the patient's satisfaction.     [name]

## 2019-02-22 NOTE — Telephone Encounter (Signed)
Prior authorization was denied, [employer] will not cover I have called pharmacy to see if they can send it through for 28 tablets instead of 30. [facility] did not pick up can you resend for 28?

## 2019-02-22 NOTE — Discharge Instructions (Signed)
Monitored Anesthesia Care, Care After °These instructions provide you with information about caring for yourself after your procedure. Your health care provider may also give you more specific instructions. Your treatment has been planned according to current medical practices, but problems sometimes occur. Call your health care provider if you have any problems or questions after your procedure. °What can I expect after the procedure? °After your procedure, you may: °· Feel sleepy for several hours. °· Feel clumsy and have poor balance for several hours. °· Feel forgetful about what happened after the procedure. °· Have poor judgment for several hours. °· Feel nauseous or vomit. °· Have a sore throat if you had a breathing tube during the procedure. °Follow these instructions at home: °For at least 24 hours after the procedure: ° °  ° °· Have a responsible adult stay with you. It is important to have someone help care for you until you are awake and alert. °· Rest as needed. °· Do not: °? Participate in activities in which you could fall or become injured. °? Drive. °? Use heavy machinery. °? Drink alcohol. °? Take sleeping pills or medicines that cause drowsiness. °? Make important decisions or sign legal documents. °? Take care of children on your own. °Eating and drinking °· Follow the diet that is recommended by your health care provider. °· If you vomit, drink water, juice, or soup when you can drink without vomiting. °· Make sure you have little or no nausea before eating solid foods. °General instructions °· Take over-the-counter and prescription medicines only as told by your health care provider. °· If you have sleep apnea, surgery and certain medicines can increase your risk for breathing problems. Follow instructions from your health care provider about wearing your sleep device: °? Anytime you are sleeping, including during daytime naps. °? While taking prescription pain medicines, sleeping medicines,  or medicines that make you drowsy. °· If you smoke, do not smoke without supervision. °· Keep all follow-up visits as told by your health care provider. This is important. °Contact a health care provider if: °· You keep feeling nauseous or you keep vomiting. °· You feel light-headed. °· You develop a rash. °· You have a fever. °Get help right away if: °· You have trouble breathing. °Summary °· For several hours after your procedure, you may feel sleepy and have poor judgment. °· Have a responsible adult stay with you for at least 24 hours or until you are awake and alert. °This information is not intended to replace advice given to you by your health care provider. Make sure you discuss any questions you have with your health care provider. °Document Released: 06/29/2015 Document Revised: 06/06/2017 Document Reviewed: 06/29/2015 °Elsevier Patient Education © 2020 [employer] ° °

## 2019-02-22 NOTE — Addendum Note (Signed)
Addended byCandice Camp on: 02/22/2019 12:29 PM   Modules accepted: Orders

## 2019-02-22 NOTE — Transfer of Care (Signed)
Immediate Anesthesia Transfer of Care Note  Patient: [name]  Procedure(s) Performed: CARPAL TUNNEL RELEASE (Right Hand)  Patient Location: PACU  Anesthesia Type:MAC and Bier block  Level of Consciousness: awake  Airway & Oxygen Therapy: Patient Spontanous Breathing  Post-op Assessment: Report given to RN  Post vital signs: Reviewed and stable  Last Vitals:  Vitals Value Taken Time  BP 132/76 02/22/19 1048  Temp    Pulse    Resp 12 02/22/19 1050  SpO2    Vitals shown include unvalidated device data.  Last Pain:  Vitals:   02/22/19 0910  TempSrc: Oral  PainSc: 0-No pain      Patients Stated Pain Goal: 8 (11/03/46 1856)  Complications: No apparent anesthesia complications

## 2019-02-22 NOTE — Anesthesia Procedure Notes (Signed)
Anesthesia Regional Block: Bier block (IV Regional)   Pre-Anesthetic Checklist: ,, timeout performed, Correct Patient, Correct Site, Correct Laterality, Correct Procedure,, site marked, surgical consent,, at surgeon's request  Laterality: Right and Upper     Needles:  Injection technique: Single-shot  Needle Type: Other      Needle Gauge: 22     Additional Needles:   Procedures:,,,,, intact distal pulses, Esmarch exsanguination, single tourniquet utilized,   Nerve Stimulator or Paresthesia:   Additional Responses:  Pulse checked post tourniquet inflation. IV NSL discontinued post injection. Narrative:   Performed by: Personally

## 2019-02-23 ENCOUNTER — Telehealth: Payer: Self-pay | Admitting: Orthopedic Surgery

## 2019-02-23 ENCOUNTER — Telehealth: Payer: Self-pay | Admitting: Radiation Oncology

## 2019-02-23 ENCOUNTER — Encounter (HOSPITAL_COMMUNITY): Payer: Self-pay | Admitting: Orthopedic Surgery

## 2019-02-23 NOTE — Telephone Encounter (Signed)
This was done yesterday, was deinied

## 2019-02-23 NOTE — Telephone Encounter (Signed)
Received voicemail message from patient's wife requesting return call. Phoned her back. She explains that intermittently her husband's pelvis area below his belt line and lower extremities became "very swollen." She endorses him denying pain or other issues related to this. She explains his cardiologist is aware and believes it has to do with his lymph nodes. She explains Dr. Alinda Money is aware. Explained it is difficult to determine the cause of her husband's edema over the phone. Explained it could certainly be lymphedema since he has had an RALP with [facility] for T1c adenocarcinoma of the prostate with a Gleason's score of 4+5.  Encouraged her to have her husband follow up with Dr. Alinda Money sooner than his scheduled appointment of February for further evaluation and management. She verbalized understanding and expressed appreciation for the return call.

## 2019-02-23 NOTE — Telephone Encounter (Signed)
Per fax from [facility] [phone]) indicating prior authorization request has been started for medication:  Acetaminophen-Codeine #3 300-30MG  Tablets  "Key [id number]", requesting to complete the PA and click "Send to Plan" for approval.

## 2019-02-26 ENCOUNTER — Other Ambulatory Visit: Payer: Self-pay

## 2019-02-26 ENCOUNTER — Ambulatory Visit (INDEPENDENT_AMBULATORY_CARE_PROVIDER_SITE_OTHER): Payer: BC Managed Care – PPO | Admitting: Orthopaedic Surgery

## 2019-02-26 ENCOUNTER — Ambulatory Visit: Payer: Self-pay

## 2019-02-26 DIAGNOSIS — G8929 Other chronic pain: Secondary | ICD-10-CM | POA: Diagnosis not present

## 2019-02-26 DIAGNOSIS — M1712 Unilateral primary osteoarthritis, left knee: Secondary | ICD-10-CM

## 2019-02-26 DIAGNOSIS — M25562 Pain in left knee: Secondary | ICD-10-CM

## 2019-02-26 MED ORDER — ACETAMINOPHEN-CODEINE #3 300-30 MG PO TABS: 1.0000 | ORAL_TABLET | Freq: Four times a day (QID) | ORAL | 0 refills | Status: AC | PRN

## 2019-02-26 NOTE — Progress Notes (Signed)
Office Visit Note   Patient: [name]           Date of Birth: [birth year]           MRN: [id number] Visit Date: 02/26/2019              Requested by: [name], MD [address] [place],  [place] [address] PCP: [name], MD   Assessment & Plan: Visit Diagnoses:  1. Chronic pain of left knee   2. Unilateral primary osteoarthritis, left knee     Plan: I went over his x-rays with him in detail and talked about knee replacement surgery.  We discussed what his interoperative and postoperative course would involve.  We discussed the risk and benefits of surgery in great detail.  I showed him a knee model and we talked about therapy for his knee postoperative.  Given the fact that he has tried and failed all forms conservative treatment for over 3 years now and his knee pain is worsened to the point where it is detriment affecting his mobility, his quality of life and his actives daily living, he does wish proceed with knee replacement surgery and I do feel this is reasonable at this point given his x-ray findings and clinical exam findings.  All question concerns were answered and addressed.  We will work on getting this scheduled.  Follow-Up Instructions: Return for 2 weeks post-op.   Orders:  Orders Placed This Encounter  Procedures  . XR Knee 1-2 Views Left   No orders of the defined types were placed in this encounter.     Procedures: No procedures performed   Clinical Data: No additional findings.   Subjective: Chief Complaint  Patient presents with  . Left Knee - Pain  The patient is well-known to me.  He has severe end-stage arthritis involving his left knee and we seen him for over the years for this.  We were actually considering knee replacement surgery for his left knee as was he but then he unfortunately developed prostate cancer.  This did spread to his lymph nodes.  He has had treatment and prostate surgery.  At this point his left knee is  bothering him enough to once again consider knee replacement surgery.  At this point his left knee arthritis is detriment affecting his mobility, his quality of life, and his actives daily living to the point that he does wish to proceed with the surgery.  We have performed conservative treatment for his left knee for over 3 years now.  He does have known severe end-stage arthritis.  HPI  Review of Systems He currently denies any headache, chest pain, shortness of breath, fever, chills, nausea, vomiting  Objective: Vital Signs: There were no vitals taken for this visit.  Physical Exam He is alert and orient x3 and in no acute distress Ortho Exam Examination of his left knee shows neutral alignment.  He has medial lateral joint line tenderness as well as significant patellofemoral crepitation.  There is anterior and posterior knee pain.  The knee is ligamentously stable with good range of motion. Specialty Comments:  No specialty comments available.  Imaging: Xr Knee 1-2 Views Left  Result Date: 02/26/2019 2 views of the left knee show severe end-stage arthritis with complete loss of medial and lateral joint space.  There are large periarticular osteophytes in all 3 compartments consistent with severe osteoarthritis.    PMFS History: Patient Active Problem List   Diagnosis Date Noted  .  Carpal tunnel syndrome of right wrist   . HTN (hypertension) 10/27/2018  . Prostate cancer ([facility]) 05/30/2017  . Malignant neoplasm of prostate ([facility]) 04/28/2017  . Chronic pain of both knees 02/21/2017  . Unilateral primary osteoarthritis, left knee 02/21/2017  . Unilateral primary osteoarthritis, right knee 02/21/2017   Past Medical History:  Diagnosis Date  . Arthritis   . Diabetes mellitus without complication ([facility]) [id number]   Diabetes Type II  . HTN (hypertension) 10/27/2018  . Hyperlipidemia   . Internal hemorrhoids 2014   noted on colonoscopy  . Osteoarthritis of both knees   . Pre-diabetes     questionable  . Prostate cancer ([facility])   . Sigmoid diverticulosis 2014   Mild, noted on colonoscopy    Family History  Problem Relation Age of Onset  . Cancer Mother        small cell lung    Past Surgical History:  Procedure Laterality Date  . CARPAL TUNNEL RELEASE Right 02/22/2019   Procedure: CARPAL TUNNEL RELEASE;  Surgeon: Carole Civil, MD;  Location: [facility];  Service: Orthopedics;  Laterality: Right;  . COLONOSCOPY N/A 09/13/2012   Procedure: COLONOSCOPY;  Surgeon: Danie Binder, MD;  Location: [facility];  Service: Endoscopy;  Laterality: N/A;  9:30 AM  . HERNIA REPAIR     20 years ago  . KNEE ARTHROSCOPY Left    15 years ago  . LYMPHADENECTOMY Bilateral 05/30/2017   Procedure: LYMPHADENECTOMY, PELVIC EXTENDED;  Surgeon: [name], MD;  Location: [facility];  Service: Urology;  Laterality: Bilateral;  . ROBOT ASSISTED LAPAROSCOPIC RADICAL PROSTATECTOMY N/A 05/30/2017   Procedure: XI ROBOTIC ASSISTED LAPAROSCOPIC RADICAL PROSTATECTOMY LEVEL 3;  Surgeon: [name], MD;  Location: [facility];  Service: Urology;  Laterality: N/A;  . SHOULDER ARTHROSCOPY Right    15 years ago   Social History   Occupational History  . Occupation: pastor  Tobacco Use  . Smoking status: Never Smoker  . Smokeless tobacco: Never Used  Substance and Sexual Activity  . Alcohol use: No  . Drug use: No  . Sexual activity: Yes

## 2019-02-28 ENCOUNTER — Other Ambulatory Visit: Payer: Self-pay

## 2019-03-01 ENCOUNTER — Other Ambulatory Visit: Payer: Self-pay

## 2019-03-01 DIAGNOSIS — I10 Essential (primary) hypertension: Secondary | ICD-10-CM | POA: Diagnosis not present

## 2019-03-01 DIAGNOSIS — E119 Type 2 diabetes mellitus without complications: Secondary | ICD-10-CM | POA: Diagnosis not present

## 2019-03-01 DIAGNOSIS — E781 Pure hyperglyceridemia: Secondary | ICD-10-CM | POA: Diagnosis not present

## 2019-03-01 MED ORDER — VASCEPA 1 G PO CAPS: 2.0000 g | ORAL_CAPSULE | Freq: Two times a day (BID) | ORAL | 3 refills | Status: DC

## 2019-03-02 ENCOUNTER — Encounter: Payer: Self-pay | Admitting: Orthopedic Surgery

## 2019-03-02 ENCOUNTER — Other Ambulatory Visit: Payer: Self-pay

## 2019-03-02 ENCOUNTER — Ambulatory Visit (INDEPENDENT_AMBULATORY_CARE_PROVIDER_SITE_OTHER): Payer: BC Managed Care – PPO | Admitting: Orthopedic Surgery

## 2019-03-02 VITALS — BP 128/70 | HR 68

## 2019-03-02 DIAGNOSIS — Z9889 Other specified postprocedural states: Secondary | ICD-10-CM

## 2019-03-02 DIAGNOSIS — G5601 Carpal tunnel syndrome, right upper limb: Secondary | ICD-10-CM

## 2019-03-02 NOTE — Progress Notes (Signed)
Chief Complaint  Patient presents with  . Carpal Tunnel    R/feeling much better    Status post carpal tunnel release patient says he is doing well hand feels great  His suture line looks good I will see him next week to see if we get the sutures out if not we will do it on Monday  Encounter Diagnoses  Name Primary?  . Carpal tunnel syndrome, right upper limb Yes  . S/P carpal tunnel release, RIGHT HAND

## 2019-03-05 ENCOUNTER — Other Ambulatory Visit: Payer: Self-pay | Admitting: Physician Assistant

## 2019-03-08 DIAGNOSIS — Z9889 Other specified postprocedural states: Secondary | ICD-10-CM | POA: Insufficient documentation

## 2019-03-09 ENCOUNTER — Encounter: Payer: Self-pay | Admitting: Orthopedic Surgery

## 2019-03-09 ENCOUNTER — Ambulatory Visit (INDEPENDENT_AMBULATORY_CARE_PROVIDER_SITE_OTHER): Payer: BC Managed Care – PPO | Admitting: Orthopedic Surgery

## 2019-03-09 ENCOUNTER — Other Ambulatory Visit: Payer: Self-pay

## 2019-03-09 DIAGNOSIS — Z9889 Other specified postprocedural states: Secondary | ICD-10-CM

## 2019-03-09 NOTE — Progress Notes (Signed)
Postop visit number 1 status post right carpal tunnel release  We removed some of the sutures today.  We will remove the wrist on Tuesday  His wound looks okay his fingers are feeling better  Encounter Diagnosis  Name Primary?  . S/P carpal tunnel release right 02/22/19 Yes

## 2019-03-09 NOTE — Patient Instructions (Signed)
TUES AFTERNOON FOR SUTURE REMOVAL

## 2019-03-13 ENCOUNTER — Other Ambulatory Visit: Payer: Self-pay

## 2019-03-13 ENCOUNTER — Ambulatory Visit (INDEPENDENT_AMBULATORY_CARE_PROVIDER_SITE_OTHER): Payer: BC Managed Care – PPO | Admitting: Orthopedic Surgery

## 2019-03-13 VITALS — BP 126/75 | HR 70 | Temp 97.5°F | Wt 198.0 lb

## 2019-03-13 DIAGNOSIS — Z9889 Other specified postprocedural states: Secondary | ICD-10-CM

## 2019-03-13 NOTE — Patient Instructions (Addendum)
DUE TO COVID-19 ONLY ONE VISITOR IS ALLOWED TO COME WITH YOU AND STAY IN THE WAITING ROOM ONLY DURING PRE OP AND PROCEDURE DAY OF SURGERY. THE 1 VISITOR MAY VISIT WITH YOU AFTER SURGERY IN YOUR PRIVATE ROOM DURING VISITING HOURS ONLY!  YOU NEED TO HAVE A COVID 19 TEST ON:03/17/19  @ 10:45 AM        , THIS TEST MUST BE DONE BEFORE SURGERY, COME  [place], [place] , [id number].  ([place]) ONCE YOUR COVID TEST IS COMPLETED, PLEASE BEGIN THE QUARANTINE INSTRUCTIONS AS OUTLINED IN YOUR HANDOUT.                [name]   Your procedure is scheduled on: 03/21/2019   Report to [facility] Main  Entrance   Report to short stay at: 5:30 AM     Call this number if you have problems the morning of surgery [phone]    Remember:     [place], NO [place].     Take these medicines the morning of surgery with A SIP OF WATER: escitalopram,crestor   NO SOLID FOOD AFTER MIDNIGHT THE NIGHT PRIOR TO SURGERY. NOTHING BY MOUTH EXCEPT CLEAR LIQUIDS UNTIL: 4:00 AM . PLEASE FINISH GATORADE  DRINK PER SURGEON ORDER  WHICH NEEDS TO BE COMPLETED AT : 4:00 AM   CLEAR LIQUID DIET   Foods Allowed                                                                     Foods Excluded  Coffee and tea, regular and decaf                             liquids that you cannot  Plain Jell-O any favor except red or purple                                           see through such as: Fruit ices (not with fruit pulp)                                     milk, soups, orange juice  Iced Popsicles                                    All solid food Carbonated beverages, regular and diet                                    Cranberry, grape and apple juices Sports drinks like Gatorade Lightly seasoned clear broth or consume(fat free) Sugar, honey syrup  Sample Menu Breakfast  Lunch                                      Supper Cranberry juice                    Beef broth                            Chicken broth Jell-O                                     Grape juice                           Apple juice Coffee or tea                        Jell-O                                      Popsicle                                                Coffee or tea                        Coffee or tea  _____________________________________________________________________ How to Manage Your Diabetes Before and After Surgery  Why is it important to control my blood sugar before and after surgery? . Improving blood sugar levels before and after surgery helps healing and can limit problems. . A way of improving blood sugar control is eating a healthy diet by: o  Eating less sugar and carbohydrates o  Increasing activity/exercise o  Talking with your doctor about reaching your blood sugar goals . High blood sugars (greater than 180 mg/dL) can raise your risk of infections and slow your recovery, so you will need to focus on controlling your diabetes during the weeks before surgery. . Make sure that the doctor who takes care of your diabetes knows about your planned surgery including the date and location.  How do I manage my blood sugar before surgery? . Check your blood sugar at least 4 times a day, starting 2 days before surgery, to make sure that the level is not too high or low. o Check your blood sugar the morning of your surgery when you wake up and every 2 hours until you get to the Short Stay unit. . If your blood sugar is less than 70 mg/dL, you will need to treat for low blood sugar: o Do not take insulin. o Treat a low blood sugar (less than 70 mg/dL) with  cup of clear juice (cranberry or apple), 4 glucose tablets, OR glucose gel. o Recheck blood sugar in 15 minutes after treatment (to make sure it is greater than 70 mg/dL). If your blood sugar is not greater than 70 mg/dL on  recheck, call [phone] for further instructions. . Report your blood sugar to the short stay nurse when you get to Short Stay.  . If you  are admitted to the hospital after surgery: o Your blood sugar will be checked by the staff and you will probably be given insulin after surgery (instead of oral diabetes medicines) to make sure you have good blood sugar levels. o The goal for blood sugar control after surgery is 80-180 mg/dL.   WHAT DO I DO ABOUT MY DIABETES MEDICATION?  Marland Kitchen Do not take oral diabetes medicines (pills) the morning of surgery.  . THE DAY  BEFORE SURGERY: TAKE YOUR GLIMEPIRIDE DOSE as USUAL IN THE MORNING.  .  . Take your Tyler Aas dose as usual       . THE MORNING OF SURGERY: Do not  take   Your glimepiride, Do not take your [place].      Patient Signature:  Date:   Nurse Signature:  Date:   Reviewed and Endorsed by [facility] Patient Education Committee, August 2015                        You may not have any metal on your body               Do not wear jewelry, lotions, powders or perfumes, deodorant             Men may shave face and neck.   Do not bring valuables to the hospital. [place].  Contacts, dentures or bridgework may not be worn into surgery.  Leave suitcase in the car. After surgery it may be brought to your room.     Patients discharged the day of surgery will not be allowed to drive home. IF YOU ARE HAVING SURGERY AND GOING HOME THE SAME DAY, YOU MUST HAVE AN ADULT TO DRIVE YOU HOME AND BE WITH YOU FOR 24 HOURS. YOU MAY GO HOME BY TAXI OR UBER OR ORTHERWISE, BUT AN ADULT MUST ACCOMPANY YOU HOME AND STAY WITH YOU FOR 24 HOURS.  Name and phone number of your driver:  Special Instructions: N/A              Please read over the following fact sheets you were given: _____________________________________________________________________  [facility] - Preparing for Surgery Before surgery,  you can play an important role.  Because skin is not sterile, your skin needs to be as free of germs as possible.  You can reduce the number of germs on your skin by washing with CHG (chlorahexidine gluconate) soap before surgery.  CHG is an antiseptic cleaner which kills germs and bonds with the skin to continue killing germs even after washing. Please DO NOT use if you have an allergy to CHG or antibacterial soaps.  If your skin becomes reddened/irritated stop using the CHG and inform your nurse when you arrive at Short Stay. Do not shave (including legs and underarms) for at least 48 hours prior to the first CHG shower.  You may shave your face/neck. Please follow these instructions carefully:  1.  Shower with CHG Soap the night before surgery and the  morning of Surgery.  2.  If you choose to wash your hair, wash your hair first as usual with your  normal  shampoo.  3.  After you shampoo, rinse your hair and body thoroughly to remove the  shampoo.  4.  Use CHG as you would any other liquid soap.  You can apply chg directly  to the skin and wash                       Gently with a scrungie or clean washcloth.  5.  Apply the CHG Soap to your body ONLY FROM THE NECK DOWN.   Do not use on face/ open                           Wound or open sores. Avoid contact with eyes, ears mouth and genitals (private parts).                       Wash face,  Genitals (private parts) with your normal soap.             6.  Wash thoroughly, paying special attention to the area where your surgery  will be performed.  7.  Thoroughly rinse your body with warm water from the neck down.  8.  DO NOT shower/wash with your normal soap after using and rinsing off  the CHG Soap.                9.  Pat yourself dry with a clean towel.            10.  Wear clean pajamas.            11.  Place clean sheets on your bed the night of your first shower and do not  sleep with pets. Day of Surgery : Do not  apply any lotions/deodorants the morning of surgery.  Please wear clean clothes to the hospital/surgery center.  FAILURE TO FOLLOW THESE INSTRUCTIONS MAY RESULT IN THE CANCELLATION OF YOUR SURGERY PATIENT SIGNATURE_________________________________  NURSE SIGNATURE__________________________________  ________________________________________________________________________   Adam Phenix  An incentive spirometer is a tool that can help keep your lungs clear and active. This tool measures how well you are filling your lungs with each breath. Taking long deep breaths may help reverse or decrease the chance of developing breathing (pulmonary) problems (especially infection) following:  A long period of time when you are unable to move or be active. BEFORE THE PROCEDURE   If the spirometer includes an indicator to show your best effort, your nurse or respiratory therapist will set it to a desired goal.  If possible, sit up straight or lean slightly forward. Try not to slouch.  Hold the incentive spirometer in an upright position. INSTRUCTIONS FOR USE  1. Sit on the edge of your bed if possible, or sit up as far as you can in bed or on a chair. 2. Hold the incentive spirometer in an upright position. 3. Breathe out normally. 4. Place the mouthpiece in your mouth and seal your lips tightly around it. 5. Breathe in slowly and as deeply as possible, raising the piston or the ball toward the top of the column. 6. Hold your breath for 3-5 seconds or for as long as possible. Allow the piston or ball to fall to the bottom of the column. 7. Remove the mouthpiece from your mouth and breathe out normally. 8. Rest for a few seconds and repeat Steps 1 through 7 at least 10 times every 1-2 hours when you are awake. Take your time and take a few normal breaths between deep breaths. 9. The spirometer may include an indicator to show  your best effort. Use the indicator as a goal to work toward during  each repetition. 10. After each set of 10 deep breaths, practice coughing to be sure your lungs are clear. If you have an incision (the cut made at the time of surgery), support your incision when coughing by placing a pillow or rolled up towels firmly against it. Once you are able to get out of bed, walk around indoors and cough well. You may stop using the incentive spirometer when instructed by your caregiver.  RISKS AND COMPLICATIONS  Take your time so you do not get dizzy or light-headed.  If you are in pain, you may need to take or ask for pain medication before doing incentive spirometry. It is harder to take a deep breath if you are having pain. AFTER USE  Rest and breathe slowly and easily.  It can be helpful to keep track of a log of your progress. Your caregiver can provide you with a simple table to help with this. If you are using the spirometer at home, follow these instructions: [place] IF:   You are having difficultly using the spirometer.  You have trouble using the spirometer as often as instructed.  Your pain medication is not giving enough relief while using the spirometer.  You develop fever of 100.5 F (38.1 C) or higher. SEEK IMMEDIATE MEDICAL CARE IF:   You cough up bloody sputum that had not been present before.  You develop fever of 102 F (38.9 C) or greater.  You develop worsening pain at or near the incision site. MAKE SURE YOU:   Understand these instructions.  Will watch your condition.  Will get help right away if you are not doing well or get worse. Document Released: 07/19/2006 Document Revised: 05/31/2011 Document Reviewed: 09/19/2006 [facility] Patient Information 2014 [place], [place].   ________________________________________________________________________

## 2019-03-13 NOTE — Progress Notes (Signed)
Chief Complaint  Patient presents with  . Follow-up    Recheck on CTR, DOS 02-22-19.   [name] had a carpal tunnel release and we took the rest of the stitches out today  The wound was clean his symptoms are all resolved follow-up as needed

## 2019-03-14 ENCOUNTER — Encounter (HOSPITAL_COMMUNITY)
Admission: RE | Admit: 2019-03-14 | Discharge: 2019-03-14 | Disposition: A | Discharge status: Home / Self Care | Payer: BC Managed Care – PPO | Source: Ambulatory Visit | Attending: Orthopaedic Surgery | Admitting: Orthopaedic Surgery

## 2019-03-14 ENCOUNTER — Encounter (HOSPITAL_COMMUNITY): Payer: Self-pay

## 2019-03-14 ENCOUNTER — Other Ambulatory Visit: Payer: Self-pay

## 2019-03-14 DIAGNOSIS — Z01818 Encounter for other preprocedural examination: Secondary | ICD-10-CM | POA: Insufficient documentation

## 2019-03-14 NOTE — Progress Notes (Addendum)
PCP - [name] Avva  Cardiologist - Binnie Kand. NP . LOV: 12/11/18. EPIC  Chest x-ray -  EKG - 11/01/18 EPIC Stress Test - 12/04/18 EPIC ECHO - 12/07/18 EPIC Cardiac Cath -   Sleep Study -  CPAP -   Fasting Blood Sugar -  Checks Blood Sugar _____ times a day  Blood Thinner Instructions: Aspirin Instructions: Last Dose:  Anesthesia review:   Patient denies shortness of breath, fever, cough and chest pain at PAT appointment   Patient verbalized understanding of instructions that were given to them at the PAT appointment. Patient was also instructed that they will need to review over the PAT instructions again at home before surgery.

## 2019-03-16 ENCOUNTER — Other Ambulatory Visit: Payer: Self-pay | Admitting: Cardiology

## 2019-03-16 DIAGNOSIS — I10 Essential (primary) hypertension: Secondary | ICD-10-CM

## 2019-03-17 ENCOUNTER — Other Ambulatory Visit (HOSPITAL_COMMUNITY)
Admission: RE | Admit: 2019-03-17 | Discharge: 2019-03-17 | Disposition: A | Discharge status: Home / Self Care | Payer: BC Managed Care – PPO | Source: Ambulatory Visit | Attending: Orthopaedic Surgery | Admitting: Orthopaedic Surgery

## 2019-03-17 DIAGNOSIS — Z20828 Contact with and (suspected) exposure to other viral communicable diseases: Secondary | ICD-10-CM | POA: Insufficient documentation

## 2019-03-17 DIAGNOSIS — Z01812 Encounter for preprocedural laboratory examination: Secondary | ICD-10-CM | POA: Diagnosis present

## 2019-03-18 LAB — NOVEL CORONAVIRUS, NAA (HOSP ORDER, SEND-OUT TO REF LAB; TAT 18-24 HRS): SARS-CoV-2, NAA: NOT DETECTED

## 2019-03-19 ENCOUNTER — Encounter (HOSPITAL_COMMUNITY)
Admission: RE | Admit: 2019-03-19 | Discharge: 2019-03-19 | Disposition: A | Discharge status: Home / Self Care | Payer: BC Managed Care – PPO | Source: Ambulatory Visit | Attending: Orthopaedic Surgery | Admitting: Orthopaedic Surgery

## 2019-03-19 ENCOUNTER — Other Ambulatory Visit: Payer: Self-pay

## 2019-03-19 DIAGNOSIS — Z01812 Encounter for preprocedural laboratory examination: Secondary | ICD-10-CM | POA: Diagnosis not present

## 2019-03-19 LAB — BASIC METABOLIC PANEL
Anion gap: 8 (ref 5–15)
BUN: 25 mg/dL — ABNORMAL HIGH (ref 8–23)
CO2: 29 mmol/L (ref 22–32)
Calcium: 9.4 mg/dL (ref 8.9–10.3)
Chloride: 103 mmol/L (ref 98–111)
Creatinine, Ser: 0.93 mg/dL (ref 0.61–1.24)
GFR calc Af Amer: >60 mL/min (ref >60)
GFR calc non Af Amer: >60 mL/min (ref >60)
Glucose, Bld: 147 mg/dL — ABNORMAL HIGH (ref 70–99)
Potassium: 4.7 mmol/L (ref 3.5–5.1)
Sodium: 140 mmol/L (ref 135–145)

## 2019-03-19 LAB — CBC
HCT: 42.4 % (ref 39.0–52.0)
Hemoglobin: 14.7 g/dL (ref 13.0–17.0)
MCH: 31.1 pg (ref 26.0–34.0)
MCHC: 34.7 g/dL (ref 30.0–36.0)
MCV: 89.6 fL (ref 80.0–100.0)
Platelets: 154 10*3/uL (ref 150–400)
RBC: 4.73 MIL/uL (ref 4.22–5.81)
RDW: 12.1 % (ref 11.5–15.5)
WBC: 4.7 10*3/uL (ref 4.0–10.5)
nRBC: 0 % (ref 0.0–0.2)

## 2019-03-19 LAB — SURGICAL PCR SCREEN
MRSA, PCR: NEGATIVE
Staphylococcus aureus: POSITIVE — AB

## 2019-03-19 LAB — GLUCOSE, CAPILLARY: Glucose-Capillary: 129 mg/dL — ABNORMAL HIGH (ref 70–99)

## 2019-03-19 LAB — HEMOGLOBIN A1C
Hgb A1c MFr Bld: 7.2 % — ABNORMAL HIGH (ref 4.8–5.6)
Mean Plasma Glucose: 159.94 mg/dL

## 2019-03-20 NOTE — Anesthesia Preprocedure Evaluation (Addendum)
Anesthesia Evaluation  Patient identified by MRN, date of birth, ID band Patient awake    Reviewed: Allergy & Precautions, NPO status , Patient's Chart, lab work & pertinent test results  Airway Mallampati: II  TM Distance: >3 FB Neck ROM: Full    Dental no notable dental hx. (+) Teeth Intact   Pulmonary neg pulmonary ROS,    Pulmonary exam normal breath sounds clear to auscultation       Cardiovascular hypertension, Pt. on medications Normal cardiovascular exam Rhythm:Regular Rate:Normal     Neuro/Psych negative neurological ROS  negative psych ROS   GI/Hepatic negative GI ROS, Neg liver ROS,   Endo/Other  diabetes, Type 2  Renal/GU negative Renal ROS     Musculoskeletal negative musculoskeletal ROS (+)   Abdominal   Peds negative pediatric ROS (+)  Hematology negative hematology ROS (+)   Anesthesia Other Findings   Reproductive/Obstetrics                            Anesthesia Physical Anesthesia Plan  ASA: III  Anesthesia Plan: Spinal   Post-op Pain Management:  Regional for Post-op pain   Induction:   PONV Risk Score and Plan: 2 and Treatment may vary due to age or medical condition, Ondansetron and Midazolam  Airway Management Planned: Nasal Cannula and Natural Airway  Additional Equipment: None  Intra-op Plan:   Post-operative Plan:   Informed Consent: I have reviewed the patients History and Physical, chart, labs and discussed the procedure including the risks, benefits and alternatives for the proposed anesthesia with the patient or authorized representative who has indicated his/her understanding and acceptance.     Dental advisory given  Plan Discussed with: CRNA  Anesthesia Plan Comments: (SP w L adductor canal)       Anesthesia Quick Evaluation

## 2019-03-21 ENCOUNTER — Observation Stay (HOSPITAL_COMMUNITY)
Admission: RE | Admit: 2019-03-21 | Discharge: 2019-03-22 | Disposition: A | Discharge status: Home / Self Care | Payer: BC Managed Care – PPO | Attending: Orthopaedic Surgery | Admitting: Orthopaedic Surgery

## 2019-03-21 ENCOUNTER — Encounter (HOSPITAL_COMMUNITY): Payer: Self-pay | Admitting: Orthopaedic Surgery

## 2019-03-21 ENCOUNTER — Other Ambulatory Visit: Payer: Self-pay

## 2019-03-21 ENCOUNTER — Encounter (HOSPITAL_COMMUNITY)
Admission: RE | Disposition: A | Discharge status: Home / Self Care | Payer: Self-pay | Source: Home / Self Care | Attending: Orthopaedic Surgery

## 2019-03-21 ENCOUNTER — Ambulatory Visit (HOSPITAL_COMMUNITY): Payer: BC Managed Care – PPO | Admitting: Physician Assistant

## 2019-03-21 ENCOUNTER — Observation Stay (HOSPITAL_COMMUNITY): Payer: BC Managed Care – PPO

## 2019-03-21 DIAGNOSIS — I10 Essential (primary) hypertension: Secondary | ICD-10-CM | POA: Insufficient documentation

## 2019-03-21 DIAGNOSIS — E785 Hyperlipidemia, unspecified: Secondary | ICD-10-CM | POA: Insufficient documentation

## 2019-03-21 DIAGNOSIS — Z96652 Presence of left artificial knee joint: Secondary | ICD-10-CM

## 2019-03-21 DIAGNOSIS — M1712 Unilateral primary osteoarthritis, left knee: Secondary | ICD-10-CM | POA: Diagnosis present

## 2019-03-21 DIAGNOSIS — Z79899 Other long term (current) drug therapy: Secondary | ICD-10-CM | POA: Diagnosis not present

## 2019-03-21 DIAGNOSIS — E119 Type 2 diabetes mellitus without complications: Secondary | ICD-10-CM | POA: Insufficient documentation

## 2019-03-21 DIAGNOSIS — Z8546 Personal history of malignant neoplasm of prostate: Secondary | ICD-10-CM | POA: Insufficient documentation

## 2019-03-21 HISTORY — PX: TOTAL KNEE ARTHROPLASTY: SHX125

## 2019-03-21 LAB — GLUCOSE, CAPILLARY
Glucose-Capillary: 124 mg/dL — ABNORMAL HIGH (ref 70–99)
Glucose-Capillary: 154 mg/dL — ABNORMAL HIGH (ref 70–99)

## 2019-03-21 SURGERY — ARTHROPLASTY, KNEE, TOTAL
Anesthesia: Spinal | Site: Knee | Laterality: Left

## 2019-03-21 MED ORDER — OXYCODONE HCL 5 MG PO TABS
10.0000 mg | ORAL_TABLET | ORAL | Status: DC | PRN
  Administered 2019-03-21 – 2019-03-22 (×2): 15 mg via ORAL
  Administered 2019-03-22: 10 mg via ORAL
  Filled 2019-03-21 (×3): qty 3

## 2019-03-21 MED ORDER — GLIMEPIRIDE 1 MG PO TABS
1.0000 mg | ORAL_TABLET | Freq: Every day | ORAL | Status: DC
  Administered 2019-03-22: 1 mg via ORAL
  Filled 2019-03-21: qty 1

## 2019-03-21 MED ORDER — ACETAMINOPHEN 325 MG PO TABS: 325.0000 mg | ORAL_TABLET | Freq: Four times a day (QID) | ORAL | Status: DC | PRN

## 2019-03-21 MED ORDER — METOCLOPRAMIDE HCL 5 MG PO TABS: 5.0000 mg | ORAL_TABLET | Freq: Three times a day (TID) | ORAL | Status: DC | PRN

## 2019-03-21 MED ORDER — ALBUMIN HUMAN 5 % IV SOLN
INTRAVENOUS | Status: AC
  Filled 2019-03-21: qty 250

## 2019-03-21 MED ORDER — ASPIRIN EC 325 MG PO TBEC
325.0000 mg | DELAYED_RELEASE_TABLET | Freq: Two times a day (BID) | ORAL | Status: DC
  Administered 2019-03-22 (×2): 325 mg via ORAL
  Filled 2019-03-21 (×2): qty 1

## 2019-03-21 MED ORDER — LOSARTAN POTASSIUM-HCTZ 50-12.5 MG PO TABS
1.0000 | ORAL_TABLET | Freq: Every day | ORAL | Status: DC
Start: 2019-03-21 — End: 2019-03-21

## 2019-03-21 MED ORDER — PROPOFOL 500 MG/50ML IV EMUL
INTRAVENOUS | Status: AC
  Filled 2019-03-21: qty 150

## 2019-03-21 MED ORDER — PANTOPRAZOLE SODIUM 40 MG PO TBEC
40.0000 mg | DELAYED_RELEASE_TABLET | Freq: Every day | ORAL | Status: DC
  Administered 2019-03-22: 40 mg via ORAL
  Filled 2019-03-21: qty 1

## 2019-03-21 MED ORDER — ROPIVACAINE HCL 7.5 MG/ML IJ SOLN
INTRAMUSCULAR | Status: DC | PRN
  Administered 2019-03-21: 20 mL via PERINEURAL

## 2019-03-21 MED ORDER — BUPIVACAINE HCL (PF) 0.25 % IJ SOLN
INTRAMUSCULAR | Status: DC | PRN
  Administered 2019-03-21: 30 mL

## 2019-03-21 MED ORDER — ACETAMINOPHEN 10 MG/ML IV SOLN: 1000.0000 mg | Freq: Once | INTRAVENOUS | Status: DC | PRN

## 2019-03-21 MED ORDER — INSULIN DEGLUDEC 100 UNIT/ML ~~LOC~~ SOPN: 30.0000 [IU] | PEN_INJECTOR | Freq: Every day | SUBCUTANEOUS | Status: DC

## 2019-03-21 MED ORDER — TRANEXAMIC ACID-NACL 1000-0.7 MG/100ML-% IV SOLN
1000.0000 mg | INTRAVENOUS | Status: AC
  Administered 2019-03-21: 1000 mg via INTRAVENOUS
  Filled 2019-03-21: qty 100

## 2019-03-21 MED ORDER — HYDROMORPHONE HCL 1 MG/ML IJ SOLN
0.5000 mg | INTRAMUSCULAR | Status: DC | PRN
  Administered 2019-03-21 – 2019-03-22 (×3): 1 mg via INTRAVENOUS
  Filled 2019-03-21 (×3): qty 1

## 2019-03-21 MED ORDER — PROPOFOL 10 MG/ML IV BOLUS
INTRAVENOUS | Status: DC | PRN
  Administered 2019-03-21: 95 ug/kg/min via INTRAVENOUS
  Administered 2019-03-21: 20 mg via INTRAVENOUS

## 2019-03-21 MED ORDER — OXYCODONE HCL 5 MG PO TABS
5.0000 mg | ORAL_TABLET | ORAL | Status: DC | PRN
  Administered 2019-03-22 (×2): 10 mg via ORAL
  Filled 2019-03-21 (×2): qty 2

## 2019-03-21 MED ORDER — MIDAZOLAM HCL 2 MG/2ML IJ SOLN
INTRAMUSCULAR | Status: AC
  Filled 2019-03-21: qty 2

## 2019-03-21 MED ORDER — ZOLPIDEM TARTRATE 5 MG PO TABS
5.0000 mg | ORAL_TABLET | Freq: Every evening | ORAL | Status: DC | PRN
  Administered 2019-03-21: 5 mg via ORAL
  Filled 2019-03-21: qty 1

## 2019-03-21 MED ORDER — ESCITALOPRAM OXALATE 5 MG PO TABS
5.0000 mg | ORAL_TABLET | Freq: Every day | ORAL | Status: DC
  Administered 2019-03-21 – 2019-03-22 (×2): 5 mg via ORAL
  Filled 2019-03-21 (×2): qty 1

## 2019-03-21 MED ORDER — BUPIVACAINE HCL (PF) 0.5 % IJ SOLN
INTRAMUSCULAR | Status: AC
  Filled 2019-03-21: qty 60

## 2019-03-21 MED ORDER — ROSUVASTATIN CALCIUM 20 MG PO TABS
20.0000 mg | ORAL_TABLET | Freq: Every day | ORAL | Status: DC
  Administered 2019-03-21 – 2019-03-22 (×2): 20 mg via ORAL
  Filled 2019-03-21 (×2): qty 1

## 2019-03-21 MED ORDER — ONDANSETRON HCL 4 MG/2ML IJ SOLN
4.0000 mg | Freq: Four times a day (QID) | INTRAMUSCULAR | Status: DC | PRN
  Administered 2019-03-22: 4 mg via INTRAVENOUS
  Filled 2019-03-21: qty 2

## 2019-03-21 MED ORDER — ONDANSETRON HCL 4 MG/2ML IJ SOLN
INTRAMUSCULAR | Status: DC | PRN
  Administered 2019-03-21: 4 mg via INTRAVENOUS

## 2019-03-21 MED ORDER — PHENYLEPHRINE HCL-NACL 10-0.9 MG/250ML-% IV SOLN
INTRAVENOUS | Status: DC | PRN
  Administered 2019-03-21: 10 ug/min via INTRAVENOUS

## 2019-03-21 MED ORDER — 0.9 % SODIUM CHLORIDE (POUR BTL) OPTIME
TOPICAL | Status: DC | PRN
Start: 2019-03-21 — End: 2019-03-21
  Administered 2019-03-21: 1000 mL

## 2019-03-21 MED ORDER — SODIUM CHLORIDE 0.9 % IV SOLN: INTRAVENOUS | Status: DC

## 2019-03-21 MED ORDER — FENTANYL CITRATE (PF) 100 MCG/2ML IJ SOLN
INTRAMUSCULAR | Status: DC | PRN
  Administered 2019-03-21: 100 ug via INTRAVENOUS

## 2019-03-21 MED ORDER — METOCLOPRAMIDE HCL 5 MG/ML IJ SOLN: 5.0000 mg | Freq: Three times a day (TID) | INTRAMUSCULAR | Status: DC | PRN

## 2019-03-21 MED ORDER — METHOCARBAMOL 500 MG PO TABS
500.0000 mg | ORAL_TABLET | Freq: Four times a day (QID) | ORAL | Status: DC | PRN
  Administered 2019-03-22: 500 mg via ORAL
  Filled 2019-03-21: qty 1

## 2019-03-21 MED ORDER — CHLORHEXIDINE GLUCONATE 4 % EX LIQD: 60.0000 mL | Freq: Once | CUTANEOUS | Status: DC

## 2019-03-21 MED ORDER — BUPIVACAINE IN DEXTROSE 0.75-8.25 % IT SOLN
INTRATHECAL | Status: DC | PRN
  Administered 2019-03-21: 1.5 mg via INTRATHECAL

## 2019-03-21 MED ORDER — GABAPENTIN 100 MG PO CAPS
100.0000 mg | ORAL_CAPSULE | Freq: Three times a day (TID) | ORAL | Status: DC
  Administered 2019-03-21 – 2019-03-22 (×3): 100 mg via ORAL
  Filled 2019-03-21 (×4): qty 1

## 2019-03-21 MED ORDER — STERILE WATER FOR IRRIGATION IR SOLN
Status: DC | PRN
  Administered 2019-03-21: 2000 mL

## 2019-03-21 MED ORDER — CLONIDINE HCL (ANALGESIA) 100 MCG/ML EP SOLN
EPIDURAL | Status: DC | PRN
  Administered 2019-03-21: 100 ug

## 2019-03-21 MED ORDER — FENTANYL CITRATE (PF) 100 MCG/2ML IJ SOLN
INTRAMUSCULAR | Status: AC
  Filled 2019-03-21: qty 2

## 2019-03-21 MED ORDER — DOCUSATE SODIUM 100 MG PO CAPS
100.0000 mg | ORAL_CAPSULE | Freq: Two times a day (BID) | ORAL | Status: DC
  Administered 2019-03-21 – 2019-03-22 (×2): 100 mg via ORAL
  Filled 2019-03-21 (×2): qty 1

## 2019-03-21 MED ORDER — CEFAZOLIN SODIUM-DEXTROSE 2-4 GM/100ML-% IV SOLN
2.0000 g | INTRAVENOUS | Status: AC
  Administered 2019-03-21: 2 g via INTRAVENOUS
  Filled 2019-03-21: qty 100

## 2019-03-21 MED ORDER — ONDANSETRON HCL 4 MG PO TABS: 4.0000 mg | ORAL_TABLET | Freq: Four times a day (QID) | ORAL | Status: DC | PRN

## 2019-03-21 MED ORDER — LOSARTAN POTASSIUM 50 MG PO TABS
50.0000 mg | ORAL_TABLET | Freq: Every day | ORAL | Status: DC
  Filled 2019-03-21 (×2): qty 1

## 2019-03-21 MED ORDER — ALUM & MAG HYDROXIDE-SIMETH 200-200-20 MG/5ML PO SUSP: 30.0000 mL | ORAL | Status: DC | PRN

## 2019-03-21 MED ORDER — POVIDONE-IODINE 10 % EX SWAB
2.0000 "application " | Freq: Once | CUTANEOUS | Status: AC
  Administered 2019-03-21: 2 via TOPICAL

## 2019-03-21 MED ORDER — SODIUM CHLORIDE 0.9 % IR SOLN
Status: DC | PRN
  Administered 2019-03-21: 3000 mL

## 2019-03-21 MED ORDER — DIPHENHYDRAMINE HCL 12.5 MG/5ML PO ELIX
12.5000 mg | ORAL_SOLUTION | ORAL | Status: DC | PRN
Start: 2019-03-21 — End: 2019-03-22

## 2019-03-21 MED ORDER — DEXAMETHASONE SODIUM PHOSPHATE 10 MG/ML IJ SOLN
INTRAMUSCULAR | Status: DC | PRN
  Administered 2019-03-21: 5 mg via INTRAVENOUS

## 2019-03-21 MED ORDER — MENTHOL 3 MG MT LOZG: 1.0000 | LOZENGE | OROMUCOSAL | Status: DC | PRN

## 2019-03-21 MED ORDER — ONDANSETRON HCL 4 MG/2ML IJ SOLN: 4.0000 mg | Freq: Once | INTRAMUSCULAR | Status: DC | PRN

## 2019-03-21 MED ORDER — HYDROCHLOROTHIAZIDE 12.5 MG PO CAPS
12.5000 mg | ORAL_CAPSULE | Freq: Every day | ORAL | Status: DC
  Administered 2019-03-22: 12.5 mg via ORAL
  Filled 2019-03-21 (×2): qty 1

## 2019-03-21 MED ORDER — PHENYLEPHRINE HCL-NACL 10-0.9 MG/250ML-% IV SOLN
INTRAVENOUS | Status: AC
  Filled 2019-03-21: qty 250

## 2019-03-21 MED ORDER — ONDANSETRON HCL 4 MG/2ML IJ SOLN
INTRAMUSCULAR | Status: AC
  Filled 2019-03-21: qty 4

## 2019-03-21 MED ORDER — DEXAMETHASONE SODIUM PHOSPHATE 10 MG/ML IJ SOLN
INTRAMUSCULAR | Status: AC
  Filled 2019-03-21: qty 2

## 2019-03-21 MED ORDER — INSULIN GLARGINE 100 UNIT/ML ~~LOC~~ SOLN
30.0000 [IU] | Freq: Every day | SUBCUTANEOUS | Status: DC
  Administered 2019-03-22: 30 [IU] via SUBCUTANEOUS
  Filled 2019-03-21: qty 0.3

## 2019-03-21 MED ORDER — ICOSAPENT ETHYL 1 G PO CAPS
2.0000 g | ORAL_CAPSULE | Freq: Two times a day (BID) | ORAL | Status: DC
  Administered 2019-03-22: 2 g via ORAL
  Filled 2019-03-21 (×3): qty 2

## 2019-03-21 MED ORDER — PHENOL 1.4 % MT LIQD: 1.0000 | OROMUCOSAL | Status: DC | PRN

## 2019-03-21 MED ORDER — METHOCARBAMOL 500 MG IVPB - SIMPLE MED
500.0000 mg | Freq: Four times a day (QID) | INTRAVENOUS | Status: DC | PRN
  Administered 2019-03-21: 500 mg via INTRAVENOUS
  Filled 2019-03-21: qty 50

## 2019-03-21 MED ORDER — METHOCARBAMOL 500 MG IVPB - SIMPLE MED
INTRAVENOUS | Status: AC
  Filled 2019-03-21: qty 50

## 2019-03-21 MED ORDER — CEFAZOLIN SODIUM-DEXTROSE 1-4 GM/50ML-% IV SOLN
1.0000 g | Freq: Four times a day (QID) | INTRAVENOUS | Status: AC
  Administered 2019-03-21 (×2): 1 g via INTRAVENOUS
  Filled 2019-03-21 (×2): qty 50

## 2019-03-21 MED ORDER — FENTANYL CITRATE (PF) 100 MCG/2ML IJ SOLN
25.0000 ug | INTRAMUSCULAR | Status: DC | PRN
  Administered 2019-03-21: 25 ug via INTRAVENOUS
  Administered 2019-03-21: 50 ug via INTRAVENOUS
  Administered 2019-03-21: 25 ug via INTRAVENOUS
  Administered 2019-03-21: 50 ug via INTRAVENOUS

## 2019-03-21 MED ORDER — LACTATED RINGERS IV SOLN: INTRAVENOUS | Status: DC

## 2019-03-21 MED ORDER — BUPIVACAINE HCL (PF) 0.25 % IJ SOLN
INTRAMUSCULAR | Status: AC
  Filled 2019-03-21: qty 30

## 2019-03-21 MED ORDER — PROPOFOL 10 MG/ML IV BOLUS
INTRAVENOUS | Status: AC
  Filled 2019-03-21: qty 40

## 2019-03-21 MED ORDER — MIDAZOLAM HCL 5 MG/5ML IJ SOLN
INTRAMUSCULAR | Status: DC | PRN
  Administered 2019-03-21 (×2): 1 mg via INTRAVENOUS
  Administered 2019-03-21: 2 mg via INTRAVENOUS

## 2019-03-21 SURGICAL SUPPLY — 56 items
BAG SPEC THK2 15X12 ZIP CLS (MISCELLANEOUS)
BAG ZIPLOCK 12X15 (MISCELLANEOUS) IMPLANT
BENZOIN TINCTURE PRP APPL 2/3 (GAUZE/BANDAGES/DRESSINGS) IMPLANT
BLADE SAG 18X100X1.27 (BLADE) ×2 IMPLANT
BLADE SURG SZ10 CARB STEEL (BLADE) ×4 IMPLANT
BNDG CMPR MED 10X6 ELC LF (GAUZE/BANDAGES/DRESSINGS) ×1
BNDG ELASTIC 6X10 VLCR STRL LF (GAUZE/BANDAGES/DRESSINGS) ×2 IMPLANT
BNDG ELASTIC 6X5.8 VLCR STR LF (GAUZE/BANDAGES/DRESSINGS) ×2 IMPLANT
BOWL SMART MIX CTS (DISPOSABLE) IMPLANT
BSPLAT TIB 4 KN TRITANIUM (Knees) ×1 IMPLANT
COVER SURGICAL LIGHT HANDLE (MISCELLANEOUS) ×2 IMPLANT
COVER WAND RF STERILE (DRAPES) IMPLANT
CUFF TOURN SGL QUICK 34 (TOURNIQUET CUFF) ×2
CUFF TRNQT CYL 34X4.125X (TOURNIQUET CUFF) ×1 IMPLANT
DECANTER SPIKE VIAL GLASS SM (MISCELLANEOUS) IMPLANT
DRAPE U-SHAPE 47X51 STRL (DRAPES) ×2 IMPLANT
DRSG PAD ABDOMINAL 8X10 ST (GAUZE/BANDAGES/DRESSINGS) ×2 IMPLANT
DURAPREP 26ML APPLICATOR (WOUND CARE) ×2 IMPLANT
ELECT REM PT RETURN 15FT ADLT (MISCELLANEOUS) ×2 IMPLANT
FEMORAL POSTERIOR SZ5 LFT (Femur) ×1 IMPLANT
GAUZE SPONGE 4X4 12PLY STRL (GAUZE/BANDAGES/DRESSINGS) ×2 IMPLANT
GAUZE XEROFORM 1X8 LF (GAUZE/BANDAGES/DRESSINGS) ×2 IMPLANT
GLOVE BIO SURGEON STRL SZ7.5 (GLOVE) ×2 IMPLANT
GLOVE BIOGEL PI IND STRL 8 (GLOVE) ×2 IMPLANT
GLOVE BIOGEL PI INDICATOR 8 (GLOVE) ×2
GLOVE ECLIPSE 8.0 STRL XLNG CF (GLOVE) ×2 IMPLANT
GOWN STRL REUS W/TWL XL LVL3 (GOWN DISPOSABLE) ×4 IMPLANT
HANDPIECE INTERPULSE COAX TIP (DISPOSABLE) ×2
HOLDER FOLEY CATH W/STRAP (MISCELLANEOUS) IMPLANT
IMMOBILIZER KNEE 20 (SOFTGOODS) ×2
IMMOBILIZER KNEE 20 THIGH 36 (SOFTGOODS) ×1 IMPLANT
INSERT TIB BEARING SZ 4 12 (Insert) ×2 IMPLANT
KIT TURNOVER KIT A (KITS) IMPLANT
KNEE PATELLA ASYMMETRIC 10X32 (Knees) ×2 IMPLANT
KNEE TIBIAL COMP TRI SZ4 (Knees) ×2 IMPLANT
NS IRRIG 1000ML POUR BTL (IV SOLUTION) ×2 IMPLANT
PACK TOTAL KNEE CUSTOM (KITS) ×2 IMPLANT
PADDING CAST COTTON 6X4 STRL (CAST SUPPLIES) ×2 IMPLANT
PENCIL SMOKE EVACUATOR (MISCELLANEOUS) IMPLANT
PIN FLUTED HEDLESS FIX 3.5X1/8 (PIN) ×2 IMPLANT
POSTERIOR FEMORAL SZ5 LFT (Femur) ×2 IMPLANT
PROTECTOR NERVE ULNAR (MISCELLANEOUS) ×2 IMPLANT
SET HNDPC FAN SPRY TIP SCT (DISPOSABLE) ×1 IMPLANT
SET PAD KNEE POSITIONER (MISCELLANEOUS) ×2 IMPLANT
STAPLER VISISTAT 35W (STAPLE) IMPLANT
STRIP CLOSURE SKIN 1/2X4 (GAUZE/BANDAGES/DRESSINGS) IMPLANT
SUT MNCRL AB 4-0 PS2 18 (SUTURE) IMPLANT
SUT VIC AB 0 CT1 27 (SUTURE) ×2
SUT VIC AB 0 CT1 27XBRD ANTBC (SUTURE) ×1 IMPLANT
SUT VIC AB 1 CT1 36 (SUTURE) ×4 IMPLANT
SUT VIC AB 2-0 CT1 27 (SUTURE) ×4
SUT VIC AB 2-0 CT1 TAPERPNT 27 (SUTURE) ×2 IMPLANT
TRAY FOLEY MTR SLVR 16FR STAT (SET/KITS/TRAYS/PACK) ×2 IMPLANT
WATER STERILE IRR 1000ML POUR (IV SOLUTION) ×2 IMPLANT
WRAP KNEE MAXI GEL POST OP (GAUZE/BANDAGES/DRESSINGS) ×2 IMPLANT
YANKAUER SUCT BULB TIP 10FT TU (MISCELLANEOUS) ×2 IMPLANT

## 2019-03-21 NOTE — Brief Op Note (Signed)
03/21/2019  8:53 AM  PATIENT:  [name]  63 y.o. male  PRE-OPERATIVE DIAGNOSIS:  osteoarthritis left knee  POST-OPERATIVE DIAGNOSIS:  osteoarthritis left knee  PROCEDURE:  Procedure(s): LEFT TOTAL KNEE ARTHROPLASTY (Left)  SURGEON:  Surgeon(s) and Role:    Mcarthur Rossetti, MD - Primary  ASSISTANTS: Leila, RNFA   ANESTHESIA:   local, regional and spinal  EBL:  75 mL   COUNTS:  YES  TOURNIQUET:   Total Tourniquet Time Documented: Thigh (Left) - 55 minutes Total: Thigh (Left) - 55 minutes   DICTATION: .Other Dictation: Dictation Number [id number]  PLAN OF CARE: Admit for overnight observation  PATIENT DISPOSITION:  PACU - hemodynamically stable.   Delay start of Pharmacological VTE agent (>24hrs) due to surgical blood loss or risk of bleeding: no

## 2019-03-21 NOTE — Anesthesia Postprocedure Evaluation (Signed)
Anesthesia Post Note  Patient: [name]  Procedure(s) Performed: LEFT TOTAL KNEE ARTHROPLASTY (Left Knee)     Patient location during evaluation: Nursing Unit Anesthesia Type: Spinal Level of consciousness: oriented and awake and alert Pain management: pain level controlled Vital Signs Assessment: post-procedure vital signs reviewed and stable Respiratory status: spontaneous breathing and respiratory function stable Cardiovascular status: blood pressure returned to baseline and stable Postop Assessment: no headache, no backache, no apparent nausea or vomiting and patient able to bend at knees Anesthetic complications: no    Last Vitals:  Vitals:   03/21/19 1431 03/21/19 1537  BP: 110/73 (!) 96/55  Pulse: 62 68  Resp: 16 17  Temp: 36.5 C   SpO2: 100% 96%    Last Pain:  Vitals:   03/21/19 1500  TempSrc:   PainSc: 0-No pain                 Barnet Glasgow

## 2019-03-21 NOTE — Anesthesia Procedure Notes (Signed)
Anesthesia Regional Block: Adductor canal block   Pre-Anesthetic Checklist: ,, timeout performed, Correct Patient, Correct Site, Correct Laterality, Correct Procedure, Correct Position, site marked, Risks and benefits discussed,  Surgical consent,  Pre-op evaluation,  At surgeon's request and post-op pain management  Laterality: Lower and Left  Prep: chloraprep       Needles:  Injection technique: Single-shot  Needle Type: Echogenic Needle     Needle Length: 9cm  Needle Gauge: 22     Additional Needles:   Procedures:,,,, ultrasound used (permanent image in chart),,,,  Narrative:  Start time: 03/21/2019 6:54 AM End time: 03/21/2019 7:04 AM Injection made incrementally with aspirations every 5 mL.  Performed by: Personally  Anesthesiologist: Barnet Glasgow, MD  Additional Notes: Block assessed prior to surgery. Pt tolerated procedure well.

## 2019-03-21 NOTE — Transfer of Care (Signed)
Immediate Anesthesia Transfer of Care Note  Patient: [name]  Procedure(s) Performed: Procedure(s): LEFT TOTAL KNEE ARTHROPLASTY (Left)  Patient Location: PACU  Anesthesia Type:Spinal  Level of Consciousness:  sedated, patient cooperative and responds to stimulation  Airway & Oxygen Therapy:Patient Spontanous Breathing and Patient connected to face mask oxgen  Post-op Assessment:  Report given to PACU RN and Post -op Vital signs reviewed and stable  Post vital signs:  Reviewed and stable  Last Vitals:  Vitals:   03/21/19 0543  BP: 125/83  Pulse: 67  Resp: 16  Temp: 37.1 C  SpO2: [id number]    Complications: No apparent anesthesia complications

## 2019-03-21 NOTE — Op Note (Signed)
NAME: [name], [name] MEDICAL RECORD [id number] ACCOUNT [id number] DATE OF BIRTH:19-May-1955 FACILITY: [facility] LOCATION: [facility] PHYSICIAN:Linsay Vogt Kerry [place], MD  OPERATIVE REPORT  DATE OF PROCEDURE:  03/21/2019  PREOPERATIVE DIAGNOSIS:  Primary osteoarthritis and degenerative joint disease, left knee.  POSTOPERATIVE DIAGNOSIS:  Primary osteoarthritis and degenerative joint disease, left knee.  PROCEDURE:  Left total knee arthroplasty.  IMPLANTS:  Stryker Triathlon press-fit knee system with size 5 femur, size 4 tibial tray, 12 mm thickness, fixed-bearing polyethylene insert, size 32 patellar button.  SURGEON:  Lind Guest. Ninfa Linden, MD  ASSISTANTAlbertina Senegal, RNFA  TOURNIQUET TIME:  Less than 1 hour.  ANTIBIOTICS:  Two grams IV Ancef.  ANESTHESIA: 1.  Left lower extremity adductor canal block 2.  Spinal. 3.  Local with 0.25% plain Marcaine.  ESTIMATED BLOOD LOSS:  100 mL.  COMPLICATIONS:  None.  INDICATIONS:  The patient is a very pleasant 63 year old gentleman well known to me.  He has debilitating arthritis involving his left knee.  He had arthroscopic intervention many years ago for that knee.  Since that time, he developed a flexion  contracture and severe tricompartmental arthritic changes.  It is evident on plain films and clinical exam.  At this point, he has tried and failed all forms of conservative treatment, and his pain is daily and is detrimentally affecting his mobility,  his quality of life, and his activities of daily living to the point he does wish to proceed with a total knee arthroplasty.  We had a long and thorough discussion about this type of surgery.  We talked about the risk of acute blood loss anemia, nerve  and vessel injury, fracture, infection, implant failure, and DVT.  We talked about our goals being decreased pain, improved mobility, and overall improved quality of life.  DESCRIPTION OF PROCEDURE:  After informed consent was obtained and  appropriate left knee was marked, an adductor canal block was obtained in the holding room.  He was then brought to the operating room and sat up on the operating table where spinal  anesthesia was obtained.  He was laid in supine position on the operating table.  Foley catheter was placed and a nonsterile tourniquet was placed around the upper left thigh.  His left thigh, knee, leg, ankle and foot were prepped and draped with  DuraPrep and sterile drapes including a sterile stockinette.  Time-out was called, and he was identified as correct patient and correct left knee.  We then used an Esmarch to wrap that leg, and tourniquet was inflated to 300 mm of pressure.  We then made  a direct midline incision over the patella and carried this proximally and distally.  We dissected down the knee joint and carried out a medial parapatellar arthrotomy, finding significant deformity within the knee with periarticular osteophytes and a  large effusion.  With the knee in a flexed position, we removed remnants of ACL, PCL, medial and lateral meniscus.  We then set our extramedullary cutting guide for making our proximal tibia cut, correcting for varus and valgus and neutral slope, taking  9 mm off the high side.  Once we made this cut, I felt like I needed to back him down 2 more millimeters, so we took an additional 2 mm.  We then went to the femur and used the intramedullary guide for the femur.  We set this for a left knee at 5  degrees, externally rotated for a 10 mm distal femoral cut.  We made this cut without difficulty.  Due to his significant flexion contracture, it took 2 mm off of this.  We brought him down to full extension with a 9 mm extension block and achieved full  extension.  We then removed more remnants of the medial and lateral meniscus and debris from the back of the knee.  Again with the knee in a flexed position, we used our femoral sizing guide based off the epicondylar axis, choosing a size 5  femur.  With  a 4-in-1 cutting block for a size 5 femur, we made our anterior and posterior cuts, followed by our chamfer cuts.  We then made our femoral box cut based on the size 5 femur.  Attention was then turned back to the tibia.  We chose a size 4 tibial tray  for coverage, setting the rotation off the tibial tubercle and the femur.  We made our keel punch off of this using a press-fit system due to his young age and his good quality of bone.  With the size 4 trial tibia, we trialed the size 5 femur, and I  went with a 9 and then 11 mm fixed bearing polyethylene insert.  I felt that the final insert needed to be a 12.  We then made our patellar cut and drilled 3 holes for a size 32 press-fit patellar button.  With all trial instrumentation in the knee, I  put the knee through range of motion.  I was pleased with stability and range of motion.  We then removed all instrumentation from the knee and irrigated the knee with normal saline solution using pulsatile lavage.  I placed my plain Marcaine around the  arthrotomy and then dried the knee real well.  We then placed our real Stryker press-fit tibial tray size 4, followed by our size 5 left femur that was press-fit.  We placed our 12 mm fixed bearing polyethylene insert and press-fit our size 32 patellar  button.  Again, I put the knee through range of motion.  I was pleased with stability and motion of the knee.  We then let the tourniquet down.  Hemostasis was obtained with electrocautery.  We closed the arthrotomy with interrupted #1 Vicryl suture,  followed by closing the deep tissue with 0 Vicryl.  2-0 Vicryl was used to close the subcutaneous tissue, and interrupted staples were placed on the skin.  Xeroform and a well-padded sterile dressing were applied.  He was taken to recovery room in stable  condition.  All final counts were correct.  There were no complications noted.  LN/NUANCE  D:03/21/2019 T:03/21/2019 JOB:[id number]

## 2019-03-21 NOTE — H&P (Signed)
TOTAL KNEE ADMISSION H&P  Patient is being admitted for left total knee arthroplasty.  Subjective:  Chief Complaint:left knee pain.  HPI: [name], 63 y.o. male, has a history of pain and functional disability in the left knee due to arthritis and has failed non-surgical conservative treatments for greater than 12 weeks to includeNSAID's and/or analgesics, corticosteriod injections, viscosupplementation injections, flexibility and strengthening excercises and activity modification.  Onset of symptoms was gradual, starting 5 years ago with gradually worsening course since that time. The patient noted prior procedures on the knee to include  arthroscopy on the left knee(s).  Patient currently rates pain in the left knee(s) at 10 out of 10 with activity. Patient has night pain, worsening of pain with activity and weight bearing, pain that interferes with activities of daily living, pain with passive range of motion, crepitus and joint swelling.  Patient has evidence of subchondral sclerosis, periarticular osteophytes and joint space narrowing by imaging studies. There is no active infection.  Patient Active Problem List   Diagnosis Date Noted  . S/P carpal tunnel release right 02/22/19 03/08/2019  . Carpal tunnel syndrome of right wrist   . HTN (hypertension) 10/27/2018  . Prostate cancer ([place]) 05/30/2017  . Malignant neoplasm of prostate ([place]) 04/28/2017  . Chronic pain of both knees 02/21/2017  . Unilateral primary osteoarthritis, left knee 02/21/2017  . Unilateral primary osteoarthritis, right knee 02/21/2017   Past Medical History:  Diagnosis Date  . Arthritis   . Diabetes mellitus without complication ([place]) [id number]   Diabetes Type II  . HTN (hypertension) 10/27/2018  . Hyperlipidemia   . Internal hemorrhoids 2014   noted on colonoscopy  . Osteoarthritis of both knees   . Pre-diabetes    questionable  . Prostate cancer [facility])    prostate  . Sigmoid diverticulosis 2014   Mild,  noted on colonoscopy    Past Surgical History:  Procedure Laterality Date  . CARPAL TUNNEL RELEASE Right 02/22/2019   Procedure: CARPAL TUNNEL RELEASE;  Surgeon: Carole Civil, MD;  Location: [facility];  Service: Orthopedics;  Laterality: Right;  . COLONOSCOPY N/A 09/13/2012   Procedure: COLONOSCOPY;  Surgeon: Danie Binder, MD;  Location: [facility];  Service: Endoscopy;  Laterality: N/A;  9:30 AM  . HERNIA REPAIR     20 years ago  . KNEE ARTHROSCOPY Left    15 years ago  . LYMPHADENECTOMY Bilateral 05/30/2017   Procedure: LYMPHADENECTOMY, PELVIC EXTENDED;  Surgeon: [name], MD;  Location: [facility];  Service: Urology;  Laterality: Bilateral;  . ROBOT ASSISTED LAPAROSCOPIC RADICAL PROSTATECTOMY N/A 05/30/2017   Procedure: XI ROBOTIC ASSISTED LAPAROSCOPIC RADICAL PROSTATECTOMY LEVEL 3;  Surgeon: [name], MD;  Location: [facility];  Service: Urology;  Laterality: N/A;  . SHOULDER ARTHROSCOPY Right    15 years ago    Current Facility-Administered Medications  Medication Dose Route Frequency Provider Last Rate Last Admin  . ceFAZolin (ANCEF) IVPB 2g/100 mL premix  2 g Intravenous On Call to OR Pete Pelt, PA-C      . chlorhexidine (HIBICLENS) 4 % liquid 4 application  60 mL Topical Once Pete Pelt, PA-C      . lactated ringers infusion   Intravenous Continuous Barnet Glasgow, MD 50 mL/hr at 03/21/19 [id number] Restarted at 03/21/19 [id number]  . phenylephrine (NEOSYNEPHRINE) 10-0.9 MG/250ML-% infusion           . tranexamic acid (CYKLOKAPRON) IVPB 1,000 mg  1,000 mg Intravenous To OR Pete Pelt, PA-C  No Known Allergies  Social History   Tobacco Use  . Smoking status: Never Smoker  . Smokeless tobacco: Never Used  Substance Use Topics  . Alcohol use: No    Family History  Problem Relation Age of Onset  . Cancer Mother        small cell lung     Review of Systems  Musculoskeletal: Positive for joint swelling.  All other systems reviewed and are  negative.   Objective:  Physical Exam  Constitutional: He is oriented to person, place, and time. He appears well-developed and well-nourished.  HENT:  Head: Normocephalic and atraumatic.  Eyes: Pupils are equal, round, and reactive to light. EOM are normal.  Cardiovascular: Normal rate.  Respiratory: Effort normal.  Musculoskeletal:     Cervical back: Normal range of motion and neck supple.     Left knee: Effusion and bony tenderness present. Decreased range of motion. Tenderness present over the medial joint line and lateral joint line. Abnormal alignment and abnormal meniscus.  Neurological: He is alert and oriented to person, place, and time.  Skin: Skin is warm and dry.  Psychiatric: He has a normal mood and affect.    Vital signs in last 24 hours: Temp:  [98.8 F (37.1 C)] 98.8 F (37.1 C) (12/30 0543) Pulse Rate:  [67] 67 (12/30 0543) Resp:  [16] 16 (12/30 0543) BP: (125)/(83) 125/83 (12/30 0543) SpO2:  [98 %] 98 % (12/30 0543) Weight:  [88.6 kg] 88.6 kg (12/30 0622)  Labs:   Estimated body mass index is 28.84 kg/m as calculated from the following:   Height as of this encounter: 5\' 9"  (1.753 m).   Weight as of this encounter: 88.6 kg.   Imaging Review Plain radiographs demonstrate severe degenerative joint disease of the left knee(s). The overall alignment ismild varus. The bone quality appears to be good for age and reported activity level.      Assessment/Plan:  End stage arthritis, left knee   The patient history, physical examination, clinical judgment of the provider and imaging studies are consistent with end stage degenerative joint disease of the left knee(s) and total knee arthroplasty is deemed medically necessary. The treatment options including medical management, injection therapy arthroscopy and arthroplasty were discussed at length. The risks and benefits of total knee arthroplasty were presented and reviewed. The risks due to aseptic loosening,  infection, stiffness, patella tracking problems, thromboembolic complications and other imponderables were discussed. The patient acknowledged the explanation, agreed to proceed with the plan and consent was signed. Patient is being admitted for inpatient treatment for surgery, pain control, PT, OT, prophylactic antibiotics, VTE prophylaxis, progressive ambulation and ADL's and discharge planning. The patient is planning to be discharged home with home health services     Patient's anticipated LOS is less than 2 midnights, meeting these requirements: - Younger than 40 - Lives within 1 hour of care - Has a competent adult at home to recover with post-op recover - NO history of  - Chronic pain requiring opiods  - Diabetes  - Coronary Artery Disease  - Heart failure  - Heart attack  - Stroke  - DVT/VTE  - Cardiac arrhythmia  - Respiratory Failure/COPD  - Renal failure  - Anemia  - Advanced Liver disease

## 2019-03-21 NOTE — Evaluation (Signed)
Physical Therapy Evaluation Patient Details Name: [name] MRN: [id number] DOB: Nov 25, 1955 Today's Date: 03/21/2019   History of Present Illness  Patient is 63 y.o. male s/p Lt TKA on 03/21/19 with PMH significant for HTN, HLD, DM, OA, and prostate cancer.  Clinical Impression  TIMTHY NORDMANN is a 63 y.o. male POD 0 s/p Lt TKA. Patient reports independence with mobility at baseline. Patient is now limited by functional impairments (see PT problem list below) and requires min assist/guard for transfers and gait with RW. Patient was able to ambulate ~110 feet with RW and min guard. Patient instructed in exercise to facilitate ROM and circulation. Patient will benefit from continued skilled PT interventions to address impairments and progress towards PLOF. Acute PT will follow to progress mobility and stair training in preparation for safe discharge home.     Follow Up Recommendations Follow surgeon's recommendation for DC plan and follow-up therapies    Equipment Recommendations  (pt interested in 3-in-1, educated on where to purchase if they wish to get one)    Recommendations for Other Services       Precautions / Restrictions Precautions Precautions: Fall Restrictions Weight Bearing Restrictions: No      Mobility  Bed Mobility Overal bed mobility: Needs Assistance Bed Mobility: Supine to Sit     Supine to sit: Min assist;HOB elevated     General bed mobility comments: cues for use of bed rails and to walk LE's to EOB, assist required for Lt LE mobility  Transfers Overall transfer level: Needs assistance Equipment used: Rolling walker (2 wheeled) Transfers: Sit to/from Stand Sit to Stand: Min assist;From elevated surface         General transfer comment: cues for safe hand placement and technique with RW, min assist to rise and steady upon rising  Ambulation/Gait Ambulation/Gait assistance: Min guard Gait Distance (Feet): 110 Feet Assistive device: Rolling  walker (2 wheeled) Gait Pattern/deviations: Step-through pattern;Decreased stance time - left;Decreased stride length Gait velocity: slight decrease   General Gait Details: cues for safe hand placment, proximity, and to keep RW on ground. no overt LOB noted. min guard for safety.  Stairs            Wheelchair Mobility    Modified Rankin (Stroke Patients Only)       Balance Overall balance assessment: Needs assistance Sitting-balance support: Feet supported;No upper extremity supported Sitting balance-Leahy Scale: Good     Standing balance support: During functional activity;Bilateral upper extremity supported Standing balance-Leahy Scale: Fair                Pertinent Vitals/Pain Pain Assessment: 0-10 Pain Score: 1 (10/10 at start of session. pt in CPM; 1/10 at EOS) Pain Location: Lt knee Pain Descriptors / Indicators: Aching;Sore Pain Intervention(s): Limited activity within patient's tolerance;Monitored during session;Repositioned;Ice applied    Home Living Family/patient expects to be discharged to:: Private residence Living Arrangements: Spouse/significant other Available Help at Discharge: Family Type of Home: House Home Access: Stairs to enter Entrance Stairs-Rails: None Entrance Stairs-Number of Steps: patient has 4 steps in from garage and 3 at front, no rails (6+ stairs at back with hand rails) Home Layout: One level Home Equipment: Clinical cytogeneticist - 2 wheels;Cane - single point      Prior Function Level of Independence: Independent         Comments: works full time as Surveyor, mining   Dominant Hand: Right    Extremity/Trunk Assessment   Upper Extremity  Assessment Upper Extremity Assessment: Overall WFL for tasks assessed    Lower Extremity Assessment Lower Extremity Assessment: LLE deficits/detail LLE Deficits / Details: pt wtih good quad activation however unable to perform SLR without assist due to pain, immoblizer  applied for mobility LLE: Unable to fully assess due to pain;Unable to fully assess due to immobilization LLE Sensation: WNL LLE Coordination: WNL    Cervical / Trunk Assessment Cervical / Trunk Assessment: Normal  Communication   Communication: No difficulties  Cognition Arousal/Alertness: Awake/alert Behavior During Therapy: WFL for tasks assessed/performed Overall Cognitive Status: Within Functional Limits for tasks assessed         General Comments      Exercises Total Joint Exercises Ankle Circles/Pumps: AROM;Both;Seated;10 reps Quad Sets: AROM;5 reps;Supine;Left   Assessment/Plan    PT Assessment Patient needs continued PT services  PT Problem List Decreased strength;Decreased balance;Decreased range of motion;Decreased activity tolerance;Decreased mobility;Decreased knowledge of use of DME       PT Treatment Interventions DME instruction;Functional mobility training;Balance training;Patient/family education;Therapeutic activities;Gait training;Stair training;Therapeutic exercise    PT Goals (Current goals can be found in the Care Plan section)  Acute Rehab PT Goals Patient Stated Goal: to return home and get back to work PT Goal Formulation: With patient Time For Goal Achievement: 03/28/19 Potential to Achieve Goals: Good    Frequency 7X/week    AM-PAC PT "6 Clicks" Mobility  Outcome Measure Help needed turning from your back to your side while in a flat bed without using bedrails?: A Little Help needed moving from lying on your back to sitting on the side of a flat bed without using bedrails?: A Little Help needed moving to and from a bed to a chair (including a wheelchair)?: A Little Help needed standing up from a chair using your arms (e.g., wheelchair or bedside chair)?: A Little Help needed to walk in hospital room?: A Little Help needed climbing 3-5 steps with a railing? : A Little 6 Click Score: 18    End of Session Equipment Utilized During  Treatment: Gait belt Activity Tolerance: Patient tolerated treatment well Patient left: in chair;with family/visitor present;with call bell/phone within reach Nurse Communication: Mobility status PT Visit Diagnosis: Muscle weakness (generalized) (M62.81);Difficulty in walking, not elsewhere classified (R26.2)    Time: [id number] PT Time Calculation (min) (ACUTE ONLY): 25 min   Charges:   PT Evaluation $PT Eval Low Complexity: 1 Low PT Treatments $Gait Training: 8-22 mins        Gwynneth Albright PT, DPT Physical Therapist with [facility]  03/21/2019 3:36 PM

## 2019-03-21 NOTE — Anesthesia Procedure Notes (Signed)
Spinal  Patient location during procedure: OR Start time: 03/21/2019 7:15 AM End time: 03/21/2019 7:21 AM Staffing Performed: resident/CRNA  Resident/CRNA: Lavina Hamman, CRNA Preanesthetic Checklist Completed: patient identified, IV checked, site marked, risks and benefits discussed, surgical consent, monitors and equipment checked, pre-op evaluation and timeout performed Spinal Block Patient position: sitting Prep: DuraPrep Patient monitoring: heart rate, cardiac monitor, continuous pulse ox and blood pressure Approach: midline Location: L4-5 Injection technique: single-shot Needle Needle type: Sprotte  Needle gauge: 24 G Needle length: 9 cm Needle insertion depth: 7 cm Assessment Sensory level: T4 Additional Notes IV functioning, monitors applied to pt. Expiration date of kit checked and confirmed to be in date. Sterile prep and drape, hand hygiene and sterile gloved used. Pt was positioned and spine was prepped in sterile fashion. Skin was anesthetized with lidocaine. Free flow of clear CSF obtained prior to injecting local anesthetic into CSF x 1 attempt. Spinal needle aspirated freely following injection. Needle was carefully withdrawn, and pt tolerated procedure well. Loss of motor and sensory on exam post injection.

## 2019-03-22 ENCOUNTER — Encounter: Payer: Self-pay | Admitting: *Deleted

## 2019-03-22 DIAGNOSIS — M1712 Unilateral primary osteoarthritis, left knee: Secondary | ICD-10-CM | POA: Diagnosis not present

## 2019-03-22 LAB — CBC
HCT: 33.1 % — ABNORMAL LOW (ref 39.0–52.0)
Hemoglobin: 11.4 g/dL — ABNORMAL LOW (ref 13.0–17.0)
MCH: 30.9 pg (ref 26.0–34.0)
MCHC: 34.4 g/dL (ref 30.0–36.0)
MCV: 89.7 fL (ref 80.0–100.0)
Platelets: 121 10*3/uL — ABNORMAL LOW (ref 150–400)
RBC: 3.69 MIL/uL — ABNORMAL LOW (ref 4.22–5.81)
RDW: 11.9 % (ref 11.5–15.5)
WBC: 7.6 10*3/uL (ref 4.0–10.5)
nRBC: 0 % (ref 0.0–0.2)

## 2019-03-22 LAB — BASIC METABOLIC PANEL
Anion gap: 10 (ref 5–15)
BUN: 20 mg/dL (ref 8–23)
CO2: 24 mmol/L (ref 22–32)
Calcium: 8.3 mg/dL — ABNORMAL LOW (ref 8.9–10.3)
Chloride: 100 mmol/L (ref 98–111)
Creatinine, Ser: 0.85 mg/dL (ref 0.61–1.24)
GFR calc Af Amer: >60 mL/min (ref >60)
GFR calc non Af Amer: >60 mL/min (ref >60)
Glucose, Bld: 268 mg/dL — ABNORMAL HIGH (ref 70–99)
Potassium: 4.5 mmol/L (ref 3.5–5.1)
Sodium: 134 mmol/L — ABNORMAL LOW (ref 135–145)

## 2019-03-22 MED ORDER — GABAPENTIN 100 MG PO CAPS: 100.0000 mg | ORAL_CAPSULE | Freq: Three times a day (TID) | ORAL | 1 refills | Status: DC

## 2019-03-22 MED ORDER — OXYCODONE HCL 5 MG PO TABS: 5.0000 mg | ORAL_TABLET | ORAL | 0 refills | Status: DC | PRN

## 2019-03-22 MED ORDER — ONDANSETRON 4 MG PO TBDP: 4.0000 mg | ORAL_TABLET | Freq: Three times a day (TID) | ORAL | 0 refills | Status: DC | PRN

## 2019-03-22 MED ORDER — METHOCARBAMOL 500 MG PO TABS: 500.0000 mg | ORAL_TABLET | Freq: Four times a day (QID) | ORAL | 1 refills | Status: DC | PRN

## 2019-03-22 MED ORDER — ASPIRIN 325 MG PO TBEC: 325.0000 mg | DELAYED_RELEASE_TABLET | Freq: Two times a day (BID) | ORAL | 0 refills | Status: DC

## 2019-03-22 MED ORDER — HYDROMORPHONE HCL 2 MG PO TABS: 2.0000 mg | ORAL_TABLET | ORAL | 0 refills | Status: DC | PRN

## 2019-03-22 NOTE — Progress Notes (Signed)
Physical Therapy Treatment Patient Details Name: [name] MRN: [id number] DOB: 11-12-1955 Today's Date: 03/22/2019    History of Present Illness Patient is 63 y.o. male s/p Lt TKA on 03/21/19 with PMH significant for HTN, HLD, DM, OA, and prostate cancer.    PT Comments    POD # 1 am session Assist OOB to amb.  General bed mobility comments: demonstarted and instructed how to use belt loop to self assist LE off/onto bed.  General transfer comment: <25% VC's on proper walker to self placement and safety with turns as well as to extend out L LE prior to sit. General Gait Details: 25% VC's on proper upright posture and safety with turns using walker.  Then returned to room to perform some TE's following HEP handout.  Instructed on proper tech, freq as well as use of ICE.     Follow Up Recommendations  Follow surgeon's recommendation for DC plan and follow-up therapies     Equipment Recommendations       Recommendations for Other Services       Precautions / Restrictions Precautions Precautions: Fall Precaution Comments: instructed no pillow under knee Restrictions Weight Bearing Restrictions: No Other Position/Activity Restrictions: WBAT    Mobility  Bed Mobility Overal bed mobility: Needs Assistance Bed Mobility: Supine to Sit     Supine to sit: Supervision;Min guard     General bed mobility comments: demonstarted and instructed how to use belt loop to self assist LE off/onto bed  Transfers Overall transfer level: Needs assistance Equipment used: Rolling walker (2 wheeled) Transfers: Sit to/from [facility] Sit to Stand: Supervision Stand pivot transfers: Supervision;Min guard       General transfer comment: <25% VC's on proper walker to self placement and safety with turns as well as to extend out L LE prior to sit.  Ambulation/Gait Ambulation/Gait assistance: Supervision Gait Distance (Feet): 75 Feet Assistive device: Rolling walker (2  wheeled) Gait Pattern/deviations: Step-through pattern;Decreased stance time - left;Decreased stride length Gait velocity: decreased but functional   General Gait Details: 25% VC's on proper upright posture and safety with turns using walker   Stairs             Wheelchair Mobility    Modified Rankin (Stroke Patients Only)       Balance                                            Cognition Arousal/Alertness: Awake/alert Behavior During Therapy: WFL for tasks assessed/performed Overall Cognitive Status: Within Functional Limits for tasks assessed                                        Exercises   Total Knee Replacement TE's 10 reps B LE ankle pumps 10 reps towel squeezes 10 reps knee presses 10 reps heel slides   Followed by ICE     General Comments        Pertinent Vitals/Pain Pain Assessment: 0-10 Pain Score: 8  Pain Location: L knee Pain Descriptors / Indicators: Aching;Sore;Operative site guarding;Tightness;Tender;Discomfort Pain Intervention(s): Monitored during session;Premedicated before session;Repositioned;Ice applied    Home Living                      Prior Function  PT Goals (current goals can now be found in the care plan section) Progress towards PT goals: Progressing toward goals    Frequency    7X/week      PT Plan Current plan remains appropriate    Co-evaluation              AM-PAC PT "6 Clicks" Mobility   Outcome Measure  Help needed turning from your back to your side while in a flat bed without using bedrails?: A Little Help needed moving from lying on your back to sitting on the side of a flat bed without using bedrails?: A Little Help needed moving to and from a bed to a chair (including a wheelchair)?: A Little Help needed standing up from a chair using your arms (e.g., wheelchair or bedside chair)?: A Little Help needed to walk in hospital room?: A  Little Help needed climbing 3-5 steps with a railing? : A Little 6 Click Score: 18    End of Session Equipment Utilized During Treatment: Gait belt Activity Tolerance: Patient tolerated treatment well Patient left: in chair;with family/visitor present;with call bell/phone within reach Nurse Communication: Mobility status PT Visit Diagnosis: Muscle weakness (generalized) (M62.81);Difficulty in walking, not elsewhere classified (R26.2)     Time: [id number] PT Time Calculation (min) (ACUTE ONLY): 31 min  Charges:  $Gait Training: 8-22 mins $Therapeutic Exercise: 8-22 mins                     Rica Koyanagi  PTA Acute  Rehabilitation Services Pager      [phone] Office      [phone]

## 2019-03-22 NOTE — Discharge Summary (Signed)
Patient ID: [name] MRN: [id number] DOB/AGE: 1955-07-24 63 y.o.  Admit date: 03/21/2019 Discharge date: 03/22/2019  Admission Diagnoses:  Principal Problem:   Unilateral primary osteoarthritis, left knee Active Problems:   Status post total left knee replacement   Discharge Diagnoses:  Same  Past Medical History:  Diagnosis Date  . Arthritis   . Diabetes mellitus without complication ([place]) [id number]   Diabetes Type II  . HTN (hypertension) 10/27/2018  . Hyperlipidemia   . Internal hemorrhoids 2014   noted on colonoscopy  . Osteoarthritis of both knees   . Pre-diabetes    questionable  . Prostate cancer [facility])    prostate  . Sigmoid diverticulosis 2014   Mild, noted on colonoscopy    Surgeries: Procedure(s): LEFT TOTAL KNEE ARTHROPLASTY on 03/21/2019   Consultants:   Discharged Condition: Improved  Hospital Course: [name] is an 63 y.o. male who was admitted 03/21/2019 for operative treatment ofUnilateral primary osteoarthritis, left knee. Patient has severe unremitting pain that affects sleep, daily activities, and work/hobbies. After pre-op clearance the patient was taken to the operating room on 03/21/2019 and underwent  Procedure(s): LEFT TOTAL KNEE ARTHROPLASTY.    Patient was given perioperative antibiotics:  Anti-infectives (From admission, onward)   Start     Dose/Rate Route Frequency Ordered Stop   03/21/19 1330  ceFAZolin (ANCEF) IVPB 1 g/50 mL premix     1 g 100 mL/hr over 30 Minutes Intravenous Every 6 hours 03/21/19 1159 03/21/19 1907   03/21/19 0600  ceFAZolin (ANCEF) IVPB 2g/100 mL premix     2 g 200 mL/hr over 30 Minutes Intravenous On call to O.R. 03/21/19 0532 03/21/19 [id number]       Patient was given sequential compression devices, early ambulation, and chemoprophylaxis to prevent DVT.  Patient benefited maximally from hospital stay and there were no complications.    Recent vital signs:  Patient Vitals for the past 24 hrs:  BP  Temp Temp src Pulse Resp SpO2  03/22/19 0923 115/65 97.7 F (36.5 C) Oral 70 16 99 %  03/22/19 0816 111/60 -- -- 72 -- --  03/22/19 0453 110/64 98.1 F (36.7 C) Oral 81 16 99 %  03/22/19 0128 131/73 (!) 97.5 F (36.4 C) Oral 71 16 97 %  03/21/19 1957 115/71 97.6 F (36.4 C) Oral 62 16 100 %  03/21/19 1537 (!) 96/55 -- -- 68 17 96 %  03/21/19 1431 110/73 97.7 F (36.5 C) Oral 62 16 100 %  03/21/19 1327 126/67 (!) 97 F (36.1 C) Axillary 67 16 100 %     Recent laboratory studies:  Recent Labs    03/22/19 0443  WBC 7.6  HGB 11.4*  HCT 33.1*  PLT 121*  NA 134*  K 4.5  CL 100  CO2 24  BUN 20  CREATININE 0.85  GLUCOSE 268*  CALCIUM 8.3*     Discharge Medications:   Allergies as of 03/22/2019   No Known Allergies     Medication List    TAKE these medications   Aleve PM 220-25 MG Tabs Generic drug: Naproxen Sod-diphenhydrAMINE Take 1 tablet by mouth at bedtime.   aspirin 325 MG EC tablet Take 1 tablet (325 mg total) by mouth 2 (two) times daily at 10 am and 4 pm.   escitalopram 5 MG tablet Commonly known as: LEXAPRO Take 5 mg by mouth daily.   gabapentin 100 MG capsule Commonly known as: NEURONTIN Take 1 capsule (100 mg total) by mouth 3 (three) times  daily.   glimepiride 1 MG tablet Commonly known as: AMARYL Take 1 mg by mouth daily.   losartan-hydrochlorothiazide 50-12.5 MG tablet Commonly known as: HYZAAR TAKE 1 TABLET BY MOUTH EVERY DAY   methocarbamol 500 MG tablet Commonly known as: ROBAXIN Take 1 tablet (500 mg total) by mouth every 6 (six) hours as needed for muscle spasms.   naproxen sodium 220 MG tablet Commonly known as: ALEVE Take 220-440 mg by mouth daily as needed (pain).   ondansetron 4 MG disintegrating tablet Commonly known as: Zofran ODT Take 1 tablet (4 mg total) by mouth every 8 (eight) hours as needed for nausea or vomiting.   oxyCODONE 5 MG immediate release tablet Commonly known as: Oxy IR/ROXICODONE Take 1-2 tablets  (5-10 mg total) by mouth every 4 (four) hours as needed for moderate pain (pain score 4-6).   rosuvastatin 20 MG tablet Commonly known as: CRESTOR TAKE 1 TABLET BY MOUTH EVERY DAY   temazepam 15 MG capsule Commonly known as: RESTORIL Take 15 mg by mouth at bedtime as needed for sleep.   Tyler Aas FlexTouch 100 UNIT/ML Sopn FlexTouch Pen Generic drug: insulin degludec Inject 30 Units into the skin daily.   Vascepa 1 g capsule Generic drug: icosapent Ethyl Take 2 capsules (2 g total) by mouth 2 (two) times daily.            Durable Medical Equipment  (From admission, onward)         Start     Ordered   03/21/19 1200  DME 3 n 1  Once     03/21/19 1200   03/21/19 1200  DME Walker rolling  Once    Question:  Patient needs a walker to treat with the following condition  Answer:  Status post total left knee replacement   03/21/19 1200          Diagnostic Studies: DG Knee Left Port  Result Date: 03/21/2019 CLINICAL DATA:  63 year old male status post left knee arthroplasty. EXAM: PORTABLE LEFT KNEE - 1-2 VIEW COMPARISON:  Knee radiograph dated 02/26/2019. FINDINGS: There is a total left knee arthroplasty. The arthroplasty components appear intact and in anatomic alignment. There is no acute fracture or dislocation. Postsurgical changes including air and fluid within the joint space and anterior knee cutaneous staples. IMPRESSION: Status post total left knee arthroplasty. No immediate complication. Electronically Signed   By: Anner Crete M.D.   On: 03/21/2019 10:22   XR Knee 1-2 Views Left  Result Date: 02/26/2019 2 views of the left knee show severe end-stage arthritis with complete loss of medial and lateral joint space.  There are large periarticular osteophytes in all 3 compartments consistent with severe osteoarthritis.   Disposition: Discharge disposition: 01-Home or Self Care         Follow-up Information    Mcarthur Rossetti, MD Follow up in 2  week(s).   Specialty: Orthopedic Surgery Contact information: [address] [place] [address] [phone]            Signed: Mcarthur Rossetti 03/22/2019, 12:26 PM

## 2019-03-22 NOTE — Progress Notes (Signed)
Subjective: 1 Day Post-Op Procedure(s) (LRB): LEFT TOTAL KNEE ARTHROPLASTY (Left) Patient reports pain as moderate.    Objective: Vital signs in last 24 hours: Temp:  [97 F (36.1 C)-98.1 F (36.7 C)] 97.7 F (36.5 C) (12/31 0923) Pulse Rate:  [62-81] 70 (12/31 0923) Resp:  [16-17] 16 (12/31 0923) BP: (96-131)/(55-73) 115/65 (12/31 0923) SpO2:  [96 %-100 %] 99 % (12/31 0923)  Intake/Output from previous day: 12/30 0701 - 12/31 0700 In: 2923.5 [P.O.:420; I.V.:2503.5] Out: 2675 [Urine:2550; Blood:125] Intake/Output this shift: Total I/O In: 480 [P.O.:360; I.V.:120] Out: 400 [Urine:400]  Recent Labs    03/22/19 0443  HGB 11.4*   Recent Labs    03/22/19 0443  WBC 7.6  RBC 3.69*  HCT 33.1*  PLT 121*   Recent Labs    03/22/19 0443  NA 134*  K 4.5  CL 100  CO2 24  BUN 20  CREATININE 0.85  GLUCOSE 268*  CALCIUM 8.3*   No results for input(s): LABPT, INR in the last 72 hours.  Sensation intact distally Intact pulses distally Dorsiflexion/Plantar flexion intact Incision: dressing C/D/I No cellulitis present Compartment soft   Assessment/Plan: 1 Day Post-Op Procedure(s) (LRB): LEFT TOTAL KNEE ARTHROPLASTY (Left) Up with therapy Discharge home with home health    Patient's anticipated LOS is less than 2 midnights, meeting these requirements: - Younger than 38 - Lives within 1 hour of care - Has a competent adult at home to recover with post-op recover - NO history of  - Chronic pain requiring opiods  - Diabetes  - Coronary Artery Disease  - Heart failure  - Heart attack  - Stroke  - DVT/VTE  - Cardiac arrhythmia  - Respiratory Failure/COPD  - Renal failure  - Anemia  - Advanced Liver disease       Mcarthur Rossetti 03/22/2019, 12:23 PM

## 2019-03-22 NOTE — TOC Initial Note (Signed)
Transition of Care [facility]) - Initial/Assessment Note    Patient Details  Name: [name] MRN: [id number] Date of Birth: 1955-09-17  [facility] CM/SW Contact:    Leeroy Cha, RN Phone Number: 03/22/2019, 1:13 PM  Clinical Narrative:                 Has needed equipment at home [id number]  Expected Discharge Plan: [place] Barriers to Discharge: No Barriers Identified   Patient Goals and CMS Choice        Expected Discharge Plan and Services Expected Discharge Plan: [place]   Discharge Planning Services: CM Consult Post Acute Care Choice: [place] arrangements for the past 2 months: Single Family Home Expected Discharge Date: 03/22/19                         HH Arranged: PT HH Agency: [facility] [employer]) Date [place]: 03/22/19 Time HH Agency Contacted: 1200 Representative spoke with at [place]: [place] Arrangements/Services Living arrangements for the past 2 months: [place] Lives with:: Spouse Patient language and need for interpreter reviewed:: No Do you feel safe going back to the place where you live?: Yes      Need for Family Participation in Patient Care: Yes (Comment) Care giver support system in place?: Yes (comment)   Criminal Activity/Legal Involvement Pertinent to Current Situation/Hospitalization: No - Comment as needed  Activities of Daily Living Home Assistive Devices/Equipment: Eyeglasses ADL Screening (condition at time of admission) Patient's cognitive ability adequate to safely complete daily activities?: Yes Is the patient deaf or have difficulty hearing?: No Does the patient have difficulty seeing, even when wearing glasses/contacts?: No Does the patient have difficulty concentrating, remembering, or making decisions?: No Patient able to express need for assistance with ADLs?: Yes Does the patient have  difficulty dressing or bathing?: No Independently performs ADLs?: Yes (appropriate for developmental age) Does the patient have difficulty walking or climbing stairs?: Yes Weakness of Legs: Left Weakness of Arms/Hands: None  Permission Sought/Granted                  Emotional Assessment Appearance:: Appears stated age     Orientation: : Oriented to Self, Oriented to Place, Oriented to  Time, Oriented to Situation Alcohol / Substance Use: Not Applicable Psych Involvement: No (comment)  Admission diagnosis:  Status post total left knee replacement [Z96.652] Patient Active Problem List   Diagnosis Date Noted  . Status post total left knee replacement 03/21/2019  . S/P carpal tunnel release right 02/22/19 03/08/2019  . Carpal tunnel syndrome of right wrist   . HTN (hypertension) 10/27/2018  . Prostate cancer ([place]) 05/30/2017  . Malignant neoplasm of prostate ([place]) 04/28/2017  . Chronic pain of both knees 02/21/2017  . Unilateral primary osteoarthritis, left knee 02/21/2017  . Unilateral primary osteoarthritis, right knee 02/21/2017   PCP:  [name], MD Pharmacy:   [id number] [place] [place] [address] Phone: [phone] Fax: [phone]     Social Determinants of Health (SDOH) Interventions    Readmission Risk Interventions No flowsheet data found.

## 2019-03-22 NOTE — Progress Notes (Signed)
Physical Therapy Treatment Patient Details Name: [name] MRN: [id number] DOB: 1956/03/15 Today's Date: 03/22/2019    History of Present Illness Patient is 63 y.o. male s/p Lt TKA on 03/21/19 with PMH significant for HTN, HLD, DM, OA, and prostate cancer.    PT Comments    POD # 1 pm session Spouse present.  Assisted OOB.  General bed mobility comments: demonstarted and instructed how to use belt loop to self assist LE off/onto bed.  General transfer comment: <25% VC's on proper walker to self placement and safety with turns as well as to extend out L LE prior to sit. General Gait Details: 25% VC's on proper upright posture and safety with turns using walker.  General stair comments: with spouse present for "hands on" instruction 7 steps one rail/one crutch with 50% VC's on proper sequencing and safety.  Pt's pain increased to 10 after session.  Not yet time for more oral so RN giving IV pain meds.  Pt hopes to D/C to home later today once pain is managed.    Follow Up Recommendations  Follow surgeon's recommendation for DC plan and follow-up therapies     Equipment Recommendations       Recommendations for Other Services       Precautions / Restrictions Precautions Precautions: Fall Precaution Comments: instructed no pillow under knee Restrictions Weight Bearing Restrictions: No Other Position/Activity Restrictions: WBAT    Mobility  Bed Mobility Overal bed mobility: Needs Assistance Bed Mobility: Supine to Sit;Sit to Supine     Supine to sit: Supervision;Min guard Sit to supine: Supervision;Min guard   General bed mobility comments: demonstarted and instructed how to use belt loop to self assist LE off/onto bed  Transfers Overall transfer level: Needs assistance Equipment used: Rolling walker (2 wheeled) Transfers: Sit to/from [facility] Sit to Stand: Supervision Stand pivot transfers: Supervision;Min guard       General transfer comment:  <25% VC's on proper walker to self placement and safety with turns as well as to extend out L LE prior to sit.  Ambulation/Gait Ambulation/Gait assistance: Supervision Gait Distance (Feet): 55 Feet Assistive device: Rolling walker (2 wheeled) Gait Pattern/deviations: Step-through pattern;Decreased stance time - left;Decreased stride length Gait velocity: decreased but functional   General Gait Details: 25% VC's on proper upright posture and safety with turns using walker   Stairs Stairs: Yes Stairs assistance: Min guard;Min assist Stair Management: One rail Left;Step to pattern;Forwards;With crutches Number of Stairs: 7 General stair comments: with spouse present for "hands on" instruction 7 steps one rail/one crutch with 50% VC's on proper sequencing and safety   Wheelchair Mobility    Modified Rankin (Stroke Patients Only)       Balance                                            Cognition Arousal/Alertness: Awake/alert Behavior During Therapy: WFL for tasks assessed/performed Overall Cognitive Status: Within Functional Limits for tasks assessed                                        Exercises      General Comments        Pertinent Vitals/Pain Pain Assessment: 0-10 Pain Score: 8  Pain Location: L knee Pain Descriptors / Indicators:  Aching;Sore;Operative site guarding;Tightness;Tender;Discomfort Pain Intervention(s): Monitored during session;Premedicated before session;Repositioned;Ice applied    Home Living                      Prior Function            PT Goals (current goals can now be found in the care plan section) Progress towards PT goals: Progressing toward goals    Frequency    7X/week      PT Plan Current plan remains appropriate    Co-evaluation              AM-PAC PT "6 Clicks" Mobility   Outcome Measure  Help needed turning from your back to your side while in a flat bed without  using bedrails?: A Little Help needed moving from lying on your back to sitting on the side of a flat bed without using bedrails?: A Little Help needed moving to and from a bed to a chair (including a wheelchair)?: A Little Help needed standing up from a chair using your arms (e.g., wheelchair or bedside chair)?: A Little Help needed to walk in hospital room?: A Little Help needed climbing 3-5 steps with a railing? : A Little 6 Click Score: 18    End of Session Equipment Utilized During Treatment: Gait belt Activity Tolerance: Patient tolerated treatment well Patient left: in bed Nurse Communication: Mobility status PT Visit Diagnosis: Muscle weakness (generalized) (M62.81);Difficulty in walking, not elsewhere classified (R26.2)     Time: [id number] PT Time Calculation (min) (ACUTE ONLY): 25 min  Charges:  $Gait Training: 8-22 mins $Therapeutic Activity: 8-22 mins                     Rica Koyanagi  PTA Acute  Rehabilitation Services Pager      [phone] Office      [phone]

## 2019-03-22 NOTE — Discharge Instructions (Signed)

## 2019-03-22 NOTE — Progress Notes (Signed)
Pt and pt's Wife provided with d/c instructions. After discussing the pt's plan of care, the pt reported no further questions or concerns.

## 2019-03-26 ENCOUNTER — Telehealth: Payer: Self-pay | Admitting: Orthopaedic Surgery

## 2019-03-26 NOTE — Telephone Encounter (Signed)
Verbal order given  

## 2019-03-26 NOTE — Telephone Encounter (Signed)
Maria from [place] at [facility] would like verbal orders for PT. 1x wk 1wk 3x for 2wks. Her call back number is [phone]

## 2019-03-27 ENCOUNTER — Telehealth: Payer: Self-pay | Admitting: Orthopaedic Surgery

## 2019-03-27 MED ORDER — OXYCODONE HCL 5 MG PO TABS: 5.0000 mg | ORAL_TABLET | Freq: Four times a day (QID) | ORAL | 0 refills | Status: DC | PRN

## 2019-03-27 NOTE — Telephone Encounter (Signed)
Please advise 

## 2019-03-27 NOTE — Telephone Encounter (Signed)
Patient's wife Diane called  Advised patient need Rx refilled Oxycodone. The number to contact patient is [phone] or [phone]

## 2019-03-29 ENCOUNTER — Telehealth: Payer: Self-pay | Admitting: Orthopaedic Surgery

## 2019-03-29 NOTE — Telephone Encounter (Signed)
Patient's wife called. Despite PT and medication, the patient's leg is still extremely swollen. They want to know if it's normal.   Call back number: [phone]

## 2019-03-29 NOTE — Telephone Encounter (Signed)
Please advise 

## 2019-03-29 NOTE — Telephone Encounter (Signed)
I spoke with him and the therapist.

## 2019-04-02 ENCOUNTER — Telehealth: Payer: Self-pay | Admitting: Orthopaedic Surgery

## 2019-04-02 NOTE — Telephone Encounter (Signed)
[name] from [place] at [facility] called. She would like verbal orders for PT 4x wk 1wk. Her call back number is [phone]

## 2019-04-02 NOTE — Telephone Encounter (Signed)
Verbal order given  

## 2019-04-04 ENCOUNTER — Ambulatory Visit (INDEPENDENT_AMBULATORY_CARE_PROVIDER_SITE_OTHER): Payer: Self-pay | Admitting: Orthopaedic Surgery

## 2019-04-04 ENCOUNTER — Other Ambulatory Visit: Payer: Self-pay

## 2019-04-04 ENCOUNTER — Encounter: Payer: Self-pay | Admitting: Orthopaedic Surgery

## 2019-04-04 DIAGNOSIS — Z96652 Presence of left artificial knee joint: Secondary | ICD-10-CM

## 2019-04-04 MED ORDER — OXYCODONE HCL 5 MG PO TABS: 5.0000 mg | ORAL_TABLET | Freq: Four times a day (QID) | ORAL | 0 refills | Status: DC | PRN

## 2019-04-04 NOTE — Progress Notes (Signed)
The patient is now 2 weeks status post a left total knee arthroplasty.  He is doing well but in need of pain medications which is appropriate.  On exam I remove the staples in place Steri-Strips.  His extension lacks full extension by about 3 degrees but I can only flex him to about 75 to 80 degrees.  His calf is soft.  His leg is swollen.  He does have a history of lymphedema as well.  Has been wearing compressive garments.  At this point he can transition to Aleve twice a day.  I did refill his oxycodone.  We need to get him an outpatient physical therapy for aggressive range of motion of his left knee.  All question concerns were answered and addressed.  Would like to see him back in 4 weeks for repeat exam but no x-rays are needed.

## 2019-04-09 ENCOUNTER — Telehealth: Payer: Self-pay

## 2019-04-09 DIAGNOSIS — E782 Mixed hyperlipidemia: Secondary | ICD-10-CM

## 2019-04-09 NOTE — Telephone Encounter (Signed)
Did he get the discount card? Also ask him to sign up for my chart

## 2019-04-09 NOTE — Telephone Encounter (Signed)
Pt wife called and said she went to pick up patient Vascepa today and it was 300$ and she said she is giving him a OTC fish oil and wanted to know if that's okay

## 2019-04-10 ENCOUNTER — Other Ambulatory Visit: Payer: Self-pay

## 2019-04-10 ENCOUNTER — Encounter (HOSPITAL_COMMUNITY): Payer: Self-pay

## 2019-04-10 ENCOUNTER — Ambulatory Visit (HOSPITAL_COMMUNITY): Payer: 59 | Attending: Orthopaedic Surgery

## 2019-04-10 DIAGNOSIS — M25562 Pain in left knee: Secondary | ICD-10-CM | POA: Diagnosis not present

## 2019-04-10 DIAGNOSIS — R29898 Other symptoms and signs involving the musculoskeletal system: Secondary | ICD-10-CM | POA: Diagnosis present

## 2019-04-10 DIAGNOSIS — M25662 Stiffness of left knee, not elsewhere classified: Secondary | ICD-10-CM | POA: Insufficient documentation

## 2019-04-10 DIAGNOSIS — G8929 Other chronic pain: Secondary | ICD-10-CM | POA: Insufficient documentation

## 2019-04-10 NOTE — Therapy (Signed)
[place], [place], [address] Phone: [phone]   Fax:  [phone]  Physical Therapy Evaluation  Patient Details  Name: [name] MRN: [id number] Date of Birth: [birth year] Referring Provider (PT): Jean Rosenthal, MD   Encounter Date: 04/10/2019  PT End of Session - 04/10/19 1125    Visit Number  1    Number of Visits  18    Date for PT Re-Evaluation  05/22/19    Authorization Type  [employer]    Authorization Time Period  04/10/19 to 05/22/19    PT Start Time  1030    PT Stop Time  1115    PT Time Calculation (min)  45 min    Activity Tolerance  Patient tolerated treatment well;No increased pain    Behavior During Therapy  WFL for tasks assessed/performed       Past Medical History:  Diagnosis Date  . Arthritis   . Diabetes mellitus without complication ([place]) [id number]   Diabetes Type II  . HTN (hypertension) 10/27/2018  . Hyperlipidemia   . Internal hemorrhoids 2014   noted on colonoscopy  . Osteoarthritis of both knees   . Pre-diabetes    questionable  . Prostate cancer [facility])    prostate  . Sigmoid diverticulosis 2014   Mild, noted on colonoscopy    Past Surgical History:  Procedure Laterality Date  . CARPAL TUNNEL RELEASE Right 02/22/2019   Procedure: CARPAL TUNNEL RELEASE;  Surgeon: Carole Civil, MD;  Location: [facility];  Service: Orthopedics;  Laterality: Right;  . COLONOSCOPY N/A 09/13/2012   Procedure: COLONOSCOPY;  Surgeon: Danie Binder, MD;  Location: [facility];  Service: Endoscopy;  Laterality: N/A;  9:30 AM  . HERNIA REPAIR     20 years ago  . KNEE ARTHROSCOPY Left    15 years ago  . LYMPHADENECTOMY Bilateral 05/30/2017   Procedure: LYMPHADENECTOMY, PELVIC EXTENDED;  Surgeon: [name], MD;  Location: [facility];  Service: Urology;  Laterality: Bilateral;  . ROBOT ASSISTED LAPAROSCOPIC RADICAL PROSTATECTOMY N/A 05/30/2017   Procedure: XI ROBOTIC ASSISTED LAPAROSCOPIC  RADICAL PROSTATECTOMY LEVEL 3;  Surgeon: [name], MD;  Location: [facility];  Service: Urology;  Laterality: N/A;  . SHOULDER ARTHROSCOPY Right    15 years ago  . TOTAL KNEE ARTHROPLASTY Left 03/21/2019   Procedure: LEFT TOTAL KNEE ARTHROPLASTY;  Surgeon: Mcarthur Rossetti, MD;  Location: [facility];  Service: Orthopedics;  Laterality: Left;    There were no vitals filed for this visit.   Subjective Assessment - 04/10/19 1036    Subjective  Pt reports L TKA on 03/21/19 with HHPT for 2 weeks after where things went great. Pt reports mostly limited with flexiblity. Pt reports 8/10 to 10/10 pain with bending his knee. Pt reports he has to walk down steps one foot at a time with step-to pattern. Pt denies falls since surgery. Pt reports no AD use. 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 Pt reports muscle relaxor and oxycodone help with pain relief. Pt reports had lymph nodes removed due to cancer a few years ago and is having issues with swelling in his L leg since surgery. Pt reports he tried compression stockings up to his knee, but the fluid would pool in his thigh so he stopped wearing them.    How long can you sit comfortably?  5-10 minutes    How long can  you stand comfortably?  no issues    How long can you walk comfortably?  no issues    Patient Stated Goals  "I want you to get this thing to bend without hurting"    Currently in Pain?  Yes    Pain Score  2     Pain Location  Knee    Pain Orientation  Left    Pain Descriptors / Indicators  Aching;Sore    Pain Type  Surgical pain    Pain Onset  1 to 4 weeks ago    Pain Frequency  Constant    Aggravating Factors   sitting with constant bend, flexing knee    Pain Relieving Factors  muscle relaxors, oxycodone    Effect of Pain on Daily Activities  limited         [facility] PT Assessment - 04/10/19 0001      Assessment   Medical Diagnosis  L TKA    Referring Provider (PT)   Jean Rosenthal, MD    Onset Date/Surgical Date  03/21/19    Next MD Visit  February 2021    Prior Therapy  Yes, acute care and HHPT after surgery      Precautions   Precautions  None      Restrictions   Weight Bearing Restrictions  No      Balance Screen   Has the patient fallen in the past 6 months  No    Has the patient had a decrease in activity level because of a fear of falling?   No    Is the patient reluctant to leave their home because of a fear of falling?   No      Prior Function   Level of Independence  Independent    Vocation Requirements  pastor    Leisure  yardwork, grandkids      Cognition   Overall Cognitive Status  Within Functional Limits for tasks assessed      Observation/Other Assessments   Observations  entire LLE slightly swollen circumferentially compared to RLE, induration in mm from thigh to calf but pt denies pain    Skin Integrity  clean, dry incision, no drainage, no redness, no warmth or signs of infection    Focus on Therapeutic Outcomes (FOTO)   37% limited      Sensation   Light Touch  Appears Intact      Functional Tests   Functional tests  Sit to Stand      Sit to Stand   Comments  5x STS: 11.2 sec, no UE assist, weight-shifted to RLE, LLE extended      ROM / Strength   AROM / PROM / Strength  AROM;Strength      AROM   AROM Assessment Site  Knee    Right/Left Knee  Left    Left Knee Extension  4   from extension   Left Knee Flexion  78      Strength   Strength Assessment Site  Hip;Knee;Ankle    Right Knee Flexion  5/5    Right Knee Extension  5/5    Left Knee Flexion  5/5    Left Knee Extension  5/5    Right Ankle Dorsiflexion  5/5    Left Ankle Dorsiflexion  5/5      Palpation   Patella mobility  good mobility laterally, slightly hypomobile superior-inferior    Palpation comment  Pt tender to palpation at medial joint line and throughout quad, especially along  lateral side      Ambulation/Gait   Ambulation/Gait   Yes    Ambulation/Gait Assistance  7: Independent    Assistive device  None    Gait Pattern  Within Functional Limits;Decreased hip/knee flexion - left;Decreased dorsiflexion - left      High Level Balance   High Level Balance Comments  Pt able to step over 6" hurdle, weave in/out of cones, stop and turn without unsteadiness noted and no L knee buckling noted, no AD used                Objective measurements completed on examination: See above findings.        PT Education - 04/10/19 1156    Education Details  Assessment findings, FOTO findings, established HEP, exercise technique    Person(s) Educated  Patient    Methods  Explanation;Demonstration;Verbal cues    Comprehension  Verbalized understanding;Returned demonstration       PT Short Term Goals - 04/10/19 1259      PT SHORT TERM GOAL #1   Title  Pt will perform HEP at least 2x/week to improve knee strength and AROM needed for functional activity.    Time  4    Period  Weeks    Status  New    Target Date  05/08/19      PT SHORT TERM GOAL #2   Title  Pt will demonstrate L knee AROM 0-90 degrees to improve gait mechanics and ability to navigate community.    Time  4    Period  Weeks    Status  New        PT Long Term Goals - 04/10/19 1301      PT LONG TERM GOAL #1   Title  Pt will demonstrate L knee AROM 0-120 degrees to improve gait mechanics and ability to navigate community.    Time  6    Period  Weeks    Status  New    Target Date  05/22/19      PT LONG TERM GOAL #2   Title  Pt will self report 5/10 pain at worst with knee flexion to improve ability to perform functional activities.    Time  6    Period  Weeks    Status  New      PT LONG TERM GOAL #3   Title  Pt will improve FOTO score to 10% limited to represent significant improvement in ability to complete household chores with less pain.    Time  6    Period  Weeks    Status  New      PT LONG TERM GOAL #4   Title  Pt will tolerate  lymphedema evaluation, if appropriate.    Time  6    Period  Weeks    Status  New             Plan - 04/10/19 1127    Clinical Impression Statement  Pt is a pleasant 64 YO male s/p L TKA on 03/21/19. Pt presents with deficits in knee ROM, strength, transfers, ambulation, balance, and increased pain with mobility. Pt performs STS transfers with LLE extended and weight-shift to RLE for comfort. Pt demonstrates decreased L knee flexion in swing and decreased heel-toe pattern without an AD and no loss of balance. Pt would benefit from skilled PT interventions to improve deficits noted, establish HEP, reduce pain, and return to PLOF.    Personal Factors and Comorbidities  Comorbidity  1    Comorbidities  prostate CA with lymphadenectomy in pelvis/BLE    Examination-Activity Limitations  Bend;Squat;Stairs    Examination-Participation Restrictions  Church;Yard Work    Stability/Clinical Decision Making  Stable/Uncomplicated    Designer, jewellery  Low    Rehab Potential  Good    PT Frequency  3x / week    PT Duration  6 weeks    PT Treatment/Interventions  ADLs/Self Care Home Management;Aquatic Therapy;Biofeedback;DME Instruction;Gait training;Stair training;Functional mobility training;Therapeutic activities;Therapeutic exercise;Balance training;Neuromuscular re-education;Patient/family education;Orthotic Fit/Training;Manual techniques;Manual lymph drainage;Compression bandaging;Scar mobilization;Passive range of motion;Dry needling;Taping;Joint Manipulations    PT Next Visit Plan  Review goals and HEP. Focus on increasing L knee flexion through ROM and strengthening exercises.Use manual as needed to increase ROM. Consider lymphadema eval if swelling continues to be a problem.    PT Home Exercise Plan  Eval: continue HHPT exercises, add seated heel slides with RLE overpressure    Consulted and Agree with Plan of Care  Patient       Patient will benefit from skilled therapeutic  intervention in order to improve the following deficits and impairments:  Abnormal gait, Decreased balance, Decreased range of motion, Decreased strength, Increased edema, Increased fascial restricitons, Impaired flexibility, Improper body mechanics, Pain  Visit Diagnosis: Chronic pain of left knee  Stiffness of left knee, not elsewhere classified  Other symptoms and signs involving the musculoskeletal system     Problem List Patient Active Problem List   Diagnosis Date Noted  . Status post total left knee replacement 03/21/2019  . S/P carpal tunnel release right 02/22/19 03/08/2019  . Carpal tunnel syndrome of right wrist   . HTN (hypertension) 10/27/2018  . Prostate cancer ([place]) 05/30/2017  . Malignant neoplasm of prostate ([place]) 04/28/2017  . Chronic pain of both knees 02/21/2017  . Unilateral primary osteoarthritis, left knee 02/21/2017  . Unilateral primary osteoarthritis, right knee 02/21/2017    Talbot Grumbling PT, DPT 04/10/19, 1:18 PM [facility] [address] [place], [place], [address] Phone: [phone]   Fax:  [phone]  Name: DEMAURION MICHALOWSKI MRN: [id number] Date of Birth: Oct 27, 1955

## 2019-04-10 NOTE — Addendum Note (Signed)
Addended by: Kela Millin on: 04/10/2019 10:05 AM   Modules accepted: Orders

## 2019-04-10 NOTE — Telephone Encounter (Signed)
PCP labs 10/06/2018: cholesterol 189, triglycerides 1000, HDL 26, LDL unable to be calculated. Non- HDL 163.  Super high TG. WIll add labs to check now.   Reducing starch intake and cheese, fried foods and foods rich in calories will help.

## 2019-04-10 NOTE — Telephone Encounter (Signed)
LVM with details.

## 2019-04-10 NOTE — Telephone Encounter (Signed)
Spoke with pharmacy gave them the co pay card and ut only brought it down to 200$; Wife says if you think he really needs to be on it she will pay it, but she did pick up a OTC 2400 G and shes giving that to him until they meet their pharmacy deductible;  Also she wants to know if you can put some orders in for him to get his triglycerides checked she said that he hasnt had that done in a while

## 2019-04-10 NOTE — Telephone Encounter (Signed)
Please see above. Orders placed

## 2019-04-12 ENCOUNTER — Other Ambulatory Visit: Payer: Self-pay

## 2019-04-12 ENCOUNTER — Ambulatory Visit (HOSPITAL_COMMUNITY): Payer: 59

## 2019-04-12 ENCOUNTER — Encounter (HOSPITAL_COMMUNITY): Payer: Self-pay

## 2019-04-12 DIAGNOSIS — R29898 Other symptoms and signs involving the musculoskeletal system: Secondary | ICD-10-CM

## 2019-04-12 DIAGNOSIS — G8929 Other chronic pain: Secondary | ICD-10-CM

## 2019-04-12 DIAGNOSIS — M25562 Pain in left knee: Secondary | ICD-10-CM | POA: Diagnosis not present

## 2019-04-12 DIAGNOSIS — M25662 Stiffness of left knee, not elsewhere classified: Secondary | ICD-10-CM

## 2019-04-12 NOTE — Therapy (Signed)
[place], [place], [address] Phone: [phone]   Fax:  [phone]  Physical Therapy Treatment  Patient Details  Name: [name] MRN: [id number] Date of Birth: 08-25-1955 Referring Provider (PT): Jean Rosenthal, MD   Encounter Date: 04/12/2019  PT End of Session - 04/12/19 1715    Visit Number  2    Number of Visits  18    Date for PT Re-Evaluation  05/22/19    Authorization Type  [employer]    Authorization Time Period  04/10/19 to 05/22/19    Authorization - Visit Number  2    Authorization - Number of Visits  30    PT Start Time  [id number]    PT Stop Time  [id number]    PT Time Calculation (min)  45 min    Activity Tolerance  Patient tolerated treatment well;No increased pain    Behavior During Therapy  WFL for tasks assessed/performed       Past Medical History:  Diagnosis Date  . Arthritis   . Diabetes mellitus without complication ([place]) [id number]   Diabetes Type II  . HTN (hypertension) 10/27/2018  . Hyperlipidemia   . Internal hemorrhoids 2014   noted on colonoscopy  . Osteoarthritis of both knees   . Pre-diabetes    questionable  . Prostate cancer [facility])    prostate  . Sigmoid diverticulosis 2014   Mild, noted on colonoscopy    Past Surgical History:  Procedure Laterality Date  . CARPAL TUNNEL RELEASE Right 02/22/2019   Procedure: CARPAL TUNNEL RELEASE;  Surgeon: Carole Civil, MD;  Location: [facility];  Service: Orthopedics;  Laterality: Right;  . COLONOSCOPY N/A 09/13/2012   Procedure: COLONOSCOPY;  Surgeon: Danie Binder, MD;  [facility];  Service: Endoscopy;  Laterality: N/A;  9:30 AM  . HERNIA REPAIR     20 years ago  . KNEE ARTHROSCOPY Left    15 years ago  . LYMPHADENECTOMY Bilateral 05/30/2017   Procedure: LYMPHADENECTOMY, PELVIC EXTENDED;  Surgeon: [name], MD;  [facility];  Service: Urology;  Laterality: Bilateral;  . ROBOT ASSISTED LAPAROSCOPIC  RADICAL PROSTATECTOMY N/A 05/30/2017   Procedure: XI ROBOTIC ASSISTED LAPAROSCOPIC RADICAL PROSTATECTOMY LEVEL 3;  Surgeon: [name], MD;  Location: [facility];  Service: Urology;  Laterality: N/A;  . SHOULDER ARTHROSCOPY Right    15 years ago  . TOTAL KNEE ARTHROPLASTY Left 03/21/2019   Procedure: LEFT TOTAL KNEE ARTHROPLASTY;  Surgeon: Mcarthur Rossetti, MD;  [facility];  Service: Orthopedics;  Laterality: Left;    There were no vitals filed for this visit.  Subjective Assessment - 04/12/19 1622    Subjective  Pt reports pain minimal in knee.  Has been complaint with HEP daily and hasn't iced knee but has been elevating.    Patient Stated Goals  "I want you to get this thing to bend without hurting"    Currently in Pain?  Yes    Pain Score  1     Pain Location  Knee    Pain Orientation  Left    Pain Descriptors / Indicators  Tightness    Pain Type  Surgical pain    Pain Onset  1 to 4 weeks ago    Pain Frequency  Constant    Aggravating Factors   sitting with constant bend, flexing knee    Pain Relieving Factors  muscle relaxors, oxycodone    Effect of Pain on Daily  Activities  limited         [facility] PT Assessment - 04/12/19 0001      Assessment   Medical Diagnosis  L TKA    Referring Provider (PT)  Jean Rosenthal, MD    Onset Date/Surgical Date  03/21/19    Next MD Visit  February 2021    Prior Therapy  Yes, acute care and HHPT after surgery      Precautions   Precautions  None      Observation/Other Assessments-Edema    Edema  --   smallest ankle: 11.75; largest ankle 16.4 and thigh 23.75in                  [facility] Adult PT Treatment/Exercise - 04/12/19 0001      Exercises   Exercises  Knee/Hip      Knee/Hip Exercises: Stretches   Quad Stretch  3 reps;30 seconds    Quad Stretch Limitations  prone with rope    Knee: Self-Stretch to increase Flexion  5 reps;10 seconds    Knee: Self-Stretch Limitations  knee drive on [id number] step        Knee/Hip Exercises: Aerobic   Stationary Bike  rocking x 4 min      Knee/Hip Exercises: Standing   Heel Raises  15 reps    Terminal Knee Extension  5 reps    Theraband Level (Terminal Knee Extension)  Other (comment)    Terminal Knee Extension Limitations  purple tband TKE wiht retro gait      Knee/Hip Exercises: Seated   Heel Slides  10 reps    Sit to Sand  10 reps;without UE support   equal weight bearing     Knee/Hip Exercises: Supine   Quad Sets  10 reps    Heel Slides  10 reps    Knee Flexion  AROM    Knee Flexion Limitations  88 degrees      Manual Therapy   Manual Therapy  Edema management    Manual therapy comments  Manual complete separate than rest of tx    Edema Management  Retrograde massage with LE elevated             PT Education - 04/12/19 1701    Education Details  Reviewed goals, assured compliance with HEP.  Discussed benefits of compression hose for edema control, pt stated MD recommend he seek thigh high compression garments.  Reviewed RICE techniques for edema and pain control.    Person(s) Educated  Patient    Methods  Explanation    Comprehension  Verbalized understanding       PT Short Term Goals - 04/10/19 1259      PT SHORT TERM GOAL #1   Title  Pt will perform HEP at least 2x/week to improve knee strength and AROM needed for functional activity.    Time  4    Period  Weeks    Status  New    Target Date  05/08/19      PT SHORT TERM GOAL #2   Title  Pt will demonstrate L knee AROM 0-90 degrees to improve gait mechanics and ability to navigate community.    Time  4    Period  Weeks    Status  New        PT Long Term Goals - 04/10/19 1301      PT LONG TERM GOAL #1   Title  Pt will demonstrate L knee AROM 0-120 degrees to improve gait  mechanics and ability to navigate community.    Time  6    Period  Weeks    Status  New    Target Date  05/22/19      PT LONG TERM GOAL #2   Title  Pt will self report 5/10 pain at worst with  knee flexion to improve ability to perform functional activities.    Time  6    Period  Weeks    Status  New      PT LONG TERM GOAL #3   Title  Pt will improve FOTO score to 10% limited to represent significant improvement in ability to complete household chores with less pain.    Time  6    Period  Weeks    Status  New      PT LONG TERM GOAL #4   Title  Pt will tolerate lymphedema evaluation, if appropriate.    Time  6    Period  Weeks    Status  New            Plan - 04/12/19 1716    Clinical Impression Statement  Reviewed goals and assured compliance with HEP and edema control.  Pt reports he has been elevating LE for edema control, encouraged to add ice wiht elevation following session.  Session focus on ROM based therex and edema control.  Pt able to complete all exercises/stretches with good form and no reports of increased pain.  EOS with retrograde massage for edema control.  Discussed benefits of compression hose.  Pt stated MD also mentioned benefits of thigh high compression garments.  Measurements were taken and handout given for [place]. for thigh high compression hose.  AROM improved to 88 degrees flexoin (was 78 degrees last session.)    Personal Factors and Comorbidities  Comorbidity 1    Comorbidities  prostate CA with lymphadenectomy in pelvis/BLE    Examination-Activity Limitations  Bend;Squat;Stairs    Examination-Participation Restrictions  Church;Yard Work    Stability/Clinical Decision Making  Stable/Uncomplicated    Designer, jewellery  Low    Rehab Potential  Good    PT Frequency  3x / week    PT Duration  6 weeks    PT Treatment/Interventions  ADLs/Self Care Home Management;Aquatic Therapy;Biofeedback;DME Instruction;Gait training;Stair training;Functional mobility training;Therapeutic activities;Therapeutic exercise;Balance training;Neuromuscular re-education;Patient/family education;Orthotic Fit/Training;Manual techniques;Manual lymph  drainage;Compression bandaging;Scar mobilization;Passive range of motion;Dry needling;Taping;Joint Manipulations    PT Next Visit Plan  Focus on increasing L knee flexion through ROM and strengthening exercises.Use manual as needed to increase ROM. Consider lymphadema eval if swelling continues to be a problem.    PT Home Exercise Plan  Eval: continue HHPT exercises, add seated heel slides with RLE overpressure       Patient will benefit from skilled therapeutic intervention in order to improve the following deficits and impairments:  Abnormal gait, Decreased balance, Decreased range of motion, Decreased strength, Increased edema, Increased fascial restricitons, Impaired flexibility, Improper body mechanics, Pain  Visit Diagnosis: Chronic pain of left knee  Stiffness of left knee, not elsewhere classified  Other symptoms and signs involving the musculoskeletal system     Problem List Patient Active Problem List   Diagnosis Date Noted  . Status post total left knee replacement 03/21/2019  . S/P carpal tunnel release right 02/22/19 03/08/2019  . Carpal tunnel syndrome of right wrist   . HTN (hypertension) 10/27/2018  . Prostate cancer ([place]) 05/30/2017  . Malignant neoplasm of prostate ([place]) 04/28/2017  .  Chronic pain of both knees 02/21/2017  . Unilateral primary osteoarthritis, left knee 02/21/2017  . Unilateral primary osteoarthritis, right knee 02/21/2017   Ihor Austin, LPTA; CBIS [phone]  Aldona Lento 04/12/2019, 5:21 PM  [facility] [place], [place], [address] Phone: [phone]   Fax:  [phone]  Name: BANDON CHERNE MRN: [id number] Date of Birth: 1956-02-20

## 2019-04-13 ENCOUNTER — Telehealth: Payer: Self-pay | Admitting: Orthopaedic Surgery

## 2019-04-13 ENCOUNTER — Other Ambulatory Visit: Payer: Self-pay | Admitting: Cardiology

## 2019-04-13 ENCOUNTER — Other Ambulatory Visit: Payer: Self-pay | Admitting: Orthopaedic Surgery

## 2019-04-13 DIAGNOSIS — I10 Essential (primary) hypertension: Secondary | ICD-10-CM

## 2019-04-13 MED ORDER — OXYCODONE HCL 5 MG PO TABS: 5.0000 mg | ORAL_TABLET | Freq: Four times a day (QID) | ORAL | 0 refills | Status: DC | PRN

## 2019-04-13 NOTE — Telephone Encounter (Signed)
Patient wife called stated patient has been doing PT and -taking meds but will run out of Oxycodone on Sunday. Calling for a refill.  Please call patient spouse(Diane) to advise.  [phone]

## 2019-04-13 NOTE — Telephone Encounter (Signed)
Please advise 

## 2019-04-17 ENCOUNTER — Ambulatory Visit (HOSPITAL_COMMUNITY): Payer: 59

## 2019-04-17 ENCOUNTER — Other Ambulatory Visit: Payer: Self-pay

## 2019-04-17 ENCOUNTER — Encounter (HOSPITAL_COMMUNITY): Payer: Self-pay

## 2019-04-17 DIAGNOSIS — G8929 Other chronic pain: Secondary | ICD-10-CM

## 2019-04-17 DIAGNOSIS — R29898 Other symptoms and signs involving the musculoskeletal system: Secondary | ICD-10-CM

## 2019-04-17 DIAGNOSIS — M25662 Stiffness of left knee, not elsewhere classified: Secondary | ICD-10-CM

## 2019-04-17 DIAGNOSIS — M25562 Pain in left knee: Secondary | ICD-10-CM

## 2019-04-17 NOTE — Therapy (Signed)
[place], [place], [address] Phone: [phone]   Fax:  [phone]  Physical Therapy Treatment  Patient Details  Name: [name] MRN: [id number] Date of Birth: 26-May-1955 Referring Provider (PT): Jean Rosenthal, MD   Encounter Date: 04/17/2019  PT End of Session - 04/17/19 1610    Visit Number  3    Number of Visits  18    Date for PT Re-Evaluation  05/22/19    Authorization Type  [employer]    Authorization Time Period  04/10/19 to 05/22/19    Authorization - Visit Number  3    Authorization - Number of Visits  30    PT Start Time  1600    PT Stop Time  [id number]    PT Time Calculation (min)  43 min    Activity Tolerance  Patient tolerated treatment well;No increased pain    Behavior During Therapy  WFL for tasks assessed/performed       Past Medical History:  Diagnosis Date  . Arthritis   . Diabetes mellitus without complication ([place]) [id number]   Diabetes Type II  . HTN (hypertension) 10/27/2018  . Hyperlipidemia   . Internal hemorrhoids 2014   noted on colonoscopy  . Osteoarthritis of both knees   . Pre-diabetes    questionable  . Prostate cancer [facility])    prostate  . Sigmoid diverticulosis 2014   Mild, noted on colonoscopy    Past Surgical History:  Procedure Laterality Date  . CARPAL TUNNEL RELEASE Right 02/22/2019   Procedure: CARPAL TUNNEL RELEASE;  Surgeon: Carole Civil, MD;  Location: [facility];  Service: Orthopedics;  Laterality: Right;  . COLONOSCOPY N/A 09/13/2012   Procedure: COLONOSCOPY;  Surgeon: Danie Binder, MD;  Location[facility];  Service: Endoscopy;  Laterality: N/A;  9:30 AM  . HERNIA REPAIR     20 years ago  . KNEE ARTHROSCOPY Left    15 years ago  . LYMPHADENECTOMY Bilateral 05/30/2017   Procedure: LYMPHADENECTOMY, PELVIC EXTENDED;  Surgeon: [name], MD;  Location: [facility];  Service: Urology;  Laterality: Bilateral;  . ROBOT ASSISTED LAPAROSCOPIC  RADICAL PROSTATECTOMY N/A 05/30/2017   Procedure: XI ROBOTIC ASSISTED LAPAROSCOPIC RADICAL PROSTATECTOMY LEVEL 3;  Surgeon: [name], MD;  Location: [facility];  Service: Urology;  Laterality: N/A;  . SHOULDER ARTHROSCOPY Right    15 years ago  . TOTAL KNEE ARTHROPLASTY Left 03/21/2019   Procedure: LEFT TOTAL KNEE ARTHROPLASTY;  Surgeon: Mcarthur Rossetti, MD;  Location: [facility];  Service: Orthopedics;  Laterality: Left;    There were no vitals filed for this visit.  Subjective Assessment - 04/17/19 1607    Subjective  Pt reports knee pain, but he feels like it is bending better. Pt reports wearing thigh high compression stocking, but is having continued swelling in LLE.    How long can you sit comfortably?  5-10 minutes    How long can you stand comfortably?  no issues    How long can you walk comfortably?  no issues    Patient Stated Goals  "I want you to get this thing to bend without hurting"    Currently in Pain?  Yes    Pain Score  2     Pain Location  Knee    Pain Orientation  Left    Pain Descriptors / Indicators  Aching    Pain Type  Surgical pain    Pain Onset  1 to  4 weeks ago    Pain Frequency  Constant    Aggravating Factors   sitting with constant bend, flexing knee    Pain Relieving Factors  muscle relaxors, oxycodone    Effect of Pain on Daily Activities  limited           [facility] Adult PT Treatment/Exercise - 04/17/19 0001      Knee/Hip Exercises: Stretches   Knee: Self-Stretch to increase Flexion  5 reps;10 seconds;Left    Knee: Self-Stretch Limitations  knee drive on [id number] step       Knee/Hip Exercises: Aerobic   Stationary Bike  half revolutions, seat 13, 4 min      Knee/Hip Exercises: Standing   Heel Raises  15 reps    Forward Step Up  Left;10 reps;Step Height: 6"      Knee/Hip Exercises: Seated   Heel Slides  Left;2 sets;10 reps    Heel Slides Limitations  RLE overpressure, 5 sec hold    Sit to Sand  2 sets;10 reps   2" step under R foot to  encourage L shift     Knee/Hip Exercises: Supine   Quad Sets  Left;15 reps    Quad Sets Limitations  5 sec hold    Heel Slides  10 reps    Straight Leg Raises  Left;15 reps    Knee Flexion  AROM    Knee Flexion Limitations  90 deg      Knee/Hip Exercises: Prone   Other Prone Exercises  prone TK, LLE 10 reps, 3 sec hold    Other Prone Exercises  --      Manual Therapy   Manual Therapy  Edema management;Muscle Energy Technique    Manual therapy comments  Manual complete separate than rest of tx    Edema Management  Retrograde massage with LE elevated    Muscle Energy Technique  contract-relax knee flexion in prone, 5 sec contract to therapist resistance, followed by 5 sec relax             PT Education - 04/17/19 1609    Education Details  Exercise technique, continue HEP, rearranging schedule for treatment with lymphedema therapist    Person(s) Educated  Patient    Methods  Explanation    Comprehension  Verbalized understanding       PT Short Term Goals - 04/17/19 1610      PT SHORT TERM GOAL #1   Title  Pt will perform HEP at least 2x/week to improve knee strength and AROM needed for functional activity.    Time  4    Period  Weeks    Status  On-going    Target Date  05/08/19      PT SHORT TERM GOAL #2   Title  Pt will demonstrate L knee AROM 0-90 degrees to improve gait mechanics and ability to navigate community.    Time  4    Period  Weeks    Status  On-going        PT Long Term Goals - 04/17/19 1610      PT LONG TERM GOAL #1   Title  Pt will demonstrate L knee AROM 0-120 degrees to improve gait mechanics and ability to navigate community.    Time  6    Period  Weeks    Status  On-going      PT LONG TERM GOAL #2   Title  Pt will self report 5/10 pain at worst with knee flexion to  improve ability to perform functional activities.    Time  6    Period  Weeks    Status  On-going      PT LONG TERM GOAL #3   Title  Pt will improve FOTO score to 10%  limited to represent significant improvement in ability to complete household chores with less pain.    Time  6    Period  Weeks    Status  On-going      PT LONG TERM GOAL #4   Title  Pt will tolerate lymphedema evaluation, if appropriate.    Time  6    Period  Weeks    Status  On-going            Plan - 04/17/19 1652    Clinical Impression Statement  Pt continues to have L knee stiffness and swelling throughout entire LLE. Pt unable to complete full revolution on bicycle but tolerates half revolutions with hold at end for added flexion stretch. Pt with improved weight-shifting to L side with 2" step added under R foot. Pt able to achieve greater L knee flexion in standing and sitting compared to supine. Good results with contract relax for knee flexion. Pt reports improvement in pain and stiffness at EOS. Pt with AROM 90 deg flexion at EOS. Ended with edema management, will continue to monitor for lymphedema management need. Continue to progress as able.    Personal Factors and Comorbidities  Comorbidity 1    Comorbidities  prostate CA with lymphadenectomy in pelvis/BLE    Examination-Activity Limitations  Bend;Squat;Stairs    Examination-Participation Restrictions  Church;Yard Work    Stability/Clinical Decision Making  Stable/Uncomplicated    Rehab Potential  Good    PT Frequency  3x / week    PT Duration  6 weeks    PT Treatment/Interventions  ADLs/Self Care Home Management;Aquatic Therapy;Biofeedback;DME Instruction;Gait training;Stair training;Functional mobility training;Therapeutic activities;Therapeutic exercise;Balance training;Neuromuscular re-education;Patient/family education;Orthotic Fit/Training;Manual techniques;Manual lymph drainage;Compression bandaging;Scar mobilization;Passive range of motion;Dry needling;Taping;Joint Manipulations    PT Next Visit Plan  L knee flexion ROM and strengthening exercises. Use manual as needed to increase ROM. Consider lymphadema eval if  swelling continues to be a problem.    PT Home Exercise Plan  Eval: continue HHPT exercises, add seated heel slides with RLE overpressure    Consulted and Agree with Plan of Care  Patient       Patient will benefit from skilled therapeutic intervention in order to improve the following deficits and impairments:  Abnormal gait, Decreased balance, Decreased range of motion, Decreased strength, Increased edema, Increased fascial restricitons, Impaired flexibility, Improper body mechanics, Pain  Visit Diagnosis: Chronic pain of left knee  Stiffness of left knee, not elsewhere classified  Other symptoms and signs involving the musculoskeletal system     Problem List Patient Active Problem List   Diagnosis Date Noted  . Status post total left knee replacement 03/21/2019  . S/P carpal tunnel release right 02/22/19 03/08/2019  . Carpal tunnel syndrome of right wrist   . HTN (hypertension) 10/27/2018  . Prostate cancer ([place]) 05/30/2017  . Malignant neoplasm of prostate ([place]) 04/28/2017  . Chronic pain of both knees 02/21/2017  . Unilateral primary osteoarthritis, left knee 02/21/2017  . Unilateral primary osteoarthritis, right knee 02/21/2017    Talbot Grumbling PT, DPT 04/17/19, 4:55 PM [place] [address] [place], [place], [address] Phone: [phone]   Fax:  [phone]  Name: [name] MRN: [id number]  Date of Birth: 1955/04/15

## 2019-04-19 ENCOUNTER — Ambulatory Visit (HOSPITAL_COMMUNITY): Payer: 59

## 2019-04-19 ENCOUNTER — Encounter (HOSPITAL_COMMUNITY): Payer: Self-pay

## 2019-04-19 ENCOUNTER — Other Ambulatory Visit: Payer: Self-pay

## 2019-04-19 DIAGNOSIS — M25662 Stiffness of left knee, not elsewhere classified: Secondary | ICD-10-CM

## 2019-04-19 DIAGNOSIS — R29898 Other symptoms and signs involving the musculoskeletal system: Secondary | ICD-10-CM

## 2019-04-19 DIAGNOSIS — G8929 Other chronic pain: Secondary | ICD-10-CM

## 2019-04-19 DIAGNOSIS — M25562 Pain in left knee: Secondary | ICD-10-CM | POA: Diagnosis not present

## 2019-04-19 NOTE — Patient Instructions (Signed)
Knee / Archie Balboa on stomach, knees together. Grab one ankle with same side hand (may use dog leash).  Gently pull foot toward buttock.  Hold 30 seconds. Repeat with other leg. Repeat 3 times. Do 2 sessions per day.  Copyright  V[employer]. All rights reserved.   Adductor Stretch - Supine    Lie on floor, knees bent, feet flat. Keeping feet together, lower knees toward floor until stretch felt at inner thighs. Repeat 3 times hold for 30 seconds.   Copyright  VHI. All rights reserved.

## 2019-04-19 NOTE — Therapy (Signed)
[place], [place], [address] Phone: [phone]   Fax:  [phone]  Physical Therapy Treatment  Patient Details  Name: [name] MRN: [id number] Date of Birth: [birth year] Referring Provider (PT): Jean Rosenthal, MD   Encounter Date: 04/19/2019  PT End of Session - 04/19/19 1619    Visit Number  4    Number of Visits  18    Date for PT Re-Evaluation  05/22/19    Authorization Type  [employer]    Authorization Time Period  04/10/19 to 05/22/19    Authorization - Visit Number  4    Authorization - Number of Visits  30    PT Start Time  [id number]   4' on bike, not included wiht charges   PT Stop Time  1700    PT Time Calculation (min)  42 min    Activity Tolerance  Patient tolerated treatment well;No increased pain    Behavior During Therapy  WFL for tasks assessed/performed       Past Medical History:  Diagnosis Date  . Arthritis   . Diabetes mellitus without complication ([place]) [id number]   Diabetes Type II  . HTN (hypertension) 10/27/2018  . Hyperlipidemia   . Internal hemorrhoids 2014   noted on colonoscopy  . Osteoarthritis of both knees   . Pre-diabetes    questionable  . Prostate cancer [facility])    prostate  . Sigmoid diverticulosis 2014   Mild, noted on colonoscopy    Past Surgical History:  Procedure Laterality Date  . CARPAL TUNNEL RELEASE Right 02/22/2019   Procedure: CARPAL TUNNEL RELEASE;  Surgeon: Carole Civil, MD;  Location: [facility];  Service: Orthopedics;  Laterality: Right;  . COLONOSCOPY N/A 09/13/2012   Procedure: COLONOSCOPY;  Surgeon: Danie Binder, MD;  [facility];  Service: Endoscopy;  Laterality: N/A;  9:30 AM  . HERNIA REPAIR     20 years ago  . KNEE ARTHROSCOPY Left    15 years ago  . LYMPHADENECTOMY Bilateral 05/30/2017   Procedure: LYMPHADENECTOMY, PELVIC EXTENDED;  Surgeon: [name], MD;  [facility];  Service: Urology;  Laterality:  Bilateral;  . ROBOT ASSISTED LAPAROSCOPIC RADICAL PROSTATECTOMY N/A 05/30/2017   Procedure: XI ROBOTIC ASSISTED LAPAROSCOPIC RADICAL PROSTATECTOMY LEVEL 3;  Surgeon: [name], MD;  [facility];  Service: Urology;  Laterality: N/A;  . SHOULDER ARTHROSCOPY Right    15 years ago  . TOTAL KNEE ARTHROPLASTY Left 03/21/2019   Procedure: LEFT TOTAL KNEE ARTHROPLASTY;  Surgeon: Mcarthur Rossetti, MD;  [facility];  Service: Orthopedics;  Laterality: Left;    There were no vitals filed for this visit.  Subjective Assessment - 04/19/19 1618    Subjective  Knee if stiff today,  Took pain meds prior sessoin today.  Pain scale 3/10 knee pain.  Reports compliance with HEP daily.                       [facility] Adult PT Treatment/Exercise - 04/19/19 0001      Exercises   Exercises  Knee/Hip      Knee/Hip Exercises: Stretches   Quad Stretch  3 reps;30 seconds    Quad Stretch Limitations  prone with rope    Knee: Self-Stretch to increase Flexion  5 reps;10 seconds;Left    Knee: Self-Stretch Limitations  knee drive on [id number] step     Other Knee/Hip Stretches  frog stretch 2x 30" for adductor  tightness      Knee/Hip Exercises: Aerobic   Stationary Bike  half revolutions, seat 13, 4 min      Knee/Hip Exercises: Seated   Heel Slides  Left;2 sets;10 reps    Heel Slides Limitations  RLE overpressure, 5 sec hold      Knee/Hip Exercises: Supine   Quad Sets  Left;15 reps    Quad Sets Limitations  5" hold    Heel Slides  10 reps    Straight Leg Raises  Left;15 reps    Knee Flexion  AROM    Knee Flexion Limitations  --   was 90 deg last sessoin     Knee/Hip Exercises: Prone   Contract/Relax to Increase Flexion  5x 10" for flexion    Other Prone Exercises  prone TKE, LLE 10 reps, 3 sec hold      Manual Therapy   Manual Therapy  Edema management;Myofascial release;Soft tissue mobilization    Manual therapy comments  Manual complete separate than rest of tx    Edema  Management  Retrograde massage with LE elevated    Soft tissue mobilization  STM to quadriceps and adductors    Myofascial Release  scar tissue adhesions around insicion    Muscle Energy Technique  contract-relax knee flexion in prone, 10 sec contract to therapist resistance, followed by 5 sec relax               PT Short Term Goals - 04/17/19 1610      PT SHORT TERM GOAL #1   Title  Pt will perform HEP at least 2x/week to improve knee strength and AROM needed for functional activity.    Time  4    Period  Weeks    Status  On-going    Target Date  05/08/19      PT SHORT TERM GOAL #2   Title  Pt will demonstrate L knee AROM 0-90 degrees to improve gait mechanics and ability to navigate community.    Time  4    Period  Weeks    Status  On-going        PT Long Term Goals - 04/17/19 1610      PT LONG TERM GOAL #1   Title  Pt will demonstrate L knee AROM 0-120 degrees to improve gait mechanics and ability to navigate community.    Time  6    Period  Weeks    Status  On-going      PT LONG TERM GOAL #2   Title  Pt will self report 5/10 pain at worst with knee flexion to improve ability to perform functional activities.    Time  6    Period  Weeks    Status  On-going      PT LONG TERM GOAL #3   Title  Pt will improve FOTO score to 10% limited to represent significant improvement in ability to complete household chores with less pain.    Time  6    Period  Weeks    Status  On-going      PT LONG TERM GOAL #4   Title  Pt will tolerate lymphedema evaluation, if appropriate.    Time  6    Period  Weeks    Status  On-going            Plan - 04/19/19 1709    Clinical Impression Statement  Session focus on knee mobility.  Began session with manual retrograde massage for edema  control. STM to address tightness/restrictions in quadriceps and adductors and patella mobs for knee mobility.  Pt wiht significant thigh tightness, educated on benefits of prolonged stretches  to assist with tightness, given frog st for adductor tightness and prone quad stretch.  Pt with good tolerance with contract/relax, improved knee flexion to 98 degrees (was 90 last session.)    Personal Factors and Comorbidities  Comorbidity 1    Comorbidities  prostate CA with lymphadenectomy in pelvis/BLE    Examination-Activity Limitations  Bend;Squat;Stairs    Examination-Participation Restrictions  Church;Yard Work    Stability/Clinical Decision Making  Stable/Uncomplicated    Designer, jewellery  Low    Rehab Potential  Good    PT Frequency  3x / week    PT Duration  6 weeks    PT Treatment/Interventions  ADLs/Self Care Home Management;Aquatic Therapy;Biofeedback;DME Instruction;Gait training;Stair training;Functional mobility training;Therapeutic activities;Therapeutic exercise;Balance training;Neuromuscular re-education;Patient/family education;Orthotic Fit/Training;Manual techniques;Manual lymph drainage;Compression bandaging;Scar mobilization;Passive range of motion;Dry needling;Taping;Joint Manipulations    PT Next Visit Plan  L knee flexion ROM and strengthening exercises. Use manual as needed to increase ROM. Consider lymphadema eval if swelling continues to be a problem.    PT Home Exercise Plan  Eval: continue HHPT exercises, add seated heel slides with RLE overpressure; 04/19/19: quad stretch and frog stretch       Patient will benefit from skilled therapeutic intervention in order to improve the following deficits and impairments:  Abnormal gait, Decreased balance, Decreased range of motion, Decreased strength, Increased edema, Increased fascial restricitons, Impaired flexibility, Improper body mechanics, Pain  Visit Diagnosis: Chronic pain of left knee  Stiffness of left knee, not elsewhere classified  Other symptoms and signs involving the musculoskeletal system     Problem List Patient Active Problem List   Diagnosis Date Noted  . Status post total left knee  replacement 03/21/2019  . S/P carpal tunnel release right 02/22/19 03/08/2019  . Carpal tunnel syndrome of right wrist   . HTN (hypertension) 10/27/2018  . Prostate cancer ([place]) 05/30/2017  . Malignant neoplasm of prostate ([place]) 04/28/2017  . Chronic pain of both knees 02/21/2017  . Unilateral primary osteoarthritis, left knee 02/21/2017  . Unilateral primary osteoarthritis, right knee 02/21/2017   Ihor Austin, LPTA; CBIS [phone]  Aldona Lento 04/19/2019, 5:23 PM  [place], [place], [address] Phone: [phone]   Fax:  [phone]  Name: [name] MRN: [id number] Date of Birth: 22-Sep-1955

## 2019-04-20 ENCOUNTER — Ambulatory Visit (HOSPITAL_COMMUNITY): Payer: 59

## 2019-04-20 ENCOUNTER — Encounter (HOSPITAL_COMMUNITY): Payer: Self-pay

## 2019-04-20 DIAGNOSIS — M25562 Pain in left knee: Secondary | ICD-10-CM | POA: Diagnosis not present

## 2019-04-20 DIAGNOSIS — R29898 Other symptoms and signs involving the musculoskeletal system: Secondary | ICD-10-CM

## 2019-04-20 DIAGNOSIS — M25662 Stiffness of left knee, not elsewhere classified: Secondary | ICD-10-CM

## 2019-04-20 DIAGNOSIS — G8929 Other chronic pain: Secondary | ICD-10-CM

## 2019-04-20 NOTE — Therapy (Signed)
[place], [place], [address] Phone: [phone]   Fax:  [phone]  Physical Therapy Treatment  Patient Details  Name: [name] MRN: [id number] Date of Birth: 1955/05/14 Referring Provider (PT): Jean Rosenthal, MD   Encounter Date: 04/20/2019  PT End of Session - 04/20/19 1702    Visit Number  5    Number of Visits  18    Date for PT Re-Evaluation  05/22/19    Authorization Type  [employer]    Authorization Time Period  04/10/19 to 05/22/19    Authorization - Visit Number  5    Authorization - Number of Visits  30    PT Start Time  [id number]   4' on bike, not included last session.   PT Stop Time  1740    PT Time Calculation (min)  44 min    Activity Tolerance  Patient tolerated treatment well;Patient limited by pain   Increased soreness at EOS   Behavior During Therapy  [facility] for tasks assessed/performed       Past Medical History:  Diagnosis Date  . Arthritis   . Diabetes mellitus without complication ([place]) [id number]   Diabetes Type II  . HTN (hypertension) 10/27/2018  . Hyperlipidemia   . Internal hemorrhoids 2014   noted on colonoscopy  . Osteoarthritis of both knees   . Pre-diabetes    questionable  . Prostate cancer [facility])    prostate  . Sigmoid diverticulosis 2014   Mild, noted on colonoscopy    Past Surgical History:  Procedure Laterality Date  . CARPAL TUNNEL RELEASE Right 02/22/2019   Procedure: CARPAL TUNNEL RELEASE;  Surgeon: Carole Civil, MD;  Location: [facility];  Service: Orthopedics;  Laterality: Right;  . COLONOSCOPY N/A 09/13/2012   Procedure: COLONOSCOPY;  Surgeon: Danie Binder, MD;  [facility];  Service: Endoscopy;  Laterality: N/A;  9:30 AM  . HERNIA REPAIR     20 years ago  . KNEE ARTHROSCOPY Left    15 years ago  . LYMPHADENECTOMY Bilateral 05/30/2017   Procedure: LYMPHADENECTOMY, PELVIC EXTENDED;  Surgeon: [name], MD;  [facility]: [facility];   Service: Urology;  Laterality: Bilateral;  . ROBOT ASSISTED LAPAROSCOPIC RADICAL PROSTATECTOMY N/A 05/30/2017   Procedure: XI ROBOTIC ASSISTED LAPAROSCOPIC RADICAL PROSTATECTOMY LEVEL 3;  Surgeon: [name], MD;  [facility]: [facility];  Service: Urology;  Laterality: N/A;  . SHOULDER ARTHROSCOPY Right    15 years ago  . TOTAL KNEE ARTHROPLASTY Left 03/21/2019   Procedure: LEFT TOTAL KNEE ARTHROPLASTY;  Surgeon: Mcarthur Rossetti, MD;  [facility]: [facility];  Service: Orthopedics;  Laterality: Left;    There were no vitals filed for this visit.  Subjective Assessment - 04/20/19 1700    Subjective  Knee is feeling good today, a little soreness and stiffness.  Pain scale minimum today.  Stated he purchased some cream for incision and has began to apply ice more regularly, feels swelling has reduced.    Patient Stated Goals  "I want you to get this thing to bend without hurting"    Currently in Pain?  Yes    Pain Score  2     Pain Location  Knee    Pain Orientation  Left    Pain Descriptors / Indicators  Sore;Tightness;Aching    Pain Type  Surgical pain    Pain Onset  1 to 4 weeks ago    Pain Frequency  Constant  Aggravating Factors   sitting with constant bend, flexing knee    Pain Relieving Factors  muscle relaxors, oxycodone    Effect of Pain on Daily Activities  limited                       [facility] Adult PT Treatment/Exercise - 04/20/19 0001      Exercises   Exercises  Knee/Hip      Knee/Hip Exercises: Stretches   Quad Stretch  3 reps;30 seconds    Quad Stretch Limitations  prone with rope    Knee: Self-Stretch to increase Flexion  5 reps;10 seconds;Left    Knee: Self-Stretch Limitations  knee drive on [id number] step       Knee/Hip Exercises: Aerobic   Stationary Bike  half revolutions, seat 13, 4 min      Knee/Hip Exercises: Seated   Heel Slides  Left;10 reps    Heel Slides Limitations  RLE overpressure, 5 sec hold      Knee/Hip Exercises: Supine   Heel  Slides  10 reps    Knee Flexion  AAROM    Knee Flexion Limitations  105 degrees with Rt LE AAROM   was 98 deg last session     Knee/Hip Exercises: Prone   Contract/Relax to Increase Flexion  5x 10" for flexion    Other Prone Exercises  prone TKE, LLE 10 reps, 3 sec hold      Manual Therapy   Manual Therapy  Edema management;Joint mobilization;Soft tissue mobilization;Myofascial release;Passive ROM    Manual therapy comments  Manual complete separate than rest of tx    Edema Management  Retrograde massage with LE elevated    Joint Mobilization  patella mobs all directions    Soft tissue mobilization  STM to quadriceps and adductors    Myofascial Release  scar tissue adhesions around insicion    Passive ROM  contract-relax knee flexion in prone, 10 sec contract to therapist resistance, followed by 5 sec relax               PT Short Term Goals - 04/17/19 1610      PT SHORT TERM GOAL #1   Title  Pt will perform HEP at least 2x/week to improve knee strength and AROM needed for functional activity.    Time  4    Period  Weeks    Status  On-going    Target Date  05/08/19      PT SHORT TERM GOAL #2   Title  Pt will demonstrate L knee AROM 0-90 degrees to improve gait mechanics and ability to navigate community.    Time  4    Period  Weeks    Status  On-going        PT Long Term Goals - 04/17/19 1610      PT LONG TERM GOAL #1   Title  Pt will demonstrate L knee AROM 0-120 degrees to improve gait mechanics and ability to navigate community.    Time  6    Period  Weeks    Status  On-going      PT LONG TERM GOAL #2   Title  Pt will self report 5/10 pain at worst with knee flexion to improve ability to perform functional activities.    Time  6    Period  Weeks    Status  On-going      PT LONG TERM GOAL #3   Title  Pt will improve FOTO  score to 10% limited to represent significant improvement in ability to complete household chores with less pain.    Time  6    Period   Weeks    Status  On-going      PT LONG TERM GOAL #4   Title  Pt will tolerate lymphedema evaluation, if appropriate.    Time  6    Period  Weeks    Status  On-going            Plan - 04/20/19 1749    Clinical Impression Statement  Began session with manual to assist with edema control and softening tissues to assist with knee mobility.  Pt unable to make full revolution around bike, rocking complete for mobility.  Pt tolerated well with contract/relax and improved AAROM wiht Rt LE aide to 105 degrees flexion (was 98 last session.)    Personal Factors and Comorbidities  Comorbidity 1    Comorbidities  prostate CA with lymphadenectomy in pelvis/BLE    Examination-Activity Limitations  Bend;Squat;Stairs    Examination-Participation Restrictions  Church;Yard Work    Stability/Clinical Decision Making  Stable/Uncomplicated    [employer]er, jewellery  Low    Rehab Potential  Good    PT Frequency  3x / week    PT Duration  6 weeks    PT Treatment/Interventions  ADLs/Self Care Home Management;Aquatic Therapy;Biofeedback;DME Instruction;Gait training;Stair training;Functional mobility training;Therapeutic activities;Therapeutic exercise;Balance training;Neuromuscular re-education;Patient/family education;Orthotic Fit/Training;Manual techniques;Manual lymph drainage;Compression bandaging;Scar mobilization;Passive range of motion;Dry needling;Taping;Joint Manipulations    PT Next Visit Plan  L knee flexion ROM and strengthening exercises. Use manual as needed to increase ROM. Consider lymphadema eval if swelling continues to be a problem.    PT Home Exercise Plan  Eval: continue HHPT exercises, add seated heel slides with RLE overpressure; 04/19/19: quad stretch and frog stretch       Patient will benefit from skilled therapeutic intervention in order to improve the following deficits and impairments:  Abnormal gait, Decreased balance, Decreased range of motion, Decreased strength, Increased  edema, Increased fascial restricitons, Impaired flexibility, Improper body mechanics, Pain  Visit Diagnosis: Other symptoms and signs involving the musculoskeletal system  Stiffness of left knee, not elsewhere classified  Chronic pain of left knee     Problem List Patient Active Problem List   Diagnosis Date Noted  . Status post total left knee replacement 03/21/2019  . S/P carpal tunnel release right 02/22/19 03/08/2019  . Carpal tunnel syndrome of right wrist   . HTN (hypertension) 10/27/2018  . Prostate cancer ([place]) 05/30/2017  . Malignant neoplasm of prostate ([place]) 04/28/2017  . Chronic pain of both knees 02/21/2017  . Unilateral primary osteoarthritis, left knee 02/21/2017  . Unilateral primary osteoarthritis, right knee 02/21/2017   Ihor Austin, LPTA; CBIS [id number] Aldona Lento 04/20/2019, 5:53 PM  [place] [address] [place], [place], [address] Phone: [phone]   Fax:  [phone]  Name: [name] MRN: [id number] Date of Birth: 08/04/1955

## 2019-04-23 ENCOUNTER — Ambulatory Visit (HOSPITAL_COMMUNITY): Payer: 59 | Attending: Orthopaedic Surgery | Admitting: Physical Therapy

## 2019-04-23 ENCOUNTER — Other Ambulatory Visit: Payer: Self-pay

## 2019-04-23 DIAGNOSIS — M25662 Stiffness of left knee, not elsewhere classified: Secondary | ICD-10-CM | POA: Diagnosis present

## 2019-04-23 DIAGNOSIS — I890 Lymphedema, not elsewhere classified: Secondary | ICD-10-CM | POA: Insufficient documentation

## 2019-04-23 DIAGNOSIS — G8929 Other chronic pain: Secondary | ICD-10-CM | POA: Insufficient documentation

## 2019-04-23 DIAGNOSIS — R29898 Other symptoms and signs involving the musculoskeletal system: Secondary | ICD-10-CM | POA: Insufficient documentation

## 2019-04-23 DIAGNOSIS — M25562 Pain in left knee: Secondary | ICD-10-CM | POA: Insufficient documentation

## 2019-04-23 NOTE — Therapy (Signed)
[name], [place], [address] Phone: [phone]   Fax:  [phone]  Physical Therapy Treatment  Patient Details  Name: [name] MRN: [id number] Date of Birth: April 19, 1955 Referring Provider (PT): Jean Rosenthal, MD   Encounter Date: 04/23/2019  PT End of Session - 04/23/19 1709    Visit Number  6    Number of Visits  18    Date for PT Re-Evaluation  05/22/19    Authorization Type  [employer]    Authorization Time Period  04/10/19 to 05/22/19    Authorization - Visit Number  6    Authorization - Number of Visits  30    PT Start Time  1620    PT Stop Time  1704    PT Time Calculation (min)  44 min    Activity Tolerance  Patient tolerated treatment well;Patient limited by pain   Increased soreness at EOS   Behavior During Therapy  [facility] for tasks assessed/performed       Past Medical History:  Diagnosis Date  . Arthritis   . Diabetes mellitus without complication ([place]) XX123456   Diabetes Type II  . HTN (hypertension) 10/27/2018  . Hyperlipidemia   . Internal hemorrhoids 2014   noted on colonoscopy  . Osteoarthritis of both knees   . Pre-diabetes    questionable  . Prostate cancer [facility])    prostate  . Sigmoid diverticulosis 2014   Mild, noted on colonoscopy    Past Surgical History:  Procedure Laterality Date  . CARPAL TUNNEL RELEASE Right 02/22/2019   Procedure: CARPAL TUNNEL RELEASE;  Surgeon: Carole Civil, MD;  Location: [facility];  Service: Orthopedics;  Laterality: Right;  . COLONOSCOPY N/A 09/13/2012   Procedure: COLONOSCOPY;  Surgeon: Danie Binder, MD;  [facility];  Service: Endoscopy;  Laterality: N/A;  9:30 AM  . HERNIA REPAIR     20 years ago  . KNEE ARTHROSCOPY Left    15 years ago  . LYMPHADENECTOMY Bilateral 05/30/2017   Procedure: LYMPHADENECTOMY, PELVIC EXTENDED;  Surgeon: [name], MD;  [facility];  Service: Urology;  Laterality: Bilateral;  .  ROBOT ASSISTED LAPAROSCOPIC RADICAL PROSTATECTOMY N/A 05/30/2017   Procedure: XI ROBOTIC ASSISTED LAPAROSCOPIC RADICAL PROSTATECTOMY LEVEL 3;  Surgeon: [name], MD;  [facility];  Service: Urology;  Laterality: N/A;  . SHOULDER ARTHROSCOPY Right    15 years ago  . TOTAL KNEE ARTHROPLASTY Left 03/21/2019   Procedure: LEFT TOTAL KNEE ARTHROPLASTY;  Surgeon: Mcarthur Rossetti, MD;  [facility];  Service: Orthopedics;  Laterality: Left;    There were no vitals filed for this visit.  Subjective Assessment - 04/23/19 1623    Subjective  pt states he is not having any pain, just pain due to stiffness.    Currently in Pain?  Yes    Pain Score  2     Pain Location  Knee    Pain Orientation  Left    Pain Descriptors / Indicators  Aching;Tightness                       [facility] Adult PT Treatment/Exercise - 04/23/19 0001      Knee/Hip Exercises: Stretches   Knee: Self-Stretch to increase Flexion  5 reps;10 seconds;Left    Knee: Self-Stretch Limitations  knee drive on [id number] step       Knee/Hip Exercises: Aerobic   Stationary Bike  half revolutions, seat  13, 4 min      Knee/Hip Exercises: Seated   Sit to Sand  2 sets;10 reps      Knee/Hip Exercises: Supine   Heel Slides  10 reps    Straight Leg Raises  Left;15 reps    Knee Flexion  AROM    Knee Flexion Limitations  105      Knee/Hip Exercises: Prone   Hamstring Curl  10 reps    Contract/Relax to Increase Flexion  5x 10" for flexion    Other Prone Exercises  prone TKE, LLE 10 reps, 3 sec hold      Manual Therapy   Manual Therapy  Edema management;Joint mobilization;Soft tissue mobilization;Myofascial release;Passive ROM    Manual therapy comments  Manual complete separate than rest of tx    Edema Management  Retrograde massage with LE elevated    Joint Mobilization  patella mobs all directions    Soft tissue mobilization  STM to quadriceps and adductors    Myofascial Release  scar tissue adhesions  around insicion    Passive ROM  contract-relax knee flexion in prone, 10 sec contract to therapist resistance, followed by 5 sec relax             PT Education - 04/23/19 1712    Education Details  lymphedema:  causes, treatment and goals for therapy.  Encouraged to discuss with MD    Person(s) Educated  Patient    Methods  Explanation    Comprehension  Verbalized understanding       PT Short Term Goals - 04/17/19 1610      PT SHORT TERM GOAL #1   Title  Pt will perform HEP at least 2x/week to improve knee strength and AROM needed for functional activity.    Time  4    Period  Weeks    Status  On-going    Target Date  05/08/19      PT SHORT TERM GOAL #2   Title  Pt will demonstrate L knee AROM 0-90 degrees to improve gait mechanics and ability to navigate community.    Time  4    Period  Weeks    Status  On-going        PT Long Term Goals - 04/17/19 1610      PT LONG TERM GOAL #1   Title  Pt will demonstrate L knee AROM 0-120 degrees to improve gait mechanics and ability to navigate community.    Time  6    Period  Weeks    Status  On-going      PT LONG TERM GOAL #2   Title  Pt will self report 5/10 pain at worst with knee flexion to improve ability to perform functional activities.    Time  6    Period  Weeks    Status  On-going      PT LONG TERM GOAL #3   Title  Pt will improve FOTO score to 10% limited to represent significant improvement in ability to complete household chores with less pain.    Time  6    Period  Weeks    Status  On-going      PT LONG TERM GOAL #4   Title  Pt will tolerate lymphedema evaluation, if appropriate.    Time  6    Period  Weeks    Status  On-going            Plan - 04/23/19 1710    Clinical  Impression Statement  Began session with bike, f/b stretching, ROM exercises and manual at EOS.  Educated patient on lymphedema and how treatment could benefit his ROM and comfort/  Pt does wear compression stockings on LE but  takes them off for therapy.  Continued with contract relax with good results and noted ease of obtaining 105 degrees following manual with reduced induration as well.    Personal Factors and Comorbidities  Comorbidity 1    Comorbidities  prostate CA with lymphadenectomy in pelvis/BLE    Examination-Activity Limitations  Bend;Squat;Stairs    Examination-Participation Restrictions  Church;Yard Work    Stability/Clinical Decision Making  Stable/Uncomplicated    Rehab Potential  Good    PT Frequency  3x / week    PT Duration  6 weeks    PT Treatment/Interventions  ADLs/Self Care Home Management;Aquatic Therapy;Biofeedback;DME Instruction;Gait training;Stair training;Functional mobility training;Therapeutic activities;Therapeutic exercise;Balance training;Neuromuscular re-education;Patient/family education;Orthotic Fit/Training;Manual techniques;Manual lymph drainage;Compression bandaging;Scar mobilization;Passive range of motion;Dry needling;Taping;Joint Manipulations    PT Next Visit Plan  L knee flexion ROM and strengthening exercises. Use manual as needed to increase ROM. Consider lymphadema eval if swelling continues to be a problem.    PT Home Exercise Plan  Eval: continue HHPT exercises, add seated heel slides with RLE overpressure; 04/19/19: quad stretch and frog stretch       Patient will benefit from skilled therapeutic intervention in order to improve the following deficits and impairments:  Abnormal gait, Decreased balance, Decreased range of motion, Decreased strength, Increased edema, Increased fascial restricitons, Impaired flexibility, Improper body mechanics, Pain  Visit Diagnosis: Other symptoms and signs involving the musculoskeletal system  Stiffness of left knee, not elsewhere classified  Chronic pain of left knee     Problem List Patient Active Problem List   Diagnosis Date Noted  . Status post total left knee replacement 03/21/2019  . S/P carpal tunnel release right  02/22/19 03/08/2019  . Carpal tunnel syndrome of right wrist   . HTN (hypertension) 10/27/2018  . Prostate cancer ([place]) 05/30/2017  . Malignant neoplasm of prostate ([place]) 04/28/2017  . Chronic pain of both knees 02/21/2017  . Unilateral primary osteoarthritis, left knee 02/21/2017  . Unilateral primary osteoarthritis, right knee 02/21/2017   Teena Irani, PTA/CLT [phone]  Teena Irani 04/23/2019, 5:14 PM  [place] [address] [place], [place], [address] Phone: [phone]   Fax:  [phone]  Name: CODYLEE BAJAJ MRN: [id number] Date of Birth: 03/05/1956

## 2019-04-24 ENCOUNTER — Other Ambulatory Visit: Payer: Self-pay

## 2019-04-24 ENCOUNTER — Ambulatory Visit (HOSPITAL_COMMUNITY): Payer: 59 | Admitting: Physical Therapy

## 2019-04-24 ENCOUNTER — Telehealth: Payer: Self-pay | Admitting: Orthopaedic Surgery

## 2019-04-24 DIAGNOSIS — R29898 Other symptoms and signs involving the musculoskeletal system: Secondary | ICD-10-CM | POA: Diagnosis not present

## 2019-04-24 DIAGNOSIS — M25562 Pain in left knee: Secondary | ICD-10-CM

## 2019-04-24 DIAGNOSIS — M25662 Stiffness of left knee, not elsewhere classified: Secondary | ICD-10-CM

## 2019-04-24 DIAGNOSIS — G8929 Other chronic pain: Secondary | ICD-10-CM

## 2019-04-24 NOTE — Therapy (Signed)
[place], [place], [address] Phone: [phone]   Fax:  [phone]  Physical Therapy Treatment  Patient Details  Name: [name] MRN: [id number] Date of Birth: 04-08-1955 Referring Provider (PT): Jean Rosenthal, MD   Encounter Date: 04/24/2019  PT End of Session - 04/24/19 [id number]    Visit Number  7    Number of Visits  18    Date for PT Re-Evaluation  05/22/19    Authorization Type  [employer]    Authorization Time Period  04/10/19 to 05/22/19    Authorization - Visit Number  7    Authorization - Number of Visits  30    PT Start Time  [id number]    PT Stop Time  [id number]    PT Time Calculation (min)  40 min    Activity Tolerance  Patient tolerated treatment well;Patient limited by pain   Increased soreness at EOS   Behavior During Therapy  [facility] for tasks assessed/performed       Past Medical History:  Diagnosis Date  . Arthritis   . Diabetes mellitus without complication ([place]) [id number]   Diabetes Type II  . HTN (hypertension) 10/27/2018  . Hyperlipidemia   . Internal hemorrhoids 2014   noted on colonoscopy  . Osteoarthritis of both knees   . Pre-diabetes    questionable  . Prostate cancer [facility])    prostate  . Sigmoid diverticulosis 2014   Mild, noted on colonoscopy    Past Surgical History:  Procedure Laterality Date  . CARPAL TUNNEL RELEASE Right 02/22/2019   Procedure: CARPAL TUNNEL RELEASE;  Surgeon: Carole Civil, MD;  Location: [facility];  Service: Orthopedics;  Laterality: Right;  . COLONOSCOPY N/A 09/13/2012   Procedure: COLONOSCOPY;  Surgeon: Danie Binder, MD;  Location[facility];  Service: Endoscopy;  Laterality: N/A;  9:30 AM  . HERNIA REPAIR     20 years ago  . KNEE ARTHROSCOPY Left    15 years ago  . LYMPHADENECTOMY Bilateral 05/30/2017   Procedure: LYMPHADENECTOMY, PELVIC EXTENDED;  Surgeon: [name], MD;  Location: [facility];  Service: Urology;  Laterality: Bilateral;  .  ROBOT ASSISTED LAPAROSCOPIC RADICAL PROSTATECTOMY N/A 05/30/2017   Procedure: XI ROBOTIC ASSISTED LAPAROSCOPIC RADICAL PROSTATECTOMY LEVEL 3;  Surgeon: [name], MD;  Location: [facility];  Service: Urology;  Laterality: N/A;  . SHOULDER ARTHROSCOPY Right    15 years ago  . TOTAL KNEE ARTHROPLASTY Left 03/21/2019   Procedure: LEFT TOTAL KNEE ARTHROPLASTY;  Surgeon: Mcarthur Rossetti, MD;  Location: [facility];  Service: Orthopedics;  Laterality: Left;    There were no vitals filed for this visit.  Subjective Assessment - 04/24/19 1604    Subjective  States he is a bit sore today about 5/10 especially on the front of knees. State he wore his compression socks all day and elevating his leg and it is still swollen.         [facility] PT Assessment - 04/24/19 0001      Assessment   Medical Diagnosis  L TKA    Referring Provider (PT)  Jean Rosenthal, MD    Onset Date/Surgical Date  03/21/19    Next MD Visit  February 2021    Prior Therapy  Yes, acute care and HHPT after surgery                   [facility] Adult PT Treatment/Exercise - 04/24/19 0001  Ambulation/Gait   Stairs  Yes    Stairs Assistance  7: Independent    Stair Management Technique  No rails;Alternating pattern;Forwards    Gait Comments  4" steps - 7 steps - x10 reps up and down, 7" step, 4 steps x10 up/down      Knee/Hip Exercises: Stretches   Other Knee/Hip Stretches  knee flexion and extension stretch on 12" steo x10, 15" holds Each      Knee/Hip Exercises: Supine   Heel Slides  10 reps    Heel Slides Limitations  PT over pressure and holds x10" holds      Manual Therapy   Manual Therapy  Edema management;Joint mobilization;Soft tissue mobilization;Myofascial release;Passive ROM    Manual therapy comments  Manual complete separate than rest of tx    Edema Management  Retrograde massage with LE elevated    Joint Mobilization  tibial mobilization grade II/III with knee flexion L     Soft tissue  mobilization  Instrument assisted mobilization  to left quad, adductors and hamstrings               PT Short Term Goals - 04/17/19 1610      PT SHORT TERM GOAL #1   Title  Pt will perform HEP at least 2x/week to improve knee strength and AROM needed for functional activity.    Time  4    Period  Weeks    Status  On-going    Target Date  05/08/19      PT SHORT TERM GOAL #2   Title  Pt will demonstrate L knee AROM 0-90 degrees to improve gait mechanics and ability to navigate community.    Time  4    Period  Weeks    Status  On-going        PT Long Term Goals - 04/17/19 1610      PT LONG TERM GOAL #1   Title  Pt will demonstrate L knee AROM 0-120 degrees to improve gait mechanics and ability to navigate community.    Time  6    Period  Weeks    Status  On-going      PT LONG TERM GOAL #2   Title  Pt will self report 5/10 pain at worst with knee flexion to improve ability to perform functional activities.    Time  6    Period  Weeks    Status  On-going      PT LONG TERM GOAL #3   Title  Pt will improve FOTO score to 10% limited to represent significant improvement in ability to complete household chores with less pain.    Time  6    Period  Weeks    Status  On-going      PT LONG TERM GOAL #4   Title  Pt will tolerate lymphedema evaluation, if appropriate.    Time  6    Period  Weeks    Status  On-going            Plan - 04/24/19 1708    Clinical Impression Statement  Patient started with decreased ROM and stiffness noted in left knee today. Performed joint and tissue mobilization and this decreased swelling and improved ROM. Added stairs today and patient able to ascend and descend steps without railing and with alternating pattern without difficulties. Will continue to benefit from skilled physical therapy focusing on improving ROM and strength.    Personal Factors and Comorbidities  Comorbidity 1  Comorbidities  prostate CA with lymphadenectomy in  pelvis/BLE    Examination-Activity Limitations  Bend;Squat;Stairs    Examination-Participation Restrictions  Church;Yard Work    Stability/Clinical Decision Making  Stable/Uncomplicated    Rehab Potential  Good    PT Frequency  3x / week    PT Duration  6 weeks    PT Treatment/Interventions  ADLs/Self Care Home Management;Aquatic Therapy;Biofeedback;DME Instruction;Gait training;Stair training;Functional mobility training;Therapeutic activities;Therapeutic exercise;Balance training;Neuromuscular re-education;Patient/family education;Orthotic Fit/Training;Manual techniques;Manual lymph drainage;Compression bandaging;Scar mobilization;Passive range of motion;Dry needling;Taping;Joint Manipulations    PT Next Visit Plan  L knee flexion ROM and strengthening exercises. Use manual as needed to increase ROM. Consider lymphadema eval if swelling continues to be a problem.    PT Home Exercise Plan  Eval: continue HHPT exercises, add seated heel slides with RLE overpressure; 04/19/19: quad stretch and frog stretch       Patient will benefit from skilled therapeutic intervention in order to improve the following deficits and impairments:  Abnormal gait, Decreased balance, Decreased range of motion, Decreased strength, Increased edema, Increased fascial restricitons, Impaired flexibility, Improper body mechanics, Pain  Visit Diagnosis: Stiffness of left knee, not elsewhere classified  Chronic pain of left knee     Problem List Patient Active Problem List   Diagnosis Date Noted  . Status post total left knee replacement 03/21/2019  . S/P carpal tunnel release right 02/22/19 03/08/2019  . Carpal tunnel syndrome of right wrist   . HTN (hypertension) 10/27/2018  . Prostate cancer ([place]) 05/30/2017  . Malignant neoplasm of prostate ([place]) 04/28/2017  . Chronic pain of both knees 02/21/2017  . Unilateral primary osteoarthritis, left knee 02/21/2017  . Unilateral primary osteoarthritis, right knee  02/21/2017   5:10 PM, 04/24/19 Jerene Pitch, DPT Physical Therapy with [facility]  [phone] office  [place] [address] [place], [place], [address] Phone: [phone]   Fax:  [phone]  Name: AWAN SLAVICH MRN: [id number] Date of Birth: [birth year]

## 2019-04-24 NOTE — Telephone Encounter (Signed)
Pt called in stating he's being seen at an rehabilitation center and needs a note of clearance from Dr. Ninfa Linden.   [phone]

## 2019-04-24 NOTE — Telephone Encounter (Signed)
Therapist stating patient has a lot of lymphedema in his leg and she can work on that too if we want her to??

## 2019-04-24 NOTE — Telephone Encounter (Signed)
That will be fine for them to work on his lymphedema

## 2019-04-25 NOTE — Telephone Encounter (Signed)
Faxed to provided number from [facility] [phone]

## 2019-04-26 ENCOUNTER — Telehealth: Payer: Self-pay | Admitting: Orthopaedic Surgery

## 2019-04-26 ENCOUNTER — Other Ambulatory Visit: Payer: Self-pay

## 2019-04-26 ENCOUNTER — Ambulatory Visit (HOSPITAL_COMMUNITY): Payer: 59

## 2019-04-26 ENCOUNTER — Encounter (HOSPITAL_COMMUNITY): Payer: Self-pay

## 2019-04-26 ENCOUNTER — Other Ambulatory Visit: Payer: Self-pay | Admitting: Orthopaedic Surgery

## 2019-04-26 DIAGNOSIS — M25662 Stiffness of left knee, not elsewhere classified: Secondary | ICD-10-CM

## 2019-04-26 DIAGNOSIS — R29898 Other symptoms and signs involving the musculoskeletal system: Secondary | ICD-10-CM | POA: Diagnosis not present

## 2019-04-26 DIAGNOSIS — G8929 Other chronic pain: Secondary | ICD-10-CM

## 2019-04-26 MED ORDER — OXYCODONE HCL 5 MG PO TABS: 5.0000 mg | ORAL_TABLET | Freq: Four times a day (QID) | ORAL | 0 refills | Status: DC | PRN

## 2019-04-26 NOTE — Telephone Encounter (Signed)
Patient wife Diane called requesting a refill of oxycodone. Please send to pharmacy on file. Patient's phone number is [phone].

## 2019-04-26 NOTE — Telephone Encounter (Signed)
Please advise 

## 2019-04-26 NOTE — Therapy (Signed)
[place], [place], [address] Phone: [phone]   Fax:  [phone]  Physical Therapy Treatment  Patient Details  Name: [name] MRN: [id number] Date of Birth: 1956-03-14 Referring Provider (PT): Jean Rosenthal, MD   Encounter Date: 04/26/2019  PT End of Session - 04/26/19 1642    Visit Number  8    Number of Visits  18    Date for PT Re-Evaluation  05/22/19    Authorization Type  [employer]    Authorization Time Period  04/10/19 to 05/22/19    Authorization - Visit Number  8    Authorization - Number of Visits  30    PT Start Time  [id number] on bike, not included with charges   PT Stop Time  1658    PT Time Calculation (min)  41 min    Activity Tolerance  Patient tolerated treatment well;Patient limited by pain;No increased pain    Behavior During Therapy  WFL for tasks assessed/performed       Past Medical History:  Diagnosis Date  . Arthritis   . Diabetes mellitus without complication ([place]) [id number]   Diabetes Type II  . HTN (hypertension) 10/27/2018  . Hyperlipidemia   . Internal hemorrhoids 2014   noted on colonoscopy  . Osteoarthritis of both knees   . Pre-diabetes    questionable  . Prostate cancer [facility])    prostate  . Sigmoid diverticulosis 2014   Mild, noted on colonoscopy    Past Surgical History:  Procedure Laterality Date  . CARPAL TUNNEL RELEASE Right 02/22/2019   Procedure: CARPAL TUNNEL RELEASE;  Surgeon: Carole Civil, MD;  Location: [facility];  Service: Orthopedics;  Laterality: Right;  . COLONOSCOPY N/A 09/13/2012   Procedure: COLONOSCOPY;  Surgeon: Danie Binder, MD;  [facility];  Service: Endoscopy;  Laterality: N/A;  9:30 AM  . HERNIA REPAIR     20 years ago  . KNEE ARTHROSCOPY Left    15 years ago  . LYMPHADENECTOMY Bilateral 05/30/2017   Procedure: LYMPHADENECTOMY, PELVIC EXTENDED;  Surgeon: [name], MD;  [facility];  Service: Urology;   Laterality: Bilateral;  . ROBOT ASSISTED LAPAROSCOPIC RADICAL PROSTATECTOMY N/A 05/30/2017   Procedure: XI ROBOTIC ASSISTED LAPAROSCOPIC RADICAL PROSTATECTOMY LEVEL 3;  Surgeon: [name], MD;  [facility];  Service: Urology;  Laterality: N/A;  . SHOULDER ARTHROSCOPY Right    15 years ago  . TOTAL KNEE ARTHROPLASTY Left 03/21/2019   Procedure: LEFT TOTAL KNEE ARTHROPLASTY;  Surgeon: Mcarthur Rossetti, MD;  [facility];  Service: Orthopedics;  Laterality: Left;    There were no vitals filed for this visit.  Subjective Assessment - 04/26/19 1635    Subjective  Pt stated knee is stiff today, pain scale 3-4/10.  Pt feels the swelling is going down some, continues to wear compression garment and have been elevating leg.  Hasn't iced knee in a couple days.    Patient Stated Goals  "I want you to get this thing to bend without hurting"    Currently in Pain?  Yes    Pain Score  4     Pain Location  Knee    Pain Orientation  Left    Pain Descriptors / Indicators  Aching;Tightness    Pain Type  Surgical pain    Pain Onset  1 to 4 weeks ago    Pain Frequency  Constant    Aggravating Factors  sitting wiht constant bend, flexing knee    Pain Relieving Factors  muscle relaxors, oxycodone    Effect of Pain on Daily Activities  limited                       [facility] Adult PT Treatment/Exercise - 04/26/19 0001      Ambulation/Gait   Stairs  Yes    Stairs Assistance  7: Independent    Stair Management Technique  No rails;Alternating pattern;Forwards    Gait Comments  7in step: 5RT alternating      Exercises   Exercises  Knee/Hip      Knee/Hip Exercises: Stretches   Sports administrator  3 reps;30 seconds    Quad Stretch Limitations  prone with rope    Knee: Self-Stretch to increase Flexion  5 reps;10 seconds;Left    Knee: Self-Stretch Limitations  knee drive on [id number] step       Knee/Hip Exercises: Machines for Strengthening   Other Machine  Power tower 25 degrees  10x 20" flexion stretch; 10 squats      Knee/Hip Exercises: Seated   Heel Slides  Left;10 reps    Heel Slides Limitations  RLE overpressure, 5 sec hold      Knee/Hip Exercises: Supine   Heel Slides  10 reps    Knee Flexion  AROM    Knee Flexion Limitations  106      Knee/Hip Exercises: Prone   Hamstring Curl  10 reps    Contract/Relax to Increase Flexion  5x 10" for flexion    Other Prone Exercises  prone TKE, LLE 10 reps, 3 sec hold      Manual Therapy   Manual Therapy  Edema management;Joint mobilization;Soft tissue mobilization;Myofascial release;Passive ROM    Manual therapy comments  Manual complete separate than rest of tx    Edema Management  Retrograde massage with LE elevated    Joint Mobilization  patella mobs all direction    Soft tissue mobilization  quadriceps wtih LE elevated    Myofascial Release  proximal scar tissue adhesions    Passive ROM  contract-relax knee flexion in prone, 10 sec contract to therapist resistance, followed by 5 sec relax               PT Short Term Goals - 04/17/19 1610      PT SHORT TERM GOAL #1   Title  Pt will perform HEP at least 2x/week to improve knee strength and AROM needed for functional activity.    Time  4    Period  Weeks    Status  On-going    Target Date  05/08/19      PT SHORT TERM GOAL #2   Title  Pt will demonstrate L knee AROM 0-90 degrees to improve gait mechanics and ability to navigate community.    Time  4    Period  Weeks    Status  On-going        PT Long Term Goals - 04/17/19 1610      PT LONG TERM GOAL #1   Title  Pt will demonstrate L knee AROM 0-120 degrees to improve gait mechanics and ability to navigate community.    Time  6    Period  Weeks    Status  On-going      PT LONG TERM GOAL #2   Title  Pt will self report 5/10 pain at worst with knee flexion to improve ability to perform functional activities.  Time  6    Period  Weeks    Status  On-going      PT LONG TERM GOAL #3    Title  Pt will improve FOTO score to 10% limited to represent significant improvement in ability to complete household chores with less pain.    Time  6    Period  Weeks    Status  On-going      PT LONG TERM GOAL #4   Title  Pt will tolerate lymphedema evaluation, if appropriate.    Time  6    Period  Weeks    Status  On-going            Plan - 04/26/19 1802    Clinical Impression Statement  Pt continues to have edema present Lt LE.  Reviewed elevation, wearing compression garments and use of ice.  Pt encouraged to increased application of ice for edema control, verbalized understanding.  Manual retrograde massage, joint mobs and PROM as tolerated complete to assist with mobility.  Noted significant pitting edema during contract/relax in prone.  Pt with lymphedema eval next week.  EOS AROM at 0-106 degrees, limited by stiffness and some increased soreness.    Personal Factors and Comorbidities  Comorbidity 1    Comorbidities  prostate CA with lymphadenectomy in pelvis/BLE    Examination-Activity Limitations  Bend;Squat;Stairs    Examination-Participation Restrictions  Church;Yard Work    Stability/Clinical Decision Making  Stable/Uncomplicated    Designer, jewellery  Low    Rehab Potential  Good    PT Frequency  3x / week    PT Duration  6 weeks    PT Treatment/Interventions  ADLs/Self Care Home Management;Aquatic Therapy;Biofeedback;DME Instruction;Gait training;Stair training;Functional mobility training;Therapeutic activities;Therapeutic exercise;Balance training;Neuromuscular re-education;Patient/family education;Orthotic Fit/Training;Manual techniques;Manual lymph drainage;Compression bandaging;Scar mobilization;Passive range of motion;Dry needling;Taping;Joint Manipulations    PT Next Visit Plan  L knee flexion ROM and strengthening exercises. Use manual as needed to increase ROM. Consider lymphadema eval if swelling continues to be a problem.    PT Home Exercise Plan  Eval:  continue HHPT exercises, add seated heel slides with RLE overpressure; 04/19/19: quad stretch and frog stretch       Patient will benefit from skilled therapeutic intervention in order to improve the following deficits and impairments:  Abnormal gait, Decreased balance, Decreased range of motion, Decreased strength, Increased edema, Increased fascial restricitons, Impaired flexibility, Improper body mechanics, Pain  Visit Diagnosis: Other symptoms and signs involving the musculoskeletal system  Chronic pain of left knee  Stiffness of left knee, not elsewhere classified     Problem List Patient Active Problem List   Diagnosis Date Noted  . Status post total left knee replacement 03/21/2019  . S/P carpal tunnel release right 02/22/19 03/08/2019  . Carpal tunnel syndrome of right wrist   . HTN (hypertension) 10/27/2018  . Prostate cancer ([facility]) 05/30/2017  . Malignant neoplasm of prostate ([place]) 04/28/2017  . Chronic pain of both knees 02/21/2017  . Unilateral primary osteoarthritis, left knee 02/21/2017  . Unilateral primary osteoarthritis, right knee 02/21/2017   Ihor Austin, LPTA; CBIS [phone]  Aldona Lento 04/26/2019, 6:15 PM  [place] [address] [place], [place], [address] Phone: [phone]   Fax:  [phone]  Name: [name] MRN: [id number] Date of Birth: 1955-11-27

## 2019-05-01 ENCOUNTER — Other Ambulatory Visit: Payer: Self-pay

## 2019-05-01 ENCOUNTER — Ambulatory Visit (HOSPITAL_COMMUNITY): Payer: 59

## 2019-05-01 ENCOUNTER — Encounter (HOSPITAL_COMMUNITY): Payer: Self-pay

## 2019-05-01 DIAGNOSIS — R29898 Other symptoms and signs involving the musculoskeletal system: Secondary | ICD-10-CM

## 2019-05-01 DIAGNOSIS — M25662 Stiffness of left knee, not elsewhere classified: Secondary | ICD-10-CM

## 2019-05-01 DIAGNOSIS — M25562 Pain in left knee: Secondary | ICD-10-CM

## 2019-05-01 DIAGNOSIS — G8929 Other chronic pain: Secondary | ICD-10-CM

## 2019-05-01 NOTE — Therapy (Signed)
[place], [place], [address] Phone: [phone]   Fax:  [phone]  Physical Therapy Treatment  Patient Details  Name: [name] MRN: [id number] Date of Birth: Oct 31, 1955 Referring Provider (PT): Jean Rosenthal, MD   Encounter Date: 05/01/2019  PT End of Session - 05/01/19 1620    Visit Number  9    Number of Visits  18    Date for PT Re-Evaluation  05/22/19    Authorization Type  [employer]    Authorization Time Period  04/10/19 to 05/22/19    Authorization - Visit Number  9    Authorization - Number of Visits  30    PT Start Time  [id number]    PT Stop Time  [id number]    PT Time Calculation (min)  39 min    Activity Tolerance  Patient tolerated treatment well;No increased pain    Behavior During Therapy  WFL for tasks assessed/performed       Past Medical History:  Diagnosis Date  . Arthritis   . Diabetes mellitus without complication ([place]) [id number]   Diabetes Type II  . HTN (hypertension) 10/27/2018  . Hyperlipidemia   . Internal hemorrhoids 2014   noted on colonoscopy  . Osteoarthritis of both knees   . Pre-diabetes    questionable  . Prostate cancer [facility])    prostate  . Sigmoid diverticulosis 2014   Mild, noted on colonoscopy    Past Surgical History:  Procedure Laterality Date  . CARPAL TUNNEL RELEASE Right 02/22/2019   Procedure: CARPAL TUNNEL RELEASE;  Surgeon: Carole Civil, MD;  Location: [facility];  Service: Orthopedics;  Laterality: Right;  . COLONOSCOPY N/A 09/13/2012   Procedure: COLONOSCOPY;  Surgeon: Danie Binder, MD;  Location[facility];  Service: Endoscopy;  Laterality: N/A;  9:30 AM  . HERNIA REPAIR     20 years ago  . KNEE ARTHROSCOPY Left    15 years ago  . LYMPHADENECTOMY Bilateral 05/30/2017   Procedure: LYMPHADENECTOMY, PELVIC EXTENDED;  Surgeon: [name], MD;  [facility]: [facility];  Service: Urology;  Laterality: Bilateral;  . ROBOT ASSISTED LAPAROSCOPIC RADICAL  PROSTATECTOMY N/A 05/30/2017   Procedure: XI ROBOTIC ASSISTED LAPAROSCOPIC RADICAL PROSTATECTOMY LEVEL 3;  Surgeon: [name], MD;  Location: [facility];  Service: Urology;  Laterality: N/A;  . SHOULDER ARTHROSCOPY Right    15 years ago  . TOTAL KNEE ARTHROPLASTY Left 03/21/2019   Procedure: LEFT TOTAL KNEE ARTHROPLASTY;  Surgeon: Mcarthur Rossetti, MD;  [facility]: [facility];  Service: Orthopedics;  Laterality: Left;    There were no vitals filed for this visit.  Subjective Assessment - 05/01/19 1618    Subjective  Pt reports he rode a bicycle at home today for 10 min and was able to go all the way around. Pt reports has lymphedema eval tomorrow for PT and sees his surgeon tomorrow after. Pt reports taking fluid pill for past 2 days and he has noticed a difference.    How long can you sit comfortably?  5-10 minutes    How long can you stand comfortably?  no issues    How long can you walk comfortably?  no issues    Patient Stated Goals  "I want you to get this thing to bend without hurting"    Currently in Pain?  No/denies         [facility] PT Assessment - 05/01/19 0001      Assessment   Medical  Diagnosis  L TKA    Referring Provider (PT)  Jean Rosenthal, MD    Onset Date/Surgical Date  03/21/19    Next MD Visit  February 2021    Prior Therapy  Yes, acute care and HHPT after surgery      Precautions   Precautions  None      Restrictions   Weight Bearing Restrictions  No      Balance Screen   Has the patient fallen in the past 6 months  No    Has the patient had a decrease in activity level because of a fear of falling?   No    Is the patient reluctant to leave their home because of a fear of falling?   No      Functional Tests   Functional tests  Sit to Stand      Sit to Stand   Comments  5x STS: 8.75 sec, from chair, no UE assist, equal weight-bearing   was 11.2 sec, weight shifted to RLE, LLE extended     AROM   Left Knee Extension  2   was 4   Left Knee  Flexion  104   was 78     Balance   Balance Assessed  Yes      Static Standing Balance   Static Standing - Balance Support  No upper extremity supported    Static Standing Balance -  Activities   Single Leg Stance - Right Leg;Single Leg Stance - Left Leg    Static Standing - Comment/# of Minutes  R: 30 sec, L: 20 sec          [facility] Adult PT Treatment/Exercise - 05/01/19 0001      Knee/Hip Exercises: Stretches   Knee: Self-Stretch Limitations  10x10 sec, knee drive on 12" step      Knee/Hip Exercises: Aerobic   Stationary Bike  half revolutions, seat 13, 4 min      Knee/Hip Exercises: Machines for Strengthening   Other Machine  bodycraft leg press, 50# x10 reps, 70# 2x10 reps      Knee/Hip Exercises: Standing   Heel Raises  20 reps    Forward Step Up  Left;15 reps;Hand Hold: 0;Step Height: 8"    Wall Squat  10 reps;5 seconds    Wall Squat Limitations  wall sit     SLS  R: 30 sec best, L: 20 sec best    Other Standing Knee Exercises  tandem stance, each LE back, 30+ sec    Other Standing Knee Exercises  lateral steps, BTB around ankles, x2RT; heel taps from 4" box, LLE, x10 reps      Knee/Hip Exercises: Seated   Sit to Sand  5 reps   equal weight-bearing     Knee/Hip Exercises: Prone   Contract/Relax to Increase Flexion  10x5" for flexion    Other Prone Exercises  prone TKE, LLE 15 reps, 5 sec hold             PT Education - 05/01/19 1619    Education Details  Exercise technique, continue HEP    Person(s) Educated  Patient    Methods  Explanation    Comprehension  Verbalized understanding       PT Short Term Goals - 04/17/19 1610      PT SHORT TERM GOAL #1   Title  Pt will perform HEP at least 2x/week to improve knee strength and AROM needed for functional activity.    Time  4    Period  Weeks    Status  On-going    Target Date  05/08/19      PT SHORT TERM GOAL #2   Title  Pt will demonstrate L knee AROM 0-90 degrees to improve gait mechanics and  ability to navigate community.    Time  4    Period  Weeks    Status  On-going        PT Long Term Goals - 04/17/19 1610      PT LONG TERM GOAL #1   Title  Pt will demonstrate L knee AROM 0-120 degrees to improve gait mechanics and ability to navigate community.    Time  6    Period  Weeks    Status  On-going      PT LONG TERM GOAL #2   Title  Pt will self report 5/10 pain at worst with knee flexion to improve ability to perform functional activities.    Time  6    Period  Weeks    Status  On-going      PT LONG TERM GOAL #3   Title  Pt will improve FOTO score to 10% limited to represent significant improvement in ability to complete household chores with less pain.    Time  6    Period  Weeks    Status  On-going      PT LONG TERM GOAL #4   Title  Pt will tolerate lymphedema evaluation, if appropriate.    Time  6    Period  Weeks    Status  On-going            Plan - 05/01/19 1653    Clinical Impression Statement  Began with bicycle and knee drives for knee flexion stretch. Pt able to perform STS reps with equal weight-bearing and at increased pace without increased pain. Added forward step ups, heel taps, and wall sits without increased knee pain and with good form requiring intermittent verbal cues. Pt L knee AROM 2-104 degrees this date. Continue to progress as able.    Personal Factors and Comorbidities  Comorbidity 1    Comorbidities  prostate CA with lymphadenectomy in pelvis/BLE    Examination-Activity Limitations  Bend;Squat;Stairs    Examination-Participation Restrictions  Church;Yard Work    Stability/Clinical Decision Making  Stable/Uncomplicated    Rehab Potential  Good    PT Frequency  3x / week    PT Duration  6 weeks    PT Treatment/Interventions  ADLs/Self Care Home Management;Aquatic Therapy;Biofeedback;DME Instruction;Gait training;Stair training;Functional mobility training;Therapeutic activities;Therapeutic exercise;Balance training;Neuromuscular  re-education;Patient/family education;Orthotic Fit/Training;Manual techniques;Manual lymph drainage;Compression bandaging;Scar mobilization;Passive range of motion;Dry needling;Taping;Joint Manipulations    PT Next Visit Plan  Lymphedema eval    PT Home Exercise Plan  Eval: continue HHPT exercises, add seated heel slides with RLE overpressure; 04/19/19: quad stretch and frog stretch    Consulted and Agree with Plan of Care  Patient       Patient will benefit from skilled therapeutic intervention in order to improve the following deficits and impairments:  Abnormal gait, Decreased balance, Decreased range of motion, Decreased strength, Increased edema, Increased fascial restricitons, Impaired flexibility, Improper body mechanics, Pain  Visit Diagnosis: Chronic pain of left knee  Stiffness of left knee, not elsewhere classified  Other symptoms and signs involving the musculoskeletal system     Problem List Patient Active Problem List   Diagnosis Date Noted  . Status post total left knee replacement 03/21/2019  .  S/P carpal tunnel release right 02/22/19 03/08/2019  . Carpal tunnel syndrome of right wrist   . HTN (hypertension) 10/27/2018  . Prostate cancer ([place]) 05/30/2017  . Malignant neoplasm of prostate ([place]) 04/28/2017  . Chronic pain of both knees 02/21/2017  . Unilateral primary osteoarthritis, left knee 02/21/2017  . Unilateral primary osteoarthritis, right knee 02/21/2017     Talbot Grumbling PT, DPT 05/01/19, 4:57 PM [place] [address] [place], [place], [address] Phone: [phone]   Fax:  [phone]  Name: ESTEPHEN LANGENFELD MRN: [id number] Date of Birth: [birth year]

## 2019-05-02 ENCOUNTER — Other Ambulatory Visit: Payer: Self-pay

## 2019-05-02 ENCOUNTER — Encounter: Payer: Self-pay | Admitting: Orthopaedic Surgery

## 2019-05-02 ENCOUNTER — Ambulatory Visit (HOSPITAL_COMMUNITY): Payer: 59 | Admitting: Physical Therapy

## 2019-05-02 ENCOUNTER — Ambulatory Visit (INDEPENDENT_AMBULATORY_CARE_PROVIDER_SITE_OTHER): Payer: 59 | Admitting: Orthopaedic Surgery

## 2019-05-02 DIAGNOSIS — I890 Lymphedema, not elsewhere classified: Secondary | ICD-10-CM

## 2019-05-02 DIAGNOSIS — R29898 Other symptoms and signs involving the musculoskeletal system: Secondary | ICD-10-CM | POA: Diagnosis not present

## 2019-05-02 DIAGNOSIS — Z96652 Presence of left artificial knee joint: Secondary | ICD-10-CM

## 2019-05-02 MED ORDER — OXYCODONE HCL 5 MG PO TABS: 5.0000 mg | ORAL_TABLET | ORAL | 0 refills | Status: DC | PRN

## 2019-05-02 NOTE — Progress Notes (Signed)
HPI: [name] returns today 6 weeks status post left total knee arthroplasty.  He is overall doing well.  He states he is 34 degrees of flexion at therapy.  Feels his little stiff this Beard.  Otherwise has no complaints.  He notes his swelling is gone down after taking his fluid pills and using his compression stockings.  Physical exam: Left knee full extension flexion to 95 degrees.  Calf supple nontender.  No instability valgus varus stressing.  Surgical incision is healing well there is 1 area slight wound dehiscence proximal incision but no signs of gross infection.  Impression: Status post left total knee arthroplasty 03/21/2019  Plan: He will continue to work on range of motion strengthening.  We will see him back in 1 month to see what type of progress he is made.  Refill on his oxycodone was given.  Questions encouraged and answered

## 2019-05-02 NOTE — Therapy (Addendum)
[place], [place], [address] Phone: [phone]   Fax:  [phone]  Physical Therapy Evaluation  Patient Details  Name: [name] MRN: [id number] Date of Birth: 05-04-1955 Referring Provider (PT): Jean Rosenthal, MD   Encounter Date: 05/02/2019  PT End of Session - 05/02/19 1518    Visit Number  1    Number of Visits  9    Date for PT Re-Evaluation  05/23/19    Authorization Type  [employer]- authorization put in    Authorization - Visit Number  1    Authorization - Number of Visits  9    PT Start Time  1140    PT Stop Time  1220    PT Time Calculation (min)  40 min    Activity Tolerance  Patient tolerated treatment well    Behavior During Therapy  [facility] for tasks assessed/performed       Past Medical History:  Diagnosis Date  . Arthritis   . Diabetes mellitus without complication ([place]) XX123456   Diabetes Type II  . HTN (hypertension) 10/27/2018  . Hyperlipidemia   . Internal hemorrhoids 2014   noted on colonoscopy  . Osteoarthritis of both knees   . Pre-diabetes    questionable  . Prostate cancer [facility])    prostate  . Sigmoid diverticulosis 2014   Mild, noted on colonoscopy    Past Surgical History:  Procedure Laterality Date  . CARPAL TUNNEL RELEASE Right 02/22/2019   Procedure: CARPAL TUNNEL RELEASE;  Surgeon: Carole Civil, MD;  Location: [facility];  Service: Orthopedics;  Laterality: Right;  . COLONOSCOPY N/A 09/13/2012   Procedure: COLONOSCOPY;  Surgeon: Danie Binder, MD;  [facility];  Service: Endoscopy;  Laterality: N/A;  9:30 AM  . HERNIA REPAIR     20 years ago  . KNEE ARTHROSCOPY Left    15 years ago  . LYMPHADENECTOMY Bilateral 05/30/2017   Procedure: LYMPHADENECTOMY, PELVIC EXTENDED;  Surgeon: [name], MD;  [facility];  Service: Urology;  Laterality: Bilateral;  . ROBOT ASSISTED LAPAROSCOPIC RADICAL PROSTATECTOMY N/A 05/30/2017   Procedure: XI  ROBOTIC ASSISTED LAPAROSCOPIC RADICAL PROSTATECTOMY LEVEL 3;  Surgeon: [name], MD;  [facility];  Service: Urology;  Laterality: N/A;  . SHOULDER ARTHROSCOPY Right    15 years ago  . TOTAL KNEE ARTHROPLASTY Left 03/21/2019   Procedure: LEFT TOTAL KNEE ARTHROPLASTY;  Surgeon: Mcarthur Rossetti, MD;  [facility];  Service: Orthopedics;  Laterality: Left;    There were no vitals filed for this visit.   Subjective Assessment - 05/02/19 1450    Subjective  PT states that he was diagnoses with adenocarcinoma of the prostate in March of 2019.  He had B pelvic lymphectomy and had 23 lymph nodes removed. Both of his legs have swelled ever since but the Lt leg is more involved compared to the right.  He recently had a Left Total knee replacement and feels that the swelling is affecting his ROM.  He states that immediately after his lymphectomy he wore his compression garment, (he has thigh high), but he quit until after his knee surgery.  The pt states that they are very difficult to put on, he does not have a compression pump.    Pertinent History  Carpal tunnel release, LT TKR< lymphadenectomy, prostatectomy, TSR    Currently in Pain?  No/denies   not from lymphedema, pain with pushing ROM  [facility] PT Assessment - 05/02/19 0001      Assessment   Medical Diagnosis  B LE lymphedema     Referring Provider (PT)  Jean Rosenthal, MD    Onset Date/Surgical Date 06/08/2017   Next MD Visit  February 2021    Prior Therapy  Yes, acute care and HHPT after surgery      Precautions   Precautions  --   cellulitis     Restrictions   Weight Bearing Restrictions  No      Balance Screen   Has the patient fallen in the past 6 months  No    Has the patient had a decrease in activity level because of a fear of falling?   No    Is the patient reluctant to leave their home because of a fear of falling?   No      Prior Function   Level of Independence  Independent       Cognition   Overall Cognitive Status  Within Functional Limits for tasks assessed      Observation/Other Assessments   Skin Integrity  B LE skin is very dry and flaky.    Focus on Therapeutic Outcomes (FOTO)   --   Life impact for lymphedema: 38     Functional Tests   Functional tests  --      Sit to Stand   Comments  --      AROM   Left Knee Extension  --    Left Knee Flexion  105          LYMPHEDEMA/ONCOLOGY QUESTIONNAIRE - 05/02/19 1212      Surgeries   Lymphectomy  Date  05/30/17    Number Lymph Nodes Removed  23      Treatment   Past Radiation Treatment  Yes    Body Site  pelvic 38 rx      What other symptoms do you have   Are you Having Heaviness or Tightness  Yes    Are you having Pain  No    Are you having pitting edema  Yes    Body Site  LE    Is it Hard or Difficult finding clothes that fit  Yes      Lymphedema Stage   Stage  STAGE 2 SPONTANEOUSLY IRREVERSIBLE      Lymphedema Assessments   Lymphedema Assessments  Lower extremities      Right Lower Extremity Lymphedema   30 cm Proximal to Suprapatella    20 cm Proximal to Suprapatella 55.8cm    10 cm Proximal to Suprapatella  48.7 cm    At Midpatella/Popliteal Crease  38.7 cm    30 cm Proximal to Floor at Lateral Plantar Foot  39.7 cm    20 cm Proximal to Floor at Lateral Plantar Foot 29.3   10 cm Proximal to Floor at Lateral Malleoli  24 cm    Circumference of ankle/heel  34.7 cm.    5 cm Proximal to 1st MTP Joint  24 cm    Across MTP Joint  24.5 cm      Left Lower Extremity Lymphedema   20 cm Proximal to Suprapatella  56.5 cm    10 cm Proximal to Suprapatella  52.3 cm    At Midpatella/Popliteal Crease  44.2 cm    30 cm Proximal to Floor at Lateral Plantar Foot  38.2 cm    20 cm Proximal to Floor at Lateral Plantar Foot  28.2 cm  10 cm Proximal to Floor at Lateral Malleoli  27.3 cm    Circumference of ankle/heel  35.5 cm.    5 cm Proximal to 1st MTP Joint  23.7 cm    Across MTP Joint  23.8  cm             Objective measurements completed on examination: See above findings.              PT Education - 05/02/19 1517    Education Details  Pt educated on what lymphedema is, what causes it, how it is treated and the maintenance phase of the treatment.    Person(s) Educated  Patient    Methods  Explanation    Comprehension  Verbalized understanding       PT Short Term Goals - 05/02/19 1528      PT SHORT TERM GOAL #1   Title  PT Rt LE to reduce 1-2 cm ; LT to reduce 2 cm    Time  10    Period  Days    Status  New    Target Date  05/18/19      PT SHORT TERM GOAL #2   Title  PT to be cleansing and moisturizing LE on a daily basis to improve skin integrity    Time  10    Period  Days    Status  New      PT SHORT TERM GOAL #3   Title  PT to be able to verbalize that he understands that Lymphedema is a chronic condition and he will need to manage it or his edema will return.    Time  10    Period  Days    Status  New        PT Long Term Goals - 05/02/19 1530      PT LONG TERM GOAL #1   Title  Pt Volume in his Lt LE to be decreased by 3 cm    Time  3    Period  Weeks    Status  New    Target Date  05/29/19      PT LONG TERM GOAL #2   Title  PT to have obtained and be using a compression pump on a daily basis    Time  3    Period  Weeks    Status  New      PT LONG TERM GOAL #3   Title  Pt to be able to bend his Lt knee to 115 due to reduced edema to be able to squat to pick items off of the floor.    Time  3    Period  Weeks    Status  New             Plan - 05/02/19 1519    Clinical Impression Statement  Mr. Ryals is a 64 yo male who has been referred to skilled physcial therapy for B lymphedema.  Evaluation demonstrates increased induration B, decreased skin integrity B and general lack of knowledge of lymphedema.  Mr. Eilers will benefit from skilled PT to reduce his volume to minimal load to decrease the risk of cellulitis,  increased understanding of the chronic nature of lymphedema and that if it is not managed it will continue to worsen.    Personal Factors and Comorbidities  Comorbidity 2    Comorbidities  Lt TKR, Adenocarcinoma of prostate with lymphectomy of 38 nodes.    Examination-Activity Limitations  Dressing  Stability/Clinical Decision Making  Stable/Uncomplicated    Clinical Decision Making  Low    Rehab Potential  Good    PT Frequency  3x / week    PT Duration  3 weeks    PT Treatment/Interventions  ADLs/Self Care Home Management;Patient/family education;Manual lymph drainage;Compression bandaging    PT Next Visit Plan  cut foam for L LE, begin total decongestive techniques to include manual lymph drainage to B LE with compression bandaging to LT only  and compression bandaging .  If pt has not brought in compression garment request that he puts compression garment on his Rt LE immediately when he gets home.    PT Home Exercise Plan  Pt completing exercises for recent TKR       Patient will benefit from skilled therapeutic intervention in order to improve the following deficits and impairments:  Decreased skin integrity, Increased edema, Decreased range of motion  Visit Diagnosis: Lymphedema, not elsewhere classified     Problem List Patient Active Problem List   Diagnosis Date Noted  . Status post total left knee replacement 03/21/2019  . S/P carpal tunnel release right 02/22/19 03/08/2019  . Carpal tunnel syndrome of right wrist   . HTN (hypertension) 10/27/2018  . Prostate cancer ([place]) 05/30/2017  . Malignant neoplasm of prostate ([place]) 04/28/2017  . Chronic pain of both knees 02/21/2017  . Unilateral primary osteoarthritis, left knee 02/21/2017  . Unilateral primary osteoarthritis, right knee 02/21/2017    Rayetta Humphrey, PT CLT [phone] 05/02/2019, 3:33 PM  [place] [address] [place], [place], [address] Phone: [phone]    Fax:  [phone]  Name: [name] MRN: [id number] Date of Birth: [birth year]

## 2019-05-03 ENCOUNTER — Ambulatory Visit (HOSPITAL_COMMUNITY): Payer: 59 | Admitting: Physical Therapy

## 2019-05-03 ENCOUNTER — Encounter (HOSPITAL_COMMUNITY): Payer: Self-pay | Admitting: Physical Therapy

## 2019-05-03 DIAGNOSIS — M25662 Stiffness of left knee, not elsewhere classified: Secondary | ICD-10-CM

## 2019-05-03 DIAGNOSIS — R29898 Other symptoms and signs involving the musculoskeletal system: Secondary | ICD-10-CM | POA: Diagnosis not present

## 2019-05-03 DIAGNOSIS — M25562 Pain in left knee: Secondary | ICD-10-CM

## 2019-05-03 DIAGNOSIS — G8929 Other chronic pain: Secondary | ICD-10-CM

## 2019-05-03 NOTE — Therapy (Signed)
[facility] [address] [place], [place], [address] Phone: [phone]   Fax:  [phone]  Physical Therapy Treatment and Progress Note  Patient Details  Name: [name] MRN: [id number] Date of Birth: May 12, 1955 Referring Provider (PT): Jean Rosenthal, MD  Progress Note Reporting Period 04/10/19 to 05/03/19  See note below for Objective Data and Assessment of Progress/Goals.       Encounter Date: 05/03/2019  PT End of Session - 05/03/19 1608    Visit Number  10    Number of Visits  18    Date for PT Re-Evaluation  05/22/19    Authorization Type  [employer]    Authorization Time Period  04/10/19 to 05/22/19    Authorization - Visit Number  11   ADDED LYMPH VISIT TO TOTAL COUNT   Authorization - Number of Visits  30    PT Start Time  8676    PT Stop Time  1650    PT Time Calculation (min)  40 min    Activity Tolerance  Patient tolerated treatment well;No increased pain    Behavior During Therapy  WFL for tasks assessed/performed       Past Medical History:  Diagnosis Date  . Arthritis   . Diabetes mellitus without complication ([place]) 7209   Diabetes Type II  . HTN (hypertension) 10/27/2018  . Hyperlipidemia   . Internal hemorrhoids 2014   noted on colonoscopy  . Osteoarthritis of both knees   . Pre-diabetes    questionable  . Prostate cancer [facility])    prostate  . Sigmoid diverticulosis 2014   Mild, noted on colonoscopy    Past Surgical History:  Procedure Laterality Date  . CARPAL TUNNEL RELEASE Right 02/22/2019   Procedure: CARPAL TUNNEL RELEASE;  Surgeon: Carole Civil, MD;  Location: [facility];  Service: Orthopedics;  Laterality: Right;  . COLONOSCOPY N/A 09/13/2012   Procedure: COLONOSCOPY;  Surgeon: Danie Binder, MD;  Location[facility];  Service: Endoscopy;  Laterality: N/A;  9:30 AM  . HERNIA REPAIR     20 years ago  . KNEE ARTHROSCOPY Left    15 years ago  . LYMPHADENECTOMY Bilateral  05/30/2017   Procedure: LYMPHADENECTOMY, PELVIC EXTENDED;  Surgeon: [name], MD;  [facility]: [facility];  Service: Urology;  Laterality: Bilateral;  . ROBOT ASSISTED LAPAROSCOPIC RADICAL PROSTATECTOMY N/A 05/30/2017   Procedure: XI ROBOTIC ASSISTED LAPAROSCOPIC RADICAL PROSTATECTOMY LEVEL 3;  Surgeon: [name], MD;  [facility]: [facility];  Service: Urology;  Laterality: N/A;  . SHOULDER ARTHROSCOPY Right    15 years ago  . TOTAL KNEE ARTHROPLASTY Left 03/21/2019   Procedure: LEFT TOTAL KNEE ARTHROPLASTY;  Surgeon: Mark Rossetti, MD;  [facility];  Service: Orthopedics;  Laterality: Left;    There were no vitals filed for this visit.  Subjective Assessment - 05/03/19 1608    Subjective  No pain today. States he feels 60% better. Bending is his greatest limitation. can go up and down stairs but still has tightness.    Pertinent History  Carpal tunnel release, LT TKR< lymphadenectomy, prostatectomy, TSR         [facility] PT Assessment - 05/03/19 0001      Assessment   Medical Diagnosis  L TKA    Referring Provider (PT)  Jean Rosenthal, MD    Onset Date/Surgical Date  03/21/19    Next MD Visit  February 2021    Prior Therapy  Yes, acute care and HHPT  after surgery      AROM   Left Knee Extension  2    Left Knee Flexion  110   pain      Strength   Right Knee Flexion  5/5    Right Knee Extension  5/5    Left Knee Flexion  5/5    Left Knee Extension  5/5    Right Ankle Dorsiflexion  5/5    Left Ankle Dorsiflexion  5/5                   [facility] Adult PT Treatment/Exercise - 05/03/19 0001      Knee/Hip Exercises: Stretches   Gastroc Stretch  Left;5 reps;60 seconds   belt around back and leg with active TKE every 5" seconds     Knee/Hip Exercises: Aerobic   Stationary Bike  full rotation - started at seat 13 progressed to seat position 10 for ROM - 3-4 minutes in each position, backwards pedal first, then forwards then forwards with DF prior to  progressing forward             PT Education - 05/03/19 1724    Education Details  on current condition, progress made, plan moving forward    Person(s) Educated  Patient    Methods  Explanation    Comprehension  Verbalized understanding       PT Short Term Goals - 05/03/19 1645      PT SHORT TERM GOAL #1   Title  Pt will perform HEP at least 2x/week to improve knee strength and AROM needed for functional activity.    Time  4    Period  Weeks    Status  Achieved    Target Date  05/08/19      PT SHORT TERM GOAL #2   Title  Pt will demonstrate L knee AROM 0-90 degrees to improve gait mechanics and ability to navigate community.    Baseline  2-104    Time  4    Period  Weeks    Status  Achieved        PT Long Term Goals - 05/03/19 1645      PT LONG TERM GOAL #1   Title  Pt will demonstrate L knee AROM 0-120 degrees to improve gait mechanics and ability to navigate community.    Time  6    Period  Weeks    Status  On-going      PT LONG TERM GOAL #2   Title  Pt will self report 5/10 pain at worst with knee flexion to improve ability to perform functional activities.    Baseline  8/10 with end range pressure    Time  6    Period  Weeks    Status  On-going      PT LONG TERM GOAL #3   Title  Pt will improve FOTO score to 10% limited to represent significant improvement in ability to complete household chores with less pain.    Baseline  see objective section    Time  6    Period  Weeks    Status  On-going      PT LONG TERM GOAL #4   Title  Pt will tolerate lymphedema evaluation, if appropriate.    Time  6    Period  Weeks    Status  On-going            Plan - 05/03/19 1609    Clinical Impression Statement  Patient  present for a progress note on this date. He has met all short term goals and is working towards long term goals at this time. Patient is doing very well and able to achieve 110 degrees of knee flexion on this date. Patient to start  lymphedema treatments soon. Discussed progress and plan moving forward. Decreasing orthopedic PT visits to once a week until lymphedema starts at which time orthopedic based treatments will be put on hold to focus on lymphedema. Will determine if orthopedic PT will need to resume at the conclusion of his lymphedema treatments. Patient would continue to benefit from skilled physical therapy to improve functional mobility.    Personal Factors and Comorbidities  Comorbidity 2    Comorbidities  Lt TKR, Adenocarcinoma of prostate with lymphectomy of 38 nodes.    Examination-Activity Limitations  Dressing    Stability/Clinical Decision Making  Stable/Uncomplicated    Rehab Potential  Good    PT Frequency  3x / week    PT Duration  3 weeks    PT Treatment/Interventions  ADLs/Self Care Home Management;Patient/family education;Manual lymph drainage;Compression bandaging    PT Next Visit Plan  PT 1x/week focus on ROM and strength at end range of motion until lymph treatment starts then patient will be 3x/week for lymph    PT Home Exercise Plan  Pt completing exercises for recent TKR       Patient will benefit from skilled therapeutic intervention in order to improve the following deficits and impairments:  Decreased skin integrity, Increased edema, Decreased range of motion  Visit Diagnosis: Chronic pain of left knee  Stiffness of left knee, not elsewhere classified  Other symptoms and signs involving the musculoskeletal system     Problem List Patient Active Problem List   Diagnosis Date Noted  . Status post total left knee replacement 03/21/2019  . S/P carpal tunnel release right 02/22/19 03/08/2019  . Carpal tunnel syndrome of right wrist   . HTN (hypertension) 10/27/2018  . Prostate cancer ([place]) 05/30/2017  . Malignant neoplasm of prostate ([place]) 04/28/2017  . Chronic pain of both knees 02/21/2017  . Unilateral primary osteoarthritis, left knee 02/21/2017  . Unilateral primary  osteoarthritis, right knee 02/21/2017   5:31 PM, 05/03/19 Jerene Pitch, DPT Physical Therapy with [facility]  [phone] office  [place] [facility] [address] [place], [place], [address] Phone: [phone]   Fax:  [phone]  Name: [name] MRN: [id number] Date of Birth: 12-09-1955

## 2019-05-04 ENCOUNTER — Ambulatory Visit (HOSPITAL_COMMUNITY): Payer: 59

## 2019-05-06 ENCOUNTER — Other Ambulatory Visit: Payer: Self-pay | Admitting: Cardiology

## 2019-05-06 DIAGNOSIS — I10 Essential (primary) hypertension: Secondary | ICD-10-CM

## 2019-05-07 ENCOUNTER — Encounter (HOSPITAL_COMMUNITY): Payer: 59 | Admitting: Physical Therapy

## 2019-05-07 ENCOUNTER — Ambulatory Visit (HOSPITAL_COMMUNITY): Payer: 59 | Admitting: Physical Therapy

## 2019-05-08 ENCOUNTER — Ambulatory Visit (HOSPITAL_COMMUNITY): Payer: 59 | Admitting: Physical Therapy

## 2019-05-08 ENCOUNTER — Encounter (HOSPITAL_COMMUNITY): Payer: Self-pay

## 2019-05-09 ENCOUNTER — Encounter (HOSPITAL_COMMUNITY): Payer: Self-pay | Admitting: Physical Therapy

## 2019-05-10 ENCOUNTER — Encounter (HOSPITAL_COMMUNITY): Payer: Self-pay

## 2019-05-10 ENCOUNTER — Other Ambulatory Visit: Payer: Self-pay

## 2019-05-10 ENCOUNTER — Ambulatory Visit: Payer: 59 | Admitting: Orthopaedic Surgery

## 2019-05-10 ENCOUNTER — Ambulatory Visit (HOSPITAL_COMMUNITY): Payer: 59 | Admitting: Physical Therapy

## 2019-05-10 ENCOUNTER — Encounter (HOSPITAL_COMMUNITY): Payer: Self-pay | Admitting: Physical Therapy

## 2019-05-10 MED ORDER — DOXYCYCLINE MONOHYDRATE 100 MG PO TABS: 100.0000 mg | ORAL_TABLET | Freq: Two times a day (BID) | ORAL | 0 refills | Status: DC

## 2019-05-10 NOTE — Progress Notes (Signed)
Doxy submitted per Dr Ninfa Linden

## 2019-05-14 ENCOUNTER — Ambulatory Visit: Payer: 59 | Admitting: Orthopaedic Surgery

## 2019-05-15 ENCOUNTER — Encounter (HOSPITAL_COMMUNITY): Payer: Self-pay

## 2019-05-15 ENCOUNTER — Ambulatory Visit (HOSPITAL_COMMUNITY): Payer: 59 | Admitting: Physical Therapy

## 2019-05-15 ENCOUNTER — Encounter (INDEPENDENT_AMBULATORY_CARE_PROVIDER_SITE_OTHER): Payer: Self-pay

## 2019-05-15 ENCOUNTER — Other Ambulatory Visit: Payer: Self-pay

## 2019-05-15 ENCOUNTER — Encounter (HOSPITAL_COMMUNITY): Payer: 59 | Admitting: Physical Therapy

## 2019-05-15 DIAGNOSIS — M25562 Pain in left knee: Secondary | ICD-10-CM

## 2019-05-15 DIAGNOSIS — M25662 Stiffness of left knee, not elsewhere classified: Secondary | ICD-10-CM

## 2019-05-15 DIAGNOSIS — G8929 Other chronic pain: Secondary | ICD-10-CM

## 2019-05-15 DIAGNOSIS — R29898 Other symptoms and signs involving the musculoskeletal system: Secondary | ICD-10-CM | POA: Diagnosis not present

## 2019-05-15 DIAGNOSIS — I890 Lymphedema, not elsewhere classified: Secondary | ICD-10-CM

## 2019-05-15 NOTE — Therapy (Signed)
[facility] [place], [place], [address] Phone: [phone]   Fax:  [phone]  Physical Therapy Treatment  Patient Details  Name: [name] MRN: [id number] Date of Birth: [birth year] Referring Provider (PT): Jean Rosenthal, MD   Encounter Date: 05/15/2019  PT End of Session - 05/15/19 1401    Visit Number  2    Number of Visits  9    Date for PT Re-Evaluation  05/22/19    Authorization Type  [employer]    Authorization Time Period  04/10/19 to 05/22/19    Authorization - Visit Number  2   ADDED LYMPH VISIT TO TOTAL COUNT   Authorization - Number of Visits  9    PT Start Time  [id number]    PT Stop Time  [id number]    PT Time Calculation (min)  80 min    Activity Tolerance  Patient tolerated treatment well;No increased pain    Behavior During Therapy  WFL for tasks assessed/performed       Past Medical History:  Diagnosis Date  . Arthritis   . Diabetes mellitus without complication ([place]) [id number]   Diabetes Type II  . HTN (hypertension) 10/27/2018  . Hyperlipidemia   . Internal hemorrhoids 2014   noted on colonoscopy  . Osteoarthritis of both knees   . Pre-diabetes    questionable  . Prostate cancer [facility])    prostate  . Sigmoid diverticulosis 2014   Mild, noted on colonoscopy    Past Surgical History:  Procedure Laterality Date  . CARPAL TUNNEL RELEASE Right 02/22/2019   Procedure: CARPAL TUNNEL RELEASE;  Surgeon: Carole Civil, MD;  Location: [facility];  Service: Orthopedics;  Laterality: Right;  . COLONOSCOPY N/A 09/13/2012   Procedure: COLONOSCOPY;  Surgeon: Danie Binder, MD;  [facility];  Service: Endoscopy;  Laterality: N/A;  9:30 AM  . HERNIA REPAIR     20 years ago  . KNEE ARTHROSCOPY Left    15 years ago  . LYMPHADENECTOMY Bilateral 05/30/2017   Procedure: LYMPHADENECTOMY, PELVIC EXTENDED;  Surgeon: [name], MD;  [facility];  Service: Urology;  Laterality: Bilateral;  .  ROBOT ASSISTED LAPAROSCOPIC RADICAL PROSTATECTOMY N/A 05/30/2017   Procedure: XI ROBOTIC ASSISTED LAPAROSCOPIC RADICAL PROSTATECTOMY LEVEL 3;  Surgeon: [name], MD;  [facility];  Service: Urology;  Laterality: N/A;  . SHOULDER ARTHROSCOPY Right    15 years ago  . TOTAL KNEE ARTHROPLASTY Left 03/21/2019   Procedure: LEFT TOTAL KNEE ARTHROPLASTY;  Surgeon: Mcarthur Rossetti, MD;  [facility];  Service: Orthopedics;  Laterality: Left;    There were no vitals filed for this visit.                    [facility] Adult PT Treatment/Exercise - 05/15/19 0001      Manual Therapy   Manual Therapy  Manual Lymphatic Drainage (MLD);Compression Bandaging    Manual therapy comments  Manual complete separate than rest of tx    Manual Lymphatic Drainage (MLD)  including short neck, deep and superficial abdominal and lumbar paraspinals.  inguinal-axilary anastomosis bilaterally.  More time spent on Lt LE.     Compression Bandaging  for full Lt LE with 1/2" foam and multilayer short stretch bandaging.              PT Education - 05/15/19 1511    Education Details  purpose of lymph massage/bandaging.  Sequence of treatment, wear time  and are of bandaging    Person(s) Educated  Patient    Methods  Explanation;Demonstration;Tactile cues;Verbal cues    Comprehension  Verbalized understanding       PT Short Term Goals - 05/03/19 1645      PT SHORT TERM GOAL #1   Title  Pt will perform HEP at least 2x/week to improve knee strength and AROM needed for functional activity.    Time  4    Period  Weeks    Status  Achieved    Target Date  05/08/19      PT SHORT TERM GOAL #2   Title  Pt will demonstrate L knee AROM 0-90 degrees to improve gait mechanics and ability to navigate community.    Baseline  2-104    Time  4    Period  Weeks    Status  Achieved        PT Long Term Goals - 05/03/19 1645      PT LONG TERM GOAL #1   Title  Pt will demonstrate L knee  AROM 0-120 degrees to improve gait mechanics and ability to navigate community.    Time  6    Period  Weeks    Status  On-going      PT LONG TERM GOAL #2   Title  Pt will self report 5/10 pain at worst with knee flexion to improve ability to perform functional activities.    Baseline  8/10 with end range pressure    Time  6    Period  Weeks    Status  On-going      PT LONG TERM GOAL #3   Title  Pt will improve FOTO score to 10% limited to represent significant improvement in ability to complete household chores with less pain.    Baseline  see objective section    Time  6    Period  Weeks    Status  On-going      PT LONG TERM GOAL #4   Title  Pt will tolerate lymphedema evaluation, if appropriate.    Time  6    Period  Weeks    Status  On-going            Plan - 05/15/19 1512    Clinical Impression Statement  Initiated manual lymph drainage for bilateral LE with emphasis on Lt LE.  Explained pattern and purpose of massage technique, wear and care of bandaging placed after.  Foam cut for full Lt LE and moisturized well.  instucted to remove bandaging night before next appt, shower and re-roll all bandaging to return for next treatment.  Pt verbalized understanding.    Personal Factors and Comorbidities  Comorbidity 2    Comorbidities  Lt TKR, Adenocarcinoma of prostate with lymphectomy of 38 nodes.    Examination-Activity Limitations  Dressing    Stability/Clinical Decision Making  Stable/Uncomplicated    Rehab Potential  Good    PT Frequency  3x / week    PT Duration  3 weeks    PT Treatment/Interventions  ADLs/Self Care Home Management;Patient/family education;Manual lymph drainage;Compression bandaging    PT Next Visit Plan  Continue with complete lymphatic therapy 3x/week for 2-3 weeks.  Measure on Wednesdays.    PT Home Exercise Plan  Pt completing exercises for recent TKR       Patient will benefit from skilled therapeutic intervention in order to improve the  following deficits and impairments:  Decreased skin integrity, Increased edema, Decreased range  of motion  Visit Diagnosis: Stiffness of left knee, not elsewhere classified  Other symptoms and signs involving the musculoskeletal system  Lymphedema, not elsewhere classified  Chronic pain of left knee     Problem List Patient Active Problem List   Diagnosis Date Noted  . Status post total left knee replacement 03/21/2019  . S/P carpal tunnel release right 02/22/19 03/08/2019  . Carpal tunnel syndrome of right wrist   . HTN (hypertension) 10/27/2018  . Prostate cancer ([place]) 05/30/2017  . Malignant neoplasm of prostate ([place]) 04/28/2017  . Chronic pain of both knees 02/21/2017  . Unilateral primary osteoarthritis, left knee 02/21/2017  . Unilateral primary osteoarthritis, right knee 02/21/2017   Teena Irani, PTA/CLT [phone]  Teena Irani 05/15/2019, 3:16 PM  [facility] [address] [place], [place], [address] Phone: [phone]   Fax:  [phone]  Name: [name] MRN: [id number] Date of Birth: 1955/12/25

## 2019-05-17 ENCOUNTER — Ambulatory Visit (HOSPITAL_COMMUNITY): Payer: 59

## 2019-05-17 ENCOUNTER — Other Ambulatory Visit: Payer: Self-pay

## 2019-05-17 ENCOUNTER — Encounter (HOSPITAL_COMMUNITY): Payer: Self-pay

## 2019-05-17 ENCOUNTER — Ambulatory Visit (HOSPITAL_COMMUNITY): Payer: 59 | Admitting: Physical Therapy

## 2019-05-17 DIAGNOSIS — I890 Lymphedema, not elsewhere classified: Secondary | ICD-10-CM

## 2019-05-17 DIAGNOSIS — R29898 Other symptoms and signs involving the musculoskeletal system: Secondary | ICD-10-CM | POA: Diagnosis not present

## 2019-05-17 NOTE — Therapy (Signed)
[facility] [place], [place], [address] Phone: [phone]   Fax:  [phone]  Physical Therapy Treatment  Patient Details  Name: ADRION STICKROD MRN: [id number] Date of Birth: 10-30-1955 Referring Provider (PT): Jean Rosenthal, MD   Encounter Date: 05/17/2019  PT End of Session - 05/17/19 1145    Visit Number  3    Number of Visits  9    Date for PT Re-Evaluation  05/22/19    Authorization Type  [employer]    Authorization Time Period  04/10/19 to 05/22/19    Authorization - Visit Number  3    Authorization - Number of Visits  9    PT Start Time  1005    PT Stop Time  1130    PT Time Calculation (min)  85 min    Activity Tolerance  Patient tolerated treatment well;No increased pain    Behavior During Therapy  WFL for tasks assessed/performed       Past Medical History:  Diagnosis Date  . Arthritis   . Diabetes mellitus without complication ([place]) XX123456   Diabetes Type II  . HTN (hypertension) 10/27/2018  . Hyperlipidemia   . Internal hemorrhoids 2014   noted on colonoscopy  . Osteoarthritis of both knees   . Pre-diabetes    questionable  . Prostate cancer [facility])    prostate  . Sigmoid diverticulosis 2014   Mild, noted on colonoscopy    Past Surgical History:  Procedure Laterality Date  . CARPAL TUNNEL RELEASE Right 02/22/2019   Procedure: CARPAL TUNNEL RELEASE;  Surgeon: Carole Civil, MD;  Location: [facility];  Service: Orthopedics;  Laterality: Right;  . COLONOSCOPY N/A 09/13/2012   Procedure: COLONOSCOPY;  Surgeon: Danie Binder, MD;  [facility];  Service: Endoscopy;  Laterality: N/A;  9:30 AM  . HERNIA REPAIR     20 years ago  . KNEE ARTHROSCOPY Left    15 years ago  . LYMPHADENECTOMY Bilateral 05/30/2017   Procedure: LYMPHADENECTOMY, PELVIC EXTENDED;  Surgeon: [name], MD;  [facility];  Service: Urology;  Laterality: Bilateral;  . ROBOT ASSISTED LAPAROSCOPIC RADICAL  PROSTATECTOMY N/A 05/30/2017   Procedure: XI ROBOTIC ASSISTED LAPAROSCOPIC RADICAL PROSTATECTOMY LEVEL 3;  Surgeon: [name], MD;  [facility];  Service: Urology;  Laterality: N/A;  . SHOULDER ARTHROSCOPY Right    15 years ago  . TOTAL KNEE ARTHROPLASTY Left 03/21/2019   Procedure: LEFT TOTAL KNEE ARTHROPLASTY;  Surgeon: Mcarthur Rossetti, MD;  [facility];  Service: Orthopedics;  Laterality: Left;    There were no vitals filed for this visit.  Subjective Assessment - 05/17/19 1141    Subjective  Pt stated some irritation on knee incision, reports vast improvement in reduction of edema.  No pain currently    Pertinent History  Carpal tunnel release, LT TKR< lymphadenectomy, prostatectomy, TSR    Patient Stated Goals  "I want you to get this thing to bend without hurting"    Currently in Pain?  No/denies                       [facility] PT Treatment/Exercise - 05/17/19 0001      Manual Therapy   Manual Therapy  Manual Lymphatic Drainage (MLD);Compression Bandaging    Manual therapy comments  Manual complete separate than rest of tx    Manual Lymphatic Drainage (MLD)  including short neck, deep and superficial abdominal and lumbar  paraspinals.  inguinal-axilary anastomosis bilaterally.  More time spent on Lt LE.     Compression Bandaging  for full Lt LE with 1/2" foam and multilayer short stretch bandaging.                PT Short Term Goals - 05/03/19 1645      PT SHORT TERM GOAL #1   Title  Pt will perform HEP at least 2x/week to improve knee strength and AROM needed for functional activity.    Time  4    Period  Weeks    Status  Achieved    Target Date  05/08/19      PT SHORT TERM GOAL #2   Title  Pt will demonstrate L knee AROM 0-90 degrees to improve gait mechanics and ability to navigate community.    Baseline  2-104    Time  4    Period  Weeks    Status  Achieved        PT Long Term Goals - 05/03/19 1645      PT LONG  TERM GOAL #1   Title  Pt will demonstrate L knee AROM 0-120 degrees to improve gait mechanics and ability to navigate community.    Time  6    Period  Weeks    Status  On-going      PT LONG TERM GOAL #2   Title  Pt will self report 5/10 pain at worst with knee flexion to improve ability to perform functional activities.    Baseline  8/10 with end range pressure    Time  6    Period  Weeks    Status  On-going      PT LONG TERM GOAL #3   Title  Pt will improve FOTO score to 10% limited to represent significant improvement in ability to complete household chores with less pain.    Baseline  see objective section    Time  6    Period  Weeks    Status  On-going      PT LONG TERM GOAL #4   Title  Pt will tolerate lymphedema evaluation, if appropriate.    Time  6    Period  Weeks    Status  On-going            Plan - 05/17/19 1146    Clinical Impression Statement  Pt reports irritation to knee incision with netting.  Changed netting material with reports of vast improved tolerance.  Began session with manual lymph decongestions techniques including short neck, superficial and deep abdominal work and increased manual time spent on Lt LE especially on indurated areas proximal medial thigh.  Applied lotion for skin integrity and continues with 1/2" foam and full Lt LE short stretch multilayer bandages.  Reviewed next apt date and time and requested pt to shower and re-roll bandages before next apt.  Pt verbalized understanding.    Personal Factors and Comorbidities  Comorbidity 2    Comorbidities  Lt TKR, Adenocarcinoma of prostate with lymphectomy of 38 nodes.    Examination-Activity Limitations  Dressing    Examination-Participation Restrictions  Church;Yard Work    Stability/Clinical Decision Making  Stable/Uncomplicated    Designer, jewellery  Low    Rehab Potential  Good    PT Frequency  3x / week    PT Duration  3 weeks    PT Treatment/Interventions  ADLs/Self Care Home  Management;Patient/family education;Manual lymph drainage;Compression bandaging    PT Next  Visit Plan  Continue with complete lymphatic therapy 3x/week for 2-3 weeks.  Measure on Wednesdays.    PT Home Exercise Plan  Pt completing exercises for recent TKR       Patient will benefit from skilled therapeutic intervention in order to improve the following deficits and impairments:  Decreased skin integrity, Increased edema, Decreased range of motion  Visit Diagnosis: Lymphedema, not elsewhere classified     Problem List Patient Active Problem List   Diagnosis Date Noted  . Status post total left knee replacement 03/21/2019  . S/P carpal tunnel release right 02/22/19 03/08/2019  . Carpal tunnel syndrome of right wrist   . HTN (hypertension) 10/27/2018  . Prostate cancer ([place]) 05/30/2017  . Malignant neoplasm of prostate ([place]) 04/28/2017  . Chronic pain of both knees 02/21/2017  . Unilateral primary osteoarthritis, left knee 02/21/2017  . Unilateral primary osteoarthritis, right knee 02/21/2017   Ihor Austin, LPTA/CLT; CBIS [phone]  Aldona Lento 05/17/2019, 11:53 AM  [facility] [place], [place], [address] Phone: [phone]   Fax:  [phone]  Name: CHRISTEPHER FRONHEISER MRN: [id number] Date of Birth: 10-02-1955

## 2019-05-18 ENCOUNTER — Encounter (HOSPITAL_COMMUNITY): Payer: Self-pay

## 2019-05-18 ENCOUNTER — Telehealth: Payer: Self-pay | Admitting: Orthopaedic Surgery

## 2019-05-18 ENCOUNTER — Ambulatory Visit (HOSPITAL_COMMUNITY): Payer: 59

## 2019-05-18 DIAGNOSIS — I890 Lymphedema, not elsewhere classified: Secondary | ICD-10-CM

## 2019-05-18 DIAGNOSIS — R29898 Other symptoms and signs involving the musculoskeletal system: Secondary | ICD-10-CM | POA: Diagnosis not present

## 2019-05-18 MED ORDER — OXYCODONE HCL 5 MG PO TABS: 5.0000 mg | ORAL_TABLET | Freq: Four times a day (QID) | ORAL | 0 refills | Status: DC | PRN

## 2019-05-18 NOTE — Telephone Encounter (Signed)
Please advise 

## 2019-05-18 NOTE — Telephone Encounter (Signed)
Patient called. Would like a refill on oxycodone. His call back number is [phone]

## 2019-05-18 NOTE — Therapy (Signed)
[place], [place], [address] Phone: [phone]   Fax:  [phone]  Physical Therapy Treatment  Patient Details  Name: [name] MRN: [id number] Date of Birth: Mar 04, 1956 Referring Provider (PT): Jean Rosenthal, MD   Encounter Date: 05/18/2019  PT End of Session - 05/18/19 1131    Visit Number  4    Number of Visits  9    Date for PT Re-Evaluation  05/22/19    Authorization Type  [employer]    Authorization Time Period  04/10/19 to 05/22/19    Authorization - Visit Number  4    Authorization - Number of Visits  9    PT Start Time  1005    PT Stop Time  1125    PT Time Calculation (min)  80 min    Activity Tolerance  Patient tolerated treatment well;No increased pain    Behavior During Therapy  WFL for tasks assessed/performed       Past Medical History:  Diagnosis Date  . Arthritis   . Diabetes mellitus without complication ([place]) [id number]   Diabetes Type II  . HTN (hypertension) 10/27/2018  . Hyperlipidemia   . Internal hemorrhoids 2014   noted on colonoscopy  . Osteoarthritis of both knees   . Pre-diabetes    questionable  . Prostate cancer [facility])    prostate  . Sigmoid diverticulosis 2014   Mild, noted on colonoscopy    Past Surgical History:  Procedure Laterality Date  . CARPAL TUNNEL RELEASE Right 02/22/2019   Procedure: CARPAL TUNNEL RELEASE;  Surgeon: Carole Civil, MD;  Location: [facility];  Service: Orthopedics;  Laterality: Right;  . COLONOSCOPY N/A 09/13/2012   Procedure: COLONOSCOPY;  Surgeon: Danie Binder, MD;  Location: [facility];  Service: Endoscopy;  Laterality: N/A;  9:30 AM  . HERNIA REPAIR     20 years ago  . KNEE ARTHROSCOPY Left    15 years ago  . LYMPHADENECTOMY Bilateral 05/30/2017   Procedure: LYMPHADENECTOMY, PELVIC EXTENDED;  Surgeon: [name], MD;  Location: [facility];  Service: Urology;  Laterality: Bilateral;  . ROBOT ASSISTED LAPAROSCOPIC RADICAL  PROSTATECTOMY N/A 05/30/2017   Procedure: XI ROBOTIC ASSISTED LAPAROSCOPIC RADICAL PROSTATECTOMY LEVEL 3;  Surgeon: [name], MD;  Location: [facility];  Service: Urology;  Laterality: N/A;  . SHOULDER ARTHROSCOPY Right    15 years ago  . TOTAL KNEE ARTHROPLASTY Left 03/21/2019   Procedure: LEFT TOTAL KNEE ARTHROPLASTY;  Surgeon: Mcarthur Rossetti, MD;  Location: [facility];  Service: Orthopedics;  Laterality: Left;    There were no vitals filed for this visit.  Subjective Assessment - 05/18/19 1803    Subjective  Pt reports vast improvements with no irritatin on knee incision with changed dressings.  Stated it was impressive reduction in swelling this morning.  Stated his knee feels more limber while doing his exercises at home    Pertinent History  Carpal tunnel release, LT TKR< lymphadenectomy, prostatectomy, TSR    Patient Stated Goals  "I want you to get this thing to bend without hurting"    Currently in Pain?  No/denies                       [facility] Adult PT Treatment/Exercise - 05/18/19 0001      Manual Therapy   Manual Therapy  Manual Lymphatic Drainage (MLD);Compression Bandaging    Manual therapy comments  Manual complete separate than rest of  tx    Manual Lymphatic Drainage (MLD)  including short neck, deep and superficial abdominal and lumbar paraspinals.  inguinal-axilary anastomosis bilaterally.  More time spent on Lt LE.     Compression Bandaging  for full Lt LE with 1/2" foam and multilayer short stretch bandaging.                PT Short Term Goals - 05/03/19 1645      PT SHORT TERM GOAL #1   Title  Pt will perform HEP at least 2x/week to improve knee strength and AROM needed for functional activity.    Time  4    Period  Weeks    Status  Achieved    Target Date  05/08/19      PT SHORT TERM GOAL #2   Title  Pt will demonstrate L knee AROM 0-90 degrees to improve gait mechanics and ability to navigate community.    Baseline  2-104     Time  4    Period  Weeks    Status  Achieved        PT Long Term Goals - 05/03/19 1645      PT LONG TERM GOAL #1   Title  Pt will demonstrate L knee AROM 0-120 degrees to improve gait mechanics and ability to navigate community.    Time  6    Period  Weeks    Status  On-going      PT LONG TERM GOAL #2   Title  Pt will self report 5/10 pain at worst with knee flexion to improve ability to perform functional activities.    Baseline  8/10 with end range pressure    Time  6    Period  Weeks    Status  On-going      PT LONG TERM GOAL #3   Title  Pt will improve FOTO score to 10% limited to represent significant improvement in ability to complete household chores with less pain.    Baseline  see objective section    Time  6    Period  Weeks    Status  On-going      PT LONG TERM GOAL #4   Title  Pt will tolerate lymphedema evaluation, if appropriate.    Time  6    Period  Weeks    Status  On-going            Plan - 05/18/19 1807    Clinical Impression Statement  Good tolerance following change with stocking material.  Continued manual lymph decongestion techniques including short neck, superficial and deep abdominal and BLE with more time spent on proximal Lt LE.  Decreased induration palpated on medial thigh.  Continued wiht 1/2" form and full length Lt LE short stretch multilayer bandages.  Pt will benefit from self care instruction next session.    Personal Factors and Comorbidities  Comorbidity 2    Comorbidities  Lt TKR, Adenocarcinoma of prostate with lymphectomy of 38 nodes.    Examination-Activity Limitations  Dressing    Examination-Participation Restrictions  Church;Yard Work    Stability/Clinical Decision Making  Stable/Uncomplicated    Rehab Potential  Good    PT Frequency  3x / week    PT Duration  3 weeks    PT Treatment/Interventions  ADLs/Self Care Home Management;Patient/family education;Manual lymph drainage;Compression bandaging    PT Next Visit Plan   Instruct self manual techniques next session.  Continue with complete lymphatic therapy 3x/week for 2-3 weeks.  Measure on Wednesdays.    PT Home Exercise Plan  Pt completing exercises for recent TKR       Patient will benefit from skilled therapeutic intervention in order to improve the following deficits and impairments:  Decreased skin integrity, Increased edema, Decreased range of motion  Visit Diagnosis: Lymphedema, not elsewhere classified     Problem List Patient Active Problem List   Diagnosis Date Noted  . Status post total left knee replacement 03/21/2019  . S/P carpal tunnel release right 02/22/19 03/08/2019  . Carpal tunnel syndrome of right wrist   . HTN (hypertension) 10/27/2018  . Prostate cancer ([place]) 05/30/2017  . Malignant neoplasm of prostate ([place]) 04/28/2017  . Chronic pain of both knees 02/21/2017  . Unilateral primary osteoarthritis, left knee 02/21/2017  . Unilateral primary osteoarthritis, right knee 02/21/2017   Ihor Austin, LPTA/CLT; CBIS [phone]  [place] 05/18/2019, [place], [place], [address] Phone: [phone]   Fax:  [phone]  Name: [name] MRN: [id number] Date of Birth: Oct 13, 1955

## 2019-05-21 ENCOUNTER — Encounter (HOSPITAL_COMMUNITY): Payer: Self-pay | Admitting: Physical Therapy

## 2019-05-21 ENCOUNTER — Ambulatory Visit (HOSPITAL_COMMUNITY): Payer: 59 | Attending: Orthopaedic Surgery | Admitting: Physical Therapy

## 2019-05-21 ENCOUNTER — Other Ambulatory Visit: Payer: Self-pay

## 2019-05-21 DIAGNOSIS — I890 Lymphedema, not elsewhere classified: Secondary | ICD-10-CM | POA: Insufficient documentation

## 2019-05-21 DIAGNOSIS — M25662 Stiffness of left knee, not elsewhere classified: Secondary | ICD-10-CM

## 2019-05-21 DIAGNOSIS — R29898 Other symptoms and signs involving the musculoskeletal system: Secondary | ICD-10-CM | POA: Diagnosis present

## 2019-05-21 NOTE — Therapy (Signed)
[place], [place], [address] Phone: [phone]   Fax:  [phone]  Physical Therapy Treatment  Patient Details  Name: [name] MRN: [id number] Date of Birth: 03/09/1956 Referring Provider (PT): Jean Rosenthal, MD  PHYSICAL THERAPY DISCHARGE SUMMARY  Visits from Start of Care: 5 Current functional level related to goals / functional outcomes: See below    Remaining deficits: ROM 2-113 ; some edema remains but pt is pleased with progress and does not want to continue therapy.    Education / Equipment: Compression garment, compression pump.   Plan: Patient agrees to discharge.  Patient goals were not met. Patient is being discharged due to meeting the stated rehab goals.  ?????       Encounter Date: 05/21/2019  PT End of Session - 05/21/19 1015    Visit Number  5    Number of Visits  5    Date for PT Re-Evaluation  05/22/19    Authorization Type  [employer]    Authorization Time Period  04/10/19 to 05/22/19    Authorization - Visit Number  5    Authorization - Number of Visits  5    PT Start Time  0830    PT Stop Time  0940    PT Time Calculation (min)  70 min    Activity Tolerance  Patient tolerated treatment well;No increased pain    Behavior During Therapy  WFL for tasks assessed/performed       Past Medical History:  Diagnosis Date  . Arthritis   . Diabetes mellitus without complication ([place]) 3335   Diabetes Type II  . HTN (hypertension) 10/27/2018  . Hyperlipidemia   . Internal hemorrhoids 2014   noted on colonoscopy  . Osteoarthritis of both knees   . Pre-diabetes    questionable  . Prostate cancer [facility])    prostate  . Sigmoid diverticulosis 2014   Mild, noted on colonoscopy    Past Surgical History:  Procedure Laterality Date  . CARPAL TUNNEL RELEASE Right 02/22/2019   Procedure: CARPAL TUNNEL RELEASE;  Surgeon: Carole Civil, MD;  Location: [facility];  Service:  Orthopedics;  Laterality: Right;  . COLONOSCOPY N/A 09/13/2012   Procedure: COLONOSCOPY;  Surgeon: Danie Binder, MD;  [facility];  Service: Endoscopy;  Laterality: N/A;  9:30 AM  . HERNIA REPAIR     20 years ago  . KNEE ARTHROSCOPY Left    15 years ago  . LYMPHADENECTOMY Bilateral 05/30/2017   Procedure: LYMPHADENECTOMY, PELVIC EXTENDED;  Surgeon: [name], MD;  Location[facility];  Service: Urology;  Laterality: Bilateral;  . ROBOT ASSISTED LAPAROSCOPIC RADICAL PROSTATECTOMY N/A 05/30/2017   Procedure: XI ROBOTIC ASSISTED LAPAROSCOPIC RADICAL PROSTATECTOMY LEVEL 3;  Surgeon: [name], MD;  Location[facility];  Service: Urology;  Laterality: N/A;  . SHOULDER ARTHROSCOPY Right    15 years ago  . TOTAL KNEE ARTHROPLASTY Left 03/21/2019   Procedure: LEFT TOTAL KNEE ARTHROPLASTY;  Surgeon: Mcarthur Rossetti, MD;  Location: [facility];  Service: Orthopedics;  Laterality: Left;    There were no vitals filed for this visit.  Subjective Assessment - 05/21/19 0951    Subjective  Pt states that he has been taking off his bandages and then together his wife and him rebandage, states it has been working out pretty good.   Pt recieved his pump on Friday he has only used it once but it was last night after he had  been on his feet all day and had not had his compression garment on.  He states that his leg was pretty swollen and the pump did a very good job.  Pt feels with the pump he can be discharged.    Pertinent History  Carpal tunnel release, LT TKR< lymphadenectomy, prostatectomy, TSR    Patient Stated Goals  "I want you to get this thing to bend without hurting"    Currently in Pain?  Yes    Pain Score  4     Pain Location  Knee    Pain Orientation  Left;Medial    Pain Descriptors / Indicators  Sharp    Pain Type  Acute pain    Pain Onset  More than a month ago    Pain Frequency  Intermittent    Aggravating Factors   with end range flexion    Pain Relieving Factors  ice     Effect of Pain on Daily Activities  limits         [facility] PT Assessment - 05/21/19 0001      Assessment   Medical Diagnosis  L TKA    Referring Provider (PT)  Jean Rosenthal, MD    Onset Date/Surgical Date  03/21/19    Next MD Visit  February 2021    Prior Therapy  Yes, acute care and HHPT after surgery      Observation/Other Assessments   Skin Integrity  skin integrity has improved       AROM   Left Knee Extension  2    Left Knee Flexion  113   was 105 prior to lymphedema treatment ; 110 on 2/11. Pain     Strength   Right Knee Flexion  5/5    Right Knee Extension  5/5    Left Knee Flexion  5/5    Left Knee Extension  5/5    Right Ankle Dorsiflexion  5/5    Left Ankle Dorsiflexion  5/5        LYMPHEDEMA/ONCOLOGY QUESTIONNAIRE - 05/21/19 0958      Surgeries   Lumpectomy Date  05/22/17    Number Lymph Nodes Removed  23      Treatment   Past Radiation Treatment  Yes    Body Site  pelvic 38      What other symptoms do you have   Are you Having Heaviness or Tightness  Yes    Are you having Pain  No    Are you having pitting edema  Yes    Body Site  LE    Is it Hard or Difficult finding clothes that fit  Yes      Lymphedema Stage   Stage  STAGE 2 SPONTANEOUSLY IRREVERSIBLE      Lymphedema Assessments   Lymphedema Assessments  Lower extremities      Right Lower Extremity Lymphedema   20 cm Proximal to Suprapatella  55 cm   was 55.8   10 cm Proximal to Suprapatella  47 cm   was 48.7   At Midpatella/Popliteal Crease  38 cm   was 38.7   30 cm Proximal to Floor at Lateral Plantar Foot  37 cm   was 39.3 eval had typo of 29.3   20 cm Proximal to Floor at Lateral Plantar Foot  26.'5 1    10 '$ cm Proximal to Floor at Lateral Malleoli  23.5 cm    Circumference of ankle/heel  34 cm.   34.7   5 cm Proximal  to 1st MTP Joint  23 cm   24   Across MTP Joint  24 cm   24.5     Left Lower Extremity Lymphedema   20 cm Proximal to Suprapatella  54 cm   was 56.5    10 cm Proximal to Suprapatella  49 cm   was 52.3   At Midpatella/Popliteal Crease  43.7 cm   was 44.2   30 cm Proximal to Floor at Lateral Plantar Foot  36.2 cm   38.2   20 cm Proximal to Floor at Lateral Plantar Foot  27 cm   was 28.2   10 cm Proximal to Floor at Lateral Malleoli  25.3 cm   27.3   Circumference of ankle/heel  34 cm.   35.5   5 cm Proximal to 1st MTP Joint  22.8 cm    Across MTP Joint  23 cm   23.8               [facility] Adult PT Treatment/Exercise - 05/21/19 0001      Knee/Hip Exercises: Aerobic   Recumbent Bike  5'      Knee/Hip Exercises: Supine   Quad Sets  Left;5 reps    Heel Slides  Left;10 reps      Knee/Hip Exercises: Prone   Hamstring Curl  5 reps    Contract/Relax to Increase Flexion  3x      Manual Therapy   Manual Therapy  Manual Lymphatic Drainage (MLD)    Manual therapy comments  Manual complete separate than rest of tx             PT Education - 05/21/19 1011    Education Details  Therapist stressed not to self bandage as pt has not been taught and bandaging incorrectly can cause more damage than good.  Pt instructed that he needed to wear compression garment thigh high everyday.  Pt instructed that if he has a day that he notes that his swelling is more than it has been he may pump twice a day.    Person(s) Educated  Patient    Methods  Explanation    Comprehension  Verbalized understanding       PT Short Term Goals - 05/21/19 1022      PT SHORT TERM GOAL #1   Title  PT Rt LE to reduce 1-2 cm ; LT to reduce 2 cm    Time  10    Period  Days    Status  Achieved    Target Date  05/18/19      PT SHORT TERM GOAL #2   Title  PT to be cleansing and moisturizing LE on a daily basis to improve skin integrity    Time  10    Period  Days    Status  Achieved      PT SHORT TERM GOAL #3   Title  PT to be able to verbalize that he understands that Lymphedema is a chronic condition and he will need to manage it or his edema  will return.    Time  10    Period  Days    Status  Partially Met        PT Long Term Goals - 05/21/19 1022      PT LONG TERM GOAL #1   Title  Pt Volume in his Lt LE to be decreased by 3 cm    Time  3    Period  Weeks  Status  Partially Met      PT LONG TERM GOAL #2   Title  PT to have obtained and be using a compression pump on a daily basis    Time  3    Period  Weeks    Status  Achieved      PT LONG TERM GOAL #3   Title  Pt to be able to bend his Lt knee to 115 due to reduced edema to be able to squat to pick items off of the floor.    Time  3    Period  Weeks    Status  Not Met   113           Plan - 05/21/19 1016    Clinical Impression Statement  Pt informs therapist that he has recieved and used his pump.  He is pleased with the pump.  Pt also states that he has not been tolerating the bandaging, he has been taking it off whenever he wears pants and then , despite the fact that he has not been trained, he and his wife have been rewrapping him.  Most likely pt would have decongested even more if he would have tolerated the compression bandaging. Pt desires to be done with treatment as he feels the pump cane take the place of the manual and he can not tolerate to bandaging.  PT advised that this is a chronic progressive condition and that he should be wearing his compression garments everyday, garments should be replaced every six months, he should be pumping once a day.  Pt verbalized understanding. Therapist instructed pt to place bandages somewhere he will remember in case he ever has an exacerbation and needs a few sessions to get his lymphedema under control.    Personal Factors and Comorbidities  Comorbidity 2    Comorbidities  Lt TKR, Adenocarcinoma of prostate with lymphectomy of 38 nodes.    Examination-Activity Limitations  Dressing    Examination-Participation Restrictions  Church;Yard Work    Stability/Clinical Decision Making  Stable/Uncomplicated     Rehab Potential  Good    PT Frequency  3x / week    PT Duration  3 weeks    PT Treatment/Interventions  ADLs/Self Care Home Management;Patient/family education;Manual lymph drainage;Compression bandaging    PT Next Visit Plan  Pt will be discharged.    PT Home Exercise Plan  Pt completing exercises for recent TKR       Patient will benefit from skilled therapeutic intervention in order to improve the following deficits and impairments:  Decreased skin integrity, Increased edema, Decreased range of motion  Visit Diagnosis: Lymphedema, not elsewhere classified  Stiffness of left knee, not elsewhere classified  Other symptoms and signs involving the musculoskeletal system     Problem List Patient Active Problem List   Diagnosis Date Noted  . Status post total left knee replacement 03/21/2019  . S/P carpal tunnel release right 02/22/19 03/08/2019  . Carpal tunnel syndrome of right wrist   . HTN (hypertension) 10/27/2018  . Prostate cancer ([place]) 05/30/2017  . Malignant neoplasm of prostate ([place]) 04/28/2017  . Chronic pain of both knees 02/21/2017  . Unilateral primary osteoarthritis, left knee 02/21/2017  . Unilateral primary osteoarthritis, right knee 02/21/2017    Rayetta Humphrey, PT CLT [phone] 05/21/2019, 10:24 AM  [place] [address] [place], [place], [address] Phone: [phone]   Fax:  [phone]  Name: [name] MRN: [id number] Date of Birth:  09/18/1955   

## 2019-05-23 ENCOUNTER — Ambulatory Visit (HOSPITAL_COMMUNITY): Payer: 59 | Admitting: Physical Therapy

## 2019-05-23 ENCOUNTER — Encounter (HOSPITAL_COMMUNITY): Payer: 59

## 2019-05-24 ENCOUNTER — Encounter (HOSPITAL_COMMUNITY): Payer: 59

## 2019-05-24 ENCOUNTER — Encounter (HOSPITAL_COMMUNITY): Payer: 59 | Admitting: Physical Therapy

## 2019-05-25 ENCOUNTER — Ambulatory Visit (HOSPITAL_COMMUNITY): Payer: 59 | Admitting: Physical Therapy

## 2019-05-29 ENCOUNTER — Encounter (HOSPITAL_COMMUNITY): Payer: Self-pay | Admitting: Physical Therapy

## 2019-05-30 ENCOUNTER — Encounter (HOSPITAL_COMMUNITY): Payer: Self-pay | Admitting: Physical Therapy

## 2019-05-31 ENCOUNTER — Encounter: Payer: Self-pay | Admitting: Orthopaedic Surgery

## 2019-05-31 ENCOUNTER — Ambulatory Visit (INDEPENDENT_AMBULATORY_CARE_PROVIDER_SITE_OTHER): Payer: 59 | Admitting: Orthopaedic Surgery

## 2019-05-31 ENCOUNTER — Other Ambulatory Visit: Payer: Self-pay

## 2019-05-31 DIAGNOSIS — Z9889 Other specified postprocedural states: Secondary | ICD-10-CM

## 2019-05-31 DIAGNOSIS — Z96652 Presence of left artificial knee joint: Secondary | ICD-10-CM

## 2019-05-31 MED ORDER — DICLOFENAC SODIUM 75 MG PO TBEC: 75.0000 mg | DELAYED_RELEASE_TABLET | Freq: Two times a day (BID) | ORAL | 3 refills | Status: DC | PRN

## 2019-05-31 NOTE — Progress Notes (Signed)
The patient is now 10 weeks status post a left total knee arthroplasty.  He says the knee is doing very well for him.  He does still report some edema in his leg and swelling but overall he is making progress.  On exam his extension is almost full of the left knee.  His flexion is to about 110 degrees today.  The knee feels ligamentously stable.  He will continue to push himself with his home exercise program.  The swelling should decrease in time with his left knee.  All question concerns were answered and addressed.  We will see him back at 6 months with a standing AP and lateral of the left knee.

## 2019-06-29 ENCOUNTER — Encounter (HOSPITAL_COMMUNITY): Payer: Self-pay

## 2019-07-13 ENCOUNTER — Ambulatory Visit
Admission: EM | Admit: 2019-07-13 | Discharge: 2019-07-13 | Disposition: A | Discharge status: Home / Self Care | Payer: 59 | Attending: Emergency Medicine | Admitting: Emergency Medicine

## 2019-07-13 ENCOUNTER — Other Ambulatory Visit: Payer: Self-pay

## 2019-07-13 DIAGNOSIS — Z20822 Contact with and (suspected) exposure to covid-19: Secondary | ICD-10-CM

## 2019-07-13 NOTE — Discharge Instructions (Addendum)

## 2019-07-13 NOTE — ED Triage Notes (Signed)
Pt presents with mild allergy symptoms and cough. pts wife tested positive on Monday

## 2019-07-13 NOTE — ED Provider Notes (Signed)
[facility]    CSN: [place]:[id number] Arrival date & time: 07/13/19  [id number]      History   Chief Complaint Chief Complaint  Patient presents with  . Cough    HPI [name] is a 64 y.o. male.   Who presented to the urgent care with a complaint of cough for the past few days.  Reported wife tested positive for COVID-19.  Denies sick exposure to  flu or strep.  Denies recent travel.  Denies aggravating or alleviating symptoms.  Denies previous COVID infection.   Denies fever, chills, fatigue, nasal congestion, rhinorrhea, sore throat,  SOB, wheezing, chest pain, nausea, vomiting, changes in bowel or bladder habits.    The history is provided by the patient. No language interpreter was used.    Past Medical History:  Diagnosis Date  . Arthritis   . Diabetes mellitus without complication ([place]) [id number]   Diabetes Type II  . HTN (hypertension) 10/27/2018  . Hyperlipidemia   . Internal hemorrhoids 2014   noted on colonoscopy  . Osteoarthritis of both knees   . Pre-diabetes    questionable  . Prostate cancer [facility])    prostate  . Sigmoid diverticulosis 2014   Mild, noted on colonoscopy    Patient Active Problem List   Diagnosis Date Noted  . Status post total left knee replacement 03/21/2019  . S/P carpal tunnel release right 02/22/19 03/08/2019  . Carpal tunnel syndrome of right wrist   . HTN (hypertension) 10/27/2018  . Prostate cancer ([place]) 05/30/2017  . Malignant neoplasm of prostate ([place]) 04/28/2017  . Chronic pain of both knees 02/21/2017  . Unilateral primary osteoarthritis, left knee 02/21/2017  . Unilateral primary osteoarthritis, right knee 02/21/2017    Past Surgical History:  Procedure Laterality Date  . CARPAL TUNNEL RELEASE Right 02/22/2019   Procedure: CARPAL TUNNEL RELEASE;  Surgeon: Carole Civil, MD;  Location: [facility];  Service: Orthopedics;  Laterality: Right;  . COLONOSCOPY N/A 09/13/2012   Procedure: COLONOSCOPY;  Surgeon: Danie Binder,  MD;  Location: [facility];  Service: Endoscopy;  Laterality: N/A;  9:30 AM  . HERNIA REPAIR     20 years ago  . KNEE ARTHROSCOPY Left    15 years ago  . LYMPHADENECTOMY Bilateral 05/30/2017   Procedure: LYMPHADENECTOMY, PELVIC EXTENDED;  Surgeon: [name], MD;  Location: [facility];  Service: Urology;  Laterality: Bilateral;  . ROBOT ASSISTED LAPAROSCOPIC RADICAL PROSTATECTOMY N/A 05/30/2017   Procedure: XI ROBOTIC ASSISTED LAPAROSCOPIC RADICAL PROSTATECTOMY LEVEL 3;  Surgeon: [name], MD;  Location: [facility];  Service: Urology;  Laterality: N/A;  . SHOULDER ARTHROSCOPY Right    15 years ago  . TOTAL KNEE ARTHROPLASTY Left 03/21/2019   Procedure: LEFT TOTAL KNEE ARTHROPLASTY;  Surgeon: Mcarthur Rossetti, MD;  Location: [facility];  Service: Orthopedics;  Laterality: Left;       Home Medications    Prior to Admission medications   Medication Sig Start Date End Date Taking? Authorizing Provider  aspirin EC 325 MG EC tablet Take 1 tablet (325 mg total) by mouth 2 (two) times daily at 10 am and 4 pm. 03/22/19   Mcarthur Rossetti, MD  diclofenac (VOLTAREN) 75 MG EC tablet Take 1 tablet (75 mg total) by mouth 2 (two) times daily between meals as needed. 05/31/19   Mcarthur Rossetti, MD  doxycycline (ADOXA) 100 MG tablet Take 1 tablet (100 mg total) by mouth 2 (two) times daily. 05/10/19   Mcarthur Rossetti,  MD  escitalopram (LEXAPRO) 5 MG tablet Take 5 mg by mouth daily.    [provider]  gabapentin (NEURONTIN) 100 MG capsule TAKE 1 CAPSULE BY MOUTH THREE TIMES A DAY 04/13/19   Mcarthur Rossetti, MD  glimepiride (AMARYL) 1 MG tablet Take 1 mg by mouth daily. 01/22/19   [provider]  icosapent Ethyl (VASCEPA) 1 g capsule Take 2 capsules (2 g total) by mouth 2 (two) times daily. 03/01/19   Miquel Dunn, NP  losartan-hydrochlorothiazide [facility]) 50-12.5 MG tablet TAKE 1 TABLET BY MOUTH EVERY DAY 05/08/19   Adrian Prows, MD  methocarbamol  (ROBAXIN) 500 MG tablet TAKE 1 TABLET (500 MG TOTAL) BY MOUTH EVERY 6 (SIX) HOURS AS NEEDED FOR MUSCLE SPASMS. 04/27/19   Mcarthur Rossetti, MD  Naproxen Sod-diphenhydrAMINE (ALEVE PM) 220-25 MG TABS Take 1 tablet by mouth at bedtime.    [provider]  naproxen sodium (ALEVE) 220 MG tablet Take 220-440 mg by mouth daily as needed (pain).     [provider]  ondansetron (ZOFRAN ODT) 4 MG disintegrating tablet Take 1 tablet (4 mg total) by mouth every 8 (eight) hours as needed for nausea or vomiting. 03/22/19   Mcarthur Rossetti, MD  oxyCODONE (ROXICODONE) 5 MG immediate release tablet Take 1-2 tablets (5-10 mg total) by mouth every 6 (six) hours as needed for severe pain. 05/18/19   Mcarthur Rossetti, MD  rosuvastatin (CRESTOR) 20 MG tablet TAKE 1 TABLET BY MOUTH EVERY DAY Patient taking differently: Take 20 mg by mouth daily.  02/22/19   Adrian Prows, MD  temazepam (RESTORIL) 15 MG capsule Take 15 mg by mouth at bedtime as needed for sleep.    [provider]  TRESIBA FLEXTOUCH 100 UNIT/ML SOPN FlexTouch Pen Inject 30 Units into the skin daily.  03/01/19   [provider]    Family History Family History  Problem Relation Age of Onset  . Cancer Mother        small cell lung    Social History Social History   Tobacco Use  . Smoking status: Never Smoker  . Smokeless tobacco: Never Used  Substance Use Topics  . Alcohol use: No  . Drug use: No     Allergies   Patient has no known allergies.   Review of Systems Review of Systems  Constitutional: Negative.   HENT: Negative.   Respiratory: Positive for cough.   Cardiovascular: Negative.   Gastrointestinal: Negative.   Neurological: Negative.      Physical Exam Triage Vital Signs ED Triage Vitals  Enc Vitals Group     BP      Pulse      Resp      Temp      Temp src      SpO2      Weight      Height      Head Circumference      Peak Flow      Pain Score      Pain  Loc      Pain Edu?      Excl. in [place]?    No data found.  Updated Vital Signs BP 125/80   Pulse 68   Temp 98 F (36.7 C)   Resp 18   SpO2 95%   Visual Acuity Right Eye Distance:   Left Eye Distance:   Bilateral Distance:    Right Eye Near:   Left Eye Near:    Bilateral Near:  Physical Exam Vitals and nursing note reviewed.  Constitutional:      General: He is not in acute distress.    Appearance: Normal appearance. He is normal weight. He is not ill-appearing or toxic-appearing.  HENT:     Head: Normocephalic.     Right Ear: Tympanic membrane, ear canal and external ear normal. There is no impacted cerumen.     Left Ear: Tympanic membrane, ear canal and external ear normal. There is no impacted cerumen.     Nose: Nose normal. No congestion.     Mouth/Throat:     Mouth: Mucous membranes are moist.     Pharynx: Oropharynx is clear. No oropharyngeal exudate or posterior oropharyngeal erythema.  Cardiovascular:     Rate and Rhythm: Normal rate and regular rhythm.     Pulses: Normal pulses.     Heart sounds: Normal heart sounds. No murmur.  Pulmonary:     Effort: Pulmonary effort is normal. No respiratory distress.     Breath sounds: Normal breath sounds. No wheezing or rhonchi.  Chest:     Chest wall: No tenderness.  Neurological:     Mental Status: He is alert and oriented to person, place, and time.      UC Treatments / Results  Labs (all labs ordered are listed, but only abnormal results are displayed) Labs Reviewed  NOVEL CORONAVIRUS, NAA    EKG   Radiology No results found.  Procedures Procedures (including critical care time)  Medications Ordered in UC Medications - No data to display  Initial Impression / Assessment and Plan / UC Course  I have reviewed the triage vital signs and the nursing notes.  Pertinent labs & imaging results that were available during my care of the patient were reviewed by me and considered in my medical decision  making (see chart for details).    Patient is stable for discharge.  Was advised to quarantine until COVID-19 test result become available.  To go to ED for worsening symptoms.  Declined cough medication prescription. Final Clinical Impressions(s) / UC Diagnoses   Final diagnoses:  Close exposure to COVID-19 virus     Discharge Instructions     COVID testing ordered.  It will take between 2-7 days for test results.  Someone will contact you regarding abnormal results.    In the meantime: You should remain isolated in your home for 10 days from symptom onset AND greater than 24 hours after symptoms resolution (absence of fever without the use of fever-reducing medication and improvement in respiratory symptoms), whichever is longer Get plenty of rest and push fluids Use medications daily for symptom relief Use OTC medications like ibuprofen or tylenol as needed fever or pain Call or go to the ED if you have any new or worsening symptoms such as fever, worsening cough, shortness of breath, chest tightness, chest pain, turning blue, changes in mental status, etc...     ED Prescriptions    None     PDMP not reviewed this encounter.   Emerson Monte, [place] 07/13/19 [phone]

## 2019-07-14 LAB — NOVEL CORONAVIRUS, NAA: SARS-CoV-2, NAA: DETECTED — AB

## 2019-07-14 LAB — SARS-COV-2, NAA 2 DAY TAT

## 2019-07-15 ENCOUNTER — Telehealth: Payer: Self-pay | Admitting: Physician Assistant

## 2019-07-15 NOTE — Telephone Encounter (Signed)
Called to discuss with [name] about Covid symptoms and the use of bamlanivimab/etesevimab or casirivimab/imdevimab, a monoclonal antibody infusion for those with mild to moderate Covid symptoms and at a high risk of hospitalization.     Pt is qualified for this infusion at the [facility] infusion center due to co-morbid conditions and/or a member of an at-risk group, however declines infusion at this time since he is feeling so much better already. Symptoms tier reviewed as well as criteria for ending isolation.  Symptoms reviewed that would warrant ED/Hospital evaluation. Preventative practices reviewed. Patient verbalized understanding. Patient advised to call back if he decides that he does want to get infusion. Callback number to the infusion center given. Patient advised to go to Urgent care or ED with severe symptoms. Last date pt would be eligible for infusion is 4/28.    Patient Active Problem List   Diagnosis Date Noted  . Status post total left knee replacement 03/21/2019  . S/P carpal tunnel release right 02/22/19 03/08/2019  . Carpal tunnel syndrome of right wrist   . HTN (hypertension) 10/27/2018  . Prostate cancer ([place]) 05/30/2017  . Malignant neoplasm of prostate ([place]) 04/28/2017  . Chronic pain of both knees 02/21/2017  . Unilateral primary osteoarthritis, left knee 02/21/2017  . Unilateral primary osteoarthritis, right knee 02/21/2017    Angelena Form PA-C

## 2019-08-01 ENCOUNTER — Other Ambulatory Visit: Payer: Self-pay | Admitting: Cardiology

## 2019-08-01 DIAGNOSIS — I10 Essential (primary) hypertension: Secondary | ICD-10-CM

## 2019-08-27 ENCOUNTER — Other Ambulatory Visit: Payer: Self-pay | Admitting: Orthopaedic Surgery

## 2019-11-19 ENCOUNTER — Ambulatory Visit: Payer: 59

## 2019-12-03 ENCOUNTER — Ambulatory Visit: Payer: 59 | Admitting: Orthopaedic Surgery

## 2019-12-13 ENCOUNTER — Other Ambulatory Visit: Payer: Self-pay | Admitting: Orthopaedic Surgery

## 2020-02-11 ENCOUNTER — Other Ambulatory Visit: Payer: Self-pay | Admitting: Cardiology

## 2020-02-11 DIAGNOSIS — I10 Essential (primary) hypertension: Secondary | ICD-10-CM

## 2020-05-14 ENCOUNTER — Other Ambulatory Visit: Payer: Self-pay | Admitting: Cardiology

## 2020-05-14 DIAGNOSIS — I10 Essential (primary) hypertension: Secondary | ICD-10-CM

## 2020-05-15 ENCOUNTER — Encounter (HOSPITAL_COMMUNITY): Payer: Self-pay

## 2020-05-15 ENCOUNTER — Other Ambulatory Visit: Payer: Self-pay | Admitting: Urology

## 2020-05-15 NOTE — Patient Instructions (Addendum)
DUE TO COVID-19 ONLY TWO  VISITORS are  ALLOWED TO COME WITH YOU AND STAY IN THE WAITING ROOM ONLY DURING PRE OP AND PROCEDURE DAY OF SURGERY. TWO  VISITORS  MAY VISIT WITH YOU AFTER SURGERY IN YOUR PRIVATE ROOM DURING VISITING HOURS ONLY!  YOU NEED TO HAVE A COVID 19 TEST ON_2-28-22______ @_______ , THIS TEST MUST BE DONE BEFORE SURGERY,  COVID TESTING SITE [address] [place] [address], IT IS ON THE RIGHT GOING OUT [address] APPROXIMATELY  2 MINUTES PAST [employer] ON THE RIGHT. ONCE YOUR COVID TEST IS COMPLETED,  PLEASE BEGIN THE QUARANTINE INSTRUCTIONS AS OUTLINED IN YOUR HANDOUT.                Armoni Depass  05/15/2020   Your procedure is scheduled on: 05-22-20   Report to [facility] Main  Entrance   Report to admitting at     0700 AM     Call this number if you have problems the morning of surgery [phone]    Remember: Do not eat food  :After Midnight.you may have clear liquids until       0600 am then nothing by mouth     CLEAR LIQUID DIET                                                                Black Coffee and tea, regular and decaf                              Plain Jell-O any favor except red or purple                                         Fruit ices (not with fruit pulp)                                      Iced Popsicles                                    Carbonated beverages, regular and diet                                    Cranberry, grape and apple juices Sports drinks like Gatorade Lightly seasoned clear broth or consume(fat free) Sugar, honey syrup  _____________________________________________________________________      BRUSH YOUR TEETH MORNING OF SURGERY AND RINSE YOUR MOUTH OUT, NO CHEWING GUM CANDY OR MINTS.     Take these medicines the morning of surgery with A SIP OF WATER: rosuvastatin, lexapro, oxybutin    How to Manage Your Diabetes Before and After Surgery  Why is it important to  control my blood sugar before and after surgery? . Improving blood sugar levels before and after surgery helps healing and can limit problems. . A way of improving blood sugar control is eating a healthy diet by: o  Eating less sugar and carbohydrates o  Increasing activity/exercise o  Talking with your doctor about reaching your blood sugar goals . High blood sugars (greater than 180 mg/dL) can raise your risk of infections and slow your recovery, so you will need to focus on controlling your diabetes during the weeks before surgery. . Make sure that the doctor who takes care of your diabetes knows about your planned surgery including the date and location.  How do I manage my blood sugar before surgery? . Check your blood sugar at least 4 times a day, starting 2 days before surgery, to make sure that the level is not too high or low. o Check your blood sugar the morning of your surgery when you wake up and every 2 hours until you get to the Short Stay unit. . If your blood sugar is less than 70 mg/dL, you will need to treat for low blood sugar: o Do not take insulin. o Treat a low blood sugar (less than 70 mg/dL) with  cup of clear juice (cranberry or apple), 4 glucose tablets, OR glucose gel. o Recheck blood sugar in 15 minutes after treatment (to make sure it is greater than 70 mg/dL). If your blood sugar is not greater than 70 mg/dL on recheck, call [phone] for further instructions. . Report your blood sugar to the short stay nurse when you get to Short Stay.  . If you are admitted to the hospital after surgery: o Your blood sugar will be checked by the staff and you will probably be given insulin after surgery (instead of oral diabetes medicines) to make sure you have good blood sugar levels. o The goal for blood sugar control after surgery is 80-180 mg/dL.   WHAT DO I DO ABOUT MY DIABETES MEDICATION?  Marland Kitchen Do not take oral diabetes medicines (pills) the morning of surgery. Georgina Quint take 50% of dose morning of surgery .      Marland Kitchen The day of surgery, do not take other diabetes injectables, including Byetta (exenatide), Bydureon (exenatide ER), Victoza (liraglutide), or Trulicity (dulaglutide).  . If your CBG is greater than 220 mg/dL, you may take  of your sliding scale  . (correction) dose of insulin.    =                    You may not have any metal on your body including hair pins and              piercings  Do not wear jewelrylotions, powders or perfumes, deodorant                   Men may shave face and neck.   Do not bring valuables to the hospital. [place].  Contacts, dentures or bridgework may not be worn into surgery.      Patients discharged the day of surgery will not be allowed to drive home. IF YOU ARE HAVING SURGERY AND GOING HOME THE SAME DAY, YOU MUST HAVE AN ADULT TO DRIVE YOU HOME AND BE WITH YOU FOR 24 HOURS. YOU MAY GO HOME BY TAXI OR UBER OR ORTHERWISE, BUT AN ADULT MUST ACCOMPANY YOU HOME AND STAY WITH YOU FOR 24 HOURS.  Name and phone number of your driver:  Special Instructions: N/A  Please read over the following fact sheets you were given: _____________________________________________________________________  [facility] - Preparing for Surgery Before surgery, you can play an important role.  Because skin is not sterile, your skin needs to be as free of germs as possible.  You can reduce the number of germs on your skin by washing with CHG (chlorahexidine gluconate) soap before surgery.  CHG is an antiseptic cleaner which kills germs and bonds with the skin to continue killing germs even after washing. Please DO NOT use if you have an allergy to CHG or antibacterial soaps.  If your skin becomes reddened/irritated stop using the CHG and inform your nurse when you arrive at Short Stay. Do not shave (including legs and underarms) for at least 48 hours prior to the first  CHG shower.  You may shave your face/neck. Please follow these instructions carefully:  1.  Shower with CHG Soap the night before surgery and the  morning of Surgery.  2.  If you choose to wash your hair, wash your hair first as usual with your  normal  shampoo.  3.  After you shampoo, rinse your hair and body thoroughly to remove the  shampoo.                           4.  Use CHG as you would any other liquid soap.  You can apply chg directly  to the skin and wash                       Gently with a scrungie or clean washcloth.  5.  Apply the CHG Soap to your body ONLY FROM THE NECK DOWN.   Do not use on face/ open                           Wound or open sores. Avoid contact with eyes, ears mouth and genitals (private parts).                       Wash face,  Genitals (private parts) with your normal soap.             6.  Wash thoroughly, paying special attention to the area where your surgery  will be performed.  7.  Thoroughly rinse your body with warm water from the neck down.  8.  DO NOT shower/wash with your normal soap after using and rinsing off  the CHG Soap.                9.  Pat yourself dry with a clean towel.            10.  Wear clean pajamas.            11.  Place clean sheets on your bed the night of your first shower and do not  sleep with pets. Day of Surgery : Do not apply any lotions/deodorants the morning of surgery.  Please wear clean clothes to the hospital/surgery center.  FAILURE TO FOLLOW THESE INSTRUCTIONS MAY RESULT IN THE CANCELLATION OF YOUR SURGERY PATIENT SIGNATURE_________________________________  NURSE SIGNATURE__________________________________  ________________________________________________________________________

## 2020-05-15 NOTE — Progress Notes (Addendum)
PCP - [name], MD Cardiologist - no  PPM/ICD -  Device Orders -  Rep Notified -   Chest x-ray -  EKG -  Stress Test - 2020 epic ECHO - 2020 epic Cardiac Cath -   Sleep Study -  CPAP -   Fasting Blood Sugar - 160-200 Checks Blood Sugar _3-4__ times a day  Blood Thinner Instructions: Aspirin Instructions:  ERAS Protcol - PRE-SURGERY Ensure or G2-   COVID TEST- 2-28  Activity--able to walk a flight of stairs without SOB Anesthesia review: DM,HTN, Libre glucose monitor, hgba1c 9.1  Patient denies shortness of breath, fever, cough and chest pain at PAT appointment  none   All instructions explained to the patient, with a verbal understanding of the material. Patient agrees to go over the instructions while at home for a better understanding. Patient also instructed to self quarantine after being tested for COVID-19. The opportunity to ask questions was provided.

## 2020-05-19 ENCOUNTER — Encounter (HOSPITAL_COMMUNITY)
Admission: RE | Admit: 2020-05-19 | Discharge: 2020-05-19 | Disposition: A | Discharge status: Home / Self Care | Payer: 59 | Source: Ambulatory Visit | Attending: Urology | Admitting: Urology

## 2020-05-19 ENCOUNTER — Other Ambulatory Visit (HOSPITAL_COMMUNITY)
Admission: RE | Admit: 2020-05-19 | Discharge: 2020-05-19 | Disposition: A | Discharge status: Home / Self Care | Payer: 59 | Source: Ambulatory Visit | Attending: Urology | Admitting: Urology

## 2020-05-19 ENCOUNTER — Encounter (HOSPITAL_COMMUNITY): Payer: Self-pay

## 2020-05-19 ENCOUNTER — Other Ambulatory Visit: Payer: Self-pay

## 2020-05-19 DIAGNOSIS — Z01818 Encounter for other preprocedural examination: Secondary | ICD-10-CM | POA: Insufficient documentation

## 2020-05-19 DIAGNOSIS — U071 COVID-19: Secondary | ICD-10-CM | POA: Insufficient documentation

## 2020-05-19 LAB — CBC
HCT: 43.2 % (ref 39.0–52.0)
Hemoglobin: 15.4 g/dL (ref 13.0–17.0)
MCH: 31.4 pg (ref 26.0–34.0)
MCHC: 35.6 g/dL (ref 30.0–36.0)
MCV: 88 fL (ref 80.0–100.0)
Platelets: 145 10*3/uL — ABNORMAL LOW (ref 150–400)
RBC: 4.91 MIL/uL (ref 4.22–5.81)
RDW: 12 % (ref 11.5–15.5)
WBC: 5.2 10*3/uL (ref 4.0–10.5)
nRBC: 0 % (ref 0.0–0.2)

## 2020-05-19 LAB — HEMOGLOBIN A1C
Hgb A1c MFr Bld: 9.1 % — ABNORMAL HIGH (ref 4.8–5.6)
Mean Plasma Glucose: 214.47 mg/dL

## 2020-05-19 LAB — BASIC METABOLIC PANEL
Anion gap: 9 (ref 5–15)
BUN: 19 mg/dL (ref 8–23)
CO2: 26 mmol/L (ref 22–32)
Calcium: 8.9 mg/dL (ref 8.9–10.3)
Chloride: 102 mmol/L (ref 98–111)
Creatinine, Ser: 0.99 mg/dL (ref 0.61–1.24)
GFR, Estimated: >60 mL/min (ref >60)
Glucose, Bld: 282 mg/dL — ABNORMAL HIGH (ref 70–99)
Potassium: 4.4 mmol/L (ref 3.5–5.1)
Sodium: 137 mmol/L (ref 135–145)

## 2020-05-19 LAB — GLUCOSE, CAPILLARY: Glucose-Capillary: 280 mg/dL — ABNORMAL HIGH (ref 70–99)

## 2020-05-20 LAB — SARS CORONAVIRUS 2 (TAT 6-24 HRS): SARS Coronavirus 2: POSITIVE — AB

## 2020-05-21 ENCOUNTER — Telehealth: Payer: Self-pay

## 2020-05-21 NOTE — Telephone Encounter (Signed)
Called to discuss with patient about COVID-19 symptoms and the use of one of the available treatments for those with mild to moderate Covid symptoms and at a high risk of hospitalization.  Pt appears to qualify for outpatient treatment due to co-morbid conditions and/or a member of an at-risk group in accordance with the FDA Emergency Use Authorization.    Symptom onset: Sinus congestion 05/18/20 Vaccinated: No Booster? No Immunocompromised? No Qualifiers: HTN  Declines any further treatment.  [name]

## 2020-05-26 NOTE — Progress Notes (Signed)
Mr. And Mrs. Sookram made aware to arrive at 1:30PM 06/02/20, instructions reviewed , they verbalized understanding.

## 2020-05-30 NOTE — H&P (Signed)
Office Visit Report     05/14/2020   --------------------------------------------------------------------------------   [name]  MRN: [id number]  DOB: [birth year], 65 year old Male  SSN: [id number]   PRIMARY CARE:  Mark Morita, MD  REFERRING:  Mark Morita, MD  PROVIDER:  Raynelle Beard, M.D.  LOCATION:  [facility] - [phone]     --------------------------------------------------------------------------------   CC/HPI: 1. Prostate cancer  2. Bladder neck stenosis/urinary retention   For full details, please see my prior note. [name] returns today after having developed urinary retention acutely last week. He was evaluated by [name] and was noted to have a pinpoint stricture at his anastomotic site requiring placement of a wire and subsequent serial dilation. He has had an indwelling catheter since and follows up today for further evaluation. He is not tolerating his catheter well last couple of days and has been quite irritated and is interested in having it removed.     ALLERGIES: No Allergies    MEDICATIONS: Trimix (40 mcg PGE, 30 mg PAP, 0.5 mg PHE) 5 mL vial (Please provide needles, alcohol swabs, etc.)  Trimix (40 mcg PGE, 30 mg PAP, 1 mg PHE) 5 mL vial (Please provide needles, alcohol swabs, etc.)  Trimix (20 mcg PGE, 30 mg PAP, 0.5 mg PHE) 5 mL vial (Please provide needles, alcohol swabs, etc.)  Aleve PRN  Escitalopram Oxalate 5 mg tablet  Glimepiride 1 mg tablet  Losartan-Hydrochlorothiazide 50 mg-12.5 mg tablet  Temazepam  Trulicity  Vascepa 1 gram capsule     Notes: unable to remove diazepam and tramadol from chart -ajc,cma   GU PSH: Cysto Dilate Stricture (M or F) - 05/08/2020 Cystoscopy - 05/25/2019 Laparoscopy; Lymphadenectomy - 2019 Locm 300-399Mg /Ml Iodine,1Ml - 06/04/2019 Penile Injection - 08/10/2019 Prostate Needle Biopsy - 2019 Robotic Radical Prostatectomy - 2019     NON-GU PSH: Incisional hernia repair (open) Knee  replacement, Bilateral Shoulder Surgery (Unspecified), Right Surgical Pathology, Gross And Microscopic Examination For Prostate Needle - 2019     GU PMH: Prostate Cancer - 05/08/2020, - 12/18/2019, - 2019 Rising PSA after prostate cancer treatment - 05/08/2020 Urinary Retention - 05/08/2020 Metastatic pelvic lymphadenopathy - 12/18/2019, - 2019 ED due to arterial insufficiency (Stable) - 08/10/2019, - 05/25/2019 Gross hematuria - 05/25/2019 Stress Incontinence - 2020, - 02/22/2018, - 11/14/2017, - 2019, - 2019, - 2019, - 2019, - 2019, - 2019 Elevated PSA - 2019, He had an elevated PSA with possible prostatitis. His symptoms are a little better post bactrim but his prostate is diffusely firm. I am going to repeat a PSA today and if it is not falling, I will have him return for a prostate [place] and biopsy. I reviewed the risks of the biopsy including bleeding, infection and voiding difficulty. If the PSA is falling, I will give him a month of antibiotics and recheck him. , - 2018 Disorder of male genital organs, unspecified (Worsening, Chronic), Right, Ketorolac 30 mg IM today. Continue with good scrotal support. SMZ-TMP DS 1 po BID X 10 days. - 2018 BPH w/LUTS, He has persistent LUTS with an IPSS of 21. I am going to resume the tamsulosin and will have him return for a flowrate and PVR. - 2018 Right testicular pain, He has right testicular pain that may be referred from the right groin. He is tender in the groin and could have a stain. I have recommended aleve 2 tabs bid for a couple of weeks. - 2018 Weak Urinary Stream - 2018  PMH Notes:   1) Prostate cancer: He is s/p a NNS RAL radical prostatectomy and BPLND on 05/30/17. His PSA remained elevated postoperatively (0.5) and he started salvage ADT in July 2019 with plans to proceed with radiation therapy after recovering continence. He completed salvage radiation (68.4 Gy) from March through April 2020.   Diagnosis: pT3b N1 Mx, Gleason 4+5=9  adenocarcinoma with positive surgical margins (4/29 lymph nodes positive)  Pretreatment PSA: 15.6  Pretreatment SHIM score: 25   2) Hematuria: He developed intermittent gross hematuria in January 2021 after having completed salvage radiation the year prior.   Mar 2021: Cystoscopy - negative, CT imaging - negative   NON-GU PMH: Arthritis Diabetes Type 2    FAMILY HISTORY: Cancer - Mother Death of family member - Mother, Father    Notes: 1 daughter (adopted)   SOCIAL HISTORY: Marital Status: Married Preferred Language: English; Ethnicity: Not Hispanic Or Latino; Race: White Current Smoking Status: Patient has never smoked.   Tobacco Use Assessment Completed: Used Tobacco in last 30 days? Does not use smokeless tobacco. Has never drank.  Drinks 3 caffeinated drinks per day. Patient's occupation Engineer, maintenance.    REVIEW OF SYSTEMS:    GU Review Male:   Patient denies frequent urination, hard to postpone urination, burning/ pain with urination, get up at night to urinate, leakage of urine, stream starts and stops, trouble starting your streams, and have to strain to urinate .  Gastrointestinal (Upper):   Patient denies nausea and vomiting.  Gastrointestinal (Lower):   Patient denies diarrhea and constipation.  Constitutional:   Patient denies fever, night sweats, weight loss, and fatigue.  Skin:   Patient denies skin rash/ lesion and itching.  Eyes:   Patient denies blurred vision and double vision.  Ears/ Nose/ Throat:   Patient denies sore throat and sinus problems.  Hematologic/Lymphatic:   Patient denies swollen glands and easy bruising.  Cardiovascular:   Patient denies leg swelling and chest pains.  Respiratory:   Patient denies cough and shortness of breath.  Endocrine:   Patient denies excessive thirst.  Musculoskeletal:   Patient denies back pain and joint pain.  Neurological:   Patient denies headaches and dizziness.  Psychologic:   Patient denies depression and anxiety.    VITAL SIGNS:      05/14/2020 04:23 PM  Weight 195 lb / 88.45 kg  Height 69 in / 175.26 cm  BP 113/54 mmHg  Pulse 76 /min  Temperature 98.2 F / 36.7 C  BMI 28.8 kg/m   MULTI-SYSTEM PHYSICAL EXAMINATION:    Constitutional: Well-nourished. No physical deformities. Normally developed. Good grooming.  Respiratory: No labored breathing, no use of accessory muscles. Clear bilaterally.  Cardiovascular: Normal temperature, normal extremity pulses, no swelling, no varicosities. Regular rate and rhythm.     Complexity of Data:  Records Review:   Previous Patient Records   03/18/20 12/06/19 08/23/19 02/21/19 11/21/18 04/17/18 12/14/17 11/22/17  PSA  Total PSA 0.43 ng/mL 0.23 ng/mL 0.11 ng/mL 0.024 ng/mL <0.015 ng/mL <0.015 ng/mL 0.016 ng/mL 0.050 ng/mL    12/06/19 05/18/19 02/21/19 11/21/18 04/17/18 11/22/17  Hormones  Testosterone, Total 231.6 ng/dL 193.5 ng/dL 163.9 ng/dL 17.3 ng/dL 11.1 ng/dL <10 ng/dL    PROCEDURES:         Voiding Trial - 51700  Voided Volume: 20 cc  Instilled Volume: 180 cc   ASSESSMENT:      ICD-10 Details  1 GU:   Prostate Cancer - C61   2   Metastatic pelvic lymphadenopathy -  C77.5   3   Other atresia and stenosis of urethra and bladder neck - Q64.39    PLAN:           Schedule Return Visit/Planned Activity: Other See Visit Notes             Note: Will call to schedule surgery          Document Letter(s):  Created for Patient: Clinical Summary         Notes:   1. Prostate cancer: He is scheduled for follow-up in approximately 1 month and will keep that appointment.   2. Urinary retention/bladder neck stenosis: He did undergo a successful voiding trial today. After further discussion considering the risk of potential recurrence, he was interested in proceeding with cystoscopy under anesthesia with probable balloon dilation for more definitive treatment. We reviewed the potential risks and complications associated with this procedure. He understands  the possible need for catheter postoperatively. This will be scheduled for the near future.   Cc: Dr. Berneta Sages  Dr. Tyler Pita         Next Appointment:      Next Appointment: 06/11/2020 08:15 AM    Appointment Type: Laboratory Appointment    Location: [facility] [phone]    Provider: Lab LAB    Reason for Visit: 6 mnths psa-Tyanne Derocher      * Signed by [name], M.D. on 05/14/20 at 6:18 PM (EST)*

## 2020-06-02 ENCOUNTER — Ambulatory Visit (HOSPITAL_COMMUNITY): Payer: 59 | Admitting: Certified Registered"

## 2020-06-02 ENCOUNTER — Encounter (HOSPITAL_COMMUNITY): Payer: Self-pay | Admitting: Urology

## 2020-06-02 ENCOUNTER — Ambulatory Visit (HOSPITAL_COMMUNITY)
Admission: RE | Admit: 2020-06-02 | Discharge: 2020-06-02 | Disposition: A | Discharge status: Home / Self Care | Payer: 59 | Attending: Urology | Admitting: Urology

## 2020-06-02 ENCOUNTER — Encounter (HOSPITAL_COMMUNITY)
Admission: RE | Disposition: A | Discharge status: Home / Self Care | Payer: Self-pay | Source: Home / Self Care | Attending: Urology

## 2020-06-02 ENCOUNTER — Ambulatory Visit (HOSPITAL_COMMUNITY): Payer: 59 | Admitting: Physician Assistant

## 2020-06-02 DIAGNOSIS — N401 Enlarged prostate with lower urinary tract symptoms: Secondary | ICD-10-CM | POA: Diagnosis not present

## 2020-06-02 DIAGNOSIS — N320 Bladder-neck obstruction: Secondary | ICD-10-CM | POA: Insufficient documentation

## 2020-06-02 DIAGNOSIS — R338 Other retention of urine: Secondary | ICD-10-CM | POA: Insufficient documentation

## 2020-06-02 DIAGNOSIS — Z9079 Acquired absence of other genital organ(s): Secondary | ICD-10-CM | POA: Insufficient documentation

## 2020-06-02 DIAGNOSIS — Z923 Personal history of irradiation: Secondary | ICD-10-CM | POA: Insufficient documentation

## 2020-06-02 DIAGNOSIS — R310 Gross hematuria: Secondary | ICD-10-CM | POA: Diagnosis not present

## 2020-06-02 DIAGNOSIS — N35919 Unspecified urethral stricture, male, unspecified site: Secondary | ICD-10-CM | POA: Insufficient documentation

## 2020-06-02 DIAGNOSIS — Z8546 Personal history of malignant neoplasm of prostate: Secondary | ICD-10-CM | POA: Diagnosis not present

## 2020-06-02 DIAGNOSIS — Z79899 Other long term (current) drug therapy: Secondary | ICD-10-CM | POA: Insufficient documentation

## 2020-06-02 DIAGNOSIS — Z96653 Presence of artificial knee joint, bilateral: Secondary | ICD-10-CM | POA: Diagnosis not present

## 2020-06-02 DIAGNOSIS — Z7984 Long term (current) use of oral hypoglycemic drugs: Secondary | ICD-10-CM | POA: Insufficient documentation

## 2020-06-02 HISTORY — PX: CYSTOSCOPY WITH URETHRAL DILATATION: SHX5125

## 2020-06-02 LAB — GLUCOSE, CAPILLARY
Glucose-Capillary: 160 mg/dL — ABNORMAL HIGH (ref 70–99)
Glucose-Capillary: 137 mg/dL — ABNORMAL HIGH (ref 70–99)

## 2020-06-02 SURGERY — CYSTOSCOPY, WITH URETHRAL DILATION
Anesthesia: General

## 2020-06-02 MED ORDER — OXYCODONE HCL 5 MG/5ML PO SOLN
5.0000 mg | Freq: Once | ORAL | Status: DC | PRN
Start: 2020-06-02 — End: 2020-06-02

## 2020-06-02 MED ORDER — PROPOFOL 10 MG/ML IV BOLUS
INTRAVENOUS | Status: AC
  Filled 2020-06-02: qty 20

## 2020-06-02 MED ORDER — PROMETHAZINE HCL 25 MG/ML IJ SOLN
6.2500 mg | INTRAMUSCULAR | Status: DC | PRN
Start: 2020-06-02 — End: 2020-06-02

## 2020-06-02 MED ORDER — ONDANSETRON HCL 4 MG/2ML IJ SOLN
INTRAMUSCULAR | Status: DC | PRN
  Administered 2020-06-02: 4 mg via INTRAVENOUS

## 2020-06-02 MED ORDER — LIDOCAINE 2% (20 MG/ML) 5 ML SYRINGE
INTRAMUSCULAR | Status: DC | PRN
  Administered 2020-06-02: 60 mg via INTRAVENOUS

## 2020-06-02 MED ORDER — LIDOCAINE 2% (20 MG/ML) 5 ML SYRINGE
INTRAMUSCULAR | Status: AC
  Filled 2020-06-02: qty 5

## 2020-06-02 MED ORDER — ORAL CARE MOUTH RINSE: 15.0000 mL | Freq: Once | OROMUCOSAL | Status: AC

## 2020-06-02 MED ORDER — LACTATED RINGERS IV SOLN: INTRAVENOUS | Status: DC

## 2020-06-02 MED ORDER — OXYCODONE HCL 5 MG PO TABS
5.0000 mg | ORAL_TABLET | Freq: Once | ORAL | Status: DC | PRN
Start: 2020-06-02 — End: 2020-06-02

## 2020-06-02 MED ORDER — AMISULPRIDE (ANTIEMETIC) 5 MG/2ML IV SOLN: 10.0000 mg | Freq: Once | INTRAVENOUS | Status: DC | PRN

## 2020-06-02 MED ORDER — DEXAMETHASONE SODIUM PHOSPHATE 10 MG/ML IJ SOLN
INTRAMUSCULAR | Status: AC
  Filled 2020-06-02: qty 1

## 2020-06-02 MED ORDER — PHENYLEPHRINE 40 MCG/ML (10ML) SYRINGE FOR IV PUSH (FOR BLOOD PRESSURE SUPPORT)
PREFILLED_SYRINGE | INTRAVENOUS | Status: DC | PRN
  Administered 2020-06-02: 120 ug via INTRAVENOUS

## 2020-06-02 MED ORDER — CHLORHEXIDINE GLUCONATE 0.12 % MT SOLN
15.0000 mL | Freq: Once | OROMUCOSAL | Status: AC
  Administered 2020-06-02: 15 mL via OROMUCOSAL

## 2020-06-02 MED ORDER — DEXAMETHASONE SODIUM PHOSPHATE 10 MG/ML IJ SOLN
INTRAMUSCULAR | Status: DC | PRN
  Administered 2020-06-02: 4 mg via INTRAVENOUS

## 2020-06-02 MED ORDER — HYDROMORPHONE HCL 1 MG/ML IJ SOLN
0.2500 mg | INTRAMUSCULAR | Status: DC | PRN
Start: 2020-06-02 — End: 2020-06-02

## 2020-06-02 MED ORDER — STERILE WATER FOR IRRIGATION IR SOLN
Status: DC | PRN
  Administered 2020-06-02: 3000 mL

## 2020-06-02 MED ORDER — ONDANSETRON HCL 4 MG/2ML IJ SOLN
INTRAMUSCULAR | Status: AC
  Filled 2020-06-02: qty 2

## 2020-06-02 MED ORDER — SODIUM CHLORIDE 0.9 % IV SOLN
2.0000 g | Freq: Once | INTRAVENOUS | Status: AC
  Administered 2020-06-02: 2 g via INTRAVENOUS
  Filled 2020-06-02: qty 20

## 2020-06-02 MED ORDER — PROPOFOL 10 MG/ML IV BOLUS
INTRAVENOUS | Status: DC | PRN
  Administered 2020-06-02: 200 mg via INTRAVENOUS

## 2020-06-02 SURGICAL SUPPLY — 20 items
BAG DRN RND TRDRP ANRFLXCHMBR (UROLOGICAL SUPPLIES)
BAG URINE DRAIN 2000ML AR STRL (UROLOGICAL SUPPLIES) IMPLANT
BALLN NEPHROSTOMY (BALLOONS) ×2
BALLOON NEPHROSTOMY (BALLOONS) ×1 IMPLANT
CATH FOLEY 2W COUNCIL 20FR 5CC (CATHETERS) IMPLANT
CATH INTERMIT  6FR 70CM (CATHETERS) IMPLANT
CATH ROBINSON RED A/P 14FR (CATHETERS) IMPLANT
CATH URET 5FR 28IN CONE TIP (BALLOONS)
CATH URET 5FR 70CM CONE TIP (BALLOONS) IMPLANT
CLOTH BEACON ORANGE TIMEOUT ST (SAFETY) ×2 IMPLANT
GLOVE SURG ENC MOIS LTX SZ7.5 (GLOVE) ×2 IMPLANT
GOWN STRL REUS W/TWL LRG LVL3 (GOWN DISPOSABLE) ×4 IMPLANT
GUIDEWIRE ANG ZIPWIRE 038X150 (WIRE) IMPLANT
GUIDEWIRE STR DUAL SENSOR (WIRE) ×2 IMPLANT
KIT TURNOVER KIT A (KITS) ×2 IMPLANT
MANIFOLD NEPTUNE II (INSTRUMENTS) ×2 IMPLANT
NS IRRIG 1000ML POUR BTL (IV SOLUTION) IMPLANT
PACK CYSTO (CUSTOM PROCEDURE TRAY) ×2 IMPLANT
PENCIL SMOKE EVACUATOR (MISCELLANEOUS) IMPLANT
WATER STERILE IRR 3000ML UROMA (IV SOLUTION) ×2 IMPLANT

## 2020-06-02 NOTE — Op Note (Signed)
Preoperative diagnosis: Bladder neck stenosis  Postoperative diagnosis: Bladder neck stenosis  Procedure: Cystoscopy and balloon dilation of urethral stricture/bladder neck stenosis  Surgeon: Pryor Curia MD  Anesthesia: General  Complications: None  EBL: Minimal  Specimens: None  Indication: [name] is a 65 year old gentleman with a history of advanced prostate cancer status post radical prostatectomy and radiation therapy.  He recently developed a bladder neck stenosis following salvage radiation therapy.  He underwent dilation in the office with catheter placement.  A subsequent voiding trial was unsuccessful and he presented back in urinary retention.  He presents today for definitive balloon dilation of his stricture.  The potential risks, complications, and the expected recovery process were discussed in detail.  Informed consent was obtained.  Description of procedure: The patient was taken to the operating room and a general anesthetic was administered.  He was given preoperative antibiotics, placed in the dorsolithotomy position, and prepped and draped in usual sterile fashion.  Next, a preoperative timeout was performed.  Cystourethroscopy was then performed with a 49 French rigid cystoscope.  This revealed a normal anterior urethra.  The patient did have some traumatic changes to the bladder neck and posterior urethra consistent with his prior serial dilation.  Inspection of bladder revealed edematous changes as expected as he did have an indwelling catheter.  A 0.38 sensor guidewire was then advanced through the cystoscope into the bladder.  A 24 French Ultraxx dilating balloon was then advanced over the wire and across the bladder neck.  This was then inflated to a pressure of 18 mmHg.  This was left inflated for approximately 5 minutes and then let down.  Reinspection with cystoscopy revealed a widely patent bladder neck and urethra.  The patient's catheter will be left out  and he will be administered a voiding trial in the recovery room.  He tolerated the procedure well without complications.

## 2020-06-02 NOTE — Interval H&P Note (Signed)
History and Physical Interval Note:  06/02/2020 1:51 PM  [name]  has presented today for surgery, with the diagnosis of URETHRAL STRICTURE.  The various methods of treatment have been discussed with the patient and family. After consideration of risks, benefits and other options for treatment, the patient has consented to  Procedure(s) with comments: CYSTOSCOPY WITH URETHRAL DILATATION (N/A) - ONLY NEEDS 30 MIN as a surgical intervention.  The patient's history has been reviewed, patient examined, no change in status, stable for surgery.  I have reviewed the patient's chart and labs.  Questions were answered to the patient's satisfaction.     [employer]

## 2020-06-02 NOTE — Anesthesia Postprocedure Evaluation (Signed)
Anesthesia Post Note  Patient: [name]  Procedure(s) Performed: CYSTOSCOPY WITH URETHRAL DILATATION (N/A )     Patient location during evaluation: PACU Anesthesia Type: General Level of consciousness: awake and alert Pain management: pain level controlled Vital Signs Assessment: post-procedure vital signs reviewed and stable Respiratory status: spontaneous breathing, nonlabored ventilation and respiratory function stable Cardiovascular status: blood pressure returned to baseline and stable Postop Assessment: no apparent nausea or vomiting Anesthetic complications: no   No complications documented.  Last Vitals:  Vitals:   06/02/20 1600 06/02/20 1625  BP: (!) 144/90 (!) 141/76  Pulse: 66 63  Resp: 18   Temp: 36.7 C 36.5 C  SpO2: 97% 100%    Last Pain:  Vitals:   06/02/20 1625  TempSrc: Oral  PainSc:                  Lynda Rainwater

## 2020-06-02 NOTE — Transfer of Care (Signed)
Immediate Anesthesia Transfer of Care Note  Patient: [name]  Procedure(s) Performed: CYSTOSCOPY WITH URETHRAL DILATATION (N/A )  Patient Location: PACU  Anesthesia Type:General  Level of Consciousness: awake  Airway & Oxygen Therapy: Patient Spontanous Breathing  Post-op Assessment: Report given to RN and Post -op Vital signs reviewed and stable  Post vital signs: Reviewed and stable  Last Vitals:  Vitals Value Taken Time  BP 134/90 06/02/20 1534  Temp    Pulse 61 06/02/20 1537  Resp 12 06/02/20 1537  SpO2 95 % 06/02/20 1537  Vitals shown include unvalidated device data.  Last Pain:  Vitals:   06/02/20 1407  TempSrc:   PainSc: 0-No pain         Complications: No complications documented.

## 2020-06-02 NOTE — Anesthesia Procedure Notes (Signed)
Procedure Name: LMA Insertion °Performed by: Jerris Keltz H, CRNA °Pre-anesthesia Checklist: Patient identified, Emergency Drugs available, Suction available and Patient being monitored °Patient Re-evaluated:Patient Re-evaluated prior to induction °Oxygen Delivery Method: Circle System Utilized °Preoxygenation: Pre-oxygenation with 100% oxygen °Induction Type: IV induction °Ventilation: Mask ventilation without difficulty °LMA: LMA inserted °LMA Size: 4.0 °Number of attempts: 1 °Airway Equipment and Method: Bite block °Placement Confirmation: positive ETCO2 °Tube secured with: Tape °Dental Injury: Teeth and Oropharynx as per pre-operative assessment  ° ° ° ° ° ° °

## 2020-06-02 NOTE — OR Nursing (Signed)
Spoke with Dr. Alinda Money to make sure he is okay and aware that patient is leaking uncontrollable.  He is okay with this and okay to send patient home.

## 2020-06-02 NOTE — Discharge Instructions (Addendum)
1. You may see some blood in the urine and may have some burning with urination for 48-72 hours. You also may notice that you have to urinate more frequently or urgently after your procedure which is normal.  2. You should call should you develop an inability urinate, fever > 101, persistent nausea and vomiting that prevents you from eating or drinking to stay hydrated.   General Anesthesia, Adult, Care After This sheet gives you information about how to care for yourself after your procedure. Your health care provider may also give you more specific instructions. If you have problems or questions, contact your health care provider. What can I expect after the procedure? After the procedure, the following side effects are common:  Pain or discomfort at the IV site.  Nausea.  Vomiting.  Sore throat.  Trouble concentrating.  Feeling cold or chills.  Feeling weak or tired.  Sleepiness and fatigue.  Soreness and body aches. These side effects can affect parts of the body that were not involved in surgery. Follow these instructions at home: For the time period you were told by your health care provider:  Rest.  Do not participate in activities where you could fall or become injured.  Do not drive or use machinery.  Do not drink alcohol.  Do not take sleeping pills or medicines that cause drowsiness.  Do not make important decisions or sign legal documents.  Do not take care of children on your own.   Eating and drinking  Follow any instructions from your health care provider about eating or drinking restrictions.  When you feel hungry, start by eating small amounts of foods that are soft and easy to digest (bland), such as toast. Gradually return to your regular diet.  Drink enough fluid to keep your urine pale yellow.  If you vomit, rehydrate by drinking water, juice, or clear broth. General instructions  If you have sleep apnea, surgery and certain medicines can  increase your risk for breathing problems. Follow instructions from your health care provider about wearing your sleep device: ? Anytime you are sleeping, including during daytime naps. ? While taking prescription pain medicines, sleeping medicines, or medicines that make you drowsy.  Have a responsible adult stay with you for the time you are told. It is important to have someone help care for you until you are awake and alert.  Return to your normal activities as told by your health care provider. Ask your health care provider what activities are safe for you.  Take over-the-counter and prescription medicines only as told by your health care provider.  If you smoke, do not smoke without supervision.  Keep all follow-up visits as told by your health care provider. This is important. Contact a health care provider if:  You have nausea or vomiting that does not get better with medicine.  You cannot eat or drink without vomiting.  You have pain that does not get better with medicine.  You are unable to pass urine.  You develop a skin rash.  You have a fever.  You have redness around your IV site that gets worse. Get help right away if:  You have difficulty breathing.  You have chest pain.  You have blood in your urine or stool, or you vomit blood. Summary  After the procedure, it is common to have a sore throat or nausea. It is also common to feel tired.  Have a responsible adult stay with you for the time you are told.  It is important to have someone help care for you until you are awake and alert.  When you feel hungry, start by eating small amounts of foods that are soft and easy to digest (bland), such as toast. Gradually return to your regular diet.  Drink enough fluid to keep your urine pale yellow.  Return to your normal activities as told by your health care provider. Ask your health care provider what activities are safe for you. This information is not intended  to replace advice given to you by your health care provider. Make sure you discuss any questions you have with your health care provider. Document Revised: 11/22/2019 Document Reviewed: 06/21/2019 Elsevier Patient Education  2021 [employer].

## 2020-06-02 NOTE — Anesthesia Preprocedure Evaluation (Signed)
Anesthesia Evaluation  Patient identified by MRN, date of birth, ID band Patient awake    Reviewed: Allergy & Precautions, NPO status , Patient's Chart, lab work & pertinent test results  Airway Mallampati: II  TM Distance: >3 FB Neck ROM: Full    Dental no notable dental hx. (+) Teeth Intact   Pulmonary neg pulmonary ROS,    Pulmonary exam normal breath sounds clear to auscultation       Cardiovascular hypertension, Pt. on medications Normal cardiovascular exam Rhythm:Regular Rate:Normal     Neuro/Psych negative neurological ROS  negative psych ROS   GI/Hepatic negative GI ROS, Neg liver ROS,   Endo/Other  diabetes, Type 2  Renal/GU negative Renal ROS     Musculoskeletal  (+) Arthritis , Osteoarthritis,    Abdominal   Peds negative pediatric ROS (+)  Hematology negative hematology ROS (+)   Anesthesia Other Findings   Reproductive/Obstetrics                             Anesthesia Physical  Anesthesia Plan  ASA: III  Anesthesia Plan: General   Post-op Pain Management:    Induction: Intravenous  PONV Risk Score and Plan: 3 and Treatment may vary due to age or medical condition, Ondansetron, Midazolam and Dexamethasone  Airway Management Planned: LMA  Additional Equipment: None  Intra-op Plan:   Post-operative Plan: Extubation in OR  Informed Consent: I have reviewed the patients History and Physical, chart, labs and discussed the procedure including the risks, benefits and alternatives for the proposed anesthesia with the patient or authorized representative who has indicated his/her understanding and acceptance.     Dental advisory given  Plan Discussed with: CRNA  Anesthesia Plan Comments:         Anesthesia Quick Evaluation

## 2020-06-03 ENCOUNTER — Encounter (HOSPITAL_COMMUNITY): Payer: Self-pay | Admitting: Urology

## 2020-07-28 ENCOUNTER — Other Ambulatory Visit: Payer: Self-pay | Admitting: Orthopaedic Surgery

## 2020-08-13 ENCOUNTER — Encounter: Payer: Self-pay | Admitting: Physician Assistant

## 2020-08-13 ENCOUNTER — Ambulatory Visit (INDEPENDENT_AMBULATORY_CARE_PROVIDER_SITE_OTHER): Payer: 59 | Admitting: Physician Assistant

## 2020-08-13 ENCOUNTER — Other Ambulatory Visit: Payer: Self-pay

## 2020-08-13 ENCOUNTER — Ambulatory Visit (INDEPENDENT_AMBULATORY_CARE_PROVIDER_SITE_OTHER): Payer: 59

## 2020-08-13 ENCOUNTER — Encounter (HOSPITAL_COMMUNITY): Payer: Self-pay | Admitting: Orthopaedic Surgery

## 2020-08-13 ENCOUNTER — Other Ambulatory Visit: Payer: Self-pay | Admitting: Physician Assistant

## 2020-08-13 VITALS — BP 118/77 | HR 99 | Temp 98.8°F

## 2020-08-13 DIAGNOSIS — M25462 Effusion, left knee: Secondary | ICD-10-CM | POA: Diagnosis not present

## 2020-08-13 DIAGNOSIS — Z96652 Presence of left artificial knee joint: Secondary | ICD-10-CM

## 2020-08-13 NOTE — Progress Notes (Signed)
PCP - Dr Dr Dagmar Hait  Cardiologist - n/a  Chest x-ray - n/a EKG - 05/19/20 Stress Test - 12/04/18 ECHO - 12/07/18 Cardiac Cath - n/a  Sleep Study -  n/a CPAP - n/a  Fasting Blood Sugar - 180s-200s Checks Blood Sugar 3-4 times a day  . Do not take glimepiride on the morning of surgery.  . THE MORNING OF SURGERY, take 32 units of Tresiba Insulin.  . If your blood sugar is less than 70 mg/dL, you will need to treat for low blood sugar: o Treat a low blood sugar (less than 70 mg/dL) with  cup of clear juice (cranberry or apple), 4 glucose tablets, OR glucose gel. o Recheck blood sugar in 15 minutes after treatment (to make sure it is greater than 70 mg/dL). If your blood sugar is not greater than 70 mg/dL on recheck, call [phone] for further instructions.  STOP now taking any Aspirin (unless otherwise instructed by your surgeon), Aleve, Naproxen, Ibuprofen, Motrin, Advil, Goody's, BC's, all herbal medications, fish oil, and all vitamins.   Coronavirus Screening Patient denies any covid symptoms. Do you have any of the following symptoms:  Cough yes/no: No Fever (>100.61F)  yes/no: No Runny nose yes/no: No Sore throat yes/no: No Difficulty breathing/shortness of breath  yes/no: No  Have you traveled in the last 14 days and where? yes/no: No  Patient verbalized understanding of instructions that were given via phone.

## 2020-08-13 NOTE — Progress Notes (Signed)
Office Visit Note   Patient: [name]           Date of Birth: 07-03-1955           MRN: [id number] Visit Date: 08/13/2020              Requested by: Mark Solian, MD [address] [place],  [place] [address] PCP: Mark Solian, MD   Assessment & Plan: Visit Diagnoses:  1. Status post total left knee replacement   2. Effusion, left knee     Plan: We will send the aspirate for culture, sensitivity Gram stain.  We will set him up for a left total knee irrigation debridement with polyexchange.  He will need IV antibiotics empirically postop.  Questions were encouraged and answered by Dr. Ninfa Beard myself.  Follow-Up Instructions: Return 2 weeks postop.   Orders:  Orders Placed This Encounter  Procedures  . Gram stain  . Anaerobic and Aerobic Culture  . XR Knee 1-2 Views Left  . Cell count + diff,  w/ cryst-synvl fld   No orders of the defined types were placed in this encounter.     Procedures: No procedures performed   Clinical Data: No additional findings.   Subjective: Chief Complaint  Patient presents with  . Left Knee - Routine Post Op, Pain    HPI [name] is 65 year old male well-known to Dr. Ninfa Beard service with a history of a left total knee arthroplasty 03/21/2019.  He unfortunately has had on and off bladder infections.  He is also had a history of prostate cancer with radical prostatectomy and radiation therapy.  He has had no antibiotics for least few weeks.  He reports planting flowers 2 days ago.  No known injury to the knee but did have some pain after planting flowers.  However yesterday he developed severe pain in the knee and developed redness up and down the leg.  He denies any fevers.  He notes swelling of the left leg since his prostate surgery but he states the swelling in his left leg is became worse over the last day.  He does usually wear compression hose.  He has had some calf pain.  He is now ambulating with a pair of  crutches due to the pain in the left knee.   Review of Systems Denies any shortness of breath chest pain.  Positive for chills.  Please see HPI otherwise negative  Objective: Vital Signs: BP 118/77   Pulse 99   Temp 98.8 F (37.1 C)   Physical Exam General: Well-developed well-nourished male in obvious pain but in no acute distress.  Nondiaphoretic.  Ambulating with crutches. Psych: Alert and oriented x3 Ortho Exam Left knee is well-healed surgical incision.  There is obvious erythema up down the left leg.  Slight warmth about the knee.  Any attempts of range of motion causes significant pain.  Positive effusion.  Left knee is prepped by Dr. Ninfa Beard with Betadine and then 3 cc of lidocaine was used to anesthetize the knee.  Then approximately 70 cc of yellow turbid aspirate is obtained.  Lateral anterior aspect of the proximal tibia there is a large nummular area with scaling and slight erythema.  There is no drainage from this nummular area. Specialty Comments:  No specialty comments available.  Imaging: XR Knee 1-2 Views Left  Result Date: 08/13/2020 Left knee 2 views: No acute fracture.  Left total knee arthroplasty components appear well seated.  AP views quite oblique though.  No  obvious signs of loosening.    PMFS History: Patient Active Problem List   Diagnosis Date Noted  . Status post total left knee replacement 03/21/2019  . S/P carpal tunnel release right 02/22/19 03/08/2019  . Carpal tunnel syndrome of right wrist   . HTN (hypertension) 10/27/2018  . Prostate cancer ([name]) 05/30/2017  . Malignant neoplasm of prostate ([place]) 04/28/2017  . Chronic pain of both knees 02/21/2017  . Unilateral primary osteoarthritis, left knee 02/21/2017  . Unilateral primary osteoarthritis, right knee 02/21/2017   Past Medical History:  Diagnosis Date  . Arthritis   . Depression   . Diabetes mellitus without complication ([place]) 9024   Diabetes Type II  . GERD (gastroesophageal reflux  disease)    diet controlled  . HTN (hypertension) 10/27/2018  . Hyperlipidemia   . Internal hemorrhoids 2014   noted on colonoscopy  . Osteoarthritis of both knees   . Prostate cancer [facility])    prostate  . Sigmoid diverticulosis 2014   Mild, noted on colonoscopy    Family History  Problem Relation Age of Onset  . Cancer Mother        small cell lung    Past Surgical History:  Procedure Laterality Date  . CARPAL TUNNEL RELEASE Right 02/22/2019   Procedure: CARPAL TUNNEL RELEASE;  Surgeon: Carole Civil, MD;  Location: [facility];  Service: Orthopedics;  Laterality: Right;  . COLONOSCOPY N/A 09/13/2012   Procedure: COLONOSCOPY;  Surgeon: Danie Binder, MD;  Location[facility];  Service: Endoscopy;  Laterality: N/A;  9:30 AM  . CYSTOSCOPY WITH URETHRAL DILATATION N/A 06/02/2020   Procedure: CYSTOSCOPY WITH URETHRAL DILATATION;  Surgeon: [name], MD;  Location: [facility];  Service: Urology;  Laterality: N/A;  ONLY NEEDS 30 MIN  . HERNIA REPAIR     20 years ago  . KNEE ARTHROSCOPY Left    15 years ago  . LYMPHADENECTOMY Bilateral 05/30/2017   Procedure: LYMPHADENECTOMY, PELVIC EXTENDED;  Surgeon: [name], MD;  [facility];  Service: Urology;  Laterality: Bilateral;  . ROBOT ASSISTED LAPAROSCOPIC RADICAL PROSTATECTOMY N/A 05/30/2017   Procedure: XI ROBOTIC ASSISTED LAPAROSCOPIC RADICAL PROSTATECTOMY LEVEL 3;  Surgeon: [name], MD;  [facility];  Service: Urology;  Laterality: N/A;  . SHOULDER ARTHROSCOPY Right    15 years ago  . TOTAL KNEE ARTHROPLASTY Left 03/21/2019   Procedure: LEFT TOTAL KNEE ARTHROPLASTY;  Surgeon: Mcarthur Rossetti, MD;  Location: [facility];  Service: Orthopedics;  Laterality: Left;   Social History   Occupational History  . Occupation: pastor  Tobacco Use  . Smoking status: Never Smoker  . Smokeless tobacco: Never Used  Vaping Use  . Vaping Use: Never used  Substance and Sexual Activity  . Alcohol use: No  . Drug use:  No  . Sexual activity: Yes

## 2020-08-14 ENCOUNTER — Inpatient Hospital Stay (HOSPITAL_COMMUNITY): Payer: 59 | Admitting: Certified Registered Nurse Anesthetist

## 2020-08-14 ENCOUNTER — Encounter (HOSPITAL_COMMUNITY): Payer: Self-pay | Admitting: Orthopaedic Surgery

## 2020-08-14 ENCOUNTER — Encounter (HOSPITAL_COMMUNITY)
Admission: RE | Disposition: A | Discharge status: Home Health | Payer: Self-pay | Source: Home / Self Care | Attending: Orthopaedic Surgery

## 2020-08-14 ENCOUNTER — Inpatient Hospital Stay (HOSPITAL_COMMUNITY)
Admission: RE | Admit: 2020-08-14 | Discharge: 2020-08-18 | DRG: 487 | Disposition: A | Discharge status: Home Health | Payer: 59 | Attending: Orthopaedic Surgery | Admitting: Orthopaedic Surgery

## 2020-08-14 DIAGNOSIS — Z79899 Other long term (current) drug therapy: Secondary | ICD-10-CM

## 2020-08-14 DIAGNOSIS — Y831 Surgical operation with implant of artificial internal device as the cause of abnormal reaction of the patient, or of later complication, without mention of misadventure at the time of the procedure: Secondary | ICD-10-CM | POA: Diagnosis present

## 2020-08-14 DIAGNOSIS — Z8546 Personal history of malignant neoplasm of prostate: Secondary | ICD-10-CM | POA: Diagnosis not present

## 2020-08-14 DIAGNOSIS — I890 Lymphedema, not elsewhere classified: Secondary | ICD-10-CM | POA: Diagnosis not present

## 2020-08-14 DIAGNOSIS — T8454XA Infection and inflammatory reaction due to internal left knee prosthesis, initial encounter: Principal | ICD-10-CM

## 2020-08-14 DIAGNOSIS — E1165 Type 2 diabetes mellitus with hyperglycemia: Secondary | ICD-10-CM | POA: Diagnosis present

## 2020-08-14 DIAGNOSIS — B9561 Methicillin susceptible Staphylococcus aureus infection as the cause of diseases classified elsewhere: Secondary | ICD-10-CM | POA: Diagnosis present

## 2020-08-14 DIAGNOSIS — I10 Essential (primary) hypertension: Secondary | ICD-10-CM | POA: Diagnosis present

## 2020-08-14 DIAGNOSIS — Z9079 Acquired absence of other genital organ(s): Secondary | ICD-10-CM

## 2020-08-14 DIAGNOSIS — F32A Depression, unspecified: Secondary | ICD-10-CM | POA: Diagnosis present

## 2020-08-14 DIAGNOSIS — Z7984 Long term (current) use of oral hypoglycemic drugs: Secondary | ICD-10-CM | POA: Diagnosis not present

## 2020-08-14 DIAGNOSIS — E785 Hyperlipidemia, unspecified: Secondary | ICD-10-CM | POA: Diagnosis present

## 2020-08-14 DIAGNOSIS — E119 Type 2 diabetes mellitus without complications: Secondary | ICD-10-CM | POA: Diagnosis not present

## 2020-08-14 DIAGNOSIS — M25462 Effusion, left knee: Secondary | ICD-10-CM | POA: Diagnosis not present

## 2020-08-14 DIAGNOSIS — M1712 Unilateral primary osteoarthritis, left knee: Secondary | ICD-10-CM

## 2020-08-14 DIAGNOSIS — Z794 Long term (current) use of insulin: Secondary | ICD-10-CM | POA: Diagnosis not present

## 2020-08-14 DIAGNOSIS — A4901 Methicillin susceptible Staphylococcus aureus infection, unspecified site: Secondary | ICD-10-CM | POA: Diagnosis not present

## 2020-08-14 DIAGNOSIS — G8929 Other chronic pain: Secondary | ICD-10-CM

## 2020-08-14 DIAGNOSIS — M25562 Pain in left knee: Secondary | ICD-10-CM

## 2020-08-14 DIAGNOSIS — Z96652 Presence of left artificial knee joint: Secondary | ICD-10-CM

## 2020-08-14 DIAGNOSIS — E11610 Type 2 diabetes mellitus with diabetic neuropathic arthropathy: Secondary | ICD-10-CM

## 2020-08-14 DIAGNOSIS — C61 Malignant neoplasm of prostate: Secondary | ICD-10-CM | POA: Diagnosis not present

## 2020-08-14 HISTORY — PX: I & D KNEE WITH POLY EXCHANGE: SHX5024

## 2020-08-14 HISTORY — DX: Gastro-esophageal reflux disease without esophagitis: K21.9

## 2020-08-14 HISTORY — DX: Depression, unspecified: F32.A

## 2020-08-14 LAB — CBC
HCT: 42.3 % (ref 39.0–52.0)
Hemoglobin: 14.7 g/dL (ref 13.0–17.0)
MCH: 30.4 pg (ref 26.0–34.0)
MCHC: 34.8 g/dL (ref 30.0–36.0)
MCV: 87.6 fL (ref 80.0–100.0)
Platelets: 124 10*3/uL — ABNORMAL LOW (ref 150–400)
RBC: 4.83 MIL/uL (ref 4.22–5.81)
RDW: 13.1 % (ref 11.5–15.5)
WBC: 13 10*3/uL — ABNORMAL HIGH (ref 4.0–10.5)
nRBC: 0 % (ref 0.0–0.2)

## 2020-08-14 LAB — GLUCOSE, CAPILLARY
Glucose-Capillary: 260 mg/dL — ABNORMAL HIGH (ref 70–99)
Glucose-Capillary: 208 mg/dL — ABNORMAL HIGH (ref 70–99)
Glucose-Capillary: 234 mg/dL — ABNORMAL HIGH (ref 70–99)
Glucose-Capillary: 196 mg/dL — ABNORMAL HIGH (ref 70–99)
Glucose-Capillary: 162 mg/dL — ABNORMAL HIGH (ref 70–99)
Glucose-Capillary: 150 mg/dL — ABNORMAL HIGH (ref 70–99)

## 2020-08-14 LAB — BASIC METABOLIC PANEL
Anion gap: 10 (ref 5–15)
BUN: 21 mg/dL (ref 8–23)
CO2: 21 mmol/L — ABNORMAL LOW (ref 22–32)
Calcium: 8.6 mg/dL — ABNORMAL LOW (ref 8.9–10.3)
Chloride: 99 mmol/L (ref 98–111)
Creatinine, Ser: 1.45 mg/dL — ABNORMAL HIGH (ref 0.61–1.24)
GFR, Estimated: 54 mL/min — ABNORMAL LOW (ref >60)
Glucose, Bld: 253 mg/dL — ABNORMAL HIGH (ref 70–99)
Potassium: 3.6 mmol/L (ref 3.5–5.1)
Sodium: 130 mmol/L — ABNORMAL LOW (ref 135–145)

## 2020-08-14 LAB — HEMOGLOBIN A1C
Hgb A1c MFr Bld: 9 % — ABNORMAL HIGH (ref 4.8–5.6)
Mean Plasma Glucose: 212 mg/dL

## 2020-08-14 SURGERY — IRRIGATION AND DEBRIDEMENT KNEE WITH POLY EXCHANGE
Anesthesia: Spinal | Site: Knee | Laterality: Left

## 2020-08-14 MED ORDER — INSULIN ASPART 100 UNIT/ML IJ SOLN: 0.0000 [IU] | Freq: Every day | INTRAMUSCULAR | Status: DC

## 2020-08-14 MED ORDER — LIDOCAINE 2% (20 MG/ML) 5 ML SYRINGE
INTRAMUSCULAR | Status: AC
  Filled 2020-08-14: qty 5

## 2020-08-14 MED ORDER — PHENYLEPHRINE 40 MCG/ML (10ML) SYRINGE FOR IV PUSH (FOR BLOOD PRESSURE SUPPORT)
PREFILLED_SYRINGE | INTRAVENOUS | Status: DC | PRN
  Administered 2020-08-14: 80 ug via INTRAVENOUS
  Administered 2020-08-14 (×2): 40 ug via INTRAVENOUS

## 2020-08-14 MED ORDER — OXYBUTYNIN CHLORIDE 5 MG PO TABS: 5.0000 mg | ORAL_TABLET | Freq: Three times a day (TID) | ORAL | Status: DC

## 2020-08-14 MED ORDER — LOSARTAN POTASSIUM-HCTZ 50-12.5 MG PO TABS: 1.0000 | ORAL_TABLET | Freq: Every day | ORAL | Status: DC

## 2020-08-14 MED ORDER — CHLORHEXIDINE GLUCONATE 0.12 % MT SOLN
OROMUCOSAL | Status: AC
  Administered 2020-08-14: 15 mL via OROMUCOSAL
  Filled 2020-08-14: qty 15

## 2020-08-14 MED ORDER — LACTATED RINGERS IV SOLN: INTRAVENOUS | Status: DC

## 2020-08-14 MED ORDER — MIDAZOLAM HCL 2 MG/2ML IJ SOLN: 1.0000 mg | Freq: Once | INTRAMUSCULAR | Status: AC

## 2020-08-14 MED ORDER — FENTANYL CITRATE (PF) 100 MCG/2ML IJ SOLN
INTRAMUSCULAR | Status: AC
  Administered 2020-08-14: 50 ug via INTRAVENOUS
  Filled 2020-08-14: qty 2

## 2020-08-14 MED ORDER — FENTANYL CITRATE (PF) 250 MCG/5ML IJ SOLN
INTRAMUSCULAR | Status: AC
  Filled 2020-08-14: qty 5

## 2020-08-14 MED ORDER — MIDAZOLAM HCL 2 MG/2ML IJ SOLN
INTRAMUSCULAR | Status: AC
  Administered 2020-08-14: 1 mg via INTRAVENOUS
  Filled 2020-08-14: qty 2

## 2020-08-14 MED ORDER — METHOCARBAMOL 500 MG PO TABS
500.0000 mg | ORAL_TABLET | Freq: Four times a day (QID) | ORAL | Status: DC | PRN
  Administered 2020-08-14 – 2020-08-18 (×10): 500 mg via ORAL
  Filled 2020-08-14 (×12): qty 1

## 2020-08-14 MED ORDER — DIPHENHYDRAMINE HCL 12.5 MG/5ML PO ELIX
12.5000 mg | ORAL_SOLUTION | ORAL | Status: DC | PRN
Start: 2020-08-14 — End: 2020-08-18

## 2020-08-14 MED ORDER — INSULIN ASPART 100 UNIT/ML IJ SOLN
INTRAMUSCULAR | Status: AC
  Administered 2020-08-14: 8 [IU] via SUBCUTANEOUS
  Filled 2020-08-14: qty 1

## 2020-08-14 MED ORDER — METOCLOPRAMIDE HCL 5 MG PO TABS: 5.0000 mg | ORAL_TABLET | Freq: Three times a day (TID) | ORAL | Status: DC | PRN

## 2020-08-14 MED ORDER — HYDROMORPHONE HCL 1 MG/ML IJ SOLN: 0.2500 mg | INTRAMUSCULAR | Status: DC | PRN

## 2020-08-14 MED ORDER — ACETAMINOPHEN 10 MG/ML IV SOLN
INTRAVENOUS | Status: AC
  Filled 2020-08-14: qty 100

## 2020-08-14 MED ORDER — PHENYLEPHRINE HCL-NACL 10-0.9 MG/250ML-% IV SOLN
0.0000 ug/min | INTRAVENOUS | Status: DC
  Filled 2020-08-14: qty 250

## 2020-08-14 MED ORDER — SODIUM CHLORIDE 0.9 % IV SOLN
2.0000 g | INTRAVENOUS | Status: DC
  Administered 2020-08-14: 2 g via INTRAVENOUS
  Filled 2020-08-14: qty 20

## 2020-08-14 MED ORDER — ONDANSETRON HCL 4 MG PO TABS: 4.0000 mg | ORAL_TABLET | Freq: Four times a day (QID) | ORAL | Status: DC | PRN

## 2020-08-14 MED ORDER — BUPIVACAINE-EPINEPHRINE (PF) 0.5% -1:200000 IJ SOLN
INTRAMUSCULAR | Status: DC | PRN
  Administered 2020-08-14: 20 mL via PERINEURAL

## 2020-08-14 MED ORDER — BUPIVACAINE IN DEXTROSE 0.75-8.25 % IT SOLN
INTRATHECAL | Status: DC | PRN
  Administered 2020-08-14: 1.6 mL via INTRATHECAL

## 2020-08-14 MED ORDER — ONDANSETRON HCL 4 MG/2ML IJ SOLN
INTRAMUSCULAR | Status: DC | PRN
  Administered 2020-08-14: 4 mg via INTRAVENOUS

## 2020-08-14 MED ORDER — ORAL CARE MOUTH RINSE: 15.0000 mL | Freq: Once | OROMUCOSAL | Status: AC

## 2020-08-14 MED ORDER — INSULIN ASPART 100 UNIT/ML ~~LOC~~ SOLN: 5.0000 [IU] | Freq: Three times a day (TID) | SUBCUTANEOUS | Status: DC

## 2020-08-14 MED ORDER — INSULIN ASPART 100 UNIT/ML IJ SOLN: 8.0000 [IU] | Freq: Once | INTRAMUSCULAR | Status: AC

## 2020-08-14 MED ORDER — SODIUM CHLORIDE 0.9 % IR SOLN
Status: DC | PRN
  Administered 2020-08-14 (×2): 3000 mL

## 2020-08-14 MED ORDER — VANCOMYCIN HCL 1000 MG IV SOLR
INTRAVENOUS | Status: AC
  Filled 2020-08-14: qty 1000

## 2020-08-14 MED ORDER — PROPOFOL 500 MG/50ML IV EMUL
INTRAVENOUS | Status: DC | PRN
  Administered 2020-08-14: 75 ug/kg/min via INTRAVENOUS

## 2020-08-14 MED ORDER — ACETAMINOPHEN 325 MG PO TABS
325.0000 mg | ORAL_TABLET | Freq: Four times a day (QID) | ORAL | Status: DC | PRN
Start: 2020-08-15 — End: 2020-08-18

## 2020-08-14 MED ORDER — ONDANSETRON HCL 4 MG/2ML IJ SOLN
INTRAMUSCULAR | Status: AC
  Filled 2020-08-14: qty 2

## 2020-08-14 MED ORDER — VANCOMYCIN HCL 1250 MG/250ML IV SOLN
1250.0000 mg | INTRAVENOUS | Status: AC
  Administered 2020-08-14: 1250 mg via INTRAVENOUS
  Filled 2020-08-14: qty 250

## 2020-08-14 MED ORDER — PHENYLEPHRINE HCL-NACL 10-0.9 MG/250ML-% IV SOLN
INTRAVENOUS | Status: DC | PRN
  Administered 2020-08-14: 40 ug/min via INTRAVENOUS

## 2020-08-14 MED ORDER — ICOSAPENT ETHYL 1 G PO CAPS: 2.0000 g | ORAL_CAPSULE | Freq: Every day | ORAL | Status: DC

## 2020-08-14 MED ORDER — FENTANYL CITRATE (PF) 100 MCG/2ML IJ SOLN
50.0000 ug | Freq: Once | INTRAMUSCULAR | Status: AC
Start: 2020-08-14 — End: 2020-08-14

## 2020-08-14 MED ORDER — MIDAZOLAM HCL 5 MG/5ML IJ SOLN
INTRAMUSCULAR | Status: DC | PRN
  Administered 2020-08-14: 1 mg via INTRAVENOUS

## 2020-08-14 MED ORDER — DEXAMETHASONE SODIUM PHOSPHATE 10 MG/ML IJ SOLN
INTRAMUSCULAR | Status: AC
  Filled 2020-08-14: qty 1

## 2020-08-14 MED ORDER — CEFAZOLIN SODIUM-DEXTROSE 2-4 GM/100ML-% IV SOLN
2.0000 g | INTRAVENOUS | Status: AC
  Administered 2020-08-14: 2 g via INTRAVENOUS

## 2020-08-14 MED ORDER — DOCUSATE SODIUM 100 MG PO CAPS
100.0000 mg | ORAL_CAPSULE | Freq: Two times a day (BID) | ORAL | Status: DC
  Administered 2020-08-14 – 2020-08-18 (×8): 100 mg via ORAL
  Filled 2020-08-14 (×8): qty 1

## 2020-08-14 MED ORDER — PROPOFOL 10 MG/ML IV BOLUS
INTRAVENOUS | Status: AC
  Filled 2020-08-14: qty 20

## 2020-08-14 MED ORDER — TRANEXAMIC ACID-NACL 1000-0.7 MG/100ML-% IV SOLN
1000.0000 mg | INTRAVENOUS | Status: AC
  Administered 2020-08-14: 1000 mg via INTRAVENOUS

## 2020-08-14 MED ORDER — METOCLOPRAMIDE HCL 5 MG/ML IJ SOLN
5.0000 mg | Freq: Three times a day (TID) | INTRAMUSCULAR | Status: DC | PRN
Start: 2020-08-14 — End: 2020-08-18

## 2020-08-14 MED ORDER — LOSARTAN POTASSIUM 50 MG PO TABS
50.0000 mg | ORAL_TABLET | Freq: Every day | ORAL | Status: DC
  Administered 2020-08-15 – 2020-08-18 (×4): 50 mg via ORAL
  Filled 2020-08-14 (×4): qty 1

## 2020-08-14 MED ORDER — VANCOMYCIN HCL 1000 MG IV SOLR
INTRAVENOUS | Status: DC | PRN
  Administered 2020-08-14: 1000 mg

## 2020-08-14 MED ORDER — HYDROMORPHONE HCL 1 MG/ML IJ SOLN
0.5000 mg | INTRAMUSCULAR | Status: DC | PRN
  Administered 2020-08-14 – 2020-08-16 (×2): 1 mg via INTRAVENOUS
  Filled 2020-08-14 (×2): qty 1

## 2020-08-14 MED ORDER — GLIMEPIRIDE 1 MG PO TABS: 1.0000 mg | ORAL_TABLET | Freq: Every day | ORAL | Status: DC

## 2020-08-14 MED ORDER — SODIUM CHLORIDE 0.9 % IV SOLN: INTRAVENOUS | Status: DC

## 2020-08-14 MED ORDER — ONDANSETRON HCL 4 MG/2ML IJ SOLN: 4.0000 mg | Freq: Four times a day (QID) | INTRAMUSCULAR | Status: DC | PRN

## 2020-08-14 MED ORDER — INSULIN ASPART 100 UNIT/ML IJ SOLN
0.0000 [IU] | Freq: Three times a day (TID) | INTRAMUSCULAR | Status: DC
  Administered 2020-08-14: 3 [IU] via SUBCUTANEOUS
  Administered 2020-08-15: 2 [IU] via SUBCUTANEOUS
  Administered 2020-08-15 – 2020-08-16 (×2): 3 [IU] via SUBCUTANEOUS
  Administered 2020-08-16: 2 [IU] via SUBCUTANEOUS
  Administered 2020-08-16: 3 [IU] via SUBCUTANEOUS
  Administered 2020-08-17: 2 [IU] via SUBCUTANEOUS
  Administered 2020-08-18: 3 [IU] via SUBCUTANEOUS

## 2020-08-14 MED ORDER — PANTOPRAZOLE SODIUM 40 MG PO TBEC
40.0000 mg | DELAYED_RELEASE_TABLET | Freq: Every day | ORAL | Status: DC
  Administered 2020-08-15 – 2020-08-18 (×4): 40 mg via ORAL
  Filled 2020-08-14 (×4): qty 1

## 2020-08-14 MED ORDER — INSULIN DEGLUDEC 100 UNIT/ML ~~LOC~~ SOPN: 64.0000 [IU] | PEN_INJECTOR | Freq: Every day | SUBCUTANEOUS | Status: DC

## 2020-08-14 MED ORDER — INSULIN GLARGINE 100 UNIT/ML ~~LOC~~ SOLN
64.0000 [IU] | Freq: Every day | SUBCUTANEOUS | Status: DC
  Administered 2020-08-15 – 2020-08-18 (×4): 64 [IU] via SUBCUTANEOUS
  Filled 2020-08-14 (×4): qty 0.64

## 2020-08-14 MED ORDER — ESCITALOPRAM OXALATE 10 MG PO TABS
10.0000 mg | ORAL_TABLET | Freq: Every day | ORAL | Status: DC
  Administered 2020-08-14 – 2020-08-18 (×5): 10 mg via ORAL
  Filled 2020-08-14 (×5): qty 1

## 2020-08-14 MED ORDER — OXYCODONE HCL 5 MG PO TABS
5.0000 mg | ORAL_TABLET | ORAL | Status: DC | PRN
Start: 2020-08-14 — End: 2020-08-18
  Administered 2020-08-14 – 2020-08-16 (×4): 10 mg via ORAL
  Administered 2020-08-16 – 2020-08-17 (×2): 5 mg via ORAL
  Administered 2020-08-17: 10 mg via ORAL
  Administered 2020-08-17: 5 mg via ORAL
  Administered 2020-08-17: 10 mg via ORAL
  Administered 2020-08-18: 5 mg via ORAL
  Filled 2020-08-14 (×6): qty 2
  Filled 2020-08-14: qty 1
  Filled 2020-08-14 (×5): qty 2

## 2020-08-14 MED ORDER — DEXTROSE 5 % IV SOLN
500.0000 mg | Freq: Four times a day (QID) | INTRAVENOUS | Status: DC | PRN
  Filled 2020-08-14: qty 5

## 2020-08-14 MED ORDER — PROPOFOL 10 MG/ML IV BOLUS
INTRAVENOUS | Status: DC | PRN
  Administered 2020-08-14: 30 mg via INTRAVENOUS

## 2020-08-14 MED ORDER — EPHEDRINE SULFATE-NACL 50-0.9 MG/10ML-% IV SOSY
PREFILLED_SYRINGE | INTRAVENOUS | Status: DC | PRN
  Administered 2020-08-14 (×2): 5 mg via INTRAVENOUS

## 2020-08-14 MED ORDER — CEFAZOLIN SODIUM-DEXTROSE 2-4 GM/100ML-% IV SOLN
2.0000 g | Freq: Three times a day (TID) | INTRAVENOUS | Status: DC
  Filled 2020-08-14: qty 100

## 2020-08-14 MED ORDER — ACETAMINOPHEN 10 MG/ML IV SOLN
INTRAVENOUS | Status: DC | PRN
  Administered 2020-08-14: 1000 mg via INTRAVENOUS

## 2020-08-14 MED ORDER — MIDAZOLAM HCL 2 MG/2ML IJ SOLN
INTRAMUSCULAR | Status: AC
  Filled 2020-08-14: qty 2

## 2020-08-14 MED ORDER — LIDOCAINE 2% (20 MG/ML) 5 ML SYRINGE
INTRAMUSCULAR | Status: DC | PRN
  Administered 2020-08-14: 40 mg via INTRAVENOUS

## 2020-08-14 MED ORDER — ROSUVASTATIN CALCIUM 20 MG PO TABS
20.0000 mg | ORAL_TABLET | Freq: Every day | ORAL | Status: DC
  Administered 2020-08-14 – 2020-08-18 (×5): 20 mg via ORAL
  Filled 2020-08-14 (×5): qty 1

## 2020-08-14 MED ORDER — SODIUM CHLORIDE 0.9 % IV BOLUS
500.0000 mL | Freq: Once | INTRAVENOUS | Status: AC
  Administered 2020-08-14: 500 mL via INTRAVENOUS

## 2020-08-14 MED ORDER — CEFAZOLIN SODIUM-DEXTROSE 2-4 GM/100ML-% IV SOLN
INTRAVENOUS | Status: AC
  Filled 2020-08-14: qty 100

## 2020-08-14 MED ORDER — TRANEXAMIC ACID-NACL 1000-0.7 MG/100ML-% IV SOLN
INTRAVENOUS | Status: AC
  Filled 2020-08-14: qty 100

## 2020-08-14 MED ORDER — OXYCODONE HCL 5 MG PO TABS
10.0000 mg | ORAL_TABLET | ORAL | Status: DC | PRN
Start: 2020-08-14 — End: 2020-08-18
  Administered 2020-08-14 – 2020-08-16 (×4): 10 mg via ORAL
  Administered 2020-08-18: 15 mg via ORAL
  Administered 2020-08-18: 10 mg via ORAL
  Filled 2020-08-14: qty 3
  Filled 2020-08-14 (×3): qty 2

## 2020-08-14 MED ORDER — CHLORHEXIDINE GLUCONATE 0.12 % MT SOLN: 15.0000 mL | Freq: Once | OROMUCOSAL | Status: AC

## 2020-08-14 MED ORDER — POVIDONE-IODINE 10 % EX SWAB: 2.0000 "application " | Freq: Once | CUTANEOUS | Status: DC

## 2020-08-14 MED ORDER — HYDROCHLOROTHIAZIDE 12.5 MG PO CAPS
12.5000 mg | ORAL_CAPSULE | Freq: Every day | ORAL | Status: DC
  Administered 2020-08-15 – 2020-08-18 (×4): 12.5 mg via ORAL
  Filled 2020-08-14 (×4): qty 1

## 2020-08-14 SURGICAL SUPPLY — 68 items
BANDAGE ESMARK 6X9 LF (GAUZE/BANDAGES/DRESSINGS) ×1 IMPLANT
BNDG CMPR 9X6 STRL LF SNTH (GAUZE/BANDAGES/DRESSINGS) ×1
BNDG ELASTIC 6X5.8 VLCR STR LF (GAUZE/BANDAGES/DRESSINGS) ×3 IMPLANT
BNDG ESMARK 6X9 LF (GAUZE/BANDAGES/DRESSINGS) ×2
BOWL SMART MIX CTS (DISPOSABLE) ×2 IMPLANT
COVER SURGICAL LIGHT HANDLE (MISCELLANEOUS) ×2 IMPLANT
COVER WAND RF STERILE (DRAPES) ×2 IMPLANT
CUFF TOURN SGL QUICK 34 (TOURNIQUET CUFF) ×2
CUFF TOURN SGL QUICK 42 (TOURNIQUET CUFF) IMPLANT
CUFF TRNQT CYL 34X4.125X (TOURNIQUET CUFF) IMPLANT
DRAPE EXTREMITY T 121X128X90 (DISPOSABLE) ×2 IMPLANT
DRAPE HALF SHEET 40X57 (DRAPES) ×2 IMPLANT
DRAPE IMP U-DRAPE 54X76 (DRAPES) ×2 IMPLANT
DRAPE POUCH INSTRU U-SHP 10X18 (DRAPES) ×2 IMPLANT
DRAPE U-SHAPE 47X51 STRL (DRAPES) ×2 IMPLANT
DRSG ADAPTIC 3X8 NADH LF (GAUZE/BANDAGES/DRESSINGS) ×1 IMPLANT
DRSG PAD ABDOMINAL 8X10 ST (GAUZE/BANDAGES/DRESSINGS) ×3 IMPLANT
DRSG XEROFORM 1X8 (GAUZE/BANDAGES/DRESSINGS) ×1 IMPLANT
ELECT REM PT RETURN 9FT ADLT (ELECTROSURGICAL) ×2
ELECTRODE REM PT RTRN 9FT ADLT (ELECTROSURGICAL) ×1 IMPLANT
FACESHIELD WRAPAROUND (MASK) ×4 IMPLANT
FACESHIELD WRAPAROUND OR TEAM (MASK) ×1 IMPLANT
GAUZE SPONGE 4X4 12PLY STRL (GAUZE/BANDAGES/DRESSINGS) ×1 IMPLANT
GLOVE BIO SURGEON STRL SZ8 (GLOVE) ×2 IMPLANT
GLOVE ORTHO TXT STRL SZ7.5 (GLOVE) ×2 IMPLANT
GLOVE SRG 8 PF TXTR STRL LF DI (GLOVE) ×1 IMPLANT
GLOVE SURG UNDER POLY LF SZ8 (GLOVE) ×2
GOWN STRL REUS W/ TWL LRG LVL3 (GOWN DISPOSABLE) ×2 IMPLANT
GOWN STRL REUS W/ TWL XL LVL3 (GOWN DISPOSABLE) ×4 IMPLANT
GOWN STRL REUS W/TWL LRG LVL3 (GOWN DISPOSABLE) ×4
GOWN STRL REUS W/TWL XL LVL3 (GOWN DISPOSABLE) ×8
HANDPIECE INTERPULSE COAX TIP (DISPOSABLE) ×2
IMMOBILIZER KNEE 20 (SOFTGOODS)
IMMOBILIZER KNEE 20 THIGH 36 (SOFTGOODS) IMPLANT
IMMOBILIZER KNEE 22 UNIV (SOFTGOODS) ×2 IMPLANT
IMMOBILIZER KNEE 24 THIGH 36 (MISCELLANEOUS) IMPLANT
IMMOBILIZER KNEE 24 UNIV (MISCELLANEOUS)
INSERT TIB BEARING SZ 4 12 (Insert) ×1 IMPLANT
KIT BASIN OR (CUSTOM PROCEDURE TRAY) ×2 IMPLANT
KIT TURNOVER KIT B (KITS) ×2 IMPLANT
MANIFOLD NEPTUNE II (INSTRUMENTS) ×2 IMPLANT
NS IRRIG 1000ML POUR BTL (IV SOLUTION) ×2 IMPLANT
PACK TOTAL JOINT (CUSTOM PROCEDURE TRAY) ×2 IMPLANT
PAD ARMBOARD 7.5X6 YLW CONV (MISCELLANEOUS) ×4 IMPLANT
PADDING CAST ABS 4INX4YD NS (CAST SUPPLIES) ×1
PADDING CAST ABS 6INX4YD NS (CAST SUPPLIES) ×1
PADDING CAST ABS COTTON 4X4 ST (CAST SUPPLIES) IMPLANT
PADDING CAST ABS COTTON 6X4 NS (CAST SUPPLIES) IMPLANT
PADDING CAST COTTON 6X4 STRL (CAST SUPPLIES) ×4 IMPLANT
SET HNDPC FAN SPRY TIP SCT (DISPOSABLE) ×1 IMPLANT
SPONGE LAP 18X18 RF (DISPOSABLE) IMPLANT
STAPLER VISISTAT 35W (STAPLE) ×1 IMPLANT
STRIP CLOSURE SKIN 1/2X4 (GAUZE/BANDAGES/DRESSINGS) ×2 IMPLANT
SUCTION FRAZIER HANDLE 10FR (MISCELLANEOUS) ×2
SUCTION TUBE FRAZIER 10FR DISP (MISCELLANEOUS) ×1 IMPLANT
SUT MNCRL AB 4-0 PS2 18 (SUTURE) ×2 IMPLANT
SUT VIC AB 0 CT1 27 (SUTURE) ×4
SUT VIC AB 0 CT1 27XBRD ANBCTR (SUTURE) ×2 IMPLANT
SUT VIC AB 1 CT1 27 (SUTURE) ×6
SUT VIC AB 1 CT1 27XBRD ANBCTR (SUTURE) ×3 IMPLANT
SUT VIC AB 2-0 CT1 27 (SUTURE) ×6
SUT VIC AB 2-0 CT1 TAPERPNT 27 (SUTURE) ×3 IMPLANT
SYR 50ML SLIP (SYRINGE) ×2 IMPLANT
TOWEL GREEN STERILE (TOWEL DISPOSABLE) ×2 IMPLANT
TOWEL GREEN STERILE FF (TOWEL DISPOSABLE) ×2 IMPLANT
TRAY FOLEY MTR SLVR 16FR STAT (SET/KITS/TRAYS/PACK) ×2 IMPLANT
WATER STERILE IRR 1000ML POUR (IV SOLUTION) ×2 IMPLANT
WRAP KNEE MAXI GEL POST OP (GAUZE/BANDAGES/DRESSINGS) ×1 IMPLANT

## 2020-08-14 NOTE — Anesthesia Postprocedure Evaluation (Signed)
Anesthesia Post Note  Patient: Jarrod Mcenery  Procedure(s) Performed: IRRIGATION AND DEBRIDEMENT LEFT KNEE with synovectomy WITH POLY EXCHANGE (Left Knee)     Patient location during evaluation: PACU Anesthesia Type: Spinal and Regional Level of consciousness: oriented and awake and alert Pain management: pain level controlled Vital Signs Assessment: post-procedure vital signs reviewed and stable Respiratory status: spontaneous breathing and respiratory function stable Cardiovascular status: blood pressure returned to baseline and stable Postop Assessment: no headache, no backache, no apparent nausea or vomiting, spinal receding and patient able to bend at knees Anesthetic complications: no   No complications documented.  Last Vitals:  Vitals:   08/14/20 1315 08/14/20 1347  BP: 118/80 (!) 97/59  Pulse: 71 66  Resp: 14 16  Temp:  (!) 36.4 C  SpO2: 99% 98%    Last Pain:  Vitals:   08/14/20 1347  TempSrc: Oral  PainSc:                  Stori Royse,W. EDMOND

## 2020-08-14 NOTE — Transfer of Care (Signed)
Immediate Anesthesia Transfer of Care Note  Patient: [name]  Procedure(s) Performed: IRRIGATION AND DEBRIDEMENT LEFT KNEE with synovectomy WITH POLY EXCHANGE (Left Knee)  Patient Location: PACU  Anesthesia Type:MAC, Regional and Spinal  Level of Consciousness: drowsy and patient cooperative  Airway & Oxygen Therapy: Patient Spontanous Breathing and Patient connected to face mask oxygen  Post-op Assessment: Report given to RN and Post -op Vital signs reviewed and stable  Post vital signs: Reviewed  Last Vitals:  Vitals Value Taken Time  BP 95/57 08/14/20 1216  Temp    Pulse 56 08/14/20 1219  Resp 11 08/14/20 1219  SpO2 99 % 08/14/20 1219  Vitals shown include unvalidated device data.  Last Pain:  Vitals:   08/14/20 0805  PainSc: 0-No pain      Patients Stated Pain Goal: 3 (09/81/19 1478)  Complications: No complications documented.

## 2020-08-14 NOTE — Progress Notes (Signed)
Pharmacy Antibiotic Note  [name] is a 65 y.o. male admitted on 08/14/2020 with septic knee/prosthetic joint infection.  Pharmacy has been consulted for Zosyn dosing. After discussion with Ortho, agree with narrowing to ceftriaxone given likely organisms. OR culture pending   Plan: Ceftriaxone 2g Q 24 hr  Stop post-op cefazolin given duplicate cephalosporins  Height: 5\' 9"  (175.3 cm) Weight: 86.2 kg (190 lb) IBW/kg (Calculated) : 70.7  Temp (24hrs), Avg:97.9 F (36.6 C), Min:97 F (36.1 C), Max:98.8 F (37.1 C)  Recent Labs  Lab 08/14/20 0742  WBC 13.0*  CREATININE 1.45*    Estimated Creatinine Clearance: 56 mL/min (A) (by C-G formula based on SCr of 1.45 mg/dL (H)).    No Known Allergies  Antimicrobials this admission: CTX 5/26 >>     Microbiology results: 5/26 Tissue cx pending    Thank you for allowing pharmacy to be a part of this patient's care.   Benetta Spar, PharmD, BCPS, BCCP Clinical Pharmacist  Please check AMION for all [place] phone numbers After 10:00 PM, call [place] [phone]

## 2020-08-14 NOTE — Brief Op Note (Signed)
08/14/2020  11:44 AM  PATIENT:  [name]  65 y.o. male  PRE-OPERATIVE DIAGNOSIS:  infected left total knee arthroplasty  POST-OPERATIVE DIAGNOSIS:  infected left total knee arthroplasty  PROCEDURE:  Procedure(s): IRRIGATION AND DEBRIDEMENT LEFT KNEE with synovectomy WITH POLY EXCHANGE (Left)  SURGEON:  Surgeon(s) and Role:    Mcarthur Rossetti, MD - Primary  PHYSICIAN ASSISTANT:  Benita Stabile, PA-C  ANESTHESIA:   regional and spinal  EBL:  50 mL   COUNTS:  YES  TOURNIQUET:   Total Tourniquet Time Documented: Thigh (Left) - 36 minutes Total: Thigh (Left) - 36 minutes   DICTATION: .Other Dictation: Dictation Number [id number]  PLAN OF CARE: Admit to inpatient   PATIENT DISPOSITION:  PACU - hemodynamically stable.   Delay start of Pharmacological VTE agent (>24hrs) due to surgical blood loss or risk of bleeding: no

## 2020-08-14 NOTE — Anesthesia Preprocedure Evaluation (Addendum)
Anesthesia Evaluation  Patient identified by MRN, date of birth, ID band Patient awake    Reviewed: Allergy & Precautions, H&P , NPO status , Patient's Chart, lab work & pertinent test results  Airway Mallampati: II  TM Distance: >3 FB Neck ROM: Full    Dental no notable dental hx. (+) Teeth Intact, Dental Advisory Given   Pulmonary neg pulmonary ROS,    Pulmonary exam normal breath sounds clear to auscultation       Cardiovascular hypertension, Pt. on medications  Rhythm:Regular Rate:Normal     Neuro/Psych Depression negative neurological ROS     GI/Hepatic Neg liver ROS, GERD  ,  Endo/Other  diabetes, Insulin Dependent, Oral Hypoglycemic Agents  Renal/GU negative Renal ROS  negative genitourinary   Musculoskeletal  (+) Arthritis , Osteoarthritis,    Abdominal   Peds  Hematology negative hematology ROS (+)   Anesthesia Other Findings   Reproductive/Obstetrics negative OB ROS                            Anesthesia Physical Anesthesia Plan  ASA: III  Anesthesia Plan: Spinal   Post-op Pain Management:  Regional for Post-op pain   Induction: Intravenous  PONV Risk Score and Plan: 2 and Propofol infusion, Midazolam and Ondansetron  Airway Management Planned: Simple Face Mask  Additional Equipment:   Intra-op Plan:   Post-operative Plan:   Informed Consent: I have reviewed the patients History and Physical, chart, labs and discussed the procedure including the risks, benefits and alternatives for the proposed anesthesia with the patient or authorized representative who has indicated his/her understanding and acceptance.     Dental advisory given  Plan Discussed with: CRNA  Anesthesia Plan Comments:         Anesthesia Quick Evaluation

## 2020-08-14 NOTE — Anesthesia Procedure Notes (Signed)
Anesthesia Regional Block: Adductor canal block   Pre-Anesthetic Checklist: ,, timeout performed, Correct Patient, Correct Site, Correct Laterality, Correct Procedure, Correct Position, site marked, Risks and benefits discussed, pre-op evaluation,  At surgeon's request and post-op pain management  Laterality: Left  Prep: Maximum Sterile Barrier Precautions used, chloraprep       Needles:  Injection technique: Single-shot  Needle Type: Echogenic Stimulator Needle     Needle Length: 9cm  Needle Gauge: 21     Additional Needles:   Procedures:,,,, ultrasound used (permanent image in chart),,,,  Narrative:  Start time: 08/14/2020 9:42 AM End time: 08/14/2020 9:52 AM Injection made incrementally with aspirations every 5 mL.  Performed by: Personally  Anesthesiologist: Roderic Palau, MD  Additional Notes: 2% Lidocaine skin wheel.

## 2020-08-14 NOTE — H&P (Signed)
[name] is an 65 y.o. male.   Chief Complaint: Left knee joint effusion with questionable prosthetic joint infection HPI: The patient is a 65 year old gentleman well-known to me.  He underwent a right knee replacement in December 2020.  He had not had any issues of that knee.  2 days ago he was working the garden and since then the knee has been swelling and very painful.  He had some red streaking in that leg.  Of note he has had significant urologic issues over the last several months.  He has a history of prostate cancer and did require surgery.  He has had other recent issues as relates to his bladder and he has been on and off antibiotics over the last several months.  He even recently he has significant fever and chills and there is question of whether or not he was septic at that 1 time.  He had not had any previous knee swelling or issues until this week.  My concern is that his left knee prosthesis may have been seeded and is now infected.  In the office yesterday I did aspirated a large amount of thick darker fluid from his left knee and that has been sent off for cell count, gram stain and culture.  At this point I recommended an open arthrotomy of his left knee with a synovectomy and polyliner exchange as well as placement of antibiotic beads and admission for IV antibiotics in an attempt to salvage the knee.  Past Medical History:  Diagnosis Date  . Arthritis   . Depression   . Diabetes mellitus without complication ([place]) 2671   Diabetes Type II  . GERD (gastroesophageal reflux disease)    diet controlled  . HTN (hypertension) 10/27/2018  . Hyperlipidemia   . Internal hemorrhoids 2014   noted on colonoscopy  . Osteoarthritis of both knees   . Prostate cancer [facility])    prostate  . Sigmoid diverticulosis 2014   Mild, noted on colonoscopy    Past Surgical History:  Procedure Laterality Date  . CARPAL TUNNEL RELEASE Right 02/22/2019   Procedure: CARPAL TUNNEL RELEASE;   Surgeon: Carole Civil, MD;  Location: [facility];  Service: Orthopedics;  Laterality: Right;  . COLONOSCOPY N/A 09/13/2012   Procedure: COLONOSCOPY;  Surgeon: Danie Binder, MD;  [facility];  Service: Endoscopy;  Laterality: N/A;  9:30 AM  . CYSTOSCOPY WITH URETHRAL DILATATION N/A 06/02/2020   Procedure: CYSTOSCOPY WITH URETHRAL DILATATION;  Surgeon: [name], MD;  [facility];  Service: Urology;  Laterality: N/A;  ONLY NEEDS 30 MIN  . HERNIA REPAIR     20 years ago  . KNEE ARTHROSCOPY Left    15 years ago  . LYMPHADENECTOMY Bilateral 05/30/2017   Procedure: LYMPHADENECTOMY, PELVIC EXTENDED;  Surgeon: [name], MD;  [facility];  Service: Urology;  Laterality: Bilateral;  . ROBOT ASSISTED LAPAROSCOPIC RADICAL PROSTATECTOMY N/A 05/30/2017   Procedure: XI ROBOTIC ASSISTED LAPAROSCOPIC RADICAL PROSTATECTOMY LEVEL 3;  Surgeon: [name], MD;  [facility];  Service: Urology;  Laterality: N/A;  . SHOULDER ARTHROSCOPY Right    15 years ago  . TOTAL KNEE ARTHROPLASTY Left 03/21/2019   Procedure: LEFT TOTAL KNEE ARTHROPLASTY;  Surgeon: Mcarthur Rossetti, MD;  [facility];  Service: Orthopedics;  Laterality: Left;    Family History  Problem Relation Age of Onset  . Cancer Mother        small cell lung   Social History:  reports that he has never smoked. He has never used smokeless tobacco. He reports that he does not drink alcohol and does not use drugs.  Allergies: No Known Allergies  Medications Prior to Admission  Medication Sig Dispense Refill  . Omega-3 Fatty Acids (FISH OIL MAXIMUM STRENGTH) 1200 MG CAPS Take 1 capsule by mouth daily.    Marland Kitchen oxycodone (OXY-IR) 5 MG capsule Take 5 mg by mouth every 4 (four) hours as needed.    . diclofenac (VOLTAREN) 75 MG EC tablet TAKE 1 TABLET BY MOUTH 2 TIMES DAILY BETWEEN MEALS AS NEEDED. 60 tablet 3  . escitalopram (LEXAPRO) 10 MG tablet Take 10 mg by mouth daily.    Marland Kitchen glimepiride (AMARYL) 1 MG  tablet Take 1 mg by mouth daily.    Marland Kitchen icosapent Ethyl (VASCEPA) 1 g capsule Take 2 capsules (2 g total) by mouth 2 (two) times daily. (Patient taking differently: Take 2 g by mouth daily.) 120 capsule 3  . insulin aspart (NOVOLOG) 100 UNIT/ML injection Inject 5-10 Units into the skin 3 (three) times daily with meals.    Marland Kitchen losartan-hydrochlorothiazide (HYZAAR) 50-12.5 MG tablet TAKE 1 TABLET BY MOUTH EVERY DAY 90 tablet 1  . naproxen sodium (ALEVE) 220 MG tablet Take 220-440 mg by mouth daily as needed (pain).     Marland Kitchen oxybutynin (DITROPAN) 5 MG tablet Take 5 mg by mouth 3 (three) times daily.    . rosuvastatin (CRESTOR) 20 MG tablet TAKE 1 TABLET BY MOUTH EVERY DAY (Patient taking differently: Take 20 mg by mouth daily.) 90 tablet 1  . TRESIBA FLEXTOUCH 100 UNIT/ML SOPN FlexTouch Pen Inject 64 Units into the skin daily.      No results found for this or any previous visit (from the past 48 hour(s)). XR Knee 1-2 Views Left  Result Date: 08/13/2020 Left knee 2 views: No acute fracture.  Left total knee arthroplasty components appear well seated.  AP views quite oblique though.  No obvious signs of loosening.   Review of Systems  Constitutional: Positive for chills and fever.  Musculoskeletal: Positive for arthralgias and joint swelling.  All other systems reviewed and are negative.   Height 5\' 9"  (1.753 m), weight 86.2 kg. Physical Exam Vitals reviewed.  Constitutional:      Appearance: Normal appearance.  HENT:     Head: Normocephalic and atraumatic.  Eyes:     Extraocular Movements: Extraocular movements intact.     Pupils: Pupils are equal, round, and reactive to light.  Cardiovascular:     Rate and Rhythm: Normal rate and regular rhythm.     Pulses: Normal pulses.  Pulmonary:     Effort: Pulmonary effort is normal.  Abdominal:     Palpations: Abdomen is soft.  Musculoskeletal:     Cervical back: Normal range of motion and neck supple.     Left knee: Effusion, erythema and bony  tenderness present.  Neurological:     Mental Status: He is alert and oriented to person, place, and time.  Psychiatric:        Behavior: Behavior normal.      Assessment/Plan Acute left knee effusion with questionable prosthetic joint infection  Again, the plan is to proceed to surgery today for an open arthrotomy of the left knee, synovectomy, polyliner exchange and placement of antibiotic beads in an attempt to salvage the knee.  He will then be admitted for IV antibiotics and infectious disease consult.  Mcarthur Rossetti, MD 08/14/2020, 7:30 AM

## 2020-08-14 NOTE — Anesthesia Procedure Notes (Signed)
Spinal  Patient location during procedure: OR Start time: 08/14/2020 10:32 AM End time: 08/14/2020 10:37 AM Reason for block: surgical anesthesia Staffing Performed: anesthesiologist  Anesthesiologist: Roderic Palau, MD Preanesthetic Checklist Completed: patient identified, IV checked, risks and benefits discussed, surgical consent, monitors and equipment checked, pre-op evaluation and timeout performed Spinal Block Patient position: sitting Prep: DuraPrep Patient monitoring: cardiac monitor, continuous pulse ox and blood pressure Approach: midline Location: L3-4 Injection technique: single-shot Needle Needle type: Pencan  Needle gauge: 24 G Needle length: 9 cm Assessment Sensory level: T8 Events: CSF return Additional Notes Functioning IV was confirmed and monitors were applied. Sterile prep and drape, including hand hygiene and sterile gloves were used. The patient was positioned and the spine was prepped. The skin was anesthetized with lidocaine.  Free flow of clear CSF was obtained prior to injecting local anesthetic into the CSF.  The spinal needle aspirated freely following injection.  The needle was carefully withdrawn.  The patient tolerated the procedure well.

## 2020-08-14 NOTE — Progress Notes (Signed)
Inpatient Diabetes Program Recommendations  AACE/ADA: New Consensus Statement on Inpatient Glycemic Control (2015)  Target Ranges:  Prepandial:   less than 140 mg/dL      Peak postprandial:   less than 180 mg/dL (1-2 hours)      Critically ill patients:  140 - 180 mg/dL   Lab Results  Component Value Date   GLUCAP 196 (H) 08/14/2020   HGBA1C 9.1 (H) 05/19/2020    Review of Glycemic Control  Diabetes history: DM2 Outpatient Diabetes medications: Tresiba 64 units QD, Novolog 8 units TID with meals Current orders for Inpatient glycemic control: Lantus 64 units QD (to start 5/27 am), Novolog 0-15 units tid with meals and 0-5 HS   HgbA1C - 9.1% - needs tighter control  Received Decadron 2 mg intraoperatively. Pt gave himself 1/2 home dose of Tresiba (32 units) this morning.  Inpatient Diabetes Program Recommendations:    Add Novolog 4 units TID with meals if eating > 50% meal  Continue to follow.  Thank you. Lorenda Peck, RD, LDN, CDE Inpatient Diabetes Coordinator [phone]

## 2020-08-14 NOTE — Anesthesia Procedure Notes (Signed)
Procedure Name: MAC Date/Time: 08/14/2020 10:32 AM Performed by: Janene Harvey, CRNA Pre-anesthesia Checklist: Patient identified, Emergency Drugs available, Suction available and Patient being monitored Patient Re-evaluated:Patient Re-evaluated prior to induction Oxygen Delivery Method: Simple face mask Induction Type: IV induction Placement Confirmation: positive ETCO2 Dental Injury: Teeth and Oropharynx as per pre-operative assessment

## 2020-08-15 ENCOUNTER — Inpatient Hospital Stay: Payer: Self-pay

## 2020-08-15 ENCOUNTER — Telehealth: Payer: Self-pay

## 2020-08-15 DIAGNOSIS — T8454XA Infection and inflammatory reaction due to internal left knee prosthesis, initial encounter: Principal | ICD-10-CM

## 2020-08-15 DIAGNOSIS — A4901 Methicillin susceptible Staphylococcus aureus infection, unspecified site: Secondary | ICD-10-CM

## 2020-08-15 DIAGNOSIS — E119 Type 2 diabetes mellitus without complications: Secondary | ICD-10-CM

## 2020-08-15 DIAGNOSIS — I890 Lymphedema, not elsewhere classified: Secondary | ICD-10-CM

## 2020-08-15 DIAGNOSIS — M25462 Effusion, left knee: Secondary | ICD-10-CM | POA: Diagnosis not present

## 2020-08-15 DIAGNOSIS — C61 Malignant neoplasm of prostate: Secondary | ICD-10-CM

## 2020-08-15 LAB — BASIC METABOLIC PANEL
Anion gap: 5 (ref 5–15)
BUN: 24 mg/dL — ABNORMAL HIGH (ref 8–23)
CO2: 30 mmol/L (ref 22–32)
Calcium: 8.3 mg/dL — ABNORMAL LOW (ref 8.9–10.3)
Chloride: 101 mmol/L (ref 98–111)
Creatinine, Ser: 1.4 mg/dL — ABNORMAL HIGH (ref 0.61–1.24)
GFR, Estimated: 56 mL/min — ABNORMAL LOW (ref >60)
Glucose, Bld: 133 mg/dL — ABNORMAL HIGH (ref 70–99)
Potassium: 4.1 mmol/L (ref 3.5–5.1)
Sodium: 136 mmol/L (ref 135–145)

## 2020-08-15 LAB — CK: Total CK: 179 U/L (ref 49–397)

## 2020-08-15 LAB — GLUCOSE, CAPILLARY
Glucose-Capillary: 103 mg/dL — ABNORMAL HIGH (ref 70–99)
Glucose-Capillary: 106 mg/dL — ABNORMAL HIGH (ref 70–99)
Glucose-Capillary: 132 mg/dL — ABNORMAL HIGH (ref 70–99)
Glucose-Capillary: 159 mg/dL — ABNORMAL HIGH (ref 70–99)
Glucose-Capillary: 172 mg/dL — ABNORMAL HIGH (ref 70–99)

## 2020-08-15 LAB — C-REACTIVE PROTEIN: CRP: 27.9 mg/dL — ABNORMAL HIGH (ref <1.0)

## 2020-08-15 LAB — SEDIMENTATION RATE: Sed Rate: 58 mm/hr — ABNORMAL HIGH (ref 0–16)

## 2020-08-15 LAB — CBC
HCT: 34 % — ABNORMAL LOW (ref 39.0–52.0)
Hemoglobin: 11.5 g/dL — ABNORMAL LOW (ref 13.0–17.0)
MCH: 30.2 pg (ref 26.0–34.0)
MCHC: 33.8 g/dL (ref 30.0–36.0)
MCV: 89.2 fL (ref 80.0–100.0)
Platelets: 95 10*3/uL — ABNORMAL LOW (ref 150–400)
RBC: 3.81 MIL/uL — ABNORMAL LOW (ref 4.22–5.81)
RDW: 13.2 % (ref 11.5–15.5)
WBC: 7.3 10*3/uL (ref 4.0–10.5)
nRBC: 0 % (ref 0.0–0.2)

## 2020-08-15 MED ORDER — CHLORHEXIDINE GLUCONATE CLOTH 2 % EX PADS: 6.0000 | MEDICATED_PAD | Freq: Every day | CUTANEOUS | Status: DC

## 2020-08-15 MED ORDER — SODIUM CHLORIDE 0.9% FLUSH
10.0000 mL | INTRAVENOUS | Status: DC | PRN
Start: 2020-08-15 — End: 2020-08-18

## 2020-08-15 MED ORDER — SODIUM CHLORIDE 0.9 % IV SOLN
8.0000 mg/kg | Freq: Every day | INTRAVENOUS | Status: DC
  Administered 2020-08-15: 700 mg via INTRAVENOUS
  Filled 2020-08-15 (×2): qty 14

## 2020-08-15 NOTE — Telephone Encounter (Signed)
[facility] wanted to let Rosanna Randy know about a critical lab result for patient.  Please advise.  Thank you

## 2020-08-15 NOTE — Progress Notes (Signed)
Patient ID: [name], male   DOB: August 31, 1955, 65 y.o.   MRN: [id number] The patient does look better and feel better today.  The gram stain from his left knee does show gram-positive cocci.  His peripheral white blood cell count is now down to 7000 from 13,000.  His CRP and sed rate are elevated.  Today we will order a PICC line for long-term IV antibiotics and will consult the infectious disease service for their assessment of this infection of the patient's left total knee arthroplasty.

## 2020-08-15 NOTE — Plan of Care (Signed)

## 2020-08-15 NOTE — TOC Progression Note (Signed)
Transition of Care [facility]) - Progression Note    Patient Details  Name: [name] MRN: [id number] Date of Birth: 1956/03/19  Transition of Care [facility]) CM/SW Contact  Milinda Antis, LCSWA Phone Number: 08/15/2020, 3:57 PM  Clinical Narrative:    CSW spoke with [facility]. With [facility] [phone].  The agency is willing to provide infusions and HHRN.    Will follow-up as patient is medically stable.           Expected Discharge Plan and Services                                                 Social Determinants of Health (SDOH) Interventions    Readmission Risk Interventions No flowsheet data found.

## 2020-08-15 NOTE — Telephone Encounter (Signed)
Please review

## 2020-08-15 NOTE — Consult Note (Addendum)
Date of Admission:  08/14/2020          Reason for Consult: Prosthetic joint infection with Staphylococcus aureus    Referring Provider: Dr. Ninfa Linden   Assessment:  1. Left prosthetic joint infection with Staph aureus status post I&D and polyexchange 2. History of prostate cancer status post radical prostatectomy and extensive lymph node dissection bilaterally his pelvis with consequent longstanding left-sided lymphedema 3. Recent bladder neck stenosis status post cystoscopy and balloon dilation by urology 4. Type 2 diabetes mellitus 5. Acute kidney injury likely from his PJI  Plan:  1. Changed to daptomycin for now and await susceptibility testing 2. Would plan on giving him 6 weeks of parenteral antibiotics followed by oral antibiotics likely to complete a year of therapy  Dr. Baxter Flattery will be available this weekend to check on his cultures and adjust his antibiotics.  I will be back on Tuesday.  I have made him a follow-up appoint with me at [facility].     [name] has an appointment on 09/03/2020 at 945 with Dr. Tommy Medal at:   The [facility] is located in the [facility] at  [address] in [place].  [address], which is located to the left of the elevators.  Phone: [phone]  Fax: [phone]  [email or web address]  He should arrive 15 to 30 minutes prior to his appointment.  Principal Problem:   Effusion, left knee Active Problems:   Infection of total left knee replacement (HCC)   Scheduled Meds: . docusate sodium  100 mg Oral BID  . escitalopram  10 mg Oral Daily  . losartan  50 mg Oral Daily   And  . hydrochlorothiazide  12.5 mg Oral Daily  . insulin aspart  0-15 Units Subcutaneous TID WC  . insulin aspart  0-5 Units Subcutaneous QHS  . insulin glargine  64 Units Subcutaneous Daily  . pantoprazole  40 mg Oral Daily  . rosuvastatin  20 mg Oral Daily   Continuous  Infusions: . sodium chloride 75 mL/hr at 08/14/20 1610  . DAPTOmycin (CUBICIN)  IV    . methocarbamol (ROBAXIN) IV     PRN Meds:.acetaminophen, diphenhydrAMINE, HYDROmorphone (DILAUDID) injection, methocarbamol **OR** methocarbamol (ROBAXIN) IV, metoCLOPramide **OR** metoCLOPramide (REGLAN) injection, ondansetron **OR** ondansetron (ZOFRAN) IV, oxyCODONE, oxyCODONE  HPI: [name] is a 65 y.o. male history of multiple medical problems including diabetes mellitus osteoarthritis who had a right knee replacement performed by Dr. Ninfa Linden in December 2020.  He has had multiple surgeries from urology including radical prostatectomy and bilateral lymph node excision, with the latter surgery leaving him with chronic lymphedema changes in his left leg.  He has apparently been treated with various antibiotics over the last couple months and had been systemically ill with a concern for sepsis at 1 point in time.  Past week he had fairly acute onset of pain in his knee along with erythema that spread proximally and distally.  He had noticed an area that he described as looking like "ringworm on the top of his knee prior to this developing.  The knee became so excruciatingly painful that he was not able to move anymore.  He was seen by Dr. Ninfa Linden who aspirated amount of thick dark fluid from the knee.  Cultures have subsequently grown Staph aureus with sensitivities pending.  Patient has had an open arthrotomy with irrigation and debridement of the left knee with synovectomy and exchange of polyliner.  Give  switch the patient over to daptomycin in case this is a methicillin-resistant Staph aureus and investigating what daptomycin would cost if he were to go home on it versus vancomycin.  Hopefully he grows methicillin sensitive Staph aureus and we can treat him with cefazolin.  He will need 6 weeks of IV antibiotics followed by chronic oral antibiotics to get through at least a year with close  monitoring of his pain in his inflammatory markers as well as his exam.  I spent greater than 75minutes with the patient including greater than 50% of time in face to face counsel of the patient guarding the nature of prosthetic joint infections and the organism that is been isolated from his joint, reading his radiographs personally reviewing his pertinent laboratory microbiological data and in coordination of his care..    Review of Systems: Review of Systems  Constitutional: Positive for malaise/fatigue. Negative for chills, fever and weight loss.  HENT: Negative for congestion and sore throat.   Eyes: Negative for blurred vision and photophobia.  Respiratory: Negative for cough, shortness of breath and wheezing.   Cardiovascular: Negative for chest pain, palpitations and leg swelling.  Gastrointestinal: Negative for abdominal pain, blood in stool, constipation, diarrhea, heartburn, melena, nausea and vomiting.  Genitourinary: Negative for dysuria, flank pain and hematuria.  Musculoskeletal: Positive for joint pain and myalgias. Negative for back pain and falls.  Skin: Negative for itching and rash.  Neurological: Negative for dizziness, focal weakness, loss of consciousness, weakness and headaches.  Endo/Heme/Allergies: Does not bruise/bleed easily.  Psychiatric/Behavioral: Negative for depression and suicidal ideas. The patient does not have insomnia.     Past Medical History:  Diagnosis Date  . Arthritis   . Depression   . Diabetes mellitus without complication ([place]) 8938   Diabetes Type II  . GERD (gastroesophageal reflux disease)    diet controlled  . HTN (hypertension) 10/27/2018  . Hyperlipidemia   . Internal hemorrhoids 2014   noted on colonoscopy  . Osteoarthritis of both knees   . Prostate cancer [facility])    prostate  . Sigmoid diverticulosis 2014   Mild, noted on colonoscopy    Social History   Tobacco Use  . Smoking status: Never Smoker  . Smokeless tobacco:  Never Used  Vaping Use  . Vaping Use: Never used  Substance Use Topics  . Alcohol use: No  . Drug use: No    Family History  Problem Relation Age of Onset  . Cancer Mother        small cell lung   No Known Allergies  OBJECTIVE: Blood pressure 118/62, pulse 71, temperature 97.8 F (36.6 C), temperature source Oral, resp. rate 18, height 5\' 9"  (1.753 m), weight 86.2 kg, SpO2 97 %.  Physical Exam Constitutional:      Appearance: He is well-developed.  HENT:     Head: Normocephalic and atraumatic.  Eyes:     Conjunctiva/sclera: Conjunctivae normal.  Cardiovascular:     Rate and Rhythm: Normal rate and regular rhythm.     Heart sounds: No murmur heard. No gallop.   Pulmonary:     Effort: Pulmonary effort is normal. No respiratory distress.     Breath sounds: No wheezing or rhonchi.  Abdominal:     General: Abdomen is flat. There is no distension.     Palpations: Abdomen is soft.  Musculoskeletal:     Cervical back: Normal range of motion and neck supple.  Skin:    General: Skin is warm and dry.  Coloration: Skin is not pale.     Findings: No erythema or rash.  Neurological:     General: No focal deficit present.     Mental Status: He is alert and oriented to person, place, and time.  Psychiatric:        Mood and Affect: Mood normal.        Behavior: Behavior normal.        Thought Content: Thought content normal.        Judgment: Judgment normal.    Left leg with extensive edema and some venous stasis changes as well as erythema that seems related to his prosthetic knee infection knee is bandaged  Lab Results Lab Results  Component Value Date   WBC 7.3 08/15/2020   HGB 11.5 (L) 08/15/2020   HCT 34.0 (L) 08/15/2020   MCV 89.2 08/15/2020   PLT 95 (L) 08/15/2020    Lab Results  Component Value Date   CREATININE 1.40 (H) 08/15/2020   BUN 24 (H) 08/15/2020   NA 136 08/15/2020   K 4.1 08/15/2020   CL 101 08/15/2020   CO2 30 08/15/2020   No results found  for: ALT, AST, GGT, ALKPHOS, BILITOT   Microbiology: Recent Results (from the past 240 hour(s))  Aerobic/Anaerobic Culture w Gram Stain (surgical/deep wound)     Status: None (Preliminary result)   Collection Time: 08/14/20 10:56 AM   Specimen: Soft Tissue, Other  Result Value Ref Range Status   Specimen Description FLUID  Final   Special Requests LEFT KNEE FLUID  Final   Gram Stain   Final    FEW WBC PRESENT, PREDOMINANTLY PMN RARE GRAM POSITIVE COCCI    Culture   Final    FEW STAPHYLOCOCCUS AUREUS SUSCEPTIBILITIES TO FOLLOW Performed at [facility], [address] [place], [place] [address]    Report Status PENDING  Incomplete    Alcide Evener, [facility] [phone] pager  08/15/2020, 12:04 PM

## 2020-08-15 NOTE — Plan of Care (Signed)
  Problem: Education: Goal: Knowledge of General Education information will improve Description Including pain rating scale, medication(s)/side effects and non-pharmacologic comfort measures Outcome: Progressing   

## 2020-08-15 NOTE — Evaluation (Signed)
Physical Therapy Evaluation Patient Details Name: [name] MRN: [id number] DOB: 10-13-1955 Today's Date: 08/15/2020   History of Present Illness  Pt is 65 yo male admitted w L knee joint effusion and questionable prosthetic infection on 08/14/20. He is s/p I and D with synovectomy all 3 compartments and poly liner exchange on 08/14/20 and plan for PICC. Pt with PMH including arthritis, DM, depression, HTN, L TKA 02/2019, recent urologic issues, prostate CA s/p sx with several lymph nodes removed.  Clinical Impression   Pt admitted with above diagnosis. Pt making excellent progress for POD #1.  He had good pain control and moved well. Pt has had prior surgeries and had good instincts on transfer techniques (using R LE to assist) and crutch use.  He ambulated 250' with crutches safely and performed stairs.  Pt has DME at home and support from wife.  Pt demonstrates safe gait & transfers in order to return home from PT perspective once discharged by MD, also to ambulate safely with supervision from family while hospitalized.  While in hospital, will continue to benefit from PT for skilled therapy to advance mobility and exercises.  Pt currently with functional limitations due to the deficits listed below (see PT Problem List). Pt will benefit from skilled PT to increase their independence and safety with mobility to allow discharge to the venue listed below.       Follow Up Recommendations Outpatient PT;Supervision - Intermittent    Equipment Recommendations  None recommended by PT    Recommendations for Other Services       Precautions / Restrictions Precautions Precautions: Fall Precaution Comments: incontinent of urine Restrictions LLE Weight Bearing: Weight bearing as tolerated      Mobility  Bed Mobility Overal bed mobility: Modified Independent             General bed mobility comments: using R LE to assist L without cues    Transfers Overall transfer level: Needs  assistance Equipment used: Rolling walker (2 wheeled);Crutches Transfers: Sit to/from Stand Sit to Stand: Min guard;Supervision         General transfer comment: min guard progressed to supervision; performed x 3  Ambulation/Gait Ambulation/Gait assistance: Min guard;Supervision Gait Distance (Feet): 250 Feet Assistive device: Rolling walker (2 wheeled);Crutches   Gait velocity: normal   General Gait Details: RW progressed to crutches; pt putting minimal weight into crutches; progressed from step to pattern to step through; does keep L LE straight with ambulation; steady - no LOB  Stairs Stairs: Yes Stairs assistance: Min guard Stair Management: Step to pattern;With crutches Number of Stairs: 5 General stair comments: performed without cues or difficulty  Wheelchair Mobility    Modified Rankin (Stroke Patients Only)       Balance Overall balance assessment: Needs assistance   Sitting balance-Leahy Scale: Normal       Standing balance-Leahy Scale: Good Standing balance comment: Used crutches to ambulate but could easily manipulate crutches to switch from RW to crutches and standing statically without AD                             Pertinent Vitals/Pain Pain Assessment: 0-10 Pain Score: 2  Pain Location: L knee Pain Descriptors / Indicators: Dull Pain Intervention(s): Limited activity within patient's tolerance;Monitored during session;Ice applied;Premedicated before session;Repositioned    Home Living Family/patient expects to be discharged to:: Private residence Living Arrangements: Spouse/significant other Available Help at Discharge: Family;Available 24 hours/day Type of  Home: House Home Access: Stairs to enter   [employer] of Steps: patient has 4 steps in from garage and 3 at front, no rails (6+ stairs at back with hand rails) Home Layout: One level;Other (Comment) (Has garage with office over but can avoid) Home Equipment: Shower  seat;Walker - 2 wheels;Cane - single point;Crutches      Prior Function Level of Independence: Independent         Comments: Very independent and active. Works full time as Surveyor, mining        Extremity/Trunk Assessment   Upper Extremity Assessment Upper Extremity Assessment: Overall WFL for tasks assessed    Lower Extremity Assessment Lower Extremity Assessment: LLE deficits/detail LLE Deficits / Details: L LE with significant edema - pt reports had lymph nodes removed and between that, surgery, infection edema increased.  ROM: ankle and hip WNL, knee 0 to 30 degrees; MMT: ankle 5/5, knee 3/5, hip 3/5    Cervical / Trunk Assessment Cervical / Trunk Assessment: Normal  Communication   Communication: No difficulties  Cognition Arousal/Alertness: Awake/alert Behavior During Therapy: Impulsive (impulsive/eager to move at times) Overall Cognitive Status: Within Functional Limits for tasks assessed                                        General Comments General comments (skin integrity, edema, etc.): Educated on PT role and POC.  Encouraged ROM exercises as able.  Also, encourage ambulation at least 3 x day with supervision from nursing staff or wife - pt eager to do this.    Exercises General Exercises - Lower Extremity Ankle Circles/Pumps: AROM;Both;10 reps;Supine Long Arc Quad: AROM;Left;10 reps;Seated Heel Slides: AROM;Left;10 reps;Seated   Assessment/Plan    PT Assessment Patient needs continued PT services  PT Problem List Decreased strength;Decreased mobility;Decreased range of motion;Decreased activity tolerance;Decreased balance;Decreased knowledge of use of DME       PT Treatment Interventions DME instruction;Therapeutic activities;Gait training;Therapeutic exercise;Patient/family education;Stair training;Balance training;Functional mobility training    PT Goals (Current goals can be found in the Care Plan section)  Acute  Rehab PT Goals Patient Stated Goal: take a shower PT Goal Formulation: With patient/family Time For Goal Achievement: 08/29/20 Potential to Achieve Goals: Good Additional Goals Additional Goal #1: L knee will achieve 0 to 90 degrees ROM for gait and stairs    Frequency Min 5X/week   Barriers to discharge        Co-evaluation               AM-PAC PT "6 Clicks" Mobility  Outcome Measure Help needed turning from your back to your side while in a flat bed without using bedrails?: None Help needed moving from lying on your back to sitting on the side of a flat bed without using bedrails?: None Help needed moving to and from a bed to a chair (including a wheelchair)?: A Little Help needed standing up from a chair using your arms (e.g., wheelchair or bedside chair)?: A Little Help needed to walk in hospital room?: A Little Help needed climbing 3-5 steps with a railing? : A Little 6 Click Score: 20    End of Session Equipment Utilized During Treatment: Gait belt Activity Tolerance: Patient tolerated treatment well Patient left: with chair alarm set;in chair;with call bell/phone within reach;with family/visitor present Nurse Communication: Mobility status PT Visit Diagnosis: Other abnormalities of gait and  mobility (R26.89);Muscle weakness (generalized) (M62.81)    Time: 1120-1203 PT Time Calculation (min) (ACUTE ONLY): 43 min   Charges:   PT Evaluation $PT Eval Low Complexity: 1 Low PT Treatments $Gait Training: 8-22 mins $Therapeutic Exercise: 8-22 mins        Abran Richard, PT Acute Rehab Services Pager [phone] [facility] [place] 08/15/2020, 12:19 PM

## 2020-08-15 NOTE — Op Note (Signed)
[name], [name] MEDICAL RECORD NO: [id number] ACCOUNT NO: [id number] DATE OF BIRTH: [birth year] FACILITY: [facility] LOCATION: [facility] PHYSICIAN: Lind Guest. Ninfa Linden, MD  Operative Report   DATE OF PROCEDURE: 08/14/2020  PREOPERATIVE DIAGNOSIS:  Left knee effusion with suspected prosthetic joint infection.  POSTOPERATIVE DIAGNOSIS:  Left knee effusion with suspected prosthetic joint infection.  PROCEDURES:    1.  Irrigation and debridement, left knee, with complete synovectomy of all three compartments. 2.  Poly liner exchange, left knee.  IMPLANT:  Stryker Triathlon fixed-bearing polyethylene insert, size 12 for a size 4 tibia.  FINDINGS:  A large amount of dark fluid within the left knee joint consistent with purulence with stat Gram stain and cultures pending.  SURGEON:  Lind Guest. Ninfa Linden, MD  ASSISTANT:  Benita Stabile, PA-C  ANESTHESIA: 1.  Adductor canal block, left lower extremity. 2.  Spinal.  BLOOD LOSS:  Less than 100 mL.  ANTIBIOTICS:  Vancomycin IV and Ancef IV after cultures obtained.  COMPLICATIONS:  None.  INDICATIONS:  The patient is a 65 year old gentleman who underwent a left primary total knee arthroplasty in 02/2019.  He had never had problems with that knee postoperatively.  Unfortunately, he did develop prostate cancer and had a radical  prostatectomy.  He has had significant issues with his bladder since then.  Even recently as much as a month ago, he has been having fever and chills and rigors.  He has been off and on antibiotics per the urologist.  This has happened several times.  He  is also a poorly controlled diabetic and I saw his hemoglobin A1c was 9.2 in February.  His blood sugars have also been running high. This week, he was working in the garden and his knee started to swell.  I saw him the very next day, which was  yesterday, and drained a large amount of fluid off of his knee that was consistent with infection.  We sent it off for Gram  stain, culture, and cell count.  That has not come back yet, but I felt it was prudent to set him up today for an open synovectomy  of that left knee with poly exchange.  He will then be admitted for IV antibiotics as well.  I am certainly concerned about his blood glucose running high.  His peripheral white blood cell count is over 13,000.  His hemoglobin and hematocrit are stable.   His creatinine is also elevated.  I had a long and thorough discussion with him about the risks and benefits of surgery.  The hope is that we can retain this joint, but he understands that at some point he may have to have the entire joint removed.  DESCRIPTION OF PROCEDURE:  After informed consent was obtained, appropriate left knee was marked.  An adductor canal block was obtained in the holding room of his left lower extremity.  He was brought to the operating room and sat up on a routine  operating table where spinal anesthesia was obtained. He was then laid in supine position on the operating table.  A nonsterile tourniquet was placed around his upper left thigh, and his left thigh, knee, leg, ankle and foot were prepped and draped with  DuraPrep and sterile drapes.  A timeout was called.  He was identified as correct patient, correct left knee.  We then elevated the leg, had the tourniquet inflated to 300 mm of pressure.  I made a direct midline incision over his previous incision,  carried this proximally  and distally.  We dissected down the knee joint and carried out a medial parapatellar arthrotomy finding a very large effusion.  We sent off stat Gram stain and culture and then gave him his IV vancomycin and IV Ancef.  Once we  got the knee opened completely, I performed a complete synovectomy using a scalpel and electrocautery removing synovium from the medial and lateral compartments as well as the suprapatellar compartment and around the patella itself.  We then irrigated  the knee with 3 liters of normal saline  solution using pulsatile lavage.  We then passed all instrumentation on the table that we had used and changed our gloves.  We then removed the previous poly and was able to get to the back of the knee.  I  performed a synovectomy in the posterior compartment of the knee as well and used electrocautery to cauterize as much as we could around the entire knee in terms of all the synovium that we could.  We then irrigated an additional 3 liters of normal  saline solution throughout the knee for a total of 6 liters of normal saline solution.  We then dried the knee real well and placed our new polyethylene insert, which was 12 mm thickness for a size 4 tibial tray.  We then let the tourniquet down.   Hemostasis was obtained with electrocautery.  I then placed vancomycin powder in both the medial and lateral gutters of the knee and in the suprapatellar area.  We then closed the arthrotomy with interrupted #1 Vicryl suture followed by 0 Vicryl to close  deep tissue and 2-0 Vicryl to close subcutaneous tissue.  The skin was reapproximated with staples.  Xeroform well-padded sterile dressing was applied.  He was taken to recovery room in stable condition with all final counts being correct.  No  complications noted.  Note, Benita Stabile, PA-C, assisted in the entire case and assistance was crucial for facilitating every aspect of this case.  Also, of note, postoperatively, he will be admitted for inpatient admission.   [facility] D: 08/14/2020 11:41:41 am T: 08/14/2020 3:53:00 pm  JOB: [id number]/ [id number]

## 2020-08-15 NOTE — Progress Notes (Signed)
Peripherally Inserted Central Catheter Placement  The IV Nurse has discussed with the patient and/or persons authorized to consent for the patient, the purpose of this procedure and the potential benefits and risks involved with this procedure.  The benefits include less needle sticks, lab draws from the catheter, and the patient may be discharged home with the catheter. Risks include, but not limited to, infection, bleeding, blood clot (thrombus formation), and puncture of an artery; nerve damage and irregular heartbeat and possibility to perform a PICC exchange if needed/ordered by physician.  Alternatives to this procedure were also discussed.  Bard Power PICC patient education guide, fact sheet on infection prevention and patient information card has been provided to patient /or left at bedside.    PICC Placement Documentation  PICC Single Lumen 08/15/20 Right Brachial 40 cm 0 cm (Active)  Indication for Insertion or Continuance of Line Home intravenous therapies (PICC only) 08/15/20 1518  Exposed Catheter (cm) 0 cm 08/15/20 1518  Site Assessment Clean;Dry;Intact 08/15/20 1518  Line Status Flushed;Blood return noted;Saline locked 08/15/20 1518  Dressing Type Transparent 08/15/20 1518  Dressing Status Clean;Dry;Intact 08/15/20 1518  Antimicrobial disc in place? Yes 08/15/20 1518  Dressing Change Due 08/22/20 08/15/20 1518       Scotty Court 08/15/2020, 3:20 PM

## 2020-08-16 LAB — BASIC METABOLIC PANEL
Anion gap: 12 (ref 5–15)
BUN: 19 mg/dL (ref 8–23)
CO2: 22 mmol/L (ref 22–32)
Calcium: 8.1 mg/dL — ABNORMAL LOW (ref 8.9–10.3)
Chloride: 99 mmol/L (ref 98–111)
Creatinine, Ser: 1.05 mg/dL (ref 0.61–1.24)
GFR, Estimated: >60 mL/min (ref >60)
Glucose, Bld: 165 mg/dL — ABNORMAL HIGH (ref 70–99)
Potassium: 3.9 mmol/L (ref 3.5–5.1)
Sodium: 133 mmol/L — ABNORMAL LOW (ref 135–145)

## 2020-08-16 LAB — C-REACTIVE PROTEIN: CRP: 26 mg/dL — ABNORMAL HIGH (ref <1.0)

## 2020-08-16 LAB — GLUCOSE, CAPILLARY
Glucose-Capillary: 176 mg/dL — ABNORMAL HIGH (ref 70–99)
Glucose-Capillary: 158 mg/dL — ABNORMAL HIGH (ref 70–99)
Glucose-Capillary: 162 mg/dL — ABNORMAL HIGH (ref 70–99)
Glucose-Capillary: 162 mg/dL — ABNORMAL HIGH (ref 70–99)

## 2020-08-16 MED ORDER — SODIUM CHLORIDE 0.9 % IV SOLN
8.0000 mg/kg | Freq: Every day | INTRAVENOUS | Status: DC
  Administered 2020-08-16: 700 mg via INTRAVENOUS
  Filled 2020-08-16: qty 14

## 2020-08-16 MED ORDER — CEFAZOLIN SODIUM-DEXTROSE 2-4 GM/100ML-% IV SOLN
2.0000 g | Freq: Three times a day (TID) | INTRAVENOUS | Status: DC
  Administered 2020-08-16 – 2020-08-18 (×5): 2 g via INTRAVENOUS
  Filled 2020-08-16 (×6): qty 100

## 2020-08-16 MED ORDER — SODIUM CHLORIDE 0.9 % IV SOLN: 8.0000 mg/kg | Freq: Every day | INTRAVENOUS | Status: DC

## 2020-08-16 NOTE — Progress Notes (Signed)
PHARMACY CONSULT NOTE FOR:  OUTPATIENT  PARENTERAL ANTIBIOTIC THERAPY (OPAT)  Indication: prosthetic joint infection Regimen: cefazolin 2g IV every 8 hours  End date: 09/26/2020  IV antibiotic discharge orders are pended. To discharging provider:  please sign these orders via discharge navigator,  Select New Orders & click on the button choice - Manage This Unsigned Work.     Thank you for allowing pharmacy to be a part of this patient's care.  Antonietta Jewel, PharmD, [facility] Clinical Pharmacist  Phone: [phone] 08/16/2020 7:06 PM  Please check AMION for all [facility] phone numbers After 10:00 PM, call [facility] [place] [phone]

## 2020-08-16 NOTE — Progress Notes (Addendum)
ID PROGRESS NOTE   65yo M with PJI with staph aureus (sensitivity pending) with picc line in place  - continue with daptomycin for today only, then will discharge on cefazolin 2gm IV Q 8hr for home health - he has already received/tolerated cefazolin during this hospitalization - opat orders in epic  --------------------- Addendum: will change cefazolin 2gm IV Q 8hr   For home health orders listed below ----------------------- Diagnosis: Prosthetic joint infection  Culture Result: staphylococcus aureus (MSSA)  No Known Allergies  OPAT Orders Discharge antibiotics to be given via PICC line Discharge antibiotics: Cefazolin 2gm IV Q 8hr Duration: 6 wk End Date: July 8th 2022  [facility] Care Per Protocol:  Home health RN for IV administration and teaching; PICC line care and labs.    Labs weekly while on IV antibiotics: _x_ CBC with differential _x_ BMP  _x_ CRP _x ESR    _x_ Please pull PIC at completion of IV antibiotics   Fax weekly labs to [phone]  Clinic Follow Up Appt: In 4 wk with dr Lucianne Lei dam  @    [facility] [place] [phone]

## 2020-08-16 NOTE — Progress Notes (Signed)
Pharmacy Antibiotic Note  [name] is a 65 y.o. male admitted on 08/14/2020 with prosthetic joint infection.  Pharmacy has been consulted for cefazolin dosing.  Found to have prosthetic joint infection with staph aureus s/p I&D and polyexchange. L knee fluid cx came back as MSSA - plan per ID to switch to cefazolin 2g IV at discharge. Scr 1.05 (CrCl 77 mL/min). WBC 7.3, CRP 26, afebrile.   Plan: Cefazolin 2g IV every 8 hours  Monitor renal fx, cx results, clinical pic  Height: 5\' 9"  (175.3 cm) Weight: 86.2 kg (190 lb) IBW/kg (Calculated) : 70.7  Temp (24hrs), Avg:98.4 F (36.9 C), Min:98.1 F (36.7 C), Max:98.7 F (37.1 C)  Recent Labs  Lab 08/14/20 0742 08/15/20 0048 08/16/20 0104  WBC 13.0* 7.3  --   CREATININE 1.45* 1.40* 1.05    Estimated Creatinine Clearance: 77.3 mL/min (by C-G formula based on SCr of 1.05 mg/dL).    No Known Allergies  Antimicrobials this admission: Cefazolin 5/26, 5/28 >>  Vancomycin 5/26 x1 Daptomycin 5/27>> 5/28  Dose adjustments this admission: N/A  Microbiology results: 5/26 L Knee Fluid Cx: MSSA  Thank you for allowing pharmacy to be a part of this patient's care.  Antonietta Jewel, PharmD, [place] Clinical Pharmacist  Phone: [phone] 08/16/2020 7:03 PM  Please check AMION for all [place] phone numbers After 10:00 PM, call [place] [phone]

## 2020-08-16 NOTE — Progress Notes (Signed)
PHARMACY CONSULT NOTE FOR:  OUTPATIENT  PARENTERAL ANTIBIOTIC THERAPY (OPAT)  Indication: prosthetic joint infection Regimen: daptomycin 700 mg IV q24h End date: 09/26/2020  IV antibiotic discharge orders are pended. To discharging provider:  please sign these orders via discharge navigator,  Select New Orders & click on the button choice - Manage This Unsigned Work.     Thank you for allowing pharmacy to be a part of this patient's care.  Berenice Bouton, PharmD, MS, BCPS PGY2 Pharmacy Resident Phone between 7 am - 3:30 pm: [phone]  Please check AMION for all [place] phone numbers After 10:00 PM, call [place] [phone]  08/16/2020, 2:23 PM

## 2020-08-16 NOTE — TOC Initial Note (Addendum)
Transition of Care [facility]) - Initial/Assessment Note    Patient Details  Name: [name] MRN: [id number] Date of Birth: Feb 06, 1956  Transition of Care [facility]) CM/SW Contact:    Bartholomew Crews, RN Phone Number: [phone] 08/16/2020, 10:40 AM  Clinical Narrative:      Correction:  Referral for IV antibiotics sent to [facility] Spoke with Ginger at [employer] - no insurance authorization submitted at this time. Will continue plan for DC on Tuesday following dose of IV antibiotics and HH to start care on Wednesday.        Notified by Jeannene Patella at [facility] of patient needs for IV antibiotics. Prior authorization for antibiotics required by [facility], but [place] will not reopen until Tuesday. Will plan for Tuesday's dose to be given at hospital, and home health to follow up with patient on Wednesday for start of care. [facility] RN referral for infusion antibiotics is pending with [facility] who is currently the only agency who accepts [place] at this time. TOC following for transition needs.     Expected Discharge Plan: [place] Barriers to Discharge: Insurance Authorization   Patient Goals and CMS Choice        Expected Discharge Plan and Services Expected Discharge Plan: [place]   Discharge Planning Services: CM Consult Post Acute Care Choice: [place] arrangements for the past 2 months: Single Family Home                           HH Arranged: RN [place] Agency: [place] Date [place]: 08/16/20 Time [place]: 1039 Representative spoke with at [place]: pending referral with [facility]  Prior Living Arrangements/Services Living arrangements for the past 2 months: [place] with:: Self,Spouse                   Activities of Daily Living Home Assistive Devices/Equipment: Crutches,CBG Meter,Blood pressure cuff,Electric scooter ADL Screening (condition at time of  admission) Patient's cognitive ability adequate to safely complete daily activities?: Yes Is the patient deaf or have difficulty hearing?: No Does the patient have difficulty seeing, even when wearing glasses/contacts?: No Does the patient have difficulty concentrating, remembering, or making decisions?: No Patient able to express need for assistance with ADLs?: No Does the patient have difficulty dressing or bathing?: No Independently performs ADLs?: Yes (appropriate for developmental age) Does the patient have difficulty walking or climbing stairs?: No Weakness of Legs: Left Weakness of Arms/Hands: None  Permission Sought/Granted                  Emotional Assessment              Admission diagnosis:  Infection of total left knee replacement [facility]) [T84.54XA] Patient Active Problem List   Diagnosis Date Noted  . Effusion, left knee 08/14/2020  . Infection of total left knee replacement ([place]) 08/14/2020  . Status post total left knee replacement 03/21/2019  . S/P carpal tunnel release right 02/22/19 03/08/2019  . Carpal tunnel syndrome of right wrist   . HTN (hypertension) 10/27/2018  . Prostate cancer ([place]) 05/30/2017  . Malignant neoplasm of prostate ([place]) 04/28/2017  . Chronic pain of both knees 02/21/2017  . Unilateral primary osteoarthritis, left knee 02/21/2017  . Unilateral primary osteoarthritis, right knee 02/21/2017   PCP:  [name], MD Pharmacy:   [id number] - [place], [place]  AT [facility] [place] [address] Phone: [phone] Fax: [phone]     Social Determinants of Health (SDOH) Interventions    Readmission Risk Interventions No flowsheet data found.

## 2020-08-16 NOTE — Progress Notes (Signed)
     Subjective: 2 Days Post-Op Procedure(s) (LRB): IRRIGATION AND DEBRIDEMENT LEFT KNEE with synovectomy WITH POLY EXCHANGE (Left) Awake, alert and oriented x 4. Culture left knee with sensitive staph a. IV antibiotics to be changed to ancef and will be scheduled for discharge tomorrow with [name] for PICCU line care and antibiotics.   Patient reports pain as moderate.    Objective:   VITALS:  Temp:  [98.1 F (36.7 C)-98.7 F (37.1 C)] 98.7 F (37.1 C) (05/28 0817) Pulse Rate:  [77-80] 80 (05/28 0817) Resp:  [16-17] 17 (05/28 0817) BP: (111-121)/(66-73) 121/73 (05/28 0817) SpO2:  [94 %-99 %] 99 % (05/28 0817)  Neurologically intact ABD soft Neurovascular intact Sensation intact distally Intact pulses distally Dorsiflexion/Plantar flexion intact Incision: dressing C/D/I and no drainage   LABS Recent Labs    08/14/20 0742 08/15/20 0048  HGB 14.7 11.5*  WBC 13.0* 7.3  PLT 124* 95*   Recent Labs    08/15/20 0048 08/16/20 0104  NA 136 133*  K 4.1 3.9  CL 101 99  CO2 30 22  BUN 24* 19  CREATININE 1.40* 1.05  GLUCOSE 133* 165*   No results for input(s): LABPT, INR in the last 72 hours.   Assessment/Plan: 2 Days Post-Op Procedure(s) (LRB): IRRIGATION AND DEBRIDEMENT LEFT KNEE with synovectomy WITH POLY EXCHANGE (Left)  Advance diet Up with therapy D/C IV fluids Continue ABX therapy due to Left TKR infection  Basil Dess 08/16/2020, 9:21 AMPatient ID: [name], male   DOB: [birth year], 65 y.o.   MRN: [id number]

## 2020-08-17 LAB — GLUCOSE, CAPILLARY
Glucose-Capillary: 113 mg/dL — ABNORMAL HIGH (ref 70–99)
Glucose-Capillary: 121 mg/dL — ABNORMAL HIGH (ref 70–99)
Glucose-Capillary: 106 mg/dL — ABNORMAL HIGH (ref 70–99)
Glucose-Capillary: 119 mg/dL — ABNORMAL HIGH (ref 70–99)

## 2020-08-17 MED ORDER — CHLORHEXIDINE GLUCONATE CLOTH 2 % EX PADS
6.0000 | MEDICATED_PAD | Freq: Every day | CUTANEOUS | Status: DC
  Administered 2020-08-17 – 2020-08-18 (×2): 6 via TOPICAL

## 2020-08-17 MED ORDER — METHOCARBAMOL 500 MG PO TABS: 500.0000 mg | ORAL_TABLET | Freq: Four times a day (QID) | ORAL | 1 refills | Status: DC | PRN

## 2020-08-17 MED ORDER — ASPIRIN EC 325 MG PO TBEC: 325.0000 mg | DELAYED_RELEASE_TABLET | Freq: Every day | ORAL | 0 refills | Status: DC

## 2020-08-17 MED ORDER — MAGNESIUM HYDROXIDE 400 MG/5ML PO SUSP
30.0000 mL | Freq: Every day | ORAL | Status: DC
  Administered 2020-08-17 – 2020-08-18 (×2): 30 mL via ORAL
  Filled 2020-08-17 (×3): qty 30

## 2020-08-17 MED ORDER — CEFAZOLIN SODIUM-DEXTROSE 2-4 GM/100ML-% IV SOLN: 2.0000 g | Freq: Three times a day (TID) | INTRAVENOUS | 1 refills | Status: DC

## 2020-08-17 MED ORDER — POLYETHYLENE GLYCOL 3350 17 G PO PACK
17.0000 g | PACK | Freq: Every day | ORAL | Status: DC
  Administered 2020-08-17 – 2020-08-18 (×2): 17 g via ORAL
  Filled 2020-08-17 (×2): qty 1

## 2020-08-17 MED ORDER — DOCUSATE SODIUM 100 MG PO CAPS: 100.0000 mg | ORAL_CAPSULE | Freq: Two times a day (BID) | ORAL | 0 refills | Status: DC

## 2020-08-17 MED ORDER — OXYCODONE HCL 5 MG PO TABS: 5.0000 mg | ORAL_TABLET | ORAL | 0 refills | Status: DC | PRN

## 2020-08-17 NOTE — Discharge Instructions (Addendum)
Take aspirin for anti DVT ( Deep venous thrombosus or clott) prophylaxis.  Keep knee incision dry for 5 days post op then may wet while bathing. Therapy daily and CPM goal full extension and greater than 90 degrees flexion. Call if fever or chills or increased drainage. Go to ER if acutely short of breath or call for ambulance. Return for follow up in 2 weeks. May full weight bear on the surgical leg unless told otherwise.Use knee immobilizer until able to straight leg raise off bed with knee stable. In house walking for first 2 weeks. INSTRUCTIONS AFTER JOINT REPLACEMENT   o Remove items at home which could result in a fall. This includes throw rugs or furniture in walking pathways o ICE to the affected joint every three hours while awake for 30 minutes at a time, for at least the first 3-5 days, and then as needed for pain and swelling.  Continue to use ice for pain and swelling. You may notice swelling that will progress down to the foot and ankle.  This is normal after surgery.  Elevate your leg when you are not up walking on it.   o Continue to use the breathing machine you got in the hospital (incentive spirometer) which will help keep your temperature down.  It is common for your temperature to cycle up and down following surgery, especially at night when you are not up moving around and exerting yourself.  The breathing machine keeps your lungs expanded and your temperature down.   DIET:  As you were doing prior to hospitalization, we recommend a well-balanced diet.  DRESSING / WOUND CARE / SHOWERING  Keep the surgical dressing until follow up.  The dressing is water proof, so you can shower without any extra covering.  IF THE DRESSING FALLS OFF or the wound gets wet inside, change the dressing with sterile gauze.  Please use good hand washing techniques before changing the dressing.  Do not use any lotions or creams on the incision until instructed by your surgeon.     ACTIVITY  o Increase activity slowly as tolerated, but follow the weight bearing instructions below.   o No driving for 6 weeks or until further direction given by your physician.  You cannot drive while taking narcotics.  o No lifting or carrying greater than 10 lbs. until further directed by your surgeon. o Avoid periods of inactivity such as sitting longer than an hour when not asleep. This helps prevent blood clots.  o You may return to work once you are authorized by your doctor.     WEIGHT BEARING   Weight bearing as tolerated with assist device (walker, cane, etc) as directed, use it as long as suggested by your surgeon or therapist, typically at least 4-6 weeks.   EXERCISES  Results after joint replacement surgery are often greatly improved when you follow the exercise, range of motion and muscle strengthening exercises prescribed by your doctor. Safety measures are also important to protect the joint from further injury. Any time any of these exercises cause you to have increased pain or swelling, decrease what you are doing until you are comfortable again and then slowly increase them. If you have problems or questions, call your caregiver or physical therapist for advice.   Rehabilitation is important following a joint replacement. After just a few days of immobilization, the muscles of the leg can become weakened and shrink (atrophy).  These exercises are designed to build up the tone and strength of  the thigh and leg muscles and to improve motion. Often times heat used for twenty to thirty minutes before working out will loosen up your tissues and help with improving the range of motion but do not use heat for the first two weeks following surgery (sometimes heat can increase post-operative swelling).   These exercises can be done on a training (exercise) mat, on the floor, on a table or on a bed. Use whatever works the best and is most comfortable for you.    Use music or  television while you are exercising so that the exercises are a pleasant break in your day. This will make your life better with the exercises acting as a break in your routine that you can look forward to.   Perform all exercises about fifteen times, three times per day or as directed.  You should exercise both the operative leg and the other leg as well.  Exercises include:   . Quad Sets - Tighten up the muscle on the front of the thigh (Quad) and hold for 5-10 seconds.   . Straight Leg Raises - With your knee straight (if you were given a brace, keep it on), lift the leg to 60 degrees, hold for 3 seconds, and slowly lower the leg.  Perform this exercise against resistance later as your leg gets stronger.  . Leg Slides: Lying on your back, slowly slide your foot toward your buttocks, bending your knee up off the floor (only go as far as is comfortable). Then slowly slide your foot back down until your leg is flat on the floor again.  [place] Haring Wings: Lying on your back spread your legs to the side as far apart as you can without causing discomfort.  . Hamstring Strength:  Lying on your back, push your heel against the floor with your leg straight by tightening up the muscles of your buttocks.  Repeat, but this time bend your knee to a comfortable angle, and push your heel against the floor.  You may put a pillow under the heel to make it more comfortable if necessary.   A rehabilitation program following joint replacement surgery can speed recovery and prevent re-injury in the future due to weakened muscles. Contact your doctor or a physical therapist for more information on knee rehabilitation.    CONSTIPATION  Constipation is defined medically as fewer than three stools per week and severe constipation as less than one stool per week.  Even if you have a regular bowel pattern at home, your normal regimen is likely to be disrupted due to multiple reasons following surgery.  Combination of anesthesia,  postoperative narcotics, change in appetite and fluid intake all can affect your bowels.   YOU MUST use at least one of the following options; they are listed in order of increasing strength to get the job done.  They are all available over the counter, and you may need to use some, POSSIBLY even all of these options:    Drink plenty of fluids (prune juice may be helpful) and high fiber foods Colace 100 mg by mouth twice a day  Senokot for constipation as directed and as needed Dulcolax (bisacodyl), take with full glass of water  Miralax (polyethylene glycol) once or twice a day as needed.  If you have tried all these things and are unable to have a bowel movement in the first 3-4 days after surgery call either your surgeon or your primary doctor.    If you experience  loose stools or diarrhea, hold the medications until you stool forms back up.  If your symptoms do not get better within 1 week or if they get worse, check with your doctor.  If you experience "the worst abdominal pain ever" or develop nausea or vomiting, please contact the office immediately for further recommendations for treatment.   ITCHING:  If you experience itching with your medications, try taking only a single pain pill, or even half a pain pill at a time.  You can also use Benadryl over the counter for itching or also to help with sleep.   TED HOSE STOCKINGS:  Use stockings on both legs until for at least 2 weeks or as directed by physician office. They may be removed at night for sleeping.  MEDICATIONS:  See your medication summary on the "After Visit Summary" that nursing will review with you.  You may have some home medications which will be placed on hold until you complete the course of blood thinner medication.  It is important for you to complete the blood thinner medication as prescribed.  PRECAUTIONS:  If you experience chest pain or shortness of breath - call 911 immediately for transfer to the hospital emergency  department.   If you develop a fever greater that 101 F, purulent drainage from wound, increased redness or drainage from wound, foul odor from the wound/dressing, or calf pain - CONTACT YOUR SURGEON.                                                   FOLLOW-UP APPOINTMENTS:  If you do not already have a post-op appointment, please call the office for an appointment to be seen by your surgeon.  Guidelines for how soon to be seen are listed in your "After Visit Summary", but are typically between 1-4 weeks after surgery.  OTHER INSTRUCTIONS:   Knee Replacement:  Do not place pillow under knee, focus on keeping the knee straight while resting. CPM instructions: 0-90 degrees, 2 hours in the morning, 2 hours in the afternoon, and 2 hours in the evening. Place foam block, curve side up under heel at all times except when in CPM or when walking.  DO NOT modify, tear, cut, or change the foam block in any way.  MAKE SURE YOU:  . Understand these instructions.  . Get help right away if you are not doing well or get worse.    Thank you for letting us be a part of your medical care team.  It is a privilege we respect greatly.  We hope these instructions will help you stay on track for a fast and full recovery!

## 2020-08-17 NOTE — TOC Progression Note (Signed)
Transition of Care [facility]) - Progression Note    Patient Details  Name: [name] MRN: [id number] Date of Birth: March 07, 1956  [facility]) CM/SW Contact  Bartholomew Crews, RN Phone Number: [phone] 08/17/2020, 11:36 AM  Clinical Narrative:     Spoke with patient and spouse at the bedside. Discussed HH nursing and IV antibiotic arrangements. Verbalized understanding of needing to stay in hospital through 2 pm dose of ancef, then can dc home. [place] RN to meet patient/spouse at home at 5:30 pm tomorrow for teaching and first home dose administration of ancef.   Discussed PT recommendations for outpatient PT. Patient in agreement. His preference is for outpatient center in [place] stating that they are great.   TOC following for transition needs.   Expected Discharge Plan: [place] Barriers to Discharge: Insurance Authorization  Expected Discharge Plan and Services Expected Discharge Plan: [place]   Discharge Planning Services: CM Consult Post Acute Care Choice: [place] arrangements for the past 2 months: Single Family Home Expected Discharge Date: 08/17/20                         HH Arranged: RN [place] Agency: [place] Date [place]: 08/16/20 Time [place]: 1039 Representative spoke with at [place]: pending referral with [facility]   Social Determinants of Health (SDOH) Interventions    Readmission Risk Interventions No flowsheet data found.

## 2020-08-17 NOTE — Progress Notes (Addendum)
     Subjective: 3 Days Post-Op Procedure(s) (LRB): IRRIGATION AND DEBRIDEMENT LEFT KNEE with synovectomy WITH POLY EXCHANGE (Left) Awake, alert and oriented x 4. Dressing changed left knee and incision is dry, painted with betadiene and new Aquacell dressing applied, no touch technique. PICCU line is installed right medial arm. He does better with walker so PICCU line area is not irritated by the crutches.   Patient reports pain as moderate.    Objective:   VITALS:  Temp:  [97.8 F (36.6 C)-98.5 F (36.9 C)] 98 F (36.7 C) (05/29 0729) Pulse Rate:  [74-77] 77 (05/29 0729) Resp:  [17-18] 18 (05/29 0729) BP: (103-121)/(59-67) 103/59 (05/29 0729) SpO2:  [96 %-97 %] 96 % (05/29 0729)  Neurologically intact ABD soft Neurovascular intact Sensation intact distally Intact pulses distally Dorsiflexion/Plantar flexion intact Incision: dressing C/D/I, no drainage and incision with minimal erythrema, erythrema about the left medial ankle is decreased, has chronic lymphedema due to previous Lymph node dissection.  No cellulitis present   LABS Recent Labs    08/15/20 0048  HGB 11.5*  WBC 7.3  PLT 95*   Recent Labs    08/15/20 0048 08/16/20 0104  NA 136 133*  K 4.1 3.9  CL 101 99  CO2 30 22  BUN 24* 19  CREATININE 1.40* 1.05  GLUCOSE 133* 165*   No results for input(s): LABPT, INR in the last 72 hours. For home health orders listed below ----------------------- Diagnosis: Prosthetic joint infection  Culture Result: staphylococcus aureus (MSSA)  No Known Allergies  OPAT Orders Discharge antibiotics to be given via PICC line Discharge antibiotics: Cefazolin 2gm IV Q 8hr Duration: 6 wk End Date: July 8th 2022  [place] Per Protocol:  Home health RN for IV administration and teaching; PICC line care and labs.   Labs weekly while on IV antibiotics: _x_ CBC with differential _x_ BMP  _x_ CRP _x ESR    _x_ Please pull PIC at completion of IV  antibiotics   Fax weekly labs to [phone]  Clinic Follow Up Appt: In 4 wk with dr Lucianne Lei dam      [facility] [phone]   Assessment/Plan: 3 Days Post-Op Procedure(s) (LRB): IRRIGATION AND DEBRIDEMENT LEFT KNEE with synovectomy WITH POLY EXCHANGE (Left)  Up with therapy Continue ABX therapy due to latent left total knee replacement infection with MSSA. Discharge home with home health  Basil Dess 08/17/2020, 9:18 AMPatient ID: [name], male   DOB: 06-30-1955, 65 y.o.   MRN: [id number]

## 2020-08-17 NOTE — TOC Progression Note (Signed)
Transition of Care [facility]) - Progression Note    Patient Details  Name: Jeven Topper MRN: [id number] Date of Birth: 13-Sep-1955  Transition of Care [facility]) CM/SW Contact  Bartholomew Crews, RN Phone Number: [phone] 08/17/2020, 11:05 AM  Clinical Narrative:     Spoke with Ginger at [facility] this morning. They have secured a HH RN for infusion therapy. RN to be at the home tomorrow at 5:30 pm. Patient will need to remain in the hospital through his 2 pm antibiotic dose tomorrow.   Expected Discharge Plan: [place] Barriers to Discharge: Insurance Authorization  Expected Discharge Plan and Services Expected Discharge Plan: [place]   Discharge Planning Services: CM Consult Post Acute Care Choice: [place] arrangements for the past 2 months: Single Family Home Expected Discharge Date: 08/17/20                         HH Arranged: RN [place] Agency: [place] Date [place]: 08/16/20 Time [place]: 1039 Representative spoke with at [place]: pending referral with [facility]   Social Determinants of Health (SDOH) Interventions    Readmission Risk Interventions No flowsheet data found.

## 2020-08-17 NOTE — Progress Notes (Signed)
Patient requesting [place] in lieu of crutches as the crutches appear to irritate his PICC line site.

## 2020-08-17 NOTE — TOC Progression Note (Signed)
Transition of Care [facility]) - Progression Note    Patient Details  Name: [name] MRN: [id number] Date of Birth: 02-02-1956  Transition of Care [facility]) CM/SW Contact  Bartholomew Crews, RN Phone Number: [phone] 08/17/2020, 10:27 AM  Clinical Narrative:     Late entry from late yesterday afternoon - spoke with Ginger at [facility] to discuss plan for transition home. [facility] stated that submitting for authorization on Tuesday does not have to hold patient discharge.   Referral declined by [facility] for [facility] RN for infusion therapy d/t no staffing for infusion nursing at this time. [facility] to arrange for [facility] RN - this remains pending at this time.   Spoke with ID about OPAT orders. Antibiotics to be changed to cefazolin nowt that sensitivities back. Orders and clinical notes faxed to [facility] at [phone].   Spoke with patient at the bedside. Reviewed out of pocket daily costs as estimated per [facility] d/t patient having not met deductible.   TOC following for transition needs.   Expected Discharge Plan: [place] Barriers to Discharge: Insurance Authorization  Expected Discharge Plan and Services Expected Discharge Plan: [place]   Discharge Planning Services: CM Consult Post Acute Care Choice: [place] arrangements for the past 2 months: Single Family Home Expected Discharge Date: 08/17/20                         HH Arranged: RN [place] Agency: [place] Date [place]: 08/16/20 Time [place]: 1039 Representative spoke with at [place]: pending referral with [facility]   Social Determinants of Health (SDOH) Interventions    Readmission Risk Interventions No flowsheet data found.

## 2020-08-18 ENCOUNTER — Telehealth: Payer: Self-pay | Admitting: Surgery

## 2020-08-18 ENCOUNTER — Encounter (HOSPITAL_COMMUNITY): Payer: Self-pay | Admitting: Orthopaedic Surgery

## 2020-08-18 LAB — GLUCOSE, CAPILLARY
Glucose-Capillary: 63 mg/dL — ABNORMAL LOW (ref 70–99)
Glucose-Capillary: 139 mg/dL — ABNORMAL HIGH (ref 70–99)
Glucose-Capillary: 169 mg/dL — ABNORMAL HIGH (ref 70–99)

## 2020-08-18 MED ORDER — CEFAZOLIN SODIUM-DEXTROSE 2-4 GM/100ML-% IV SOLN
2.0000 g | Freq: Three times a day (TID) | INTRAVENOUS | Status: DC
  Administered 2020-08-18: 2 g via INTRAVENOUS
  Filled 2020-08-18 (×2): qty 100

## 2020-08-18 MED ORDER — RIFAMPIN 300 MG PO CAPS: 300.0000 mg | ORAL_CAPSULE | Freq: Two times a day (BID) | ORAL | 6 refills | Status: DC

## 2020-08-18 MED ORDER — RIFAMPIN 300 MG PO CAPS: 300.0000 mg | ORAL_CAPSULE | Freq: Two times a day (BID) | ORAL | 0 refills | Status: DC

## 2020-08-18 MED ORDER — RIFAMPIN 300 MG PO CAPS
300.0000 mg | ORAL_CAPSULE | Freq: Two times a day (BID) | ORAL | Status: DC
  Administered 2020-08-18: 300 mg via ORAL
  Filled 2020-08-18 (×2): qty 1

## 2020-08-18 MED ORDER — CEFAZOLIN IV (FOR PTA / DISCHARGE USE ONLY): 2.0000 g | Freq: Three times a day (TID) | INTRAVENOUS | 0 refills | Status: AC

## 2020-08-18 NOTE — Plan of Care (Signed)

## 2020-08-18 NOTE — Progress Notes (Signed)
Physical Therapy Treatment Patient Details Name: [name] MRN: [id number] DOB: 12-18-1955 Today's Date: 08/18/2020    History of Present Illness Pt is 65 yo male admitted w L knee joint effusion and questionable prosthetic infection on 08/14/20. He is s/p I and D with synovectomy all 3 compartments and poly liner exchange on 08/14/20 and plan for PICC. Pt with PMH including arthritis, DM, depression, HTN, L TKA 02/2019, recent urologic issues, prostate CA s/p sx with several lymph nodes removed.    PT Comments    Pt's L LE remains extremely edematous with erythema however pt able to and eager to participate in L LE exercises and ROM. Pt asking regarding using his lymphedema machine for his L LE, pt instructed to talk to MD. Pt requiring RW not for ambulation as the crutches rub against and irritate picc line in the inside of pt's R UE. Acute PT to cont to follow.   Follow Up Recommendations  Outpatient PT;Supervision - Intermittent     Equipment Recommendations  Rolling walker with 5" wheels    Recommendations for Other Services       Precautions / Restrictions Precautions Precautions: Fall Precaution Comments: extreme edema in L LE Restrictions Weight Bearing Restrictions: Yes LLE Weight Bearing: Weight bearing as tolerated    Mobility  Bed Mobility Overal bed mobility: Needs Assistance Bed Mobility: Supine to Sit     Supine to sit: Supervision     General bed mobility comments: educated pt on completing L quad set and then adducting leg across the bed to sit EOB    Transfers Overall transfer level: Needs assistance Equipment used: Rolling walker (2 wheeled) Transfers: Sit to/from Stand Sit to Stand: Min guard         General transfer comment: verbal cues for hand placement  Ambulation/Gait Ambulation/Gait assistance: Supervision Gait Distance (Feet): 200 Feet Assistive device: Rolling walker (2 wheeled);Crutches Gait Pattern/deviations: Step-through  pattern;Decreased stride length Gait velocity: dec as expected   General Gait Details: initially step to gait pattern slowly transitioning to step through gait pattern, pt prefers RW over crutches at this time due to PICC line in inside of R UE in which the crutches rub/irritate it   Stairs             Wheelchair Mobility    Modified Rankin (Stroke Patients Only)       Balance Overall balance assessment: Mild deficits observed, not formally tested                                          Cognition Arousal/Alertness: Awake/alert Behavior During Therapy: WFL for tasks assessed/performed Overall Cognitive Status: Within Functional Limits for tasks assessed                                        Exercises General Exercises - Lower Extremity Ankle Circles/Pumps: AROM;Both;10 reps;Supine Quad Sets: AROM;Left;10 reps;Supine Long Arc Quad: AROM;Left;10 reps;Seated Heel Slides: AAROM;Left;10 reps;Seated (pt very edematous, AA to increase knee flexion, achieved approx 50 deg) Hip ABduction/ADduction: AROM;Left;10 reps;Supine Straight Leg Raises: AROM;Left;10 reps;Supine    General Comments General comments (skin integrity, edema, etc.): pt with L LE edema from hip to ankle      Pertinent Vitals/Pain Pain Assessment: 0-10 Pain Score: 6  Pain Location: L knee Pain  Descriptors / Indicators: Tightness Pain Intervention(s): Monitored during session    Home Living                      Prior Function            PT Goals (current goals can now be found in the care plan section) Acute Rehab PT Goals Patient Stated Goal: home today Progress towards PT goals: Progressing toward goals    Frequency    Min 5X/week      PT Plan Current plan remains appropriate    Co-evaluation              AM-PAC PT "6 Clicks" Mobility   Outcome Measure  Help needed turning from your back to your side while in a flat bed without  using bedrails?: None Help needed moving from lying on your back to sitting on the side of a flat bed without using bedrails?: None Help needed moving to and from a bed to a chair (including a wheelchair)?: A Little Help needed standing up from a chair using your arms (e.g., wheelchair or bedside chair)?: A Little Help needed to walk in hospital room?: A Little Help needed climbing 3-5 steps with a railing? : A Little 6 Click Score: 20    End of Session   Activity Tolerance: Patient tolerated treatment well Patient left: in bed;with call bell/phone within reach (with ice to L knee and L LE elevated) Nurse Communication: Mobility status (need for RW) PT Visit Diagnosis: Other abnormalities of gait and mobility (R26.89);Muscle weakness (generalized) (M62.81)     Time: 7106-2694 PT Time Calculation (min) (ACUTE ONLY): 32 min  Charges:  $Gait Training: 8-22 mins $Therapeutic Exercise: 8-22 mins                     Kittie Plater, PT, DPT Acute Rehabilitation Services Pager #: [phone] Office #: [phone]    Berline Lopes 08/18/2020, 11:50 AM

## 2020-08-18 NOTE — Progress Notes (Signed)
Hypoglycemic Event  CBG: 63  Treatment: graham cracker, peanut butter and OJ  Symptoms: none  Follow-up CBG: [id number] CBG Result:139  Possible Reasons for Event: poor po intake   Comments/MD notified:No    [place], Hector Shade

## 2020-08-18 NOTE — TOC Transition Note (Addendum)
Transition of Care [facility]) - CM/SW Discharge Note   Patient Details  Name: [name] MRN: [id number] Date of Birth: [birth year]  Transition of Care [facility]) CM/SW Contact:  Milinda Antis, [place] Phone Number: 08/18/2020, 10:01 AM   Clinical Narrative:    Patient will DC to: Home with [facility] Anticipated DC date: 08/18/2020 Family notified: Yes Transport by: Raechel Chute   Per MD patient ready for DC home with home health. RN, patient and patient's family notified of DC.  CSW verified patient's phone number and address.   The patient will be receiving IV abx and HHRN through [facility] from [place] will go to the home today at around 5pm to educate the patient.  This information was verified with [facility] at [place] [phone].  DME was ordered through [employer].  The patient does not have any concerns for affording medications. The patient reports that his spouse will transport home.  CSW will sign off for now as social work intervention is no longer needed. Please consult [place] again if new needs arise.     Final next level of care: Home w Home Health Services Barriers to Discharge: No Barriers Identified   Patient Goals and CMS Choice   CMS Medicare.gov Compare Post Acute Care list provided to:: Patient Choice offered to / list presented to : Patient  Discharge Placement                    Patient and family notified of of transfer: 08/18/20  Discharge Plan and Services   Discharge Planning Services: CM Consult Post Acute Care Choice: Home Health                    HH Arranged: RN [facility] Agency: Other - See comment ([place]) Date HH Agency Contacted: 08/16/20 Time HH Agency Contacted: 1500 Representative spoke with at [place]: [place] will provide [facility]  Social Determinants of Health ([place]) Interventions     Readmission Risk Interventions No flowsheet data found.

## 2020-08-18 NOTE — Plan of Care (Signed)

## 2020-08-18 NOTE — Progress Notes (Signed)
Patient ID: [name], male   DOB: September 09, 1955, 65 y.o.   MRN: [id number] According to the transitional care team's note, the patient has been set up for IV antibiotics to start at 5:30 PM today at home.  He is stable otherwise.  He can be discharged to home today for least 6 weeks of IV antibiotics.  Follow-up will be arranged in our office as well as with the infectious disease service.  His knee is otherwise stable.  His blood glucose is under better control and I counseled him about this as well.

## 2020-08-18 NOTE — Discharge Summary (Signed)
Patient ID: [name] MRN: [id number] DOB/AGE: 05/02/1955 65 y.o.  Admit date: 08/14/2020 Discharge date: 08/18/2020  Admission Diagnoses:  Principal Problem:   Infection of total left knee replacement ([place]) Active Problems:   Effusion, left knee   Discharge Diagnoses:  Same  Past Medical History:  Diagnosis Date  . Arthritis   . Depression   . Diabetes mellitus without complication ([place]) 0929   Diabetes Type II  . GERD (gastroesophageal reflux disease)    diet controlled  . HTN (hypertension) 10/27/2018  . Hyperlipidemia   . Internal hemorrhoids 2014   noted on colonoscopy  . Osteoarthritis of both knees   . Prostate cancer [facility])    prostate  . Sigmoid diverticulosis 2014   Mild, noted on colonoscopy    Surgeries: Procedure(s): IRRIGATION AND DEBRIDEMENT LEFT KNEE with synovectomy WITH POLY EXCHANGE on 08/14/2020   Consultants:   Discharged Condition: Improved  Hospital Course: [name] is an 65 y.o. male who was admitted 08/14/2020 for operative treatment ofInfection of total left knee replacement ([place]). Patient has severe unremitting pain that affects sleep, daily activities, and work/hobbies. After pre-op clearance the patient was taken to the operating room on 08/14/2020 and underwent  Procedure(s): IRRIGATION AND DEBRIDEMENT LEFT KNEE with synovectomy WITH POLY EXCHANGE.    Patient was given perioperative antibiotics:  Anti-infectives (From admission, onward)   Start     Dose/Rate Route Frequency Ordered Stop   08/18/20 0000  ceFAZolin (ANCEF) IVPB        2 g Intravenous Every 8 hours 08/18/20 0854 09/27/20 2359   08/17/20 0000  ceFAZolin (ANCEF) 2-4 GM/100ML-% IVPB        2 g Intravenous Every 8 hours 08/17/20 1005     08/16/20 2200  ceFAZolin (ANCEF) IVPB 2g/100 mL premix        2 g 200 mL/hr over 30 Minutes Intravenous Every 8 hours 08/16/20 1905     08/16/20 2000  DAPTOmycin (CUBICIN) 700 mg in sodium chloride 0.9 % IVPB  Status:   Discontinued       Note to Pharmacy: Administer early since patient is going home. Please give now   8 mg/kg  86.2 kg 128 mL/hr over 30 Minutes Intravenous Daily 08/16/20 1425 08/16/20 1426   08/16/20 1515  DAPTOmycin (CUBICIN) 700 mg in sodium chloride 0.9 % IVPB  Status:  Discontinued       Note to Pharmacy: Administer early since patient is going home. Please give now   8 mg/kg  86.2 kg 128 mL/hr over 30 Minutes Intravenous Daily 08/16/20 1426 08/16/20 1832   08/15/20 1030  DAPTOmycin (CUBICIN) 700 mg in sodium chloride 0.9 % IVPB  Status:  Discontinued        8 mg/kg  86.2 kg 128 mL/hr over 30 Minutes Intravenous Daily 08/15/20 0944 08/16/20 1425   08/14/20 2000  ceFAZolin (ANCEF) IVPB 2g/100 mL premix  Status:  Discontinued        2 g 200 mL/hr over 30 Minutes Intravenous Every 8 hours 08/14/20 1348 08/14/20 1415   08/14/20 1515  cefTRIAXone (ROCEPHIN) 2 g in sodium chloride 0.9 % 100 mL IVPB  Status:  Discontinued        2 g 200 mL/hr over 30 Minutes Intravenous Every 24 hours 08/14/20 1415 08/15/20 0944   08/14/20 1115  vancomycin (VANCOCIN) powder  Status:  Discontinued          As needed 08/14/20 1115 08/14/20 1204   08/14/20 1045  vancomycin (VANCOREADY) IVPB  1250 mg/250 mL        1,250 mg 166.7 mL/hr over 90 Minutes Intravenous To Surgery 08/14/20 1044 08/14/20 1233   08/14/20 0745  ceFAZolin (ANCEF) IVPB 2g/100 mL premix        2 g 200 mL/hr over 30 Minutes Intravenous On call to O.R. 08/14/20 0741 08/14/20 1107   08/14/20 0744  ceFAZolin (ANCEF) 2-4 GM/100ML-% IVPB       Note to Pharmacy: Humberto Leep   : cabinet override      08/14/20 0744 08/14/20 1058       Patient was given sequential compression devices, early ambulation, and chemoprophylaxis to prevent DVT.  Patient benefited maximally from hospital stay and there were no complications.  His blood glucose was under better control by discharge and he was counseled about maintaining good blood glucose control.   His cultures did grow out a sensitive staph aureus.  The infectious disease service was consulted to help guide an antibiotic type and duration.  Recent vital signs:  Patient Vitals for the past 24 hrs:  BP Temp Temp src Pulse Resp SpO2  08/18/20 0806 109/68 97.8 F (36.6 C) Oral 68 16 99 %  08/18/20 0556 115/67 98.1 F (36.7 C) Oral 72 17 95 %  08/17/20 2045 (!) 113/54 99.6 F (37.6 C) Oral -- 18 96 %  08/17/20 1214 103/61 97.9 F (36.6 C) -- 77 18 95 %     Recent laboratory studies:  Recent Labs    08/16/20 0104  NA 133*  K 3.9  CL 99  CO2 22  BUN 19  CREATININE 1.05  GLUCOSE 165*  CALCIUM 8.1*     Discharge Medications:   Allergies as of 08/18/2020   No Known Allergies     Medication List    STOP taking these medications   oxycodone 5 MG capsule Commonly known as: OXY-IR Replaced by: oxyCODONE 5 MG immediate release tablet     TAKE these medications   aspirin EC 325 MG tablet Take 1 tablet (325 mg total) by mouth daily.   ceFAZolin  IVPB Commonly known as: ANCEF Inject 2 g into the vein every 8 (eight) hours. Indication:  prosthetic joint infection First Dose: No Last Day of Therapy:  09/26/2020 Labs - Once weekly:  CBC/D and BMP, Labs - Every other week:  ESR and CRP Method of administration: IV Push Method of administration may be changed at the discretion of home infusion pharmacist based upon assessment of the patient and/or caregiver's ability to self-administer the medication ordered.   ceFAZolin 2-4 GM/100ML-% IVPB Commonly known as: ANCEF Inject 100 mLs (2 g total) into the vein every 8 (eight) hours.   dexamethasone 2 MG tablet Commonly known as: DECADRON Take 2 mg by mouth at bedtime.   diclofenac 75 MG EC tablet Commonly known as: VOLTAREN TAKE 1 TABLET BY MOUTH 2 TIMES DAILY BETWEEN MEALS AS NEEDED.   docusate sodium 100 MG capsule Commonly known as: COLACE Take 1 capsule (100 mg total) by mouth 2 (two) times daily.   escitalopram  10 MG tablet Commonly known as: LEXAPRO Take 10 mg by mouth daily.   Fish Oil Maximum Strength 1200 MG Caps Take 1 capsule by mouth daily.   insulin aspart 100 UNIT/ML injection Commonly known as: novoLOG Inject 8 Units into the skin 3 (three) times daily with meals.   losartan-hydrochlorothiazide 50-12.5 MG tablet Commonly known as: HYZAAR TAKE 1 TABLET BY MOUTH EVERY DAY   methocarbamol 500 MG tablet Commonly  known as: ROBAXIN Take 1 tablet (500 mg total) by mouth every 6 (six) hours as needed for muscle spasms.   naproxen sodium 220 MG tablet Commonly known as: ALEVE Take 220-440 mg by mouth daily as needed (pain).   oxyCODONE 5 MG immediate release tablet Commonly known as: Oxy IR/ROXICODONE Take 1-2 tablets (5-10 mg total) by mouth every 4 (four) hours as needed for moderate pain (pain score 4-6). Replaces: oxycodone 5 MG capsule   rosuvastatin 20 MG tablet Commonly known as: CRESTOR TAKE 1 TABLET BY MOUTH EVERY DAY   Tresiba FlexTouch 100 UNIT/ML FlexTouch Pen Generic drug: insulin degludec Inject 64 Units into the skin daily.            Discharge Care Instructions  (From admission, onward)         Start     Ordered   08/18/20 0000  Change dressing on IV access line weekly and PRN  (Home infusion instructions - [facility] )        08/18/20 0854          Diagnostic Studies: XR Knee 1-2 Views Left  Result Date: 08/13/2020 Left knee 2 views: No acute fracture.  Left total knee arthroplasty components appear well seated.  AP views quite oblique though.  No obvious signs of loosening.  [place] EKG SITE RITE  Result Date: 08/15/2020 If Site Rite image not attached, placement could not be confirmed due to current cardiac rhythm.   Disposition: Discharge disposition: 01-Home or Self Care       Discharge Instructions    [facility] pharmacist to adjust dose for Vancomycin, Aminoglycosides and other anti-infective therapies as  requested by physician.   Complete by: As directed    [facility] to provide Cath Flo 64m   Complete by: As directed    Administer for PICC line occlusion and as ordered by physician for other access device issues.   Ambulatory referral to Physical Therapy   Complete by: As directed    Anaphylaxis Kit: Provided to treat any anaphylactic reaction to the medication being provided to the patient if First Dose or when requested by physician   Complete by: As directed    Epinephrine 127mml vial / amp: Administer 0.63m86m0.63ml7mubcutaneously once for moderate to severe anaphylaxis, nurse to call physician and pharmacy when reaction occurs and call 911 if needed for immediate care   Diphenhydramine 50mg67mIV vial: Administer 25-50mg 44mM PRN for first dose reaction, rash, itching, mild reaction, nurse to call physician and pharmacy when reaction occurs   Sodium Chloride 0.9% NS 500ml I563mdminister if needed for hypovolemic blood pressure drop or as ordered by physician after call to physician with anaphylactic reaction   Call MD / Call 911   Complete by: As directed    If you experience chest pain or shortness of breath, CALL 911 and be transported to the hospital emergency room.  If you develope a fever above 101 F, pus (white drainage) or increased drainage or redness at the wound, or calf pain, call your surgeon's office.   Change dressing on IV access line weekly and PRN   Complete by: As directed    Constipation Prevention   Complete by: As directed    Drink plenty of fluids.  Prune juice may be helpful.  You may use a stool softener, such as Colace (over the counter) 100 mg twice a day.  Use MiraLax (over the counter) for constipation as needed.  Diet Carb Modified   Complete by: As directed    Discharge instructions   Complete by: As directed    Take aspirin for anti DVT ( Deep venous thrombosus or clott) prophylaxis.  Keep knee incision dry for 5 days post op then may wet  while bathing. Therapy daily and CPM goal full extension and greater than 90 degrees flexion. Call if fever or chills or increased drainage. Go to ER if acutely short of breath or call for ambulance. Return for follow up in 2 weeks. May full weight bear on the surgical leg unless told otherwise.Use knee immobilizer until able to straight leg raise off bed with knee stable. In house walking for first 2 weeks. INSTRUCTIONS AFTER JOINT REPLACEMENT   Remove items at home which could result in a fall. This includes throw rugs or furniture in walking pathways ICE to the affected joint every three hours while awake for 30 minutes at a time, for at least the first 3-5 days, and then as needed for pain and swelling.  Continue to use ice for pain and swelling. You may notice swelling that will progress down to the foot and ankle.  This is normal after surgery.  Elevate your leg when you are not up walking on it.   Continue to use the breathing machine you got in the hospital (incentive spirometer) which will help keep your temperature down.  It is common for your temperature to cycle up and down following surgery, especially at night when you are not up moving around and exerting yourself.  The breathing machine keeps your lungs expanded and your temperature down.   DIET:  As you were doing prior to hospitalization, we recommend a well-balanced diet.  DRESSING / WOUND CARE / SHOWERING  Keep the surgical dressing until follow up.  The dressing is water proof, so you can shower without any extra covering.  IF THE DRESSING FALLS OFF or the wound gets wet inside, change the dressing with sterile gauze.  Please use good hand washing techniques before changing the dressing.  Do not use any lotions or creams on the incision until instructed by your surgeon.    ACTIVITY  Increase activity slowly as tolerated, but follow the weight bearing instructions below.   No driving for 6 weeks or until further direction  given by your physician.  You cannot drive while taking narcotics.  No lifting or carrying greater than 10 lbs. until further directed by your surgeon. Avoid periods of inactivity such as sitting longer than an hour when not asleep. This helps prevent blood clots.  You may return to work once you are authorized by your doctor.     WEIGHT BEARING   Weight bearing as tolerated with assist device (walker, cane, etc) as directed, use it as long as suggested by your surgeon or therapist, typically at least 4-6 weeks.   EXERCISES  Results after joint replacement surgery are often greatly improved when you follow the exercise, range of motion and muscle strengthening exercises prescribed by your doctor. Safety measures are also important to protect the joint from further injury. Any time any of these exercises cause you to have increased pain or swelling, decrease what you are doing until you are comfortable again and then slowly increase them. If you have problems or questions, call your caregiver or physical therapist for advice.   Rehabilitation is important following a joint replacement. After just a few days of immobilization, the muscles of the leg can become weakened  and shrink (atrophy).  These exercises are designed to build up the tone and strength of the thigh and leg muscles and to improve motion. Often times heat used for twenty to thirty minutes before working out will loosen up your tissues and help with improving the range of motion but do not use heat for the first two weeks following surgery (sometimes heat can increase post-operative swelling).   These exercises can be done on a training (exercise) mat, on the floor, on a table or on a bed. Use whatever works the best and is most comfortable for you.    Use music or television while you are exercising so that the exercises are a pleasant break in your day. This will make your life better with the exercises acting as a break in your  routine that you can look forward to.   Perform all exercises about fifteen times, three times per day or as directed.  You should exercise both the operative leg and the other leg as well.  Exercises include:   Quad Sets - Tighten up the muscle on the front of the thigh (Quad) and hold for 5-10 seconds.   Straight Leg Raises - With your knee straight (if you were given a brace, keep it on), lift the leg to 60 degrees, hold for 3 seconds, and slowly lower the leg.  Perform this exercise against resistance later as your leg gets stronger.  Leg Slides: Lying on your back, slowly slide your foot toward your buttocks, bending your knee up off the floor (only go as far as is comfortable). Then slowly slide your foot back down until your leg is flat on the floor again.  Angel Wings: Lying on your back spread your legs to the side as far apart as you can without causing discomfort.  Hamstring Strength:  Lying on your back, push your heel against the floor with your leg straight by tightening up the muscles of your buttocks.  Repeat, but this time bend your knee to a comfortable angle, and push your heel against the floor.  You may put a pillow under the heel to make it more comfortable if necessary.   A rehabilitation program following joint replacement surgery can speed recovery and prevent re-injury in the future due to weakened muscles. Contact your doctor or a physical therapist for more information on knee rehabilitation.    CONSTIPATION  Constipation is defined medically as fewer than three stools per week and severe constipation as less than one stool per week.  Even if you have a regular bowel pattern at home, your normal regimen is likely to be disrupted due to multiple reasons following surgery.  Combination of anesthesia, postoperative narcotics, change in appetite and fluid intake all can affect your bowels.   YOU MUST use at least one of the following options; they are listed in order of  increasing strength to get the job done.  They are all available over the counter, and you may need to use some, POSSIBLY even all of these options:    Drink plenty of fluids (prune juice may be helpful) and high fiber foods Colace 100 mg by mouth twice a day  Senokot for constipation as directed and as needed Dulcolax (bisacodyl), take with full glass of water  Miralax (polyethylene glycol) once or twice a day as needed.  If you have tried all these things and are unable to have a bowel movement in the first 3-4 days after surgery call either your  surgeon or your primary doctor.    If you experience loose stools or diarrhea, hold the medications until you stool forms back up.  If your symptoms do not get better within 1 week or if they get worse, check with your doctor.  If you experience "the worst abdominal pain ever" or develop nausea or vomiting, please contact the office immediately for further recommendations for treatment.   ITCHING:  If you experience itching with your medications, try taking only a single pain pill, or even half a pain pill at a time.  You can also use Benadryl over the counter for itching or also to help with sleep.   TED HOSE STOCKINGS:  Use stockings on both legs until for at least 2 weeks or as directed by physician office. They may be removed at night for sleeping.  MEDICATIONS:  See your medication summary on the "After Visit Summary" that nursing will review with you.  You may have some home medications which will be placed on hold until you complete the course of blood thinner medication.  It is important for you to complete the blood thinner medication as prescribed.  PRECAUTIONS:  If you experience chest pain or shortness of breath - call 911 immediately for transfer to the hospital emergency department.   If you develop a fever greater that 101 F, purulent drainage from wound, increased redness or drainage from wound, foul odor from the wound/dressing, or  calf pain - CONTACT YOUR SURGEON.                                                   FOLLOW-UP APPOINTMENTS:  If you do not already have a post-op appointment, please call the office for an appointment to be seen by your surgeon.  Guidelines for how soon to be seen are listed in your "After Visit Summary", but are typically between 1-4 weeks after surgery.  OTHER INSTRUCTIONS:   Knee Replacement:  Do not place pillow under knee, focus on keeping the knee straight while resting. CPM instructions: 0-90 degrees, 2 hours in the morning, 2 hours in the afternoon, and 2 hours in the evening. Place foam block, curve side up under heel at all times except when in CPM or when walking.  DO NOT modify, tear, cut, or change the foam block in any way.  MAKE SURE YOU:  Understand these instructions.  Get help right away if you are not doing well or get worse.    Thank you for letting us be a part of your medical care team.  It is a privilege we respect greatly.  We hope these instructions will help you stay on track for a fast and full recovery!    For home health orders listed below ----------------------- Diagnosis: Prosthetic joint infection  Culture Result:staphylococcus aureus(MSSA)  No Known Allergies  OPAT Orders Discharge antibiotics to be given via PICC line Discharge antibiotics: Cefazolin 2gm IV Q 8hr Duration: 6 wk End Date: July 8th 2022  [place] Per Protocol:  Home health RN for IV administration and teaching; PICC line care and labs.   Labs weekly while on IV antibiotics: _x_ CBC with differential _x_ BMP  _x_ CRP _xESR    _x_ Please pull PIC at completion of IV antibiotics   Fax weekly labs to [phone]  Clinic Follow Up Appt: In  4 wk with dr Lucianne Lei dam      [facility] [place] [facility] [phone]   Driving restrictions   Complete by: As directed    No driving for 3 weeks   Flush IV access  with Sodium Chloride 0.9% and Heparin 10 units/ml or 100 units/ml   Complete by: As directed    Home infusion instructions - [facility]   Complete by: As directed    Instructions: Flush IV access with Sodium Chloride 0.9% and Heparin 10units/ml or 100units/ml   Change dressing on IV access line: Weekly and PRN   Instructions Cath Flo 58m: Administer for PICC Line occlusion and as ordered by physician for other access device   [facility] pharmacist to adjust dose for: Vancomycin, Aminoglycosides and other anti-infective therapies as requested by physician   Increase activity slowly as tolerated   Complete by: As directed    Lifting restrictions   Complete by: As directed    No lifting for 8 weeks   Method of administration may be changed at the discretion of home infusion pharmacist based upon assessment of the patient and/or caregiver's ability to self-administer the medication ordered   Complete by: As directed    Post-operative opioid taper instructions:   Complete by: As directed    POST-OPERATIVE OPIOID TAPER INSTRUCTIONS: It is important to wean off of your opioid medication as soon as possible. If you do not need pain medication after your surgery it is ok to stop day one. Opioids include: Codeine, Hydrocodone(Norco, Vicodin), Oxycodone(Percocet, oxycontin) and hydromorphone amongst others.  Long term and even short term use of opiods can cause: Increased pain response Dependence Constipation Depression Respiratory depression And more.  Withdrawal symptoms can include Flu like symptoms Nausea, vomiting And more Techniques to manage these symptoms Hydrate well Eat regular healthy meals Stay active Use relaxation techniques(deep breathing, meditating, yoga) Do Not substitute Alcohol to help with tapering If you have been on opioids for less than two weeks and do not have pain than it is ok to stop all together.  Plan to wean off of opioids This plan  should start within one week post op of your joint replacement. Maintain the same interval or time between taking each dose and first decrease the dose.  Cut the total daily intake of opioids by one tablet each day Next start to increase the time between doses. The last dose that should be eliminated is the evening dose.          Follow-up Information    BMcarthur Rossetti MD Follow up in 2 week(s).   Specialty: Orthopedic Surgery Why: left knee wound check Contact information: [phone]        Van Dam, Cornelius N, MD Follow up in 4 week(s).   Specialty: Infectious Diseases Contact information: [address] [phone]               Signed: CMcarthur Rossetti5/30/2022, 8:54 AM

## 2020-08-18 NOTE — Telephone Encounter (Signed)
ED RNCM received call from [facility] on 5N regarding call received from patient concerned that home infusion Nurse has not show up as of yet and next dose due tonight.  ED CM reviewed patient's record and noted [employer] to be the Home infusion vendor, CM contacted Shelly at [phone] she informed me that the nurse is scheduled and should be on her way she will make another attempt to find out her ETA. CM contacted patient's Daughter Mirna Mires to update her and she reports that she just heard from the nurse that is on her way.  No further ED RNCM needs identified.

## 2020-08-19 ENCOUNTER — Telehealth: Payer: Self-pay | Admitting: Orthopaedic Surgery

## 2020-08-19 ENCOUNTER — Other Ambulatory Visit: Payer: Self-pay

## 2020-08-19 DIAGNOSIS — E11610 Type 2 diabetes mellitus with diabetic neuropathic arthropathy: Secondary | ICD-10-CM

## 2020-08-19 DIAGNOSIS — T8454XA Infection and inflammatory reaction due to internal left knee prosthesis, initial encounter: Secondary | ICD-10-CM

## 2020-08-19 LAB — SYNOVIAL FLUID ANALYSIS, COMPLETE
Basophils, %: 0 %
Eosinophils-Synovial: 0 % (ref 0–2)
Lymphocytes-Synovial Fld: 1 % (ref 0–74)
Monocyte/Macrophage: 13 % (ref 0–69)
Neutrophil, Synovial: 86 % — ABNORMAL HIGH (ref 0–24)
Synoviocytes, %: 0 % (ref 0–15)
WBC, Synovial: 109000 cells/uL — ABNORMAL HIGH (ref <150)

## 2020-08-19 LAB — AEROBIC/ANAEROBIC CULTURE W GRAM STAIN (SURGICAL/DEEP WOUND)

## 2020-08-19 LAB — ANAEROBIC AND AEROBIC CULTURE
MICRO NUMBER:: 11933655
MICRO NUMBER:: 11933656
SPECIMEN QUALITY:: ADEQUATE
SPECIMEN QUALITY:: ADEQUATE

## 2020-08-19 NOTE — Telephone Encounter (Signed)
I'll check on it and let you know. Thanks.

## 2020-08-19 NOTE — Telephone Encounter (Signed)
Per Venida Jarvis pt was seen this morning

## 2020-08-19 NOTE — Telephone Encounter (Signed)
Patient wife states he is in so much pain he's not getting up or eating much; she's asking when she should see an improvement?

## 2020-08-19 NOTE — Telephone Encounter (Signed)
Hopefully he will get some relief at some point.  He looked pretty good this past weekend when we let him go.  He is on oxycodone and Robaxin.  He can always try to take 3-4 Advil 2-3 times a day with meals for the next few days which can help.

## 2020-08-19 NOTE — Telephone Encounter (Signed)
Pt's s.o. Diane called asking to speak with a nurse about recovery that a pt has, recovery care tips, and when they can start seeing an improvement for the pt as well as what to look for if he does not improve. The best call back number is [phone].

## 2020-08-19 NOTE — Telephone Encounter (Signed)
Received a message from [facility] yesterday stating pt called him and said [facility] never showed up to show him how to get abx though picc line. Can you help me with this please?

## 2020-08-19 NOTE — Telephone Encounter (Signed)
Pt's s.o. Diane called asking to speak with a nurse about recovery that a pt has, recovery care tips, and when they can start seeing an improvement for the pt as well as what to look for if he does not improve. The best call back number is [phone].

## 2020-08-19 NOTE — Telephone Encounter (Signed)
[name] aware of below message from Dr. Ninfa Linden

## 2020-08-19 NOTE — Telephone Encounter (Signed)
Ok thank you 

## 2020-08-22 ENCOUNTER — Other Ambulatory Visit: Payer: Self-pay | Admitting: *Deleted

## 2020-08-22 NOTE — Patient Outreach (Signed)
[facility]) Care Management  08/22/2020  [name] Dec 26, 1955 [id number]   [facility] Unsuccessful outreach for post hospital/complex care referral  Referral Date: 08/19/20 Referral Source: [facility] hospital liaison Referral Reason:post hospital/complex care referral [place] Last cone admission 08/14/20 to 08/18/20 infection of total left knee replacement, left knee effusion   Outreach attempt to the home number  No answer. [facility] RN CM left HIPAA [facility] Portability and Accountability Act) compliant voicemail message along with CM's contact info.   Past Medical History:  Diagnosis Date  . Arthritis   . Depression   . Diabetes mellitus without complication ([place]) 3507   Diabetes Type II  . GERD (gastroesophageal reflux disease)    diet controlled  . HTN (hypertension) 10/27/2018  . Hyperlipidemia   . Internal hemorrhoids 2014   noted on colonoscopy  . Osteoarthritis of both knees   . Prostate cancer [facility])    prostate  . Sigmoid diverticulosis 2014   Mild, noted on colonoscopy    Plan: [facility] RN CM scheduled this patient for another call attempt within 4-7 business days Mailed unsuccessful outreach letter    [place]. Lavina Hamman, RN, BSN, [place] Coordinator Office number [phone] Mobile number [phone]  Main [facility] number [phone] Fax number [phone]

## 2020-08-25 ENCOUNTER — Other Ambulatory Visit: Payer: Self-pay | Admitting: *Deleted

## 2020-08-25 ENCOUNTER — Other Ambulatory Visit: Payer: Self-pay

## 2020-08-25 ENCOUNTER — Other Ambulatory Visit: Payer: Self-pay | Admitting: Cardiology

## 2020-08-25 ENCOUNTER — Encounter: Payer: Self-pay | Admitting: Orthopaedic Surgery

## 2020-08-25 ENCOUNTER — Ambulatory Visit (INDEPENDENT_AMBULATORY_CARE_PROVIDER_SITE_OTHER): Payer: 59 | Admitting: Orthopaedic Surgery

## 2020-08-25 ENCOUNTER — Other Ambulatory Visit: Payer: Self-pay | Admitting: Specialist

## 2020-08-25 VITALS — Wt 192.4 lb

## 2020-08-25 DIAGNOSIS — I10 Essential (primary) hypertension: Secondary | ICD-10-CM

## 2020-08-25 DIAGNOSIS — T8454XD Infection and inflammatory reaction due to internal left knee prosthesis, subsequent encounter: Secondary | ICD-10-CM

## 2020-08-25 DIAGNOSIS — M25462 Effusion, left knee: Secondary | ICD-10-CM

## 2020-08-25 NOTE — Telephone Encounter (Signed)
Worked him in for this afternoon

## 2020-08-25 NOTE — Telephone Encounter (Signed)
Ok to work in today

## 2020-08-25 NOTE — Telephone Encounter (Signed)
Ok to work in? [place] schedule is booked too.

## 2020-08-25 NOTE — Progress Notes (Signed)
The patient is under 2 weeks out from surgery addressing a left prosthetic knee joint infection.  This ended up being a staph infection that was sensitive.  He is on IV antibiotics per PICC line.  He is seeing infectious disease as well.  He reports that he is feeling better overall and has very little pain.  He says the cellulitis is improving as is the swelling with his left leg.  His left knee incision looks good.  It is too early to remove the staples.  I tried to aspirate fluid from the knee but was unsuccessful.  He will continue to increase his activities but go slow.  I will see him back in just 1 week for staple removal from his left knee incision.  All question concerns were answered and addressed.

## 2020-08-25 NOTE — Patient Outreach (Signed)
[facility]) Care Management  08/25/2020  [name] 1955-09-25 [id number]   [facility] Unsuccessful outreach for post hospital/complex care referral  Referral Date: 08/19/20 Referral Source: [facility] hospital liaison Referral Reason:post hospital/complex care referral [place] Last cone admission 08/14/20 to 08/18/20 infection of total left knee replacement, left knee effusion   Outreach attempt to the home number  No answer. [facility] RN CM left HIPAA [facility] Portability and Accountability Act) compliant voicemail message along with CM's contact info  Past Medical History:  Diagnosis Date  . Arthritis   . Depression   . Diabetes mellitus without complication ([place]) 4360   Diabetes Type II  . GERD (gastroesophageal reflux disease)    diet controlled  . HTN (hypertension) 10/27/2018  . Hyperlipidemia   . Internal hemorrhoids 2014   noted on colonoscopy  . Osteoarthritis of both knees   . Prostate cancer [facility])    prostate  . Sigmoid diverticulosis 2014   Mild, noted on colonoscopy    Plan: [facility] RN CM scheduled this patient for a third call attempt within 4-7 business days Mailed unsuccessful outreach letter on 08/22/20   [place]. Lavina Hamman, RN, BSN, [place] Coordinator Office number [phone] Mobile number [phone]  Main [facility] number [phone] Fax number [phone]

## 2020-08-25 NOTE — Telephone Encounter (Signed)
Patient's wife called to schedule PO visit for this Thursday. Dr. Ninfa Linden is booked this week. Would like to be worked in on 6/9. Her call back number is [phone]

## 2020-08-26 ENCOUNTER — Encounter: Payer: Self-pay | Admitting: Infectious Disease

## 2020-08-29 ENCOUNTER — Telehealth: Payer: Self-pay

## 2020-08-29 ENCOUNTER — Other Ambulatory Visit: Payer: Self-pay | Admitting: Orthopaedic Surgery

## 2020-08-29 MED ORDER — FLUCONAZOLE 150 MG PO TABS: 150.0000 mg | ORAL_TABLET | Freq: Every day | ORAL | 0 refills | Status: DC

## 2020-08-29 NOTE — Telephone Encounter (Signed)
Please advise 

## 2020-08-29 NOTE — Telephone Encounter (Addendum)
Patients wife called she stated she spoke to the patients urologist and they think he has a yeast infection, wife stated he has bumps on/around his penis area , patient is requesting a rx for antibiotics to be sent to the pharmacy call back:[phone]

## 2020-08-29 NOTE — Telephone Encounter (Signed)
I spoke with patient and advised. 

## 2020-09-01 ENCOUNTER — Other Ambulatory Visit: Payer: Self-pay | Admitting: *Deleted

## 2020-09-01 ENCOUNTER — Ambulatory Visit: Payer: 59 | Admitting: Orthopaedic Surgery

## 2020-09-01 ENCOUNTER — Encounter: Payer: 59 | Admitting: Orthopaedic Surgery

## 2020-09-01 ENCOUNTER — Other Ambulatory Visit: Payer: Self-pay

## 2020-09-01 NOTE — Patient Outreach (Signed)
[facility]) Care Management  09/01/2020  [name] [birth year] [id number]   [facility] outreach for post Beard/complex care referral   Referral Date: 08/19/20 Referral Source: [name] [facility] liaison Referral Reason:post Beard/complex care referral [place] Last cone admission 08/14/20 to 08/18/20 infection of total left knee replacement, left knee effusion    Outreach successful at [phone]  Patient is able to verify HIPAA ([place]) identifiers Reviewed and addressed the purpose of the follow up call with the patient  Consent: [name] [place] [facility] RN CM reviewed [place] with patient. Patient gave verbal consent for services.   Initial assessment Transition of care assessment completed No needs identified social determinants of health (SDOH) assessed  No needs identified Left knee and PICC sites assessed and he only report some swelling but manageable Denies other signs of infection -drainage redness, pain etc He reports he has become comfortable with administering IV antibiotics Follow up visits scheduled and denies transportation assistance Agrees to follow up outreach  Provided [facility] [place] RN CM and 24 hour nurse numbers  Sent EMMI education via listed e= mail for hypertension, diabetes, type 2 and recovering from sepsis: full program  Patient Active Problem List   Diagnosis Date Noted   Effusion, left knee 08/14/2020   Infection of total left knee replacement ([place]) 08/14/2020   Status post total left knee replacement 03/21/2019   S/P carpal tunnel release right 02/22/19 03/08/2019   Carpal tunnel syndrome of right wrist    HTN (hypertension) 10/27/2018   Prostate cancer ([place]) 05/30/2017   Malignant neoplasm of prostate ([place]) 04/28/2017   Chronic pain of both knees 02/21/2017   Unilateral primary osteoarthritis, left knee 02/21/2017   Unilateral primary osteoarthritis, right knee  02/21/2017   Past Medical History:  Diagnosis Date   Arthritis    Depression    Diabetes mellitus without complication ([place]) 6948   Diabetes Type II   GERD (gastroesophageal reflux disease)    diet controlled   HTN (hypertension) 10/27/2018   Hyperlipidemia    Internal hemorrhoids 2014   noted on colonoscopy   Osteoarthritis of both knees    Prostate cancer ([place])    prostate   Sigmoid diverticulosis 2014   Mild, noted on colonoscopy     Plan: [facility] RN CM scheduled this patient for further outreach within 30 business days Pt encouraged to return a call to [facility] RN CM prn      Vickie Ponds L. Lavina Hamman, RN, BSN, [place] Coordinator Office number [phone] Mobile number [phone] Main [facility] number [phone] Fax number [phone]

## 2020-09-02 ENCOUNTER — Encounter: Payer: Self-pay | Admitting: Infectious Disease

## 2020-09-02 ENCOUNTER — Ambulatory Visit (INDEPENDENT_AMBULATORY_CARE_PROVIDER_SITE_OTHER): Payer: 59 | Admitting: Orthopaedic Surgery

## 2020-09-02 ENCOUNTER — Encounter: Payer: Self-pay | Admitting: Orthopaedic Surgery

## 2020-09-02 DIAGNOSIS — T8454XD Infection and inflammatory reaction due to internal left knee prosthesis, subsequent encounter: Secondary | ICD-10-CM

## 2020-09-02 MED ORDER — OXYCODONE HCL 5 MG PO TABS: 5.0000 mg | ORAL_TABLET | Freq: Four times a day (QID) | ORAL | 0 refills | Status: DC | PRN

## 2020-09-02 NOTE — Progress Notes (Signed)
The patient is now close to 3 weeks status post an irrigation debridement with synovectomy and polyliner exchange of an infected left total knee arthroplasty.  He still has a PICC line in place.  He is following up tomorrow with infectious disease.  He reports decreased pain and that he is doing well overall and reports improved motion and strength.  He is walking without assistive device.  Examination of his left knee looks much better today.  The swelling is minimal.  There is no redness.  I did remove the staples in place Steri-Strips.  He has improving motion and better pain tolerance.  He will continue to increase his activities as comfort allows.  I would like to see him back in 4 weeks for repeat exam.  I will refill his oxycodone because he is using this sparingly.

## 2020-09-03 ENCOUNTER — Ambulatory Visit (INDEPENDENT_AMBULATORY_CARE_PROVIDER_SITE_OTHER): Payer: 59 | Admitting: Infectious Disease

## 2020-09-03 ENCOUNTER — Encounter: Payer: Self-pay | Admitting: Infectious Disease

## 2020-09-03 ENCOUNTER — Other Ambulatory Visit: Payer: Self-pay

## 2020-09-03 VITALS — BP 122/79 | HR 67 | Wt 188.8 lb

## 2020-09-03 DIAGNOSIS — T8454XD Infection and inflammatory reaction due to internal left knee prosthesis, subsequent encounter: Secondary | ICD-10-CM | POA: Diagnosis not present

## 2020-09-03 DIAGNOSIS — I10 Essential (primary) hypertension: Secondary | ICD-10-CM | POA: Diagnosis not present

## 2020-09-03 DIAGNOSIS — A4901 Methicillin susceptible Staphylococcus aureus infection, unspecified site: Secondary | ICD-10-CM | POA: Diagnosis not present

## 2020-09-03 HISTORY — DX: Methicillin susceptible Staphylococcus aureus infection, unspecified site: A49.01

## 2020-09-03 MED ORDER — CEFADROXIL 1 G PO TABS: 1.0000 g | ORAL_TABLET | Freq: Two times a day (BID) | ORAL | 10 refills | Status: DC

## 2020-09-03 MED ORDER — RIFAMPIN 300 MG PO CAPS: 300.0000 mg | ORAL_CAPSULE | Freq: Two times a day (BID) | ORAL | 10 refills | Status: DC

## 2020-09-03 NOTE — Progress Notes (Signed)
c3e     Subjective:    Chief complaint : follow-up for prosthetic knee infection  Patient ID: [name], male    DOB: 09-01-1955, 65 y.o.   MRN: [id number]  HPI  65 y.o. male history of multiple medical problems including diabetes mellitus osteoarthritis who had a right knee replacement performed by Dr. Ninfa Linden in December 2020.  He has had multiple surgeries from urology including radical prostatectomy and bilateral lymph node excision, with the latter surgery leaving him with chronic lymphedema changes in his left leg.  He had apparently been treated with various antibiotics over the several months prior to position.     Ghe had been systemically ill with a concern for sepsis at 1 point in time.   Past week he had fairly acute onset of pain in his knee along with erythema that spread proximally and distally.  He had noticed an area that he described as looking like "ringworm on the top of his knee prior to this developing.   The knee became so excruciatingly painful that he was not able to move anymore.   He was seen by Dr. Ninfa Linden who aspirated amount of thick dark fluid from the knee.   Cultures have subsequently grown Staph aureus which turned out to be MSSA.   Patient has had an open arthrotomy with irrigation and debridement of the left knee with synovectomy and exchange of polyliner.  He is now on cefazolin which is taking 3 times a day along with oral rifampin twice daily and nearing completion of 6 weeks of therapy.  He says his pain in the knee is much improved he asked how his labs looked and if they showed that the infection was clearing.  I looked over labs from [place] and he has had a CBC and metabolic panel that are all within normal other than slightly depressed hematocrit there is no inflammatory markers present such as a sed rate or CRP for me to assess.    I would like to switch him over to oral cefadroxil along with rifampin at that point in  time.  Past Medical History:  Diagnosis Date   Arthritis    Depression    Diabetes mellitus without complication ([place]) 3888   Diabetes Type II   GERD (gastroesophageal reflux disease)    diet controlled   HTN (hypertension) 10/27/2018   Hyperlipidemia    Internal hemorrhoids 2014   noted on colonoscopy   Osteoarthritis of both knees    Prostate cancer [facility])    prostate   Sigmoid diverticulosis 2014   Mild, noted on colonoscopy    Past Surgical History:  Procedure Laterality Date   CARPAL TUNNEL RELEASE Right 02/22/2019   Procedure: CARPAL TUNNEL RELEASE;  Surgeon: Carole Civil, MD;  Location: [facility];  Service: Orthopedics;  Laterality: Right;   COLONOSCOPY N/A 09/13/2012   Procedure: COLONOSCOPY;  Surgeon: Danie Binder, MD;  Location: [facility];  Service: Endoscopy;  Laterality: N/A;  9:30 AM   CYSTOSCOPY WITH URETHRAL DILATATION N/A 06/02/2020   Procedure: CYSTOSCOPY WITH URETHRAL DILATATION;  Surgeon: [name], MD;  Location: [facility];  Service: Urology;  Laterality: N/A;  ONLY NEEDS 30 MIN   HERNIA REPAIR     20 years ago   I & D KNEE WITH POLY EXCHANGE Left 08/14/2020   Procedure: IRRIGATION AND DEBRIDEMENT LEFT KNEE with synovectomy WITH POLY EXCHANGE;  Surgeon: Mcarthur Rossetti, MD;  Location: [place];  Service: Orthopedics;  Laterality: Left;  KNEE ARTHROSCOPY Left    15 years ago   LYMPHADENECTOMY Bilateral 05/30/2017   Procedure: LYMPHADENECTOMY, PELVIC EXTENDED;  Surgeon: [name], MD;  Location: [facility];  Service: Urology;  Laterality: Bilateral;   ROBOT ASSISTED LAPAROSCOPIC RADICAL PROSTATECTOMY N/A 05/30/2017   Procedure: XI ROBOTIC ASSISTED LAPAROSCOPIC RADICAL PROSTATECTOMY LEVEL 3;  Surgeon: [name], MD;  Location: [facility];  Service: Urology;  Laterality: N/A;   SHOULDER ARTHROSCOPY Right    15 years ago   TOTAL KNEE ARTHROPLASTY Left 03/21/2019   Procedure: LEFT TOTAL KNEE ARTHROPLASTY;  Surgeon: Mcarthur Rossetti, MD;   Location: [facility];  Service: Orthopedics;  Laterality: Left;    Family History  Problem Relation Age of Onset   Cancer Mother        small cell lung      Social History   Socioeconomic History   Marital status: Married    Spouse name: Not on file   Number of children: 1   Years of education: Not on file   Highest education level: Not on file  Occupational History   Occupation: pastor  Tobacco Use   Smoking status: Never   Smokeless tobacco: Never  Vaping Use   Vaping Use: Never used  Substance and Sexual Activity   Alcohol use: No   Drug use: No   Sexual activity: Yes  Other Topics Concern   Not on file  Social History Narrative   [place]   Married to [employer]   Daughter, Abby Gigi Gin, RN III, Resides in [place]   Social Determinants of Health   Financial Resource Strain: Low Risk    Difficulty of Paying Living Expenses: Not hard at all  Food Insecurity: No Food Insecurity   Worried About Charity fundraiser in the Last Year: Never true   Arboriculturist in the Last Year: Never true  Transportation Needs: No Transportation Needs   Lack of Transportation (Medical): No   Lack of Transportation (Non-Medical): No  Physical Activity: Not on file  Stress: No Stress Concern Present   Feeling of Stress : Not at all  Social Connections: Not on file    No Known Allergies   Current Outpatient Medications:    fluconazole (DIFLUCAN) 150 MG tablet, Take 1 tablet (150 mg total) by mouth daily., Disp: 5 tablet, Rfl: 0   aspirin EC 325 MG tablet, Take 1 tablet (325 mg total) by mouth daily., Disp: 100 tablet, Rfl: 0   ceFAZolin (ANCEF) 2-4 GM/100ML-% IVPB, Inject 100 mLs (2 g total) into the vein every 8 (eight) hours., Disp: 40 each, Rfl: 1   ceFAZolin (ANCEF) IVPB, Inject 2 g into the vein every 8 (eight) hours. Indication:  prosthetic joint infection First Dose: No Last Day of Therapy:  09/26/2020 Labs - Once weekly:  CBC/D and BMP, Labs - Every other week:  ESR  and CRP Method of administration: IV Push Method of administration may be changed at the discretion of home infusion pharmacist based upon assessment of the patient and/or caregiver's ability to self-administer the medication ordered., Disp: 41 Units, Rfl: 0   Continuous Blood Gluc Sensor (FREESTYLE LIBRE 14 DAY SENSOR) MISC, SMARTSIG:Topical Every 10 Days, Disp: , Rfl:    dexamethasone (DECADRON) 2 MG tablet, Take 2 mg by mouth at bedtime., Disp: , Rfl:    diclofenac (VOLTAREN) 75 MG EC tablet, TAKE 1 TABLET BY MOUTH 2 TIMES DAILY BETWEEN MEALS AS NEEDED. (Patient not taking: No sig reported), Disp: 60 tablet, Rfl:  3   docusate sodium (COLACE) 100 MG capsule, TAKE 1 CAPSULE BY MOUTH TWICE A DAY, Disp: 10 capsule, Rfl: 0   escitalopram (LEXAPRO) 10 MG tablet, Take 10 mg by mouth daily., Disp: , Rfl:    insulin aspart (NOVOLOG) 100 UNIT/ML injection, Inject 8 Units into the skin 3 (three) times daily with meals., Disp: , Rfl:    losartan-hydrochlorothiazide (HYZAAR) 50-12.5 MG tablet, TAKE 1 TABLET BY MOUTH EVERY DAY, Disp: 90 tablet, Rfl: 1   methocarbamol (ROBAXIN) 500 MG tablet, Take 1 tablet (500 mg total) by mouth every 6 (six) hours as needed for muscle spasms., Disp: 30 tablet, Rfl: 1   naproxen sodium (ALEVE) 220 MG tablet, Take 220-440 mg by mouth daily as needed (pain). , Disp: , Rfl:    Omega-3 Fatty Acids (FISH OIL MAXIMUM STRENGTH) 1200 MG CAPS, Take 1 capsule by mouth daily., Disp: , Rfl:    oxyCODONE (OXY IR/ROXICODONE) 5 MG immediate release tablet, Take 1 tablet (5 mg total) by mouth every 6 (six) hours as needed for moderate pain (pain score 4-6)., Disp: 40 tablet, Rfl: 0   rifampin (RIFADIN) 300 MG capsule, Take 1 capsule (300 mg total) by mouth 2 (two) times daily., Disp: 78 capsule, Rfl: 0   rifampin (RIFADIN) 300 MG capsule, Take 1 capsule (300 mg total) by mouth every 12 (twelve) hours., Disp: 60 capsule, Rfl: 6   rosuvastatin (CRESTOR) 20 MG tablet, TAKE 1 TABLET BY MOUTH EVERY  DAY (Patient taking differently: Take 20 mg by mouth daily.), Disp: 90 tablet, Rfl: 1   TRESIBA FLEXTOUCH 100 UNIT/ML SOPN FlexTouch Pen, Inject 64 Units into the skin daily., Disp: , Rfl:    Review of Systems  Constitutional:  Negative for chills and fever.  HENT:  Negative for congestion and sore throat.   Eyes:  Negative for photophobia.  Respiratory:  Negative for cough, choking, chest tightness, shortness of breath and wheezing.   Cardiovascular:  Negative for chest pain, palpitations and leg swelling.  Gastrointestinal:  Negative for abdominal pain, blood in stool, constipation, diarrhea, nausea and vomiting.  Genitourinary:  Negative for dysuria, flank pain, frequency and hematuria.  Musculoskeletal:  Negative for back pain and myalgias.  Skin:  Negative for rash.  Neurological:  Negative for dizziness, weakness and headaches.  Hematological:  Does not bruise/bleed easily.  Psychiatric/Behavioral:  Negative for agitation, behavioral problems, confusion, dysphoric mood and suicidal ideas. The patient is not hyperactive.       Objective:   Physical Exam Constitutional:      General: He is not in acute distress.    Appearance: Normal appearance. He is well-developed. He is not ill-appearing or diaphoretic.  HENT:     Head: Normocephalic and atraumatic.     Right Ear: Hearing and external ear normal.     Left Ear: Hearing and external ear normal.     Nose: No nasal deformity or rhinorrhea.  Eyes:     General: No scleral icterus.    Conjunctiva/sclera: Conjunctivae normal.     Right eye: Right conjunctiva is not injected.     Left eye: Left conjunctiva is not injected.     Pupils: Pupils are equal, round, and reactive to light.  Neck:     Vascular: No JVD.  Cardiovascular:     Rate and Rhythm: Normal rate and regular rhythm.     Heart sounds: Normal heart sounds, S1 normal and S2 normal. No murmur heard.   No friction rub.  Pulmonary:  Effort: Pulmonary effort is  normal.     Breath sounds: Normal breath sounds.  Abdominal:     General: Bowel sounds are normal. There is no distension.     Palpations: Abdomen is soft.     Tenderness: There is no abdominal tenderness.  Musculoskeletal:        General: Normal range of motion.     Right shoulder: Normal.     Left shoulder: Normal.     Cervical back: Normal range of motion and neck supple.     Right hip: Normal.     Left hip: Normal.     Right knee: Normal.     Left knee: Normal.  Lymphadenopathy:     Head:     Right side of head: No submandibular, preauricular or posterior auricular adenopathy.     Left side of head: No submandibular, preauricular or posterior auricular adenopathy.     Cervical: No cervical adenopathy.     Right cervical: No superficial or deep cervical adenopathy.    Left cervical: No superficial or deep cervical adenopathy.  Skin:    General: Skin is warm and dry.     Coloration: Skin is not pale.     Findings: No abrasion, bruising, ecchymosis, erythema, lesion or rash.     Nails: There is no clubbing.  Neurological:     General: No focal deficit present.     Mental Status: He is alert and oriented to person, place, and time.     Sensory: No sensory deficit.     Coordination: Coordination normal.     Gait: Gait normal.  Psychiatric:        Attention and Perception: He is attentive.        Speech: Speech normal.        Behavior: Behavior normal. Behavior is cooperative.        Thought Content: Thought content normal.        Judgment: Judgment normal.          Assessment & Plan:   PJI: complete 6 weeks of cefazolin and oral rifampin and then changed to rifampin and cefadroxil to push for 6 months to a year of therapy  I spent more than 40 minutes with the patient including greater than 50% of time in face to face counseling of the patient personally reviewing radiographs, along with pertinent laboratory microbiological data review of medical records and in  coordination of his care.

## 2020-09-08 ENCOUNTER — Telehealth: Payer: Self-pay

## 2020-09-08 NOTE — Telephone Encounter (Signed)
Received call from patient's wife wanting to confirm antibiotic therapy. Relayed per Dr. Lucianne Lei Dam's last note, patient is to complete 6 weeks of cefazolin and oral rifampin and then change to oral rifampin and cefadroxil. Wife verbalized understanding.   Patient's wife is asking for provider to review home health labs and reach out with any concerns.   Beryle Flock, RN

## 2020-09-08 NOTE — Telephone Encounter (Signed)
Had pharmacist review labs (CBC, CMET), she states there is nothing of concern. Relayed this to patient's wife. Spoke with Amy at [facility], they do not have a CRP or ESR result on file. Confirmed that they do have orders for weekly CRP and ESR, but nursing has not drawn them. Amy states she will reiterate to nursing that these need to be drawn.   Beryle Flock, RN

## 2020-09-10 ENCOUNTER — Telehealth: Payer: Self-pay

## 2020-09-10 ENCOUNTER — Telehealth: Payer: Self-pay | Admitting: Orthopaedic Surgery

## 2020-09-10 NOTE — Telephone Encounter (Signed)
Called and advised. She stated she would call them

## 2020-09-10 NOTE — Telephone Encounter (Signed)
Pts wife Diane called stating the pts antibiotics has been making him sick and she would like a CB to discuss what they can try to avoid the pt getting sicker.  [phone]

## 2020-09-10 NOTE — Telephone Encounter (Addendum)
Dr Megan Salon, can I add him to your morning schedule 09/11/20?  Landis Gandy, RN

## 2020-09-10 NOTE — Telephone Encounter (Signed)
Loss of appitite is getting worse as is nausea. Wife was wondering if we can add something to help increase appetite since loss of 10 pounds in last few weeks. Also will need nausea medication to help with that. [facility]. No vomiting and no diarrhea or fever at this time.

## 2020-09-11 ENCOUNTER — Other Ambulatory Visit: Payer: Self-pay

## 2020-09-11 ENCOUNTER — Ambulatory Visit (INDEPENDENT_AMBULATORY_CARE_PROVIDER_SITE_OTHER): Payer: 59 | Admitting: Internal Medicine

## 2020-09-11 ENCOUNTER — Encounter: Payer: Self-pay | Admitting: Internal Medicine

## 2020-09-11 DIAGNOSIS — R110 Nausea: Secondary | ICD-10-CM

## 2020-09-11 DIAGNOSIS — R112 Nausea with vomiting, unspecified: Secondary | ICD-10-CM | POA: Insufficient documentation

## 2020-09-11 MED ORDER — ONDANSETRON HCL 4 MG PO TABS: 4.0000 mg | ORAL_TABLET | Freq: Three times a day (TID) | ORAL | 1 refills | Status: DC | PRN

## 2020-09-11 NOTE — Telephone Encounter (Signed)
Spoke with patient. Added phone visit with Dr Megan Salon today at [facility], Lanice Schwab, RN

## 2020-09-11 NOTE — Progress Notes (Signed)
Virtual Visit via Telephone Note  I connected with [name] on 09/11/20 at 10:00 AM EDT by telephone and verified that I am speaking with the correct person using two identifiers.  Location: Patient: Home Provider: [facility]   I discussed the limitations, risks, security and privacy concerns of performing an evaluation and management service by telephone and the availability of in person appointments. I also discussed with the patient that there may be a patient responsible charge related to this service. The patient expressed understanding and agreed to proceed.   History of Present Illness: Mr. Mccollum called yesterday stating that he was having problems with severe nausea and anorexia.  I called him today.  He says that this began shortly after he started on IV cefazolin and oral rifampin for his MSSA prosthetic knee infection.  He has been gradually worsening.  He says that he has lost about 10 pounds.  He "cannot stand to look at food".  He says that he is very weak, bloated and dizzy.  He has not had any fever, vomiting or diarrhea.   Observations/Objective:   Assessment and Plan: I suspect that he is having an adverse reaction to rifampin.  I instructed him to stop rifampin now and continue IV cefazolin alone.  I have sent in a prescription for ondansetron to take as needed for nausea.  Follow Up Instructions: Discontinue rifampin Continue IV cefazolin Ondansetron as needed I asked him to call us if he is not feeling better within the next 2 to 3 days   I discussed the assessment and treatment plan with the patient. The patient was provided an opportunity to ask questions and all were answered. The patient agreed with the plan and demonstrated an understanding of the instructions.   The patient was advised to call back or seek an in-person evaluation if the symptoms worsen or if the condition fails to improve as anticipated.  I provided 15 minutes of non-face-to-face time  during this encounter.   Michel Bickers, MD

## 2020-09-12 ENCOUNTER — Telehealth: Payer: Self-pay

## 2020-09-12 NOTE — Telephone Encounter (Addendum)
Patient's wife called stating patient is still having a lot of nausea and unable to eat or drink a lot. Patient has stopped taking the Rifampin and tried taking the zofran with little relief. Patient also still running fevers around 101. Patient's wife has  been instructed if she feels the patient not having any improvement then she will need to take him to the ED. Patient's wife agreed and feels he is becoming dehydrated and still not drinking enough fluids.  Avonna Iribe T Brooks Sailors

## 2020-09-17 NOTE — Telephone Encounter (Signed)
Called patient, no answer. Left HIPAA compliant voicemail requesting callback.   Beryle Flock, RN

## 2020-09-17 NOTE — Telephone Encounter (Signed)
Can someone reach out to the patient and give him the message below

## 2020-09-17 NOTE — Telephone Encounter (Signed)
I spoke to patient today and he states he is now feeling a lot better. Patient states the Zofran is working and he is still off of the Rifampin. Do you want him to remain off of the Rifampin and does he need to still have phone visit? Alfonso Carden T Brooks Sailors

## 2020-09-18 ENCOUNTER — Encounter: Payer: Self-pay | Admitting: Infectious Diseases

## 2020-09-23 ENCOUNTER — Telehealth: Payer: Self-pay

## 2020-09-23 NOTE — Telephone Encounter (Signed)
Received voicemail from patient's wife requesting a call return. Call returned, left message to call the office if needed. [name]

## 2020-09-24 ENCOUNTER — Telehealth: Payer: Self-pay | Admitting: *Deleted

## 2020-09-24 ENCOUNTER — Other Ambulatory Visit: Payer: Self-pay | Admitting: Orthopaedic Surgery

## 2020-09-24 NOTE — Telephone Encounter (Signed)
Patient informed and verbalized understanding. Patient aware to start the Cefadroxil after completing IV antibiotics

## 2020-09-24 NOTE — Telephone Encounter (Signed)
Patient's wife called. [name] is feeling much better since stopping rifampin. They are wondering what the most recent labs showed and if he is still supposed to stop IV antibiotics 09/25/20 and start oral cefadroxil 09/26/20. No labs available in [place], RN requested from [place] [phone]). Please advise. Landis Gandy, RN

## 2020-09-24 NOTE — Telephone Encounter (Signed)
Patient advised to remain off of the rifampin and follow up with Dr. Tommy Medal. Patient also is asking if IV antibiotics should be ending on 7/8 as scheduled or should it be extended until his follow up with Dr. Tommy Medal on 7/15. Lamaria Hildebrandt T Brooks Sailors

## 2020-09-26 ENCOUNTER — Encounter: Payer: Self-pay | Admitting: Infectious Disease

## 2020-09-30 ENCOUNTER — Other Ambulatory Visit: Payer: Self-pay

## 2020-09-30 ENCOUNTER — Encounter: Payer: Self-pay | Admitting: Orthopaedic Surgery

## 2020-09-30 ENCOUNTER — Ambulatory Visit (INDEPENDENT_AMBULATORY_CARE_PROVIDER_SITE_OTHER): Payer: 59 | Admitting: Orthopaedic Surgery

## 2020-09-30 DIAGNOSIS — Z96652 Presence of left artificial knee joint: Secondary | ICD-10-CM

## 2020-09-30 DIAGNOSIS — T8454XD Infection and inflammatory reaction due to internal left knee prosthesis, subsequent encounter: Secondary | ICD-10-CM

## 2020-09-30 NOTE — Progress Notes (Signed)
The patient is now about 7 weeks out from an open irrigation and debridement with synovectomy and polyliner exchange of an infected left total knee arthroplasty.  It was felt that this was an acute infection.  He has been on IV antibiotics and just finished that recently and is now on oral antibiotics.  He is followed closely by the infectious disease clinic.  He does continue to have lymphedema in his left leg and that goes down in the morning and has been using a lymphedema machine.  He says the knee is not hurting and is doing well in terms of his left knee.  The left knee does have a mild effusion today but has a painful arc of motion of the knee.  The incision is well-healed there is no drainage.  There is no redness.  There is lymphedema in his left leg but it is less than what I have seen.  He will continue his oral antibiotics.  If things worsen in any way I want to know about it and he knows to contact us.  I would like to see him back in 3 months with a repeat AP and lateral of the left knee.  If there are issues before then again he will contact us.  All questions and concerns were answered and addressed.

## 2020-10-02 ENCOUNTER — Other Ambulatory Visit: Payer: Self-pay | Admitting: *Deleted

## 2020-10-02 ENCOUNTER — Other Ambulatory Visit: Payer: Self-pay

## 2020-10-02 NOTE — Patient Outreach (Signed)
[facility]) Care Management  10/02/2020  Jigar Zielke 05/04/1955 [id number]  [facility] follow up outreach to complex care patient referred post hospital with case closure Referral Date: 08/19/20 Referral Source: [facility] hospital liaison Referral Reason:post hospital/complex care referral [place] [facility] admission 08/14/20 to 08/18/20 infection of total left knee replacement, left knee effusion    Outreach successful at [phone]   Patient is able to verify HIPAA ([place]) identifiers Reviewed and addressed the purpose of the follow up call with the patient   Consent: [facility] ([place]) RN CM reviewed [place] with patient. Patient gave verbal consent for services.   Assessment Mr Pettengill reports he is doing very well with lots of improvement He confirms his PICC line was removed and he is more active, driving  New antibiotic Cefadroxil 1 g bid started with some nausea, no diarrhea He did take nausea medications today He confirms he took the medicine on an empty stomach today Suggest he take with food and he reports he will start taking with food and report to MD if nausea continues   Diabetes He reports he is no longer having cbg values in the 400 but are now well below 200  He reports he feels he has his diabetes now under control Last Hg A1c noted in EPIC ws 9.0 on 08/14/20 Has been 9.1 on 05/19/20, to 7.1 on 05/26/17, 7.2 on 03/18/21  [facility] progression of care discussed with options and he voices his preference of [facility]  case closure with the option of outreaching to RN CM as needed offered [facility] disease management services to continue to work with lowering HgA1c but not preferred He voiced appreciation for the outreaches and services rendered Patient Active Problem List   Diagnosis Date Noted   Nausea 09/11/2020   MSSA (methicillin susceptible Staphylococcus aureus) infection 09/03/2020    Effusion, left knee 08/14/2020   Infection of total left knee replacement ([place]) 08/14/2020   Status post total left knee replacement 03/21/2019   S/P carpal tunnel release right 02/22/19 03/08/2019   Carpal tunnel syndrome of right wrist    HTN (hypertension) 10/27/2018   Prostate cancer ([place]) 05/30/2017   Malignant neoplasm of prostate ([place]) 04/28/2017   Chronic pain of both knees 02/21/2017   Unilateral primary osteoarthritis, left knee 02/21/2017   Unilateral primary osteoarthritis, right knee 02/21/2017   Past Medical History:  Diagnosis Date   Arthritis    Depression    Diabetes mellitus without complication ([place]) 2633   Diabetes Type II   GERD (gastroesophageal reflux disease)    diet controlled   HTN (hypertension) 10/27/2018   Hyperlipidemia    Internal hemorrhoids 2014   noted on colonoscopy   MSSA (methicillin susceptible Staphylococcus aureus) infection 09/03/2020   Osteoarthritis of both knees    Prostate cancer ([place])    prostate   Sigmoid diverticulosis 2014   Mild, noted on colonoscopy    Plan [facility] Complex care case closure Pt encouraged to return a call to [facility] RN CM prn   Kieana Livesay L. Lavina Hamman, RN, BSN, [place] Coordinator Office number [phone] Main [facility] number [phone] Fax number [phone]

## 2020-10-03 ENCOUNTER — Telehealth: Payer: Self-pay

## 2020-10-03 ENCOUNTER — Ambulatory Visit (INDEPENDENT_AMBULATORY_CARE_PROVIDER_SITE_OTHER): Payer: 59 | Admitting: Infectious Disease

## 2020-10-03 ENCOUNTER — Encounter: Payer: Self-pay | Admitting: Infectious Disease

## 2020-10-03 ENCOUNTER — Other Ambulatory Visit: Payer: Self-pay

## 2020-10-03 VITALS — BP 126/78 | HR 59 | Temp 98.0°F | Wt 190.0 lb

## 2020-10-03 DIAGNOSIS — R110 Nausea: Secondary | ICD-10-CM

## 2020-10-03 DIAGNOSIS — A4901 Methicillin susceptible Staphylococcus aureus infection, unspecified site: Secondary | ICD-10-CM

## 2020-10-03 DIAGNOSIS — I10 Essential (primary) hypertension: Secondary | ICD-10-CM | POA: Diagnosis not present

## 2020-10-03 DIAGNOSIS — R112 Nausea with vomiting, unspecified: Secondary | ICD-10-CM

## 2020-10-03 DIAGNOSIS — T8454XD Infection and inflammatory reaction due to internal left knee prosthesis, subsequent encounter: Secondary | ICD-10-CM | POA: Diagnosis not present

## 2020-10-03 MED ORDER — ONDANSETRON HCL 4 MG PO TABS: 4.0000 mg | ORAL_TABLET | Freq: Three times a day (TID) | ORAL | 2 refills | Status: DC | PRN

## 2020-10-03 NOTE — Telephone Encounter (Signed)
Patient states that he had labs done last weekend before PICC removal. Called [place] (with [place]) left VM - asking to send lab results over from 09/26/2020 if any was done.    [place], CMA

## 2020-10-03 NOTE — Progress Notes (Signed)
c3e     Subjective:    Chief complaint : follow-up for prosthetic knee infection  Patient ID: [name], male    DOB: 02/03/1956, 65 y.o.   MRN: [id number]  HPI  65 y.o. male history of multiple medical problems including diabetes mellitus osteoarthritis who had a right knee replacement performed by Dr. Ninfa Linden in December 2020.  He has had multiple surgeries from urology including radical prostatectomy and bilateral lymph node excision, with the latter surgery leaving him with chronic lymphedema changes in his left leg.     He then had fairly abrupt acute onset of pain in his knee along with erythema that spread proximally and distally.  He had noticed an area that he described as looking like "ringworm on the top of his knee prior to this developing.   The knee became so excruciatingly painful that he was not able to move anymore.   He was seen by Dr. Ninfa Linden who aspirated amount of thick dark fluid from the knee.   Cultures subsequently grew Staph aureus which turned out to be MSSA.   Patient has had an open arthrotomy with irrigation and debridement of the left knee with synovectomy and exchange of polyliner.  He was narrowed to cefazolin  3 times a day along with oral rifampin twice daily  Is having severe nausea and vomiting while on rifampin and saw my partner Dr. Megan Salon virtually.  Rifampin was stopped with improvement in his nausea.  Nausea did persist a little bit though while he was still on IV cefazolin but then went away he tells me once he had stopped IV cefazolin and switched over to cefadroxil twice daily.  It sounds like he is not taking the correct dose of cefadroxil as he is taking 2 tablets twice a day rather than 1 g twice a day which is how the prescription reads.  Knee pain is dramatically improved.  He also seen Dr. Ninfa Linden in follow-up he was happy with his progress  Past Medical History:  Diagnosis Date   Arthritis    Depression     Diabetes mellitus without complication ([place]) 5277   Diabetes Type II   GERD (gastroesophageal reflux disease)    diet controlled   HTN (hypertension) 10/27/2018   Hyperlipidemia    Internal hemorrhoids 2014   noted on colonoscopy   MSSA (methicillin susceptible Staphylococcus aureus) infection 09/03/2020   Osteoarthritis of both knees    Prostate cancer [facility])    prostate   Sigmoid diverticulosis 2014   Mild, noted on colonoscopy    Past Surgical History:  Procedure Laterality Date   CARPAL TUNNEL RELEASE Right 02/22/2019   Procedure: CARPAL TUNNEL RELEASE;  Surgeon: Carole Civil, MD;  Location: [facility] ORS;  Service: Orthopedics;  Laterality: Right;   COLONOSCOPY N/A 09/13/2012   Procedure: COLONOSCOPY;  Surgeon: Danie Binder, MD;  Location: [facility] ENDO SUITE;  Service: Endoscopy;  Laterality: N/A;  9:30 AM   CYSTOSCOPY WITH URETHRAL DILATATION N/A 06/02/2020   Procedure: CYSTOSCOPY WITH URETHRAL DILATATION;  Surgeon: [name], MD;  Location: [facility];  Service: Urology;  Laterality: N/A;  ONLY NEEDS 30 MIN   HERNIA REPAIR     20 years ago   I & D KNEE WITH POLY EXCHANGE Left 08/14/2020   Procedure: IRRIGATION AND DEBRIDEMENT LEFT KNEE with synovectomy WITH POLY EXCHANGE;  Surgeon: Mcarthur Rossetti, MD;  Location: [place];  Service: Orthopedics;  Laterality: Left;   KNEE ARTHROSCOPY Left    15  years ago   LYMPHADENECTOMY Bilateral 05/30/2017   Procedure: LYMPHADENECTOMY, PELVIC EXTENDED;  Surgeon: [name], MD;  Location: [facility];  Service: Urology;  Laterality: Bilateral;   ROBOT ASSISTED LAPAROSCOPIC RADICAL PROSTATECTOMY N/A 05/30/2017   Procedure: XI ROBOTIC ASSISTED LAPAROSCOPIC RADICAL PROSTATECTOMY LEVEL 3;  Surgeon: [name], MD;  Location: [facility];  Service: Urology;  Laterality: N/A;   SHOULDER ARTHROSCOPY Right    15 years ago   TOTAL KNEE ARTHROPLASTY Left 03/21/2019   Procedure: LEFT TOTAL KNEE ARTHROPLASTY;  Surgeon: Mcarthur Rossetti, MD;  Location:  [facility];  Service: Orthopedics;  Laterality: Left;    Family History  Problem Relation Age of Onset   Cancer Mother        small cell lung      Social History   Socioeconomic History   Marital status: Married    Spouse name: Not on file   Number of children: 1   Years of education: Not on file   Highest education level: Not on file  Occupational History   Occupation: pastor  Tobacco Use   Smoking status: Never   Smokeless tobacco: Never  Vaping Use   Vaping Use: Never used  Substance and Sexual Activity   Alcohol use: No   Drug use: No   Sexual activity: Yes  Other Topics Concern   Not on file  Social History Narrative   Doristine [place]   Married to [employer]   Daughter, Abby Gigi Gin, RN III, Resides in [place]   Social Determinants of Health   Financial Resource Strain: Low Risk    Difficulty of Paying Living Expenses: Not hard at all  Food Insecurity: No Food Insecurity   Worried About Charity fundraiser in the Last Year: Never true   Arboriculturist in the Last Year: Never true  Transportation Needs: No Transportation Needs   Lack of Transportation (Medical): No   Lack of Transportation (Non-Medical): No  Physical Activity: Not on file  Stress: No Stress Concern Present   Feeling of Stress : Not at all  Social Connections: Not on file    No Known Allergies   Current Outpatient Medications:    aspirin EC 325 MG tablet, Take 1 tablet (325 mg total) by mouth daily., Disp: 100 tablet, Rfl: 0   cefadroxil (DURICEF) 1 g tablet, Take 1 tablet (1 g total) by mouth 2 (two) times daily., Disp: 60 tablet, Rfl: 10   ceFAZolin (ANCEF) 2-4 GM/100ML-% IVPB, Inject 100 mLs (2 g total) into the vein every 8 (eight) hours., Disp: 40 each, Rfl: 1   Continuous Blood Gluc Sensor (FREESTYLE LIBRE 14 DAY SENSOR) MISC, SMARTSIG:Topical Every 10 Days, Disp: , Rfl:    dexamethasone (DECADRON) 2 MG tablet, Take 2 mg by mouth at bedtime., Disp: , Rfl:    diclofenac (VOLTAREN)  75 MG EC tablet, TAKE 1 TABLET BY MOUTH 2 TIMES DAILY BETWEEN MEALS AS NEEDED., Disp: 60 tablet, Rfl: 3   docusate sodium (COLACE) 100 MG capsule, TAKE 1 CAPSULE BY MOUTH TWICE A DAY, Disp: 10 capsule, Rfl: 0   escitalopram (LEXAPRO) 10 MG tablet, Take 10 mg by mouth daily., Disp: , Rfl:    fluconazole (DIFLUCAN) 150 MG tablet, Take 1 tablet (150 mg total) by mouth daily., Disp: 5 tablet, Rfl: 0   insulin aspart (NOVOLOG) 100 UNIT/ML injection, Inject 8 Units into the skin 3 (three) times daily with meals., Disp: , Rfl:    losartan-hydrochlorothiazide (HYZAAR) 50-12.5 MG tablet, TAKE 1  TABLET BY MOUTH EVERY DAY, Disp: 90 tablet, Rfl: 1   methocarbamol (ROBAXIN) 500 MG tablet, Take 1 tablet (500 mg total) by mouth every 6 (six) hours as needed for muscle spasms., Disp: 30 tablet, Rfl: 1   naproxen sodium (ALEVE) 220 MG tablet, Take 220-440 mg by mouth daily as needed (pain). , Disp: , Rfl:    Omega-3 Fatty Acids (FISH OIL MAXIMUM STRENGTH) 1200 MG CAPS, Take 1 capsule by mouth daily., Disp: , Rfl:    ondansetron (ZOFRAN) 4 MG tablet, Take 1 tablet (4 mg total) by mouth every 8 (eight) hours as needed for nausea or vomiting., Disp: 20 tablet, Rfl: 1   oxyCODONE (OXY IR/ROXICODONE) 5 MG immediate release tablet, Take 1 tablet (5 mg total) by mouth every 6 (six) hours as needed for moderate pain (pain score 4-6)., Disp: 40 tablet, Rfl: 0   rifampin (RIFADIN) 300 MG capsule, TAKE 1 CAPSULE BY MOUTH TWICE A DAY, Disp: 78 capsule, Rfl: 1   rosuvastatin (CRESTOR) 20 MG tablet, TAKE 1 TABLET BY MOUTH EVERY DAY (Patient taking differently: Take 20 mg by mouth daily.), Disp: 90 tablet, Rfl: 1   TRESIBA FLEXTOUCH 100 UNIT/ML SOPN FlexTouch Pen, Inject 64 Units into the skin daily., Disp: , Rfl:    Review of Systems  Constitutional:  Negative for chills and fever.  HENT:  Negative for congestion and sore throat.   Eyes:  Negative for photophobia.  Respiratory:  Negative for cough, choking, chest tightness,  shortness of breath and wheezing.   Cardiovascular:  Negative for chest pain, palpitations and leg swelling.  Gastrointestinal:  Negative for abdominal pain, blood in stool, constipation, diarrhea, nausea and vomiting.  Genitourinary:  Negative for decreased urine volume, dysuria, flank pain, frequency, hematuria, penile discharge, penile swelling and testicular pain.  Musculoskeletal:  Positive for arthralgias. Negative for back pain and myalgias.  Skin:  Negative for color change, pallor, rash and wound.  Neurological:  Negative for dizziness, weakness and headaches.  Hematological:  Does not bruise/bleed easily.  Psychiatric/Behavioral:  Negative for agitation, behavioral problems, confusion, dysphoric mood and suicidal ideas. The patient is not hyperactive.       Knee 10/03/2020:     Objective:   Physical Exam Constitutional:      General: He is not in acute distress.    Appearance: Normal appearance. He is well-developed. He is not ill-appearing or diaphoretic.  HENT:     Head: Normocephalic and atraumatic.     Right Ear: Hearing and external ear normal.     Left Ear: Hearing and external ear normal.     Nose: No nasal deformity or rhinorrhea.  Eyes:     General: No scleral icterus.    Conjunctiva/sclera: Conjunctivae normal.     Right eye: Right conjunctiva is not injected.     Left eye: Left conjunctiva is not injected.     Pupils: Pupils are equal, round, and reactive to light.  Neck:     Vascular: No JVD.  Cardiovascular:     Rate and Rhythm: Normal rate and regular rhythm.     Heart sounds: S1 normal and S2 normal.  Pulmonary:     Effort: Pulmonary effort is normal. No respiratory distress.     Breath sounds: No wheezing.  Abdominal:     General: There is no distension.     Palpations: Abdomen is soft.  Musculoskeletal:        General: Normal range of motion.     Right shoulder: Normal.  Left shoulder: Normal.     Cervical back: Normal range of motion and  neck supple.     Right hip: Normal.     Left hip: Normal.     Right knee: Normal.     Left knee: Normal.  Lymphadenopathy:     Head:     Right side of head: No submandibular, preauricular or posterior auricular adenopathy.     Left side of head: No submandibular, preauricular or posterior auricular adenopathy.     Cervical: No cervical adenopathy.     Right cervical: No superficial or deep cervical adenopathy.    Left cervical: No superficial or deep cervical adenopathy.  Skin:    General: Skin is warm and dry.     Coloration: Skin is not pale.     Findings: No abrasion, bruising, ecchymosis, erythema, lesion or rash.     Nails: There is no clubbing.  Neurological:     General: No focal deficit present.     Mental Status: He is alert and oriented to person, place, and time. Mental status is at baseline.     Sensory: No sensory deficit.     Coordination: Coordination normal.     Gait: Gait normal.  Psychiatric:        Attention and Perception: He is attentive.        Mood and Affect: Mood normal.        Speech: Speech normal.        Behavior: Behavior normal. Behavior is cooperative.        Thought Content: Thought content normal.        Judgment: Judgment normal.          Assessment & Plan:   Left aesthetic knee infection: Check labs today and continue continue cefadroxil twice daily.  Nausea has resolved with stopping rifampin and IV cefazolin  I spent more than 30 minutes with the patient including face to face counseling of the patient personally reviewing radiographs, along with pertinent laboratory microbiological,  data review of medical records before and during the visit and in coordination of his care.

## 2020-10-04 LAB — CBC WITH DIFFERENTIAL/PLATELET
Absolute Monocytes: 512 cells/uL (ref 200–950)
Basophils Absolute: 28 cells/uL (ref 0–200)
Basophils Relative: 0.5 %
Eosinophils Absolute: 330 cells/uL (ref 15–500)
Eosinophils Relative: 6 %
HCT: 43.3 % (ref 38.5–50.0)
Hemoglobin: 14.4 g/dL (ref 13.2–17.1)
Lymphs Abs: 1612 cells/uL (ref 850–3900)
MCH: 29.6 pg (ref 27.0–33.0)
MCHC: 33.3 g/dL (ref 32.0–36.0)
MCV: 89.1 fL (ref 80.0–100.0)
MPV: 9.6 fL (ref 7.5–12.5)
Monocytes Relative: 9.3 %
Neutro Abs: 3020 cells/uL (ref 1500–7800)
Neutrophils Relative %: 54.9 %
Platelets: 171 10*3/uL (ref 140–400)
RBC: 4.86 10*6/uL (ref 4.20–5.80)
RDW: 14.4 % (ref 11.0–15.0)
Total Lymphocyte: 29.3 %
WBC: 5.5 10*3/uL (ref 3.8–10.8)

## 2020-10-04 LAB — COMPLETE METABOLIC PANEL WITH GFR
AG Ratio: 1.5 (calc) (ref 1.0–2.5)
ALT: 16 U/L (ref 9–46)
AST: 23 U/L (ref 10–35)
Albumin: 4.4 g/dL (ref 3.6–5.1)
Alkaline phosphatase (APISO): 61 U/L (ref 35–144)
BUN: 20 mg/dL (ref 7–25)
CO2: 30 mmol/L (ref 20–32)
Calcium: 9.6 mg/dL (ref 8.6–10.3)
Chloride: 103 mmol/L (ref 98–110)
Creat: 0.89 mg/dL (ref 0.70–1.35)
Globulin: 2.9 g/dL (calc) (ref 1.9–3.7)
Glucose, Bld: 77 mg/dL (ref 65–99)
Potassium: 4.9 mmol/L (ref 3.5–5.3)
Sodium: 140 mmol/L (ref 135–146)
Total Bilirubin: 0.5 mg/dL (ref 0.2–1.2)
Total Protein: 7.3 g/dL (ref 6.1–8.1)
eGFR: 96 mL/min/{1.73_m2} (ref >=60)

## 2020-10-04 LAB — SEDIMENTATION RATE: Sed Rate: 9 mm/h (ref 0–20)

## 2020-10-10 ENCOUNTER — Other Ambulatory Visit (HOSPITAL_COMMUNITY): Payer: Self-pay | Admitting: Urology

## 2020-10-10 DIAGNOSIS — C61 Malignant neoplasm of prostate: Secondary | ICD-10-CM

## 2020-10-13 ENCOUNTER — Other Ambulatory Visit: Payer: Self-pay | Admitting: Orthopaedic Surgery

## 2020-10-13 ENCOUNTER — Telehealth: Payer: Self-pay | Admitting: Orthopaedic Surgery

## 2020-10-13 MED ORDER — FLUCONAZOLE 150 MG PO TABS: 150.0000 mg | ORAL_TABLET | Freq: Every day | ORAL | 0 refills | Status: DC

## 2020-10-13 NOTE — Telephone Encounter (Signed)
Patient's wife called. He needs a refill on Diflucan called in. Her call back number is [phone]

## 2020-11-04 ENCOUNTER — Ambulatory Visit (HOSPITAL_COMMUNITY): Admission: RE | Admit: 2020-11-04 | Payer: 59 | Source: Ambulatory Visit

## 2020-11-04 ENCOUNTER — Encounter (HOSPITAL_COMMUNITY): Payer: Self-pay

## 2020-11-09 ENCOUNTER — Other Ambulatory Visit: Payer: Self-pay | Admitting: Specialist

## 2020-11-09 ENCOUNTER — Other Ambulatory Visit: Payer: Self-pay | Admitting: Orthopaedic Surgery

## 2020-12-01 ENCOUNTER — Ambulatory Visit (HOSPITAL_COMMUNITY)
Admission: RE | Admit: 2020-12-01 | Discharge: 2020-12-01 | Disposition: A | Discharge status: Home / Self Care | Payer: 59 | Source: Ambulatory Visit | Attending: Urology | Admitting: Urology

## 2020-12-01 ENCOUNTER — Other Ambulatory Visit: Payer: Self-pay

## 2020-12-01 DIAGNOSIS — C61 Malignant neoplasm of prostate: Secondary | ICD-10-CM | POA: Insufficient documentation

## 2020-12-01 MED ORDER — PIFLIFOLASTAT F 18 (PYLARIFY) INJECTION
9.0000 | Freq: Once | INTRAVENOUS | Status: AC
  Administered 2020-12-01: 9.95 via INTRAVENOUS

## 2020-12-31 ENCOUNTER — Encounter: Payer: Self-pay | Admitting: Orthopaedic Surgery

## 2020-12-31 ENCOUNTER — Ambulatory Visit: Payer: Self-pay

## 2020-12-31 ENCOUNTER — Ambulatory Visit (INDEPENDENT_AMBULATORY_CARE_PROVIDER_SITE_OTHER): Payer: 59 | Admitting: Orthopaedic Surgery

## 2020-12-31 DIAGNOSIS — T8454XD Infection and inflammatory reaction due to internal left knee prosthesis, subsequent encounter: Secondary | ICD-10-CM

## 2020-12-31 DIAGNOSIS — Z96652 Presence of left artificial knee joint: Secondary | ICD-10-CM

## 2020-12-31 NOTE — Progress Notes (Signed)
HPI: [name] returns now 4 months status post open irrigation debridement with synovectomy and polyethylene exchange of an infected left total knee arthroplasty.  He is overall doing well.  He is due to see infectious disease tomorrow.  Only concern he has is there is an area lateral aspect of the knee that he has a scab over that just showed with no known injury.  He states this is similar to the scab that showed up prior to him developing staph infection of the left knee.  Denies any fevers or chills.  Review of systems: See HPI  Physical exam: General well-developed well-nourished male ambulates without any assistive device. Left knee full extension flexion to approximately 110 degrees.  No gross instability valgus varus stressing.  Surgical incisions well-healed.  There is a ring like scab of wound the anterior lateral aspect of the knee approximately the size of a nickel with central clearing.  Otherwise no signs of infection.  He has some lower leg edema calf supple nontender.  Radiographs left knee: Left knee well located.  Status post left total knee arthroplasty.  Arthroplasty components well-seated.  No bony abnormalities.  No acute fractures.  Impression: Status post left total knee polyexchange and I&D 08/14/2020 Left total knee arthroplasty 03/21/2019  Plan: He will keep his appointment with infectious disease tomorrow for continued oral antibiotics to treat the MSSA prosthetic knee infection.  Recommend he seek out dermatology for the anterior lateral knee lesion.  Dr. Ninfa Linden did suggest using hydrocortisone on this area to see if this helps clear this.  He will follow-up with [place] in 6 months for repeat AP and lateral views of the left knee.  Sooner if there is any questions concerns.

## 2021-01-01 ENCOUNTER — Ambulatory Visit (INDEPENDENT_AMBULATORY_CARE_PROVIDER_SITE_OTHER): Payer: 59 | Admitting: Infectious Disease

## 2021-01-01 ENCOUNTER — Encounter: Payer: Self-pay | Admitting: Infectious Disease

## 2021-01-01 ENCOUNTER — Other Ambulatory Visit: Payer: Self-pay

## 2021-01-01 VITALS — Ht 69.0 in | Wt 193.0 lb

## 2021-01-01 DIAGNOSIS — Z7185 Encounter for immunization safety counseling: Secondary | ICD-10-CM

## 2021-01-01 DIAGNOSIS — Z794 Long term (current) use of insulin: Secondary | ICD-10-CM

## 2021-01-01 DIAGNOSIS — L989 Disorder of the skin and subcutaneous tissue, unspecified: Secondary | ICD-10-CM

## 2021-01-01 DIAGNOSIS — T8454XD Infection and inflammatory reaction due to internal left knee prosthesis, subsequent encounter: Secondary | ICD-10-CM

## 2021-01-01 DIAGNOSIS — A4901 Methicillin susceptible Staphylococcus aureus infection, unspecified site: Secondary | ICD-10-CM

## 2021-01-01 DIAGNOSIS — I10 Essential (primary) hypertension: Secondary | ICD-10-CM | POA: Diagnosis not present

## 2021-01-01 DIAGNOSIS — E1169 Type 2 diabetes mellitus with other specified complication: Secondary | ICD-10-CM

## 2021-01-01 HISTORY — DX: Disorder of the skin and subcutaneous tissue, unspecified: L98.9

## 2021-01-01 HISTORY — DX: Type 2 diabetes mellitus with other specified complication: E11.69

## 2021-01-01 MED ORDER — CEFADROXIL 500 MG PO CAPS: 1000.0000 mg | ORAL_CAPSULE | Freq: Two times a day (BID) | ORAL | 6 refills | Status: DC

## 2021-01-01 NOTE — Progress Notes (Signed)
Subjective:      Patient ID: [name], male    DOB: 1955-11-30, 65 y.o.   MRN: [id number]  HPI  65 y.o. male history of multiple medical problems including diabetes mellitus osteoarthritis who had a right knee replacement performed by Dr. Ninfa Beard in December 2020.  He has had multiple surgeries from urology including radical prostatectomy and bilateral lymph node excision, with the latter surgery leaving him with chronic lymphedema changes in his left leg.     He then had fairly abrupt acute onset of pain in his knee along with erythema that spread proximally and distally.  He had noticed an area that he described as looking like "ringworm on the top of his knee prior to this developing.   The knee became so excruciatingly painful that he was not able to move anymore.   He was seen by Dr. Ninfa Beard who aspirated amount of thick dark fluid from the knee.   Cultures subsequently grew Staph aureus which turned out to be MSSA.   Patient has had an open arthrotomy with irrigation and debridement of the left knee with synovectomy and exchange of polyliner.  He was narrowed to cefazolin  3 times a day along with oral rifampin twice daily  Is having severe nausea and vomiting while on rifampin and saw my partner Dr. Megan Beard virtually.  Rifampin was stopped with improvement in his nausea.  Nausea did persist a little bit though while he was still on IV cefazolin but then went away he tells me once he had stopped IV cefazolin and switched over to cefadroxil twice daily.  It sounds like he is not taking the correct dose of cefadroxil as he is taking 2 tablets twice a day rather than 1 g twice a day which is how the prescription reads.  Knee pain dramatically improved.  [name] has seen Dr. Ninfa Beard again and he continues to do well.  He hardly has any pain in the knee more so when he bears weight and walks.  He does have an area that concerned him that was erythematous in the  left knee that he said similar to a lesion that occurred prior to the onset of his prosthetic knee infection.  This area has been examined by Dr. Ninfa Beard does not feel that there is any evidence of infection.  In fact the patient's lesion has responded to over-the-counter hydrocortisone.  He has tell me that when he goes on Medicare will not cover the cefadroxil though I wonder if this is the cefadroxil at the specific 1000 mg or 1 g dose.      Past Medical History:  Diagnosis Date   Arthritis    Depression    Diabetes mellitus without complication ([place]) 3235   Diabetes Type II   GERD (gastroesophageal reflux disease)    diet controlled   HTN (hypertension) 10/27/2018   Hyperlipidemia    Internal hemorrhoids 2014   noted on colonoscopy   MSSA (methicillin susceptible Staphylococcus aureus) infection 09/03/2020   Osteoarthritis of both knees    Prostate cancer [facility])    prostate   Sigmoid diverticulosis 2014   Mild, noted on colonoscopy    Past Surgical History:  Procedure Laterality Date   CARPAL TUNNEL RELEASE Right 02/22/2019   Procedure: CARPAL TUNNEL RELEASE;  Surgeon: Mark Civil, MD;  Location: [facility];  Service: Orthopedics;  Laterality: Right;   COLONOSCOPY N/A 09/13/2012   Procedure: COLONOSCOPY;  Surgeon: Mark Binder, MD;  [facility];  Service: Endoscopy;  Laterality: N/A;  9:30 AM   CYSTOSCOPY WITH URETHRAL DILATATION N/A 06/02/2020   Procedure: CYSTOSCOPY WITH URETHRAL DILATATION;  Surgeon: Mark Bring, MD;  [facility];  Service: Urology;  Laterality: N/A;  ONLY NEEDS 30 MIN   HERNIA REPAIR     20 years ago   I & D KNEE WITH POLY EXCHANGE Left 08/14/2020   Procedure: IRRIGATION AND DEBRIDEMENT LEFT KNEE with synovectomy WITH POLY EXCHANGE;  Surgeon: Mark Rossetti, MD;  Location: [place];  Service: Orthopedics;  Laterality: Left;   KNEE ARTHROSCOPY Left    15 years ago   LYMPHADENECTOMY Bilateral 05/30/2017   Procedure:  LYMPHADENECTOMY, PELVIC EXTENDED;  Surgeon: Mark Bring, MD;  [facility];  Service: Urology;  Laterality: Bilateral;   ROBOT ASSISTED LAPAROSCOPIC RADICAL PROSTATECTOMY N/A 05/30/2017   Procedure: XI ROBOTIC ASSISTED LAPAROSCOPIC RADICAL PROSTATECTOMY LEVEL 3;  Surgeon: Mark Bring, MD;  [facility];  Service: Urology;  Laterality: N/A;   SHOULDER ARTHROSCOPY Right    15 years ago   TOTAL KNEE ARTHROPLASTY Left 03/21/2019   Procedure: LEFT TOTAL KNEE ARTHROPLASTY;  Surgeon: Mark Rossetti, MD;  [facility];  Service: Orthopedics;  Laterality: Left;    Family History  Problem Relation Age of Onset   Cancer Mother        small cell lung      Social History   Socioeconomic History   Marital status: Married    Spouse name: Not on file   Number of children: 1   Years of education: Not on file   Highest education level: Not on file  Occupational History   Occupation: pastor  Tobacco Use   Smoking status: Never   Smokeless tobacco: Never  Vaping Use   Vaping Use: Never used  Substance and Sexual Activity   Alcohol use: No   Drug use: No   Sexual activity: Yes  Other Topics Concern   Not on file  Social History Narrative   Doristine Beard   Married to [name]   Daughter, Mark Gigi Gin, RN III, Resides in [place]   Social Determinants of Health   Financial Resource Strain: Low Risk    Difficulty of Paying Living Expenses: Not hard at all  Food Insecurity: No Food Insecurity   Worried About Charity fundraiser in the Last Year: Never true   Arboriculturist in the Last Year: Never true  Transportation Needs: No Transportation Needs   Lack of Transportation (Medical): No   Lack of Transportation (Non-Medical): No  Physical Activity: Not on file  Stress: No Stress Concern Present   Feeling of Stress : Not at all  Social Connections: Not on file    No Known Allergies   Current Outpatient Medications:    aspirin 325 MG EC tablet, TAKE 1  TABLET BY MOUTH EVERY DAY, Disp: 30 tablet, Rfl: 3   cefadroxil (DURICEF) 1 g tablet, Take 1 tablet (1 g total) by mouth 2 (two) times daily., Disp: 60 tablet, Rfl: 10   Continuous Blood Gluc Sensor (FREESTYLE LIBRE 14 DAY SENSOR) MISC, SMARTSIG:Topical Every 10 Days, Disp: , Rfl:    diclofenac (VOLTAREN) 75 MG EC tablet, TAKE 1 TABLET BY MOUTH 2 TIMES DAILY BETWEEN MEALS AS NEEDED., Disp: 60 tablet, Rfl: 3   docusate sodium (COLACE) 100 MG capsule, TAKE 1 CAPSULE BY MOUTH TWICE A DAY, Disp: 10 capsule, Rfl: 0   escitalopram (LEXAPRO) 10 MG tablet, Take  10 mg by mouth daily., Disp: , Rfl:    fluconazole (DIFLUCAN) 150 MG tablet, Take 1 tablet (150 mg total) by mouth daily., Disp: 5 tablet, Rfl: 0   insulin aspart (NOVOLOG) 100 UNIT/ML injection, Inject 8 Units into the skin 3 (three) times daily with meals., Disp: , Rfl:    losartan-hydrochlorothiazide (HYZAAR) 50-12.5 MG tablet, TAKE 1 TABLET BY MOUTH EVERY DAY, Disp: 90 tablet, Rfl: 1   methocarbamol (ROBAXIN) 500 MG tablet, Take 1 tablet (500 mg total) by mouth every 6 (six) hours as needed for muscle spasms., Disp: 30 tablet, Rfl: 1   naproxen sodium (ALEVE) 220 MG tablet, Take 220-440 mg by mouth daily as needed (pain)., Disp: , Rfl:    Omega-3 Fatty Acids (FISH OIL MAXIMUM STRENGTH) 1200 MG CAPS, Take 1 capsule by mouth daily., Disp: , Rfl:    ondansetron (ZOFRAN) 4 MG tablet, Take 1 tablet (4 mg total) by mouth every 8 (eight) hours as needed for nausea or vomiting., Disp: 90 tablet, Rfl: 2   oxyCODONE (OXY IR/ROXICODONE) 5 MG immediate release tablet, Take 1 tablet (5 mg total) by mouth every 6 (six) hours as needed for moderate pain (pain score 4-6)., Disp: 40 tablet, Rfl: 0   rosuvastatin (CRESTOR) 20 MG tablet, TAKE 1 TABLET BY MOUTH EVERY DAY (Patient taking differently: Take 20 mg by mouth daily.), Disp: 90 tablet, Rfl: 1   TRESIBA FLEXTOUCH 100 UNIT/ML SOPN FlexTouch Pen, Inject 64 Units into the skin daily., Disp: , Rfl:    ceFAZolin  (ANCEF) 2-4 GM/100ML-% IVPB, Inject 100 mLs (2 g total) into the vein every 8 (eight) hours. (Patient not taking: Reported on 01/01/2021), Disp: 40 each, Rfl: 1   rifampin (RIFADIN) 300 MG capsule, TAKE 1 CAPSULE BY MOUTH TWICE A DAY (Patient not taking: Reported on 01/01/2021), Disp: 78 capsule, Rfl: 1   Review of Systems  Constitutional:  Negative for activity change, appetite change, chills, diaphoresis, fatigue, fever and unexpected weight change.  HENT:  Negative for congestion, rhinorrhea, sinus pressure, sneezing, sore throat and trouble swallowing.   Eyes:  Negative for photophobia and visual disturbance.  Respiratory:  Negative for cough, chest tightness, shortness of breath, wheezing and stridor.   Cardiovascular:  Negative for chest pain, palpitations and leg swelling.  Gastrointestinal:  Negative for abdominal distention, abdominal pain, anal bleeding, blood in stool, constipation, diarrhea, nausea and vomiting.  Genitourinary:  Negative for difficulty urinating, dysuria, flank pain and hematuria.  Musculoskeletal:  Negative for arthralgias, back pain, gait problem, joint swelling and myalgias.  Skin:  Positive for rash. Negative for color change, pallor and wound.  Neurological:  Negative for dizziness, tremors, weakness and light-headedness.  Hematological:  Negative for adenopathy. Does not bruise/bleed easily.  Psychiatric/Behavioral:  Negative for agitation, behavioral problems, confusion, decreased concentration, dysphoric mood and sleep disturbance.       Knee 10/03/2020:    Knee January 01, 2021: 1 can see the area where he is apply the corticosteroids that is a bit lateral        Objective:   Physical Exam Constitutional:      Appearance: He is well-developed.  HENT:     Head: Normocephalic and atraumatic.  Eyes:     Conjunctiva/sclera: Conjunctivae normal.  Cardiovascular:     Rate and Rhythm: Normal rate and regular rhythm.  Pulmonary:     Effort:  Pulmonary effort is normal. No respiratory distress.     Breath sounds: No wheezing.  Abdominal:     General: There  is no distension.     Palpations: Abdomen is soft.  Musculoskeletal:        General: No tenderness. Normal range of motion.     Cervical back: Normal range of motion and neck supple.  Skin:    General: Skin is warm and dry.     Coloration: Skin is not pale.     Findings: No erythema or rash.  Neurological:     General: No focal deficit present.     Mental Status: He is alert and oriented to person, place, and time.  Psychiatric:        Mood and Affect: Mood normal.        Behavior: Behavior normal.        Thought Content: Thought content normal.        Judgment: Judgment normal.          Assessment & Plan:   Left prosthetic knee infection with MSSA:  I am changing his prescription to TWO 500 mg cefadroxil tablets to be taken twice daily hopes that this will not be an issue with his Medicare coverage.  If it is an issue would consider going to cephalexin or doxycycline.  We will check a sed rate CRP CMP and CBC with differential  Skin rash: Can continue to try topical steroids and also be helpful to see dermatology   Hypertension: On losartan hydrochlorothiazide  Insulin requiring diabetes mellitus followed by Dr. Dagmar Hait   Vaccine counseling counseled him that it be helpful if he would get vaccine against COVID-19 against influenza but he was against doing either of these 2.

## 2021-01-02 LAB — CBC WITH DIFFERENTIAL/PLATELET
Absolute Monocytes: 383 cells/uL (ref 200–950)
Basophils Absolute: 31 cells/uL (ref 0–200)
Basophils Relative: 0.6 %
Eosinophils Absolute: 332 cells/uL (ref 15–500)
Eosinophils Relative: 6.5 %
HCT: 44.5 % (ref 38.5–50.0)
Hemoglobin: 15.2 g/dL (ref 13.2–17.1)
Lymphs Abs: 1107 cells/uL (ref 850–3900)
MCH: 29.9 pg (ref 27.0–33.0)
MCHC: 34.2 g/dL (ref 32.0–36.0)
MCV: 87.6 fL (ref 80.0–100.0)
MPV: 9.6 fL (ref 7.5–12.5)
Monocytes Relative: 7.5 %
Neutro Abs: 3249 cells/uL (ref 1500–7800)
Neutrophils Relative %: 63.7 %
Platelets: 168 10*3/uL (ref 140–400)
RBC: 5.08 10*6/uL (ref 4.20–5.80)
RDW: 13.5 % (ref 11.0–15.0)
Total Lymphocyte: 21.7 %
WBC: 5.1 10*3/uL (ref 3.8–10.8)

## 2021-01-02 LAB — BASIC METABOLIC PANEL WITH GFR
BUN: 21 mg/dL (ref 7–25)
CO2: 29 mmol/L (ref 20–32)
Calcium: 9.2 mg/dL (ref 8.6–10.3)
Chloride: 105 mmol/L (ref 98–110)
Creat: 1.03 mg/dL (ref 0.70–1.35)
Glucose, Bld: 183 mg/dL — ABNORMAL HIGH (ref 65–99)
Potassium: 4.5 mmol/L (ref 3.5–5.3)
Sodium: 139 mmol/L (ref 135–146)
eGFR: 81 mL/min/{1.73_m2} (ref >=60)

## 2021-01-02 LAB — C-REACTIVE PROTEIN: CRP: 7 mg/L (ref <8.0)

## 2021-01-02 LAB — SEDIMENTATION RATE: Sed Rate: 6 mm/h (ref 0–20)

## 2021-01-05 ENCOUNTER — Ambulatory Visit: Payer: 59 | Admitting: Infectious Disease

## 2021-02-22 ENCOUNTER — Other Ambulatory Visit: Payer: Self-pay | Admitting: Cardiology

## 2021-02-22 DIAGNOSIS — I10 Essential (primary) hypertension: Secondary | ICD-10-CM

## 2021-02-23 ENCOUNTER — Other Ambulatory Visit: Payer: Self-pay | Admitting: Orthopaedic Surgery

## 2021-03-03 ENCOUNTER — Telehealth: Payer: Self-pay

## 2021-03-03 ENCOUNTER — Other Ambulatory Visit: Payer: Self-pay | Admitting: Orthopaedic Surgery

## 2021-03-03 NOTE — Telephone Encounter (Signed)
Patient called requesting refill of fluconazole for genital yeast infection from the antibiotics. Will route to provider.   Beryle Flock, RN

## 2021-03-04 ENCOUNTER — Other Ambulatory Visit: Payer: Self-pay | Admitting: Infectious Disease

## 2021-03-04 MED ORDER — FLUCONAZOLE 100 MG PO TABS: 100.0000 mg | ORAL_TABLET | Freq: Every day | ORAL | 0 refills | Status: DC

## 2021-03-04 NOTE — Telephone Encounter (Signed)
Notified patient that prescription was sent.   Beryle Flock, RN

## 2021-04-13 ENCOUNTER — Telehealth: Payer: Self-pay | Admitting: Orthopaedic Surgery

## 2021-04-13 NOTE — Telephone Encounter (Signed)
Called. Scheduled for them to come in the morning for check

## 2021-04-13 NOTE — Telephone Encounter (Signed)
Pt's wife Diane called a call back. Pt's wife states husband leg is swollen and can be a possible infection. Please call her at [phone].

## 2021-04-14 ENCOUNTER — Other Ambulatory Visit: Payer: Self-pay

## 2021-04-14 ENCOUNTER — Encounter: Payer: Self-pay | Admitting: Orthopaedic Surgery

## 2021-04-14 ENCOUNTER — Ambulatory Visit (INDEPENDENT_AMBULATORY_CARE_PROVIDER_SITE_OTHER): Payer: Self-pay | Admitting: Orthopaedic Surgery

## 2021-04-14 DIAGNOSIS — M25561 Pain in right knee: Secondary | ICD-10-CM

## 2021-04-14 DIAGNOSIS — T8454XD Infection and inflammatory reaction due to internal left knee prosthesis, subsequent encounter: Secondary | ICD-10-CM

## 2021-04-14 DIAGNOSIS — Z96652 Presence of left artificial knee joint: Secondary | ICD-10-CM

## 2021-04-14 MED ORDER — OXYCODONE HCL 5 MG PO TABS: 5.0000 mg | ORAL_TABLET | Freq: Four times a day (QID) | ORAL | 0 refills | Status: DC | PRN

## 2021-04-14 MED ORDER — LIDOCAINE HCL 1 % IJ SOLN
3.0000 mL | INTRAMUSCULAR | Status: AC | PRN
  Administered 2021-04-14: 09:00:00 3 mL

## 2021-04-14 NOTE — Progress Notes (Signed)
Office Visit Note   Patient: [name]           Date of Birth: 17-May-1955           MRN: [id number] Visit Date: 04/14/2021              Requested by: [name], MD [address] [place],  [place] [address] PCP: [name], MD   Assessment & Plan: Visit Diagnoses:  1. History of left knee replacement   2. Infection of total left knee replacement, subsequent encounter     Plan:  We will send aspirate for cell count gram stain culture and sensitivity draw CBC sed rate and CRP on today.  Having back in 1 week to go over the results and discuss further treatment.  He did refill his oxycodone.  He used very sparingly last prescription was in June of last year.  Questions were encouraged and answered by Dr.Blackman and myself.   Follow-Up Instructions: Return in about 1 week (around 04/21/2021).   Orders:  No orders of the defined types were placed in this encounter.  Meds ordered this encounter  Medications   oxyCODONE (OXY IR/ROXICODONE) 5 MG immediate release tablet    Sig: Take 1 tablet (5 mg total) by mouth every 6 (six) hours as needed for moderate pain (pain score 4-6).    Dispense:  40 tablet    Refill:  0      Procedures: Large Joint Inj: L knee on 04/14/2021 8:42 AM Indications: pain Details: 22 G 1.5 in needle, superolateral approach  Arthrogram: No  Medications: 3 mL lidocaine 1 % Aspirate: 44 mL yellow and cloudy Outcome: tolerated well, no immediate complications Procedure, treatment alternatives, risks and benefits explained, specific risks discussed. Consent was given by the patient. Immediately prior to procedure a time out was called to verify the correct patient, procedure, equipment, support staff and site/side marked as required. Patient was prepped and draped in the usual sterile fashion.      Clinical Data: No additional findings.   Subjective: Chief Complaint  Patient presents with   Left Knee - Follow-up, Wound Check     HPI Patient with history of infected left total knee arthroplasty.  He is on Duricef chronically for Staph aureus ,MSSA.  Denies any fevers chills.  They ache about the knee and some swelling.  No new injury.  Notes that the rash lateral aspect of the knee has improved with the use of hydrogen cortisone did see dermatology and they were unsure of what was causing the rash.  No new injury to the knee.  Review of Systems Negative for fevers chills  Objective: Vital Signs: There were no vitals taken for this visit.  Physical Exam Constitutional:      Appearance: He is not ill-appearing or diaphoretic.  Pulmonary:     Effort: Pulmonary effort is normal.  Neurological:     Mental Status: He is alert and oriented to person, place, and time.  Psychiatric:        Mood and Affect: Mood normal.    Ortho Exam Left knee slight effusion.  No abnormal warmth or erythema.  Surgical incisions well-healed.  Rash over the lateral anterior aspect of the knee is resolving.  He has slightly limited flexion of the knee prior to aspiration but after aspiration he has full range of motion of the knee without pain. Specialty Comments:  No specialty comments available.  Imaging: No results found.   PMFS History: Patient Active  Problem List   Diagnosis Date Noted   Skin lesion 01/01/2021   Type 2 diabetes mellitus with other specified complication ([place]) 93/90/3009   Nausea and vomiting 09/11/2020   MSSA (methicillin susceptible Staphylococcus aureus) infection 09/03/2020   Effusion, left knee 08/14/2020   Infection of total left knee replacement ([place]) 08/14/2020   Status post total left knee replacement 03/21/2019   S/P carpal tunnel release right 02/22/19 03/08/2019   Carpal tunnel syndrome of right wrist    HTN (hypertension) 10/27/2018   Prostate cancer ([place]) 05/30/2017   Malignant neoplasm of prostate ([place]) 04/28/2017   Chronic pain of both knees 02/21/2017   Unilateral primary  osteoarthritis, left knee 02/21/2017   Unilateral primary osteoarthritis, right knee 02/21/2017   Past Medical History:  Diagnosis Date   Arthritis    Depression    Diabetes mellitus without complication ([place]) 2330   Diabetes Type II   GERD (gastroesophageal reflux disease)    diet controlled   HTN (hypertension) 10/27/2018   Hyperlipidemia    Internal hemorrhoids 2014   noted on colonoscopy   MSSA (methicillin susceptible Staphylococcus aureus) infection 09/03/2020   Osteoarthritis of both knees    Prostate cancer ([place])    prostate   Sigmoid diverticulosis 2014   Mild, noted on colonoscopy   Skin lesion 01/01/2021   Type 2 diabetes mellitus with other specified complication ([place]) 07/62/2633    Family History  Problem Relation Age of Onset   Cancer Mother        small cell lung    Past Surgical History:  Procedure Laterality Date   CARPAL TUNNEL RELEASE Right 02/22/2019   Procedure: CARPAL TUNNEL RELEASE;  Surgeon: Carole Civil, MD;  [facility];  Service: Orthopedics;  Laterality: Right;   COLONOSCOPY N/A 09/13/2012   Procedure: COLONOSCOPY;  Surgeon: Danie Binder, MD;  [facility];  Service: Endoscopy;  Laterality: N/A;  9:30 AM   CYSTOSCOPY WITH URETHRAL DILATATION N/A 06/02/2020   Procedure: CYSTOSCOPY WITH URETHRAL DILATATION;  Surgeon: [name], MD;  [facility];  Service: Urology;  Laterality: N/A;  ONLY NEEDS 30 MIN   HERNIA REPAIR     20 years ago   I & D KNEE WITH POLY EXCHANGE Left 08/14/2020   Procedure: IRRIGATION AND DEBRIDEMENT LEFT KNEE with synovectomy WITH POLY EXCHANGE;  Surgeon: Mcarthur Rossetti, MD;  Location: [place];  Service: Orthopedics;  Laterality: Left;   KNEE ARTHROSCOPY Left    15 years ago   LYMPHADENECTOMY Bilateral 05/30/2017   Procedure: LYMPHADENECTOMY, PELVIC EXTENDED;  Surgeon: [name], MD;  [facility];  Service: Urology;  Laterality: Bilateral;   ROBOT ASSISTED LAPAROSCOPIC RADICAL  PROSTATECTOMY N/A 05/30/2017   Procedure: XI ROBOTIC ASSISTED LAPAROSCOPIC RADICAL PROSTATECTOMY LEVEL 3;  Surgeon: [name], MD;  [facility];  Service: Urology;  Laterality: N/A;   SHOULDER ARTHROSCOPY Right    15 years ago   TOTAL KNEE ARTHROPLASTY Left 03/21/2019   Procedure: LEFT TOTAL KNEE ARTHROPLASTY;  Surgeon: Mcarthur Rossetti, MD;  [facility];  Service: Orthopedics;  Laterality: Left;   Social History   Occupational History   Occupation: pastor  Tobacco Use   Smoking status: Never   Smokeless tobacco: Never  Vaping Use   Vaping Use: Never used  Substance and Sexual Activity   Alcohol use: No   Drug use: No   Sexual activity: Yes

## 2021-04-14 NOTE — Addendum Note (Signed)
Addended by: Jacklyn Shell on: 04/14/2021 08:54 AM   Modules accepted: Orders

## 2021-04-15 ENCOUNTER — Telehealth: Payer: Self-pay | Admitting: Orthopaedic Surgery

## 2021-04-15 LAB — C-REACTIVE PROTEIN: CRP: 54.5 mg/L — ABNORMAL HIGH (ref <8.0)

## 2021-04-15 LAB — CBC WITH DIFFERENTIAL/PLATELET
Absolute Monocytes: 453 cells/uL (ref 200–950)
Basophils Absolute: 19 cells/uL (ref 0–200)
Basophils Relative: 0.3 %
Eosinophils Absolute: 242 cells/uL (ref 15–500)
Eosinophils Relative: 3.9 %
HCT: 39.2 % (ref 38.5–50.0)
Hemoglobin: 13.1 g/dL — ABNORMAL LOW (ref 13.2–17.1)
Lymphs Abs: 1302 cells/uL (ref 850–3900)
MCH: 30.1 pg (ref 27.0–33.0)
MCHC: 33.4 g/dL (ref 32.0–36.0)
MCV: 90.1 fL (ref 80.0–100.0)
MPV: 9.7 fL (ref 7.5–12.5)
Monocytes Relative: 7.3 %
Neutro Abs: 4185 cells/uL (ref 1500–7800)
Neutrophils Relative %: 67.5 %
Platelets: 221 10*3/uL (ref 140–400)
RBC: 4.35 10*6/uL (ref 4.20–5.80)
RDW: 12.7 % (ref 11.0–15.0)
Total Lymphocyte: 21 %
WBC: 6.2 10*3/uL (ref 3.8–10.8)

## 2021-04-15 LAB — GRAM STAIN
MICRO NUMBER:: 12911878
SPECIMEN QUALITY:: ADEQUATE

## 2021-04-15 LAB — SEDIMENTATION RATE: Sed Rate: 33 mm/h — ABNORMAL HIGH (ref 0–20)

## 2021-04-15 NOTE — Telephone Encounter (Signed)
Pt and wife calling about labs that were taken at previous appt. Concerned because the mychart stated the results were in but could not access the results and wanted to make sure everything was okay and they did not need to do anything before pts next appt this coming week. The best call back number is [phone]

## 2021-04-20 ENCOUNTER — Ambulatory Visit: Payer: HMO | Admitting: Physician Assistant

## 2021-04-20 ENCOUNTER — Ambulatory Visit: Payer: Self-pay | Admitting: Orthopaedic Surgery

## 2021-04-20 ENCOUNTER — Encounter: Payer: Self-pay | Admitting: Physician Assistant

## 2021-04-20 DIAGNOSIS — T8454XD Infection and inflammatory reaction due to internal left knee prosthesis, subsequent encounter: Secondary | ICD-10-CM | POA: Diagnosis not present

## 2021-04-20 LAB — BODY FLUID CULTURE

## 2021-04-20 NOTE — Progress Notes (Signed)
Office Visit Note   Patient: [name]           Date of Birth: 1955-11-20           MRN: [id number] Visit Date: 04/20/2021              Requested by: [name], MD [address] [place],  [place] [address] PCP: [name], MD   Assessment & Plan: Visit Diagnoses:  1. Infection of total left knee replacement, subsequent encounter     Plan:  Reviewed patient's lab findings with him and explained to him that even the the aspirate from the knee showed no organisms this most likely is due to the fact that he is on long-term antibiotic suppression.  Discussed with him that his sed rate and CRP were both elevated.  Given his recurrent effusion and continued pain recommend excision of the left total knee arthroplasty components with antibiotic spacer.  Followed by 6 weeks of IV antibiotics.  We will go ahead and work on scheduling him for surgery however did discuss with him that he needs to see his primary care doctor make sure that his hemoglobin A1c is within acceptable limits.  Follow-up with Dr. Ninfa Linden in 2 weeks.  Questions were encouraged and answered  Follow-Up Instructions: Return in about 2 weeks (around 05/04/2021).   Orders:  No orders of the defined types were placed in this encounter.  No orders of the defined types were placed in this encounter.     Procedures: No procedures performed   Clinical Data: No additional findings.   Subjective: Chief Complaint  Patient presents with   Left Knee - Follow-up    HPI [name] returns today for follow-up of his left knee status post aspiration.  He still having swelling and pain in the knee.  He reports that his glucose levels were up to 300 last night but are back down to 90 today.  He has had no fevers or chills.  He is on long-term antibiotic suppression due to the left knee infection with MSSA.  Labs from last visit left knee aspirate no growth 56 to 72 hours.  Sed rate is 33 and CRP was 54.5.   CBC showed white count to be 6200 hemoglobin 13.1  Review of Systems See HPI.  Objective: Vital Signs: There were no vitals taken for this visit.  Physical Exam General: Well-developed well-nourished male no acute distress ambulates without any assistive device. Ortho Exam Left knee no abnormal warmth erythema.  Positive recurrent effusion.  No drainage no gross signs of infection.  No instability valgus varus stressing.  Full extension and flexion beyond 90 degrees. Specialty Comments:  No specialty comments available.  Imaging: No results found.   PMFS History: Patient Active Problem List   Diagnosis Date Noted   Skin lesion 01/01/2021   Type 2 diabetes mellitus with other specified complication ([facility]) 54/65/6812   Nausea and vomiting 09/11/2020   MSSA (methicillin susceptible Staphylococcus aureus) infection 09/03/2020   Effusion, left knee 08/14/2020   Infection of total left knee replacement ([facility]) 08/14/2020   Status post total left knee replacement 03/21/2019   S/P carpal tunnel release right 02/22/19 03/08/2019   Carpal tunnel syndrome of right wrist    HTN (hypertension) 10/27/2018   Prostate cancer ([facility]) 05/30/2017   Malignant neoplasm of prostate ([facility]) 04/28/2017   Chronic pain of both knees 02/21/2017   Unilateral primary osteoarthritis, left knee 02/21/2017   Unilateral primary osteoarthritis, right knee 02/21/2017  Past Medical History:  Diagnosis Date   Arthritis    Depression    Diabetes mellitus without complication ([facility]) 1610   Diabetes Type II   GERD (gastroesophageal reflux disease)    diet controlled   HTN (hypertension) 10/27/2018   Hyperlipidemia    Internal hemorrhoids 2014   noted on colonoscopy   MSSA (methicillin susceptible Staphylococcus aureus) infection 09/03/2020   Osteoarthritis of both knees    Prostate cancer ([facility])    prostate   Sigmoid diverticulosis 2014   Mild, noted on colonoscopy   Skin lesion 01/01/2021   Type 2 diabetes  mellitus with other specified complication ([facility]) 96/06/5407    Family History  Problem Relation Age of Onset   Cancer Mother        small cell lung    Past Surgical History:  Procedure Laterality Date   CARPAL TUNNEL RELEASE Right 02/22/2019   Procedure: CARPAL TUNNEL RELEASE;  Surgeon: Carole Civil, MD;  [facility]: [facility];  Service: Orthopedics;  Laterality: Right;   COLONOSCOPY N/A 09/13/2012   Procedure: COLONOSCOPY;  Surgeon: Danie Binder, MD;  [facility];  Service: Endoscopy;  Laterality: N/A;  9:30 AM   CYSTOSCOPY WITH URETHRAL DILATATION N/A 06/02/2020   Procedure: CYSTOSCOPY WITH URETHRAL DILATATION;  Surgeon: [name], MD;  [facility];  Service: Urology;  Laterality: N/A;  ONLY NEEDS 30 MIN   HERNIA REPAIR     20 years ago   I & D KNEE WITH POLY EXCHANGE Left 08/14/2020   Procedure: IRRIGATION AND DEBRIDEMENT LEFT KNEE with synovectomy WITH POLY EXCHANGE;  Surgeon: Mcarthur Rossetti, MD;  Location: [facility];  Service: Orthopedics;  Laterality: Left;   KNEE ARTHROSCOPY Left    15 years ago   LYMPHADENECTOMY Bilateral 05/30/2017   Procedure: LYMPHADENECTOMY, PELVIC EXTENDED;  Surgeon: [name], MD;  [facility];  Service: Urology;  Laterality: Bilateral;   ROBOT ASSISTED LAPAROSCOPIC RADICAL PROSTATECTOMY N/A 05/30/2017   Procedure: XI ROBOTIC ASSISTED LAPAROSCOPIC RADICAL PROSTATECTOMY LEVEL 3;  Surgeon: [name], MD;  [facility];  Service: Urology;  Laterality: N/A;   SHOULDER ARTHROSCOPY Right    15 years ago   TOTAL KNEE ARTHROPLASTY Left 03/21/2019   Procedure: LEFT TOTAL KNEE ARTHROPLASTY;  Surgeon: Mcarthur Rossetti, MD;  [facility];  Service: Orthopedics;  Laterality: Left;   Social History   Occupational History   Occupation: pastor  Tobacco Use   Smoking status: Never   Smokeless tobacco: Never  Vaping Use   Vaping Use: Never used  Substance and Sexual Activity   Alcohol use: No   Drug use: No    Sexual activity: Yes

## 2021-05-04 ENCOUNTER — Other Ambulatory Visit: Payer: Self-pay

## 2021-05-04 ENCOUNTER — Telehealth: Payer: Self-pay | Admitting: Orthopedic Surgery

## 2021-05-04 ENCOUNTER — Encounter: Payer: Self-pay | Admitting: Infectious Disease

## 2021-05-04 ENCOUNTER — Ambulatory Visit (INDEPENDENT_AMBULATORY_CARE_PROVIDER_SITE_OTHER): Payer: HMO | Admitting: Physician Assistant

## 2021-05-04 ENCOUNTER — Encounter: Payer: Self-pay | Admitting: Physician Assistant

## 2021-05-04 DIAGNOSIS — T8454XD Infection and inflammatory reaction due to internal left knee prosthesis, subsequent encounter: Secondary | ICD-10-CM

## 2021-05-04 DIAGNOSIS — Z7185 Encounter for immunization safety counseling: Secondary | ICD-10-CM

## 2021-05-04 HISTORY — DX: Encounter for immunization safety counseling: Z71.85

## 2021-05-04 NOTE — Telephone Encounter (Signed)
Ms. Crooker called in to report Arnol's last A1-C which was 7.3 per Dr. Dagmar Hait.

## 2021-05-04 NOTE — Telephone Encounter (Signed)
FYI

## 2021-05-04 NOTE — Progress Notes (Signed)
Subjective:   Chief complaint worsening swelling in his knee   Patient ID: [name], male    DOB: 05/10/1955, 66 y.o.   MRN: [id number]  HPI  66 y.o. male history of multiple medical problems including diabetes mellitus osteoarthritis who had a right knee replacement performed by Dr. Ninfa Linden in December 2020.  He has had multiple surgeries from urology including radical prostatectomy and bilateral lymph node excision, with the latter surgery leaving him with chronic lymphedema changes in his left leg.     He then had fairly abrupt acute onset of pain in his knee along with erythema that spread proximally and distally.  He had noticed an area that he described as looking like "ringworm on the top of his knee prior to this developing.   The knee became so excruciatingly painful that he was not able to move anymore.   He was seen by Dr. Ninfa Linden who aspirated amount of thick dark fluid from the knee.   Cultures subsequently grew Staph aureus which turned out to be MSSA.   Patient has had an open arthrotomy with irrigation and debridement of the left knee with synovectomy and exchange of polyliner.  He was narrowed to cefazolin  3 times a day along with oral rifampin twice daily  Is having severe nausea and vomiting while on rifampin and saw my partner Dr. Megan Salon virtually.  Rifampin was stopped with improvement in his nausea.  Nausea did persist a little bit though while he was still on IV cefazolin but then went away he tells me once he had stopped IV cefazolin and switched over to cefadroxil twice daily.  It sounds like he is not taking the correct dose of cefadroxil as he is taking 2 tablets twice a day rather than 1 g twice a day which is how the prescription reads.  Knee pain dramatically improved.  [name] has seen Dr. Ninfa Linden again and he continued to do well.  He hardly had any pain in the knee   He does have an area that concerned him that was erythematous in  the left knee that he said similar to a lesion that occurred prior to the onset of his prosthetic knee infection.  This area has been examined by Dr. Ninfa Linden does not feel that there is any evidence of infection.  In fact the patient's lesion has responded to over-the-counter hydrocortisone.  He will continue on cefadroxil to 500 mg twice daily.  Unfortunately interim is developed worsening swelling of the knee and pain and his inflammatory markers have increased dramatically.  For the knee was performed with culture sent which did not yield an organism which not surprising since he has been on cefadroxil.    He is currently planned for resection arthroplasty removal of components next week.        Past Medical History:  Diagnosis Date   Arthritis    Depression    Diabetes mellitus without complication ([facility]) 6962   Diabetes Type II   GERD (gastroesophageal reflux disease)    diet controlled   HTN (hypertension) 10/27/2018   Hyperlipidemia    Internal hemorrhoids 2014   noted on colonoscopy   MSSA (methicillin susceptible Staphylococcus aureus) infection 09/03/2020   Osteoarthritis of both knees    Prostate cancer ([facility])    prostate   Sigmoid diverticulosis 2014   Mild, noted on colonoscopy   Skin lesion 01/01/2021   Type 2 diabetes mellitus with other specified complication ([facility]) 95/28/4132  Past Surgical History:  Procedure Laterality Date   CARPAL TUNNEL RELEASE Right 02/22/2019   Procedure: CARPAL TUNNEL RELEASE;  Surgeon: Carole Civil, MD;  [facility];  Service: Orthopedics;  Laterality: Right;   COLONOSCOPY N/A 09/13/2012   Procedure: COLONOSCOPY;  Surgeon: Danie Binder, MD;  [facility];  Service: Endoscopy;  Laterality: N/A;  9:30 AM   CYSTOSCOPY WITH URETHRAL DILATATION N/A 06/02/2020   Procedure: CYSTOSCOPY WITH URETHRAL DILATATION;  Surgeon: [name], MD;  [facility];  Service: Urology;  Laterality: N/A;  ONLY NEEDS 30 MIN    HERNIA REPAIR     20 years ago   I & D KNEE WITH POLY EXCHANGE Left 08/14/2020   Procedure: IRRIGATION AND DEBRIDEMENT LEFT KNEE with synovectomy WITH POLY EXCHANGE;  Surgeon: Mcarthur Rossetti, MD;  Location: [facility];  Service: Orthopedics;  Laterality: Left;   KNEE ARTHROSCOPY Left    15 years ago   LYMPHADENECTOMY Bilateral 05/30/2017   Procedure: LYMPHADENECTOMY, PELVIC EXTENDED;  Surgeon: [name], MD;  [facility];  Service: Urology;  Laterality: Bilateral;   ROBOT ASSISTED LAPAROSCOPIC RADICAL PROSTATECTOMY N/A 05/30/2017   Procedure: XI ROBOTIC ASSISTED LAPAROSCOPIC RADICAL PROSTATECTOMY LEVEL 3;  Surgeon: [name], MD;  [facility];  Service: Urology;  Laterality: N/A;   SHOULDER ARTHROSCOPY Right    15 years ago   TOTAL KNEE ARTHROPLASTY Left 03/21/2019   Procedure: LEFT TOTAL KNEE ARTHROPLASTY;  Surgeon: Mcarthur Rossetti, MD;  [facility];  Service: Orthopedics;  Laterality: Left;    Family History  Problem Relation Age of Onset   Cancer Mother        small cell lung      Social History   Socioeconomic History   Marital status: Married    Spouse name: Not on file   Number of children: 1   Years of education: Not on file   Highest education level: Not on file  Occupational History   Occupation: pastor  Tobacco Use   Smoking status: Never   Smokeless tobacco: Never  Vaping Use   Vaping Use: Never used  Substance and Sexual Activity   Alcohol use: No   Drug use: No   Sexual activity: Yes  Other Topics Concern   Not on file  Social History Narrative   Doristine Bosworth   Married to [employer]   Daughter, Abby Gigi Gin, RN III, Resides in [place]   Social Determinants of Health   Financial Resource Strain: Low Risk    Difficulty of Paying Living Expenses: Not hard at all  Food Insecurity: No Food Insecurity   Worried About Charity fundraiser in the Last Year: Never true   Arboriculturist in the Last Year: Never true   Transportation Needs: No Transportation Needs   Lack of Transportation (Medical): No   Lack of Transportation (Non-Medical): No  Physical Activity: Not on file  Stress: No Stress Concern Present   Feeling of Stress : Not at all  Social Connections: Not on file    No Known Allergies   Current Outpatient Medications:    aspirin 325 MG EC tablet, TAKE 1 TABLET BY MOUTH EVERY DAY, Disp: 90 tablet, Rfl: 1   cefadroxil (DURICEF) 500 MG capsule, Take 2 capsules (1,000 mg total) by mouth 2 (two) times daily., Disp: 120 capsule, Rfl: 6   Continuous Blood Gluc Sensor (FREESTYLE LIBRE 14 DAY SENSOR) MISC, SMARTSIG:Topical Every 10 Days, Disp: , Rfl:  diclofenac (VOLTAREN) 75 MG EC tablet, TAKE 1 TABLET BY MOUTH 2 TIMES DAILY BETWEEN MEALS AS NEEDED., Disp: 60 tablet, Rfl: 3   docusate sodium (COLACE) 100 MG capsule, TAKE 1 CAPSULE BY MOUTH TWICE A DAY, Disp: 10 capsule, Rfl: 0   escitalopram (LEXAPRO) 10 MG tablet, Take 10 mg by mouth daily., Disp: , Rfl:    fluconazole (DIFLUCAN) 100 MG tablet, Take 1 tablet (100 mg total) by mouth daily., Disp: 10 tablet, Rfl: 0   insulin aspart (NOVOLOG) 100 UNIT/ML injection, Inject 8 Units into the skin 3 (three) times daily with meals., Disp: , Rfl:    losartan-hydrochlorothiazide (HYZAAR) 50-12.5 MG tablet, TAKE 1 TABLET BY MOUTH EVERY DAY, Disp: 90 tablet, Rfl: 1   methocarbamol (ROBAXIN) 500 MG tablet, Take 1 tablet (500 mg total) by mouth every 6 (six) hours as needed for muscle spasms., Disp: 30 tablet, Rfl: 1   naproxen sodium (ALEVE) 220 MG tablet, Take 220-440 mg by mouth daily as needed (pain)., Disp: , Rfl:    Omega-3 Fatty Acids (FISH OIL MAXIMUM STRENGTH) 1200 MG CAPS, Take 1 capsule by mouth daily., Disp: , Rfl:    ondansetron (ZOFRAN) 4 MG tablet, Take 1 tablet (4 mg total) by mouth every 8 (eight) hours as needed for nausea or vomiting., Disp: 90 tablet, Rfl: 2   oxyCODONE (OXY IR/ROXICODONE) 5 MG immediate release tablet, Take 1 tablet (5 mg  total) by mouth every 6 (six) hours as needed for moderate pain (pain score 4-6)., Disp: 40 tablet, Rfl: 0   rosuvastatin (CRESTOR) 20 MG tablet, TAKE 1 TABLET BY MOUTH EVERY DAY (Patient taking differently: Take 20 mg by mouth daily.), Disp: 90 tablet, Rfl: 1   TRESIBA FLEXTOUCH 100 UNIT/ML SOPN FlexTouch Pen, Inject 64 Units into the skin daily., Disp: , Rfl:    Review of Systems  Constitutional:  Negative for chills and fever.  HENT:  Negative for congestion and sore throat.   Eyes:  Negative for photophobia.  Respiratory:  Negative for cough, shortness of breath and wheezing.   Cardiovascular:  Negative for chest pain, palpitations and leg swelling.  Gastrointestinal:  Negative for abdominal pain, blood in stool, constipation, diarrhea, nausea and vomiting.  Genitourinary:  Negative for dysuria, flank pain and hematuria.  Musculoskeletal:  Positive for arthralgias and joint swelling. Negative for back pain and myalgias.  Skin:  Negative for rash.  Neurological:  Negative for dizziness, weakness and headaches.  Hematological:  Does not bruise/bleed easily.  Psychiatric/Behavioral:  Negative for suicidal ideas.       Knee 10/03/2020:    Knee January 01, 2021: 1 can see the area where he is apply the corticosteroids that is a bit lateral     Knee 05/05/2021:    Objective:   Physical Exam Constitutional:      Appearance: He is well-developed.  HENT:     Head: Normocephalic and atraumatic.  Eyes:     Conjunctiva/sclera: Conjunctivae normal.  Cardiovascular:     Rate and Rhythm: Normal rate and regular rhythm.  Pulmonary:     Effort: Pulmonary effort is normal. No respiratory distress.     Breath sounds: No wheezing.  Abdominal:     General: There is no distension.     Palpations: Abdomen is soft.  Musculoskeletal:        General: Swelling present.     Cervical back: Normal range of motion and neck supple.  Skin:    General: Skin is warm and dry.  Coloration: Skin  is not pale.     Findings: No erythema or rash.  Neurological:     General: No focal deficit present.     Mental Status: He is alert and oriented to person, place, and time.  Psychiatric:        Mood and Affect: Mood normal.        Behavior: Behavior normal.        Thought Content: Thought content normal.        Judgment: Judgment normal.          Assessment & Plan:   Left prosthetic knee infection with MSSA:  Unfortunately appears to have failed attempts at single-stage surgery followed by IV and oral protracted antibiotics.  He  He is scheduled for I&D with resection arthroplasty next week.  Our team needs to see him when he is in the hospital that we can facilitate him getting a PICC line again and presumably going on cefazolin again for 6 weeks if this is again due to his MSSA which I have little doubt that it is.  We will then plan on seeing him during his course of therapy and after he completes antibiotics.  The most compulsive thing would be for surgery to aspirate the knee several weeks after he completes antibiotics to check for cell count the differential and culture to assure ourselves that we have cured this with resection of his prosthetic knee and 6 weeks of antibiotics.    Skin rashes resolved:  HTN:  There were no vitals filed for this visit.  Continue amlodipine and losartan  Insulin requiring diabetes mellitus followed by Dr. Anselmo Pickler

## 2021-05-04 NOTE — Progress Notes (Signed)
HPI: [name] returns today for follow-up of his left knee.  He states knee overall feels worse still swelling.  He is due to have his hemoglobin A1c checked and another labs drawn today.  Again he is known to have an infection in his left knee even though there was no growth from the culture sent.  This is felt to be due to the fact that he is on long-term antibiotic suppression.  His CRP and sed rate were elevated though.   Physical exam: Left knee full extension full flexion.  Slight effusion.  Slight warmth.  No erythema or ecchymosis.  Impression: Left total knee replacement infection   Plan: He will call us once he has his hemoglobin A1c numbers so we can schedule him for surgery again his hemoglobin A1c will need to be under 7.7.  He will keep his appointment with infectious disease as scheduled tomorrow.  Questions were encouraged and answered at length.

## 2021-05-05 ENCOUNTER — Encounter: Payer: Self-pay | Admitting: Infectious Disease

## 2021-05-05 ENCOUNTER — Other Ambulatory Visit: Payer: Self-pay | Admitting: Physician Assistant

## 2021-05-05 ENCOUNTER — Ambulatory Visit: Payer: HMO | Admitting: Infectious Disease

## 2021-05-05 ENCOUNTER — Other Ambulatory Visit: Payer: Self-pay

## 2021-05-05 VITALS — BP 115/65 | HR 65 | Resp 16 | Ht 70.0 in | Wt 200.6 lb

## 2021-05-05 DIAGNOSIS — E1169 Type 2 diabetes mellitus with other specified complication: Secondary | ICD-10-CM

## 2021-05-05 DIAGNOSIS — T8454XD Infection and inflammatory reaction due to internal left knee prosthesis, subsequent encounter: Secondary | ICD-10-CM

## 2021-05-05 DIAGNOSIS — N2889 Other specified disorders of kidney and ureter: Secondary | ICD-10-CM

## 2021-05-05 DIAGNOSIS — I151 Hypertension secondary to other renal disorders: Secondary | ICD-10-CM

## 2021-05-05 DIAGNOSIS — Z7185 Encounter for immunization safety counseling: Secondary | ICD-10-CM

## 2021-05-05 DIAGNOSIS — A4901 Methicillin susceptible Staphylococcus aureus infection, unspecified site: Secondary | ICD-10-CM | POA: Diagnosis not present

## 2021-05-05 NOTE — Patient Instructions (Addendum)
DUE TO COVID-19 ONLY ONE VISITOR IS ALLOWED TO COME WITH YOU AND STAY IN THE WAITING ROOM ONLY DURING PRE OP AND PROCEDURE.   **NO VISITORS ARE ALLOWED IN THE SHORT STAY AREA OR RECOVERY ROOM!!**  IF YOU WILL BE ADMITTED INTO THE HOSPITAL YOU ARE ALLOWED ONLY TWO SUPPORT PEOPLE DURING VISITATION HOURS ONLY (7 AM -8PM)    Up to two visitors ages 42+ are allowed at one time in a patient's room.  The visitors may rotate out with other people throughout the day.  Additionally, up to two children between the ages of 61 and 31 are allowed and do not count toward the number of allowed visitors.  Children within this age range must be accompanied by an adult visitor.  One adult visitor may remain with the patient overnight and must be in the room by 8 PM.  COVID SWAB TESTING MUST BE COMPLETED ON:  05-13-21 @ 8:30 AM  COME IN THROUGH MAIN ENTRANCE of [facility].  Take a seat in the lobby area to the right as you come in the main entrance.  Call [phone]  and give your name and let them know you are here for COVID testing  You are not required to quarantine, however you are required to wear a well-fitted mask when you are out and around people not in your household.  Hand Hygiene often Do NOT share personal items Notify your provider if you are in close contact with someone who has COVID or you develop fever 100.4 or greater, new onset of sneezing, cough, sore throat, shortness of breath or body aches.       Your procedure is scheduled on: Friday, 05-15-21   Report to [facility] Main  Entrance     Report to admitting at 12:15 PM   Call this number if you have problems the morning of surgery [phone]   Do not eat food :After Midnight.   May have liquids until 11:30 AM day of surgery  CLEAR LIQUID DIET  Foods Allowed                                                                     Foods Excluded  Water, Black Coffee (no milk/no creamer) and tea, regular and decaf                               liquids that you cannot  Plain Jell-O in any flavor  (No red)                         see through such as: Fruit ices (not with fruit pulp)                                 milk, soups, orange juice  Iced Popsicles (No red)                                    All solid food  Apple juices Sports drinks like Gatorade (No red) Lightly seasoned clear broth or consume(fat free) Sugar     Complete one G2 drink the morning of surgery at 11:30 AM  the day of surgery.      The day of surgery:  Drink ONE (1) Pre-Surgery  G2 the morning of surgery. Drink in one sitting. Do not sip.  This drink was given to you during your hospital  pre-op appointment visit. Nothing else to drink after completing the Pre-Surgery  G2.          If you have questions, please contact your surgeons office.     Oral Hygiene is also important to reduce your risk of infection.                                    Remember - BRUSH YOUR TEETH THE MORNING OF SURGERY WITH YOUR REGULAR TOOTHPASTE   Do NOT smoke after Midnight   Take these medicines the morning of surgery with A SIP OF WATER: Escitalopram, Rosuvastatin, Cefadroxil  How to Manage Your Diabetes Before and After Surgery  Why is it important to control my blood sugar before and after surgery? Improving blood sugar levels before and after surgery helps healing and can limit problems. A way of improving blood sugar control is eating a healthy diet by:  Eating less sugar and carbohydrates  Increasing activity/exercise  Talking with your doctor about reaching your blood sugar goals High blood sugars (greater than 180 mg/dL) can raise your risk of infections and slow your recovery, so you will need to focus on controlling your diabetes during the weeks before surgery. Make sure that the doctor who takes care of your diabetes knows about your planned surgery including the date and location.  How do I manage my blood  sugar before surgery? Check your blood sugar at least 4 times a day, starting 2 days before surgery, to make sure that the level is not too high or low. Check your blood sugar the morning of your surgery when you wake up and every 2 hours until you get to the Short Stay unit. If your blood sugar is less than 70 mg/dL, you will need to treat for low blood sugar: Do not take insulin. Treat a low blood sugar (less than 70 mg/dL) with  cup of clear juice (cranberry or apple), 4 glucose tablets, OR glucose gel. Recheck blood sugar in 15 minutes after treatment (to make sure it is greater than 70 mg/dL). If your blood sugar is not greater than 70 mg/dL on recheck, call [phone] for further instructions. Report your blood sugar to the short stay nurse when you get to Short Stay.  If you are admitted to the hospital after surgery: Your blood sugar will be checked by the staff and you will probably be given insulin after surgery (instead of oral diabetes medicines) to make sure you have good blood sugar levels. The goal for blood sugar control after surgery is 80-180 mg/dL.   WHAT DO I DO ABOUT MY DIABETES MEDICATION?  Do not take oral diabetes medicines (pills) the morning of surgery.  THE NIGHT BEFORE SURGERY:  Tyler Aas take as prescribed                 Take Novolog as prescribed (no bedtime dose)       THE MORNING OF SURGERY:  Take 50% of Tresiba.  Take 50%  of Novolog if CBG 220.  Reviewed and Endorsed by [facility] Patient Education Committee, August 2015      Stop all vitamins and herbal supplements a week before surgery.     Stop Motrin, Aleve, Ibuprofen a week before surgery.              You may not have any metal on your body including jewelry, and body piercing             Do not wear  lotions, powders, cologne, or deodorant              Men may shave face and neck.   Contacts, dentures or bridgework may not be worn into surgery.  Bring small overnight bag day of  surgery.  Do not bring valuables to the hospital. [place].  Please read over the following fact sheets you were given: IF YOU HAVE QUESTIONS ABOUT YOUR PRE OP INSTRUCTIONS PLEASE CALL [place] - Preparing for Surgery Before surgery, you can play an important role.  Because skin is not sterile, your skin needs to be as free of germs as possible.  You can reduce the number of germs on your skin by washing with CHG (chlorahexidine gluconate) soap before surgery.  CHG is an antiseptic cleaner which kills germs and bonds with the skin to continue killing germs even after washing. Please DO NOT use if you have an allergy to CHG or antibacterial soaps.  If your skin becomes reddened/irritated stop using the CHG and inform your nurse when you arrive at Short Stay. Do not shave (including legs and underarms) for at least 48 hours prior to the first CHG shower.  You may shave your face/neck.  Please follow these instructions carefully:  1.  Shower with CHG Soap the night before surgery and the  morning of surgery.  2.  If you choose to wash your hair, wash your hair first as usual with your normal  shampoo.  3.  After you shampoo, rinse your hair and body thoroughly to remove the shampoo.                             4.  Use CHG as you would any other liquid soap.  You can apply chg directly to the skin and wash.  Gently with a scrungie or clean washcloth.  5.  Apply the CHG Soap to your body ONLY FROM THE NECK DOWN.   Do   not use on face/ open                           Wound or open sores. Avoid contact with eyes, ears mouth and   genitals (private parts).                       Wash face,  Genitals (private parts) with your normal soap.             6.  Wash thoroughly, paying special attention to the area where your    surgery  will be performed.  7.  Thoroughly rinse your body with warm water from the neck down.  8.  DO NOT shower/wash with your  normal soap after using and rinsing off the CHG Soap.  9.  Pat yourself dry with a clean towel.            10.  Wear clean pajamas.            11.  Place clean sheets on your bed the night of your first shower and do not  sleep with pets. Day of Surgery : Do not apply any lotions/deodorants the morning of surgery.  Please wear clean clothes to the hospital/surgery center.  FAILURE TO FOLLOW THESE INSTRUCTIONS MAY RESULT IN THE CANCELLATION OF YOUR SURGERY  PATIENT SIGNATURE_________________________________  NURSE SIGNATURE__________________________________  ________________________________________________________________________   Adam Phenix  An incentive spirometer is a tool that can help keep your lungs clear and active. This tool measures how well you are filling your lungs with each breath. Taking long deep breaths may help reverse or decrease the chance of developing breathing (pulmonary) problems (especially infection) following: A long period of time when you are unable to move or be active. BEFORE THE PROCEDURE  If the spirometer includes an indicator to show your best effort, your nurse or respiratory therapist will set it to a desired goal. If possible, sit up straight or lean slightly forward. Try not to slouch. Hold the incentive spirometer in an upright position. INSTRUCTIONS FOR USE  Sit on the edge of your bed if possible, or sit up as far as you can in bed or on a chair. Hold the incentive spirometer in an upright position. Breathe out normally. Place the mouthpiece in your mouth and seal your lips tightly around it. Breathe in slowly and as deeply as possible, raising the piston or the ball toward the top of the column. Hold your breath for 3-5 seconds or for as long as possible. Allow the piston or ball to fall to the bottom of the column. Remove the mouthpiece from your mouth and breathe out normally. Rest for a few seconds and repeat Steps 1  through 7 at least 10 times every 1-2 hours when you are awake. Take your time and take a few normal breaths between deep breaths. The spirometer may include an indicator to show your best effort. Use the indicator as a goal to work toward during each repetition. After each set of 10 deep breaths, practice coughing to be sure your lungs are clear. If you have an incision (the cut made at the time of surgery), support your incision when coughing by placing a pillow or rolled up towels firmly against it. Once you are able to get out of bed, walk around indoors and cough well. You may stop using the incentive spirometer when instructed by your caregiver.  RISKS AND COMPLICATIONS Take your time so you do not get dizzy or light-headed. If you are in pain, you may need to take or ask for pain medication before doing incentive spirometry. It is harder to take a deep breath if you are having pain. AFTER USE Rest and breathe slowly and easily. It can be helpful to keep track of a log of your progress. Your caregiver can provide you with a simple table to help with this. If you are using the spirometer at home, follow these instructions: [place] IF:  You are having difficultly using the spirometer. You have trouble using the spirometer as often as instructed. Your pain medication is not giving enough relief while using the spirometer. You develop fever of 100.5 F (38.1 C) or higher. SEEK IMMEDIATE MEDICAL CARE IF:  You cough up bloody sputum that had  not been present before. You develop fever of 102 F (38.9 C) or greater. You develop worsening pain at or near the incision site. MAKE SURE YOU:  Understand these instructions. Will watch your condition. Will get help right away if you are not doing well or get worse. Document Released: 07/19/2006 Document Revised: 05/31/2011 Document Reviewed: 09/19/2006 ExitCare Patient Information 2014 ExitCare,  [place].   ________________________________________________________________________  WHAT IS A BLOOD TRANSFUSION? Blood Transfusion Information  A transfusion is the replacement of blood or some of its parts. Blood is made up of multiple cells which provide different functions. Red blood cells carry oxygen and are used for blood loss replacement. White blood cells fight against infection. Platelets control bleeding. Plasma helps clot blood. Other blood products are available for specialized needs, such as hemophilia or other clotting disorders. BEFORE THE TRANSFUSION  Who gives blood for transfusions?  Healthy volunteers who are fully evaluated to make sure their blood is safe. This is blood bank blood. Transfusion therapy is the safest it has ever been in the practice of medicine. Before blood is taken from a donor, a complete history is taken to make sure that person has no history of diseases nor engages in risky social behavior (examples are intravenous drug use or sexual activity with multiple partners). The donor's travel history is screened to minimize risk of transmitting infections, such as malaria. The donated blood is tested for signs of infectious diseases, such as HIV and hepatitis. The blood is then tested to be sure it is compatible with you in order to minimize the chance of a transfusion reaction. If you or a relative donates blood, this is often done in anticipation of surgery and is not appropriate for emergency situations. It takes many days to process the donated blood. RISKS AND COMPLICATIONS Although transfusion therapy is very safe and saves many lives, the main dangers of transfusion include:  Getting an infectious disease. Developing a transfusion reaction. This is an allergic reaction to something in the blood you were given. Every precaution is taken to prevent this. The decision to have a blood transfusion has been considered carefully by your caregiver before blood is  given. Blood is not given unless the benefits outweigh the risks. AFTER THE TRANSFUSION Right after receiving a blood transfusion, you will usually feel much better and more energetic. This is especially true if your red blood cells have gotten low (anemic). The transfusion raises the level of the red blood cells which carry oxygen, and this usually causes an energy increase. The nurse administering the transfusion will monitor you carefully for complications. HOME CARE INSTRUCTIONS  No special instructions are needed after a transfusion. You may find your energy is better. Speak with your caregiver about any limitations on activity for underlying diseases you may have. SEEK MEDICAL CARE IF:  Your condition is not improving after your transfusion. You develop redness or irritation at the intravenous (IV) site. SEEK IMMEDIATE MEDICAL CARE IF:  Any of the following symptoms occur over the next 12 hours: Shaking chills. You have a temperature by mouth above 102 F (38.9 C), not controlled by medicine. Chest, back, or muscle pain. People around you feel you are not acting correctly or are confused. Shortness of breath or difficulty breathing. Dizziness and fainting. You get a rash or develop hives. You have a decrease in urine output. Your urine turns a dark color or changes to pink, red, or brown. Any of the following symptoms occur over the next 10 days:  You have a temperature by mouth above 102 F (38.9 C), not controlled by medicine. Shortness of breath. Weakness after normal activity. The white part of the eye turns yellow (jaundice). You have a decrease in the amount of urine or are urinating less often. Your urine turns a dark color or changes to pink, red, or brown. Document Released: 03/05/2000 Document Revised: 05/31/2011 Document Reviewed: 10/23/2007 [facility] Patient Information 2014 [place], [place].  _______________________________________________________________________

## 2021-05-05 NOTE — Progress Notes (Addendum)
COVID swab appointment: 05-13-21 @ 8:30  COVID Vaccine Completed: No Date COVID Vaccine completed: Has received booster: COVID vaccine manufacturer: [place]   Date of COVID positive in last 90 days: No  PCP -  [name] MD Cardiologist - Jeri Lager, NP for hypertriglyceridemia  Chest x-ray - N/A EKG - 05-19-20 Epic Stress Test - greater than 2 years Epic ECHO - greater than 2 years Epic Cardiac Cath - N/A Pacemaker/ICD device last checked: Spinal Cord Stimulator:  Bowel Prep -  N/A  Sleep Study - N/A CPAP -   Freestyle Libre - R arm  Fasting Blood Sugar - 100 to 200  Checks Blood Sugar 4 to 5  times a day  Blood Thinner Instructions: N/A Aspirin Instructions:  Last Dose:  Activity level:   Can go up a flight of stairs and perform activities of daily living without stopping and without symptoms of chest pain or shortness of breath.     Anesthesia review: N/A  Patient denies shortness of breath, fever, cough and chest pain at PAT appointment  Patient verbalized understanding of instructions that were given to them at the PAT appointment. Patient was also instructed that they will need to review over the PAT instructions again at home before surgery.

## 2021-05-06 ENCOUNTER — Other Ambulatory Visit: Payer: Self-pay

## 2021-05-06 ENCOUNTER — Encounter (HOSPITAL_COMMUNITY)
Admission: RE | Admit: 2021-05-06 | Discharge: 2021-05-06 | Disposition: A | Discharge status: Home / Self Care | Payer: HMO | Source: Ambulatory Visit | Attending: Orthopaedic Surgery | Admitting: Orthopaedic Surgery

## 2021-05-06 ENCOUNTER — Encounter (HOSPITAL_COMMUNITY): Payer: Self-pay

## 2021-05-06 VITALS — BP 136/71 | HR 64 | Temp 98.4°F | Resp 16 | Ht 68.0 in | Wt 198.4 lb

## 2021-05-06 DIAGNOSIS — Z01818 Encounter for other preprocedural examination: Secondary | ICD-10-CM

## 2021-05-06 DIAGNOSIS — I2510 Atherosclerotic heart disease of native coronary artery without angina pectoris: Secondary | ICD-10-CM

## 2021-05-06 DIAGNOSIS — Z01812 Encounter for preprocedural laboratory examination: Secondary | ICD-10-CM | POA: Diagnosis not present

## 2021-05-06 DIAGNOSIS — E119 Type 2 diabetes mellitus without complications: Secondary | ICD-10-CM | POA: Diagnosis not present

## 2021-05-06 DIAGNOSIS — T8454XD Infection and inflammatory reaction due to internal left knee prosthesis, subsequent encounter: Secondary | ICD-10-CM

## 2021-05-06 DIAGNOSIS — Z794 Long term (current) use of insulin: Secondary | ICD-10-CM

## 2021-05-06 LAB — BASIC METABOLIC PANEL
Anion gap: 7 (ref 5–15)
BUN: 23 mg/dL (ref 8–23)
CO2: 26 mmol/L (ref 22–32)
Calcium: 9 mg/dL (ref 8.9–10.3)
Chloride: 107 mmol/L (ref 98–111)
Creatinine, Ser: 0.86 mg/dL (ref 0.61–1.24)
GFR, Estimated: >60 mL/min (ref >60)
Glucose, Bld: 136 mg/dL — ABNORMAL HIGH (ref 70–99)
Potassium: 4.5 mmol/L (ref 3.5–5.1)
Sodium: 140 mmol/L (ref 135–145)

## 2021-05-06 LAB — CBC
HCT: 39 % (ref 39.0–52.0)
Hemoglobin: 13.2 g/dL (ref 13.0–17.0)
MCH: 29.5 pg (ref 26.0–34.0)
MCHC: 33.8 g/dL (ref 30.0–36.0)
MCV: 87.1 fL (ref 80.0–100.0)
Platelets: 169 10*3/uL (ref 150–400)
RBC: 4.48 MIL/uL (ref 4.22–5.81)
RDW: 12.7 % (ref 11.5–15.5)
WBC: 5.6 10*3/uL (ref 4.0–10.5)
nRBC: 0 % (ref 0.0–0.2)

## 2021-05-06 LAB — GLUCOSE, CAPILLARY: Glucose-Capillary: 123 mg/dL — ABNORMAL HIGH (ref 70–99)

## 2021-05-06 LAB — SURGICAL PCR SCREEN
MRSA, PCR: NEGATIVE
Staphylococcus aureus: NEGATIVE

## 2021-05-13 ENCOUNTER — Encounter (HOSPITAL_COMMUNITY)
Admission: RE | Admit: 2021-05-13 | Discharge: 2021-05-13 | Disposition: A | Discharge status: Home / Self Care | Payer: HMO | Source: Ambulatory Visit | Attending: Orthopaedic Surgery | Admitting: Orthopaedic Surgery

## 2021-05-13 ENCOUNTER — Other Ambulatory Visit: Payer: Self-pay

## 2021-05-13 ENCOUNTER — Telehealth: Payer: Self-pay | Admitting: Orthopaedic Surgery

## 2021-05-13 LAB — SARS CORONAVIRUS 2 (TAT 6-24 HRS): SARS Coronavirus 2: NEGATIVE

## 2021-05-13 NOTE — Telephone Encounter (Signed)
Wife called. Wanting to know how long he is suppose to be staying in the hospital after surgery.

## 2021-05-13 NOTE — Telephone Encounter (Signed)
Per Judeen Hammans pt called her and she advised

## 2021-05-13 NOTE — Telephone Encounter (Signed)
Lvm for pt to cb to advise 

## 2021-05-13 NOTE — Telephone Encounter (Signed)
Please advise 

## 2021-05-14 NOTE — H&P (Signed)
[name] is an 66 y.o. male.   Chief Complaint:   Left knee swelling with recurrent effusion HPI: The patient is a 66 year old gentleman who underwent a primary total knee arthroplasty in late 2020.  That knee had done well but unfortunately in May 2022 he had to be taken to the operating room with an acute infection involving his left knee.  He has been undergoing treatment for prostate cancer and also was dealing with poorly controlled diabetes.  He was found to have a methicillin sensitive/susceptible Staph aureus infection in that knee and a synovectomy and polyliner exchange was performed.  He was then on long-term IV antibiotics followed by oral antibiotics.  He had been doing well until recently he developed knee swelling again.  We aspirated a large effusion from his left knee and this is concerning for continued infection.  He also has elevated inflammatory markers with a CRP and sed rate being high.  At this point we have recommended an excision arthroplasty of his left knee with removing all components and placing a temporary antibiotic spacer.  He understands he will need prolonged IV antibiotics once again.  Past Medical History:  Diagnosis Date   Arthritis    Depression    Diabetes mellitus without complication ([place]) 6144   Diabetes Type II   GERD (gastroesophageal reflux disease)    diet controlled   HTN (hypertension) 10/27/2018   Hyperlipidemia    Internal hemorrhoids 2014   noted on colonoscopy   MSSA (methicillin susceptible Staphylococcus aureus) infection 09/03/2020   Osteoarthritis of both knees    Prostate cancer ([place])    prostate   Sigmoid diverticulosis 2014   Mild, noted on colonoscopy   Skin lesion 01/01/2021   Type 2 diabetes mellitus with other specified complication ([place]) 31/54/0086   Vaccine counseling 05/04/2021    Past Surgical History:  Procedure Laterality Date   CARPAL TUNNEL RELEASE Right 02/22/2019   Procedure: CARPAL TUNNEL RELEASE;  Surgeon:  Carole Civil, MD;  Location: [facility];  Service: Orthopedics;  Laterality: Right;   COLONOSCOPY N/A 09/13/2012   Procedure: COLONOSCOPY;  Surgeon: Danie Binder, MD;  Location: [facility] SU[facility];  Service: Endoscopy;  Laterality: N/A;  9:30 AM   CYSTOSCOPY WITH URETHRAL DILATATION N/A 06/02/2020   Procedure: CYSTOSCOPY WITH URETHRAL DILATATION;  Surgeon: [name], MD;  Location: [facility];  Service: Urology;  Laterality: N/A;  ONLY NEEDS 30 MIN   HERNIA REPAIR     20 years ago   I & D KNEE WITH POLY EXCHANGE Left 08/14/2020   Procedure: IRRIGATION AND DEBRIDEMENT LEFT KNEE with synovectomy WITH POLY EXCHANGE;  Surgeon: Mcarthur Rossetti, MD;  Location: [facility];  Service: Orthopedics;  Laterality: Left;   KNEE ARTHROSCOPY Left    15 years ago   LYMPHADENECTOMY Bilateral 05/30/2017   Procedure: LYMPHADENECTOMY, PELVIC EXTENDED;  Surgeon: [name], MD;  Location: [facility];  Service: Urology;  Laterality: Bilateral;   ROBOT ASSISTED LAPAROSCOPIC RADICAL PROSTATECTOMY N/A 05/30/2017   Procedure: XI ROBOTIC ASSISTED LAPAROSCOPIC RADICAL PROSTATECTOMY LEVEL 3;  Surgeon: [name], MD;  Location: [facility];  Service: Urology;  Laterality: N/A;   SHOULDER ARTHROSCOPY Right    15 years ago   TOTAL KNEE ARTHROPLASTY Left 03/21/2019   Procedure: LEFT TOTAL KNEE ARTHROPLASTY;  Surgeon: Mcarthur Rossetti, MD;  Location: [facility];  Service: Orthopedics;  Laterality: Left;    Family History  Problem Relation Age of Onset   Cancer Mother  small cell lung   Social History:  reports that he has never smoked. He has never used smokeless tobacco. He reports that he does not drink alcohol and does not use drugs.  Allergies: No Known Allergies  No medications prior to admission.    No results found for this or any previous visit (from the past 48 hour(s)). No results found.  Review of Systems  Musculoskeletal:  Positive for joint swelling.  All other systems reviewed and are  negative.  There were no vitals taken for this visit. Physical Exam Vitals reviewed.  Constitutional:      Appearance: Normal appearance.  HENT:     Head: Normocephalic and atraumatic.  Eyes:     Extraocular Movements: Extraocular movements intact.     Pupils: Pupils are equal, round, and reactive to light.  Cardiovascular:     Rate and Rhythm: Normal rate.  Pulmonary:     Breath sounds: Normal breath sounds.  Abdominal:     Palpations: Abdomen is soft.  Musculoskeletal:     Cervical back: Normal range of motion and neck supple.     Left knee: Effusion present.  Neurological:     Mental Status: He is alert and oriented to person, place, and time.  Psychiatric:        Behavior: Behavior normal.     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  He will be admitted as an inpatient for resuming IV antibiotics and PICC line placement.  The risks and benefits of surgery been explained in detail.  Mcarthur Rossetti, MD 05/14/2021, 7:15 PM

## 2021-05-15 ENCOUNTER — Inpatient Hospital Stay (HOSPITAL_COMMUNITY)
Admission: RE | Admit: 2021-05-15 | Discharge: 2021-05-19 | DRG: 468 | Disposition: A | Discharge status: Home Health | Payer: HMO | Attending: Orthopaedic Surgery | Admitting: Orthopaedic Surgery

## 2021-05-15 ENCOUNTER — Encounter (HOSPITAL_COMMUNITY)
Admission: RE | Disposition: A | Discharge status: Home Health | Payer: Self-pay | Source: Home / Self Care | Attending: Orthopaedic Surgery

## 2021-05-15 ENCOUNTER — Other Ambulatory Visit: Payer: Self-pay

## 2021-05-15 ENCOUNTER — Encounter (HOSPITAL_COMMUNITY): Payer: Self-pay | Admitting: Orthopaedic Surgery

## 2021-05-15 ENCOUNTER — Inpatient Hospital Stay (HOSPITAL_COMMUNITY): Payer: HMO | Admitting: Anesthesiology

## 2021-05-15 DIAGNOSIS — Z8546 Personal history of malignant neoplasm of prostate: Secondary | ICD-10-CM | POA: Diagnosis not present

## 2021-05-15 DIAGNOSIS — Z20822 Contact with and (suspected) exposure to covid-19: Secondary | ICD-10-CM | POA: Diagnosis present

## 2021-05-15 DIAGNOSIS — E785 Hyperlipidemia, unspecified: Secondary | ICD-10-CM | POA: Diagnosis present

## 2021-05-15 DIAGNOSIS — I10 Essential (primary) hypertension: Secondary | ICD-10-CM

## 2021-05-15 DIAGNOSIS — Z801 Family history of malignant neoplasm of trachea, bronchus and lung: Secondary | ICD-10-CM | POA: Diagnosis not present

## 2021-05-15 DIAGNOSIS — T8454XA Infection and inflammatory reaction due to internal left knee prosthesis, initial encounter: Secondary | ICD-10-CM | POA: Diagnosis present

## 2021-05-15 DIAGNOSIS — E1165 Type 2 diabetes mellitus with hyperglycemia: Secondary | ICD-10-CM | POA: Diagnosis present

## 2021-05-15 DIAGNOSIS — F32A Depression, unspecified: Secondary | ICD-10-CM | POA: Diagnosis present

## 2021-05-15 DIAGNOSIS — E119 Type 2 diabetes mellitus without complications: Secondary | ICD-10-CM

## 2021-05-15 DIAGNOSIS — Z9079 Acquired absence of other genital organ(s): Secondary | ICD-10-CM

## 2021-05-15 DIAGNOSIS — Y831 Surgical operation with implant of artificial internal device as the cause of abnormal reaction of the patient, or of later complication, without mention of misadventure at the time of the procedure: Secondary | ICD-10-CM | POA: Diagnosis present

## 2021-05-15 DIAGNOSIS — Z01812 Encounter for preprocedural laboratory examination: Secondary | ICD-10-CM

## 2021-05-15 DIAGNOSIS — M1711 Unilateral primary osteoarthritis, right knee: Secondary | ICD-10-CM | POA: Diagnosis present

## 2021-05-15 DIAGNOSIS — T8454XD Infection and inflammatory reaction due to internal left knee prosthesis, subsequent encounter: Secondary | ICD-10-CM | POA: Diagnosis not present

## 2021-05-15 DIAGNOSIS — Z7984 Long term (current) use of oral hypoglycemic drugs: Secondary | ICD-10-CM | POA: Diagnosis not present

## 2021-05-15 DIAGNOSIS — B9561 Methicillin susceptible Staphylococcus aureus infection as the cause of diseases classified elsewhere: Secondary | ICD-10-CM | POA: Diagnosis present

## 2021-05-15 HISTORY — PX: EXCISIONAL TOTAL KNEE ARTHROPLASTY WITH ANTIBIOTIC SPACERS: SHX5827

## 2021-05-15 LAB — TYPE AND SCREEN
ABO/RH(D): O POS
Antibody Screen: NEGATIVE

## 2021-05-15 LAB — GLUCOSE, CAPILLARY
Glucose-Capillary: 91 mg/dL (ref 70–99)
Glucose-Capillary: 53 mg/dL — ABNORMAL LOW (ref 70–99)
Glucose-Capillary: 109 mg/dL — ABNORMAL HIGH (ref 70–99)
Glucose-Capillary: 72 mg/dL (ref 70–99)
Glucose-Capillary: 151 mg/dL — ABNORMAL HIGH (ref 70–99)
Glucose-Capillary: 75 mg/dL (ref 70–99)
Glucose-Capillary: 53 mg/dL — ABNORMAL LOW (ref 70–99)
Glucose-Capillary: 66 mg/dL — ABNORMAL LOW (ref 70–99)
Glucose-Capillary: 112 mg/dL — ABNORMAL HIGH (ref 70–99)

## 2021-05-15 SURGERY — REMOVAL, TOTAL ARTHROPLASTY HARDWARE, KNEE, WITH ANTIBIOTIC SPACER INSERTION
Anesthesia: Spinal | Site: Knee | Laterality: Left

## 2021-05-15 MED ORDER — ACETAMINOPHEN 325 MG PO TABS: 325.0000 mg | ORAL_TABLET | Freq: Four times a day (QID) | ORAL | Status: DC | PRN

## 2021-05-15 MED ORDER — PANTOPRAZOLE SODIUM 40 MG PO TBEC
40.0000 mg | DELAYED_RELEASE_TABLET | Freq: Every day | ORAL | Status: DC
  Administered 2021-05-15 – 2021-05-19 (×5): 40 mg via ORAL
  Filled 2021-05-15 (×5): qty 1

## 2021-05-15 MED ORDER — ONDANSETRON HCL 4 MG PO TABS
4.0000 mg | ORAL_TABLET | Freq: Four times a day (QID) | ORAL | Status: DC | PRN
  Filled 2021-05-15: qty 1

## 2021-05-15 MED ORDER — FENTANYL CITRATE PF 50 MCG/ML IJ SOSY
25.0000 ug | PREFILLED_SYRINGE | INTRAMUSCULAR | Status: DC | PRN
  Administered 2021-05-15 (×2): 50 ug via INTRAVENOUS

## 2021-05-15 MED ORDER — SODIUM CHLORIDE 0.9 % IR SOLN
Status: DC | PRN
  Administered 2021-05-15: 1000 mL
  Administered 2021-05-15: 3000 mL

## 2021-05-15 MED ORDER — ALBUMIN HUMAN 5 % IV SOLN
INTRAVENOUS | Status: AC
  Filled 2021-05-15: qty 250

## 2021-05-15 MED ORDER — PROPOFOL 1000 MG/100ML IV EMUL
INTRAVENOUS | Status: AC
  Filled 2021-05-15: qty 100

## 2021-05-15 MED ORDER — FENTANYL CITRATE PF 50 MCG/ML IJ SOSY
PREFILLED_SYRINGE | INTRAMUSCULAR | Status: AC
  Filled 2021-05-15: qty 1

## 2021-05-15 MED ORDER — AMISULPRIDE (ANTIEMETIC) 5 MG/2ML IV SOLN: 10.0000 mg | Freq: Once | INTRAVENOUS | Status: DC | PRN

## 2021-05-15 MED ORDER — ONDANSETRON HCL 4 MG/2ML IJ SOLN: 4.0000 mg | Freq: Four times a day (QID) | INTRAMUSCULAR | Status: DC | PRN

## 2021-05-15 MED ORDER — PROPOFOL 500 MG/50ML IV EMUL
INTRAVENOUS | Status: AC
  Filled 2021-05-15: qty 50

## 2021-05-15 MED ORDER — CEFAZOLIN SODIUM-DEXTROSE 2-4 GM/100ML-% IV SOLN
2.0000 g | Freq: Three times a day (TID) | INTRAVENOUS | Status: DC
  Administered 2021-05-15 – 2021-05-19 (×12): 2 g via INTRAVENOUS
  Filled 2021-05-15 (×12): qty 100

## 2021-05-15 MED ORDER — HYDROMORPHONE HCL 1 MG/ML IJ SOLN
INTRAMUSCULAR | Status: AC
  Filled 2021-05-15: qty 1

## 2021-05-15 MED ORDER — MEPERIDINE HCL 50 MG/ML IJ SOLN: 6.2500 mg | INTRAMUSCULAR | Status: DC | PRN

## 2021-05-15 MED ORDER — BUPIVACAINE IN DEXTROSE 0.75-8.25 % IT SOLN
INTRATHECAL | Status: DC | PRN
  Administered 2021-05-15: 2 mL via INTRATHECAL

## 2021-05-15 MED ORDER — VANCOMYCIN HCL 1000 MG IV SOLR
INTRAVENOUS | Status: DC | PRN
  Administered 2021-05-15: 2 g

## 2021-05-15 MED ORDER — DEXTROSE 50 % IV SOLN: 25.0000 mL | Freq: Once | INTRAVENOUS | Status: AC

## 2021-05-15 MED ORDER — MIDAZOLAM HCL 2 MG/2ML IJ SOLN
INTRAMUSCULAR | Status: AC
  Filled 2021-05-15: qty 2

## 2021-05-15 MED ORDER — POVIDONE-IODINE 10 % EX SWAB
2.0000 "application " | Freq: Once | CUTANEOUS | Status: AC
  Administered 2021-05-15: 2 via TOPICAL

## 2021-05-15 MED ORDER — VANCOMYCIN HCL 1000 MG IV SOLR
INTRAVENOUS | Status: AC
  Filled 2021-05-15: qty 40

## 2021-05-15 MED ORDER — OXYCODONE HCL 5 MG/5ML PO SOLN: 5.0000 mg | Freq: Once | ORAL | Status: DC | PRN

## 2021-05-15 MED ORDER — DEXTROSE 50 % IV SOLN
25.0000 mL | Freq: Once | INTRAVENOUS | Status: AC
  Administered 2021-05-15: 25 mL via INTRAVENOUS

## 2021-05-15 MED ORDER — OXYCODONE HCL 5 MG PO TABS
10.0000 mg | ORAL_TABLET | ORAL | Status: DC | PRN
  Administered 2021-05-16 – 2021-05-17 (×4): 15 mg via ORAL
  Administered 2021-05-18 (×2): 10 mg via ORAL
  Administered 2021-05-18: 15 mg via ORAL
  Filled 2021-05-15 (×6): qty 3

## 2021-05-15 MED ORDER — HYDROMORPHONE HCL 1 MG/ML IJ SOLN
0.5000 mg | INTRAMUSCULAR | Status: DC | PRN
  Administered 2021-05-15 (×2): 1 mg via INTRAVENOUS
  Filled 2021-05-15: qty 1

## 2021-05-15 MED ORDER — CEFAZOLIN SODIUM-DEXTROSE 2-4 GM/100ML-% IV SOLN
INTRAVENOUS | Status: AC
  Filled 2021-05-15: qty 100

## 2021-05-15 MED ORDER — DEXTROSE 50 % IV SOLN
INTRAVENOUS | Status: AC
  Administered 2021-05-15: 25 mL via INTRAVENOUS
  Filled 2021-05-15: qty 50

## 2021-05-15 MED ORDER — METHOCARBAMOL 500 MG PO TABS
500.0000 mg | ORAL_TABLET | Freq: Four times a day (QID) | ORAL | Status: DC | PRN
  Administered 2021-05-15 – 2021-05-18 (×7): 500 mg via ORAL
  Filled 2021-05-15 (×9): qty 1

## 2021-05-15 MED ORDER — PHENYLEPHRINE HCL-NACL 20-0.9 MG/250ML-% IV SOLN
INTRAVENOUS | Status: DC | PRN
  Administered 2021-05-15: 25 ug/min via INTRAVENOUS

## 2021-05-15 MED ORDER — PHENYLEPHRINE HCL (PRESSORS) 10 MG/ML IV SOLN
INTRAVENOUS | Status: AC
  Filled 2021-05-15: qty 1

## 2021-05-15 MED ORDER — DEXAMETHASONE SODIUM PHOSPHATE 10 MG/ML IJ SOLN
INTRAMUSCULAR | Status: AC
  Filled 2021-05-15: qty 1

## 2021-05-15 MED ORDER — CEFADROXIL 500 MG PO CAPS
1000.0000 mg | ORAL_CAPSULE | Freq: Two times a day (BID) | ORAL | Status: DC
  Administered 2021-05-16: 1000 mg via ORAL
  Filled 2021-05-15: qty 2

## 2021-05-15 MED ORDER — DOCUSATE SODIUM 100 MG PO CAPS
100.0000 mg | ORAL_CAPSULE | Freq: Two times a day (BID) | ORAL | Status: DC
  Administered 2021-05-15 – 2021-05-19 (×8): 100 mg via ORAL
  Filled 2021-05-15 (×8): qty 1

## 2021-05-15 MED ORDER — ALUM & MAG HYDROXIDE-SIMETH 200-200-20 MG/5ML PO SUSP: 30.0000 mL | ORAL | Status: DC | PRN

## 2021-05-15 MED ORDER — ESCITALOPRAM OXALATE 10 MG PO TABS
10.0000 mg | ORAL_TABLET | Freq: Every day | ORAL | Status: DC
  Administered 2021-05-15 – 2021-05-19 (×5): 10 mg via ORAL
  Filled 2021-05-15 (×5): qty 1

## 2021-05-15 MED ORDER — STERILE WATER FOR IRRIGATION IR SOLN
Status: DC | PRN
  Administered 2021-05-15: 2000 mL

## 2021-05-15 MED ORDER — ACETAMINOPHEN 500 MG PO TABS
1000.0000 mg | ORAL_TABLET | Freq: Once | ORAL | Status: AC
  Administered 2021-05-15: 1000 mg via ORAL
  Filled 2021-05-15: qty 2

## 2021-05-15 MED ORDER — METOCLOPRAMIDE HCL 5 MG/ML IJ SOLN: 5.0000 mg | Freq: Three times a day (TID) | INTRAMUSCULAR | Status: DC | PRN

## 2021-05-15 MED ORDER — FENTANYL CITRATE (PF) 100 MCG/2ML IJ SOLN
INTRAMUSCULAR | Status: DC | PRN
  Administered 2021-05-15 (×2): 50 ug via INTRAVENOUS

## 2021-05-15 MED ORDER — MIDAZOLAM HCL 5 MG/5ML IJ SOLN
INTRAMUSCULAR | Status: DC | PRN
  Administered 2021-05-15: 2 mg via INTRAVENOUS

## 2021-05-15 MED ORDER — ONDANSETRON HCL 4 MG/2ML IJ SOLN
INTRAMUSCULAR | Status: DC | PRN
Start: 2021-05-15 — End: 2021-05-15
  Administered 2021-05-15: 4 mg via INTRAVENOUS

## 2021-05-15 MED ORDER — OXYCODONE HCL 5 MG PO TABS
5.0000 mg | ORAL_TABLET | ORAL | Status: DC | PRN
  Administered 2021-05-15: 10 mg via ORAL
  Administered 2021-05-17: 5 mg via ORAL
  Administered 2021-05-19: 10 mg via ORAL
  Filled 2021-05-15: qty 1
  Filled 2021-05-15 (×4): qty 2

## 2021-05-15 MED ORDER — DIPHENHYDRAMINE HCL 12.5 MG/5ML PO ELIX: 12.5000 mg | ORAL_SOLUTION | ORAL | Status: DC | PRN

## 2021-05-15 MED ORDER — METOCLOPRAMIDE HCL 5 MG PO TABS
5.0000 mg | ORAL_TABLET | Freq: Three times a day (TID) | ORAL | Status: DC | PRN
  Filled 2021-05-15: qty 2

## 2021-05-15 MED ORDER — PROPOFOL 500 MG/50ML IV EMUL
INTRAVENOUS | Status: DC | PRN
  Administered 2021-05-15: 100 ug/kg/min via INTRAVENOUS

## 2021-05-15 MED ORDER — INSULIN GLARGINE-YFGN 100 UNIT/ML ~~LOC~~ SOLN
80.0000 [IU] | Freq: Every day | SUBCUTANEOUS | Status: DC
  Administered 2021-05-15 – 2021-05-16 (×2): 80 [IU] via SUBCUTANEOUS
  Filled 2021-05-15 (×3): qty 0.8

## 2021-05-15 MED ORDER — ORAL CARE MOUTH RINSE: 15.0000 mL | Freq: Once | OROMUCOSAL | Status: AC

## 2021-05-15 MED ORDER — PROPOFOL 10 MG/ML IV BOLUS
INTRAVENOUS | Status: AC
  Filled 2021-05-15: qty 20

## 2021-05-15 MED ORDER — SODIUM CHLORIDE 0.9 % IV SOLN: INTRAVENOUS | Status: DC

## 2021-05-15 MED ORDER — CHLORHEXIDINE GLUCONATE 0.12 % MT SOLN
15.0000 mL | Freq: Once | OROMUCOSAL | Status: AC
  Administered 2021-05-15: 15 mL via OROMUCOSAL

## 2021-05-15 MED ORDER — PHENOL 1.4 % MT LIQD: 1.0000 | OROMUCOSAL | Status: DC | PRN

## 2021-05-15 MED ORDER — HYDROCHLOROTHIAZIDE 12.5 MG PO TABS
12.5000 mg | ORAL_TABLET | Freq: Every day | ORAL | Status: DC
  Administered 2021-05-15 – 2021-05-18 (×4): 12.5 mg via ORAL
  Filled 2021-05-15 (×5): qty 1

## 2021-05-15 MED ORDER — INSULIN ASPART 100 UNIT/ML IJ SOLN: 10.0000 [IU] | Freq: Three times a day (TID) | INTRAMUSCULAR | Status: DC

## 2021-05-15 MED ORDER — PROPOFOL 10 MG/ML IV BOLUS
INTRAVENOUS | Status: DC | PRN
  Administered 2021-05-15: 20 mg via INTRAVENOUS

## 2021-05-15 MED ORDER — TOBRAMYCIN SULFATE 1.2 G IJ SOLR
INTRAMUSCULAR | Status: AC
  Filled 2021-05-15: qty 2.4

## 2021-05-15 MED ORDER — ASPIRIN 81 MG PO CHEW
81.0000 mg | CHEWABLE_TABLET | Freq: Two times a day (BID) | ORAL | Status: DC
  Administered 2021-05-15 – 2021-05-19 (×8): 81 mg via ORAL
  Filled 2021-05-15 (×8): qty 1

## 2021-05-15 MED ORDER — ROPIVACAINE HCL 5 MG/ML IJ SOLN
INTRAMUSCULAR | Status: DC | PRN
  Administered 2021-05-15: 30 mL via PERINEURAL

## 2021-05-15 MED ORDER — LOSARTAN POTASSIUM-HCTZ 50-12.5 MG PO TABS: 1.0000 | ORAL_TABLET | Freq: Every day | ORAL | Status: DC

## 2021-05-15 MED ORDER — ONDANSETRON HCL 4 MG/2ML IJ SOLN
INTRAMUSCULAR | Status: AC
  Filled 2021-05-15: qty 2

## 2021-05-15 MED ORDER — FENTANYL CITRATE PF 50 MCG/ML IJ SOSY
50.0000 ug | PREFILLED_SYRINGE | INTRAMUSCULAR | Status: DC
  Administered 2021-05-15: 50 ug via INTRAVENOUS
  Filled 2021-05-15: qty 2

## 2021-05-15 MED ORDER — PRONTOSAN WOUND IRRIGATION OPTIME
TOPICAL | Status: DC | PRN
  Administered 2021-05-15: 350 mL via TOPICAL

## 2021-05-15 MED ORDER — METHOCARBAMOL 500 MG PO TABS
ORAL_TABLET | ORAL | Status: AC
  Filled 2021-05-15: qty 1

## 2021-05-15 MED ORDER — ONDANSETRON HCL 4 MG/2ML IJ SOLN: 4.0000 mg | Freq: Once | INTRAMUSCULAR | Status: DC | PRN

## 2021-05-15 MED ORDER — OXYCODONE HCL 5 MG PO TABS: 5.0000 mg | ORAL_TABLET | Freq: Once | ORAL | Status: DC | PRN

## 2021-05-15 MED ORDER — METHOCARBAMOL 500 MG IVPB - SIMPLE MED
500.0000 mg | Freq: Four times a day (QID) | INTRAVENOUS | Status: DC | PRN
  Filled 2021-05-15: qty 50

## 2021-05-15 MED ORDER — MIDAZOLAM HCL 2 MG/2ML IJ SOLN
1.0000 mg | INTRAMUSCULAR | Status: DC
  Administered 2021-05-15: 2 mg via INTRAVENOUS
  Filled 2021-05-15: qty 2

## 2021-05-15 MED ORDER — LOSARTAN POTASSIUM 50 MG PO TABS
50.0000 mg | ORAL_TABLET | Freq: Every day | ORAL | Status: DC
Start: 2021-05-15 — End: 2021-05-19
  Administered 2021-05-15 – 2021-05-18 (×4): 50 mg via ORAL
  Filled 2021-05-15 (×5): qty 1

## 2021-05-15 MED ORDER — FENTANYL CITRATE (PF) 100 MCG/2ML IJ SOLN
INTRAMUSCULAR | Status: AC
  Filled 2021-05-15: qty 2

## 2021-05-15 MED ORDER — MENTHOL 3 MG MT LOZG: 1.0000 | LOZENGE | OROMUCOSAL | Status: DC | PRN

## 2021-05-15 MED ORDER — ROSUVASTATIN CALCIUM 20 MG PO TABS
20.0000 mg | ORAL_TABLET | Freq: Every day | ORAL | Status: DC
  Administered 2021-05-16 – 2021-05-19 (×4): 20 mg via ORAL
  Filled 2021-05-15 (×4): qty 1

## 2021-05-15 MED ORDER — CEFAZOLIN SODIUM-DEXTROSE 2-4 GM/100ML-% IV SOLN
2.0000 g | Freq: Once | INTRAVENOUS | Status: AC
  Administered 2021-05-15: 2 g via INTRAVENOUS

## 2021-05-15 MED ORDER — LACTATED RINGERS IV SOLN: INTRAVENOUS | Status: DC

## 2021-05-15 MED ORDER — ALBUMIN HUMAN 5 % IV SOLN: INTRAVENOUS | Status: DC | PRN

## 2021-05-15 MED ORDER — TRANEXAMIC ACID-NACL 1000-0.7 MG/100ML-% IV SOLN
1000.0000 mg | INTRAVENOUS | Status: AC
  Administered 2021-05-15: 1000 mg via INTRAVENOUS
  Filled 2021-05-15: qty 100

## 2021-05-15 SURGICAL SUPPLY — 53 items
BAG SPEC THK2 15X12 ZIP CLS (MISCELLANEOUS) ×1
BAG ZIPLOCK 12X15 (MISCELLANEOUS) ×2 IMPLANT
BNDG ELASTIC 6X5.8 VLCR STR LF (GAUZE/BANDAGES/DRESSINGS) ×2 IMPLANT
BONE CEMENT GENTAMICIN (Cement) ×8 IMPLANT
BOWL SMART MIX CTS (DISPOSABLE) ×3 IMPLANT
BRUSH FEMORAL CANAL (MISCELLANEOUS) ×2 IMPLANT
CEMENT BONE GENTAMICIN 40 (Cement) IMPLANT
COVER MAYO STAND STRL (DRAPES) ×1 IMPLANT
COVER SURGICAL LIGHT HANDLE (MISCELLANEOUS) ×2 IMPLANT
CUFF TOURN SGL QUICK 34 (TOURNIQUET CUFF) ×2
CUFF TRNQT CYL 34X4.125X (TOURNIQUET CUFF) ×1 IMPLANT
DERMABOND ADVANCED (GAUZE/BANDAGES/DRESSINGS) ×1
DERMABOND ADVANCED .7 DNX12 (GAUZE/BANDAGES/DRESSINGS) ×1 IMPLANT
DRAPE INCISE IOBAN 66X45 STRL (DRAPES) ×4 IMPLANT
DRAPE U-SHAPE 47X51 STRL (DRAPES) ×2 IMPLANT
DRESSING AQUACEL AG SP 3.5X10 (GAUZE/BANDAGES/DRESSINGS) ×1 IMPLANT
DRSG AQUACEL AG SP 3.5X10 (GAUZE/BANDAGES/DRESSINGS) ×2
DRSG PAD ABDOMINAL 8X10 ST (GAUZE/BANDAGES/DRESSINGS) ×2 IMPLANT
DURAPREP 26ML APPLICATOR (WOUND CARE) ×3 IMPLANT
ELECT REM PT RETURN 15FT ADLT (MISCELLANEOUS) ×2 IMPLANT
GAUZE SPONGE 4X4 12PLY STRL (GAUZE/BANDAGES/DRESSINGS) ×1 IMPLANT
GAUZE XEROFORM 1X8 LF (GAUZE/BANDAGES/DRESSINGS) ×1 IMPLANT
GLOVE SURG ENC MOIS LTX SZ6 (GLOVE) ×4 IMPLANT
GLOVE SURG UNDER LTX SZ6.5 (GLOVE) ×2 IMPLANT
GLOVE SURG UNDER LTX SZ7.5 (GLOVE) ×2 IMPLANT
GLOVE SURG UNDER POLY LF SZ7.5 (GLOVE) ×4 IMPLANT
GOWN STRL REUS W/TWL LRG LVL3 (GOWN DISPOSABLE) ×4 IMPLANT
HANDPIECE INTERPULSE COAX TIP (DISPOSABLE) ×2
IMMOBILIZER KNEE 20 (SOFTGOODS) ×2
IMMOBILIZER KNEE 20 THIGH 36 (SOFTGOODS) IMPLANT
JET LAVAGE IRRISEPT WOUND (IRRIGATION / IRRIGATOR) ×2
KIT TURNOVER KIT A (KITS) IMPLANT
LAVAGE JET IRRISEPT WOUND (IRRIGATION / IRRIGATOR) ×1 IMPLANT
MANIFOLD NEPTUNE II (INSTRUMENTS) ×2 IMPLANT
NS IRRIG 1000ML POUR BTL (IV SOLUTION) ×2 IMPLANT
PACK TOTAL KNEE CUSTOM (KITS) ×2 IMPLANT
PADDING CAST COTTON 6X4 STRL (CAST SUPPLIES) ×2 IMPLANT
PROTECTOR NERVE ULNAR (MISCELLANEOUS) ×2 IMPLANT
SET HNDPC FAN SPRY TIP SCT (DISPOSABLE) ×1 IMPLANT
SET PAD KNEE POSITIONER (MISCELLANEOUS) ×2 IMPLANT
SPACER FEM 44 A/P 67 M/L MED (Miscellaneous) ×1 IMPLANT
SPACER TIB 45 A/P 70 M/L MED (Miscellaneous) ×1 IMPLANT
STAPLER VISISTAT 35W (STAPLE) ×1 IMPLANT
SUT ETHIBOND NAB CT1 #1 30IN (SUTURE) ×1 IMPLANT
SUT MNCRL AB 3-0 PS2 18 (SUTURE) ×2 IMPLANT
SUT STRATAFIX PDS+ 0 24IN (SUTURE) ×2 IMPLANT
SUT VIC AB 0 CT1 36 (SUTURE) ×1 IMPLANT
SUT VIC AB 1 CT1 36 (SUTURE) ×4 IMPLANT
SUT VIC AB 2-0 CT1 27 (SUTURE) ×6
SUT VIC AB 2-0 CT1 TAPERPNT 27 (SUTURE) ×3 IMPLANT
TRAY FOLEY MTR SLVR 16FR STAT (SET/KITS/TRAYS/PACK) ×1 IMPLANT
WATER STERILE IRR 1000ML POUR (IV SOLUTION) ×3 IMPLANT
WRAP KNEE MAXI GEL POST OP (GAUZE/BANDAGES/DRESSINGS) ×2 IMPLANT

## 2021-05-15 NOTE — Progress Notes (Signed)
AssistedDr. Carolyn Witman with left, ultrasound guided, adductor canal block. Side rails up, monitors on throughout procedure. See vital signs in flow sheet. Tolerated Procedure well.  

## 2021-05-15 NOTE — Transfer of Care (Signed)
Immediate Anesthesia Transfer of Care Note  Patient: [name]  Procedure(s) Performed: LEFT TOTAL KNEE IRRIGATION AND DEBRIDEMENT, EXCISION LEFT TOTAL KNEE ARTHROPLASTY WITH ANTIBIOTIC SPACERS (Left: Knee)  Patient Location: PACU  Anesthesia Type:Spinal  Level of Consciousness: awake, alert , oriented and patient cooperative  Airway & Oxygen Therapy: Patient Spontanous Breathing and Patient connected to face mask oxygen  Post-op Assessment: Report given to RN and Post -op Vital signs reviewed and stable  Post vital signs: stable  Last Vitals:  Vitals Value Taken Time  BP 130/77 05/15/21 1854  Temp 36.4 C 05/15/21 1854  Pulse 56 05/15/21 1858  Resp 9 05/15/21 1858  SpO2 100 % 05/15/21 1858  Vitals shown include unvalidated device data.  Last Pain:  Vitals:   05/15/21 1600  TempSrc:   PainSc: 0-No pain      Patients Stated Pain Goal: 1 ([id number])  Complications: No notable events documented.

## 2021-05-15 NOTE — Anesthesia Procedure Notes (Signed)
Spinal  Patient location during procedure: OR End time: 05/15/2021 4:27 PM Staffing Performed: resident/CRNA  Resident/CRNA: Lissa Morales, CRNA Preanesthetic Checklist Completed: patient identified, IV checked, site marked, risks and benefits discussed, surgical consent, monitors and equipment checked, pre-op evaluation and timeout performed Spinal Block Patient position: sitting Prep: DuraPrep Patient monitoring: heart rate, continuous pulse ox and blood pressure Approach: midline Location: L3-4 Needle Needle type: Pencan  Needle gauge: 24 G Needle length: 9 cm Assessment Sensory level: T4 Events: CSF return Additional Notes Monitors applied, IV functioning, Sterile prep/drape/gloves used, skin prepped and anesthetized with lidocaine, free flow of clear CSF prior to injecting local anesthetic on     attempts, negative heme, negative paraesthesia, needle carefully withdrawn, patient tolerated procedure well.

## 2021-05-15 NOTE — Anesthesia Procedure Notes (Signed)
Anesthesia Regional Block: Adductor canal block   Pre-Anesthetic Checklist: , timeout performed,  Correct Patient, Correct Site, Correct Laterality,  Correct Procedure, Correct Position, site marked,  Risks and benefits discussed,  Surgical consent,  Pre-op evaluation,  At surgeon's request and post-op pain management  Laterality: Left  Prep: chloraprep       Needles:  Injection technique: Single-shot  Needle Type: Echogenic Stimulator Needle     Needle Length: 10cm  Needle Gauge: 20     Additional Needles:   Procedures:,,,, ultrasound used (permanent image in chart),,    Narrative:  Start time: 05/15/2021 3:51 PM End time: 05/15/2021 3:55 PM Injection made incrementally with aspirations every 5 mL.  Performed by: Personally  Anesthesiologist: Lidia Collum, MD  Additional Notes: Standard monitors applied. Skin prepped. Good needle visualization with ultrasound. Injection made in 5cc increments with no resistance to injection. Patient tolerated the procedure well.

## 2021-05-15 NOTE — Brief Op Note (Signed)
05/15/2021  6:34 PM  PATIENT:  [name]  66 y.o. male  PRE-OPERATIVE DIAGNOSIS:  left knee arthroplasty infection  POST-OPERATIVE DIAGNOSIS:  left knee arthroplasty infection  PROCEDURE:  Procedure(s): LEFT TOTAL KNEE IRRIGATION AND DEBRIDEMENT, EXCISION LEFT TOTAL KNEE ARTHROPLASTY WITH ANTIBIOTIC SPACERS (Left)  SURGEON:  Surgeon(s) and Role:    Mcarthur Rossetti, MD - Primary  PHYSICIAN ASSISTANT: Benita Stabile, PA-C  ANESTHESIA:   regional and spinal  EBL:  100 mL   COUNTS:  YES  TOURNIQUET:   Total Tourniquet Time Documented: Thigh (Left) - 80 minutes Total: Thigh (Left) - 80 minutes   DICTATION: .Other Dictation: Dictation Number [id number]  PLAN OF CARE: Admit for overnight observation  PATIENT DISPOSITION:  PACU - hemodynamically stable.   Delay start of Pharmacological VTE agent (>24hrs) due to surgical blood loss or risk of bleeding: no

## 2021-05-15 NOTE — Interval H&P Note (Signed)
History and Physical Interval Note: The patient understands that he is here today for an excision arthroplasty of a chronically infected left total knee replacement.  The goals of surgery were explained in detail as well as the risk and benefits of surgery.  He understands that we need to remove all components and place a temporary antibiotic spacer and perform a synovectomy and attempts of eradicating the infection.  This has been previously a sensitive infection.  There is been no other acute change in his medical status.  Please see H&P.  Informed consent is obtained.  05/15/2021 1:17 PM  [name]  has presented today for surgery, with the diagnosis of left knee arthroplasty infection.  The various methods of treatment have been discussed with the patient and family. After consideration of risks, benefits and other options for treatment, the patient has consented to  Procedure(s): LEFT TOTAL KNEE IRRIGATION AND DEBRIDEMENT, EXCISION LEFT TOTAL KNEE ARTHROPLASTY WITH ANTIBIOTIC SPACERS (Left) as a surgical intervention.  The patient's history has been reviewed, patient examined, no change in status, stable for surgery.  I have reviewed the patient's chart and labs.  Questions were answered to the patient's satisfaction.     Mcarthur Rossetti

## 2021-05-15 NOTE — Plan of Care (Signed)
°  Problem: Education: Goal: Knowledge of General Education information will improve Description: Including pain rating scale, medication(s)/side effects and non-pharmacologic comfort measures 05/15/2021 2146 by Jerrol Banana, RN Outcome: Progressing 05/15/2021 2135 by Jerrol Banana, RN Outcome: Progressing

## 2021-05-15 NOTE — Anesthesia Procedure Notes (Signed)
Procedure Name: MAC Date/Time: 05/15/2021 4:21 PM Performed by: Lissa Morales, CRNA Pre-anesthesia Checklist: Patient identified, Emergency Drugs available, Suction available and Patient being monitored Patient Re-evaluated:Patient Re-evaluated prior to induction Oxygen Delivery Method: Simple face mask Placement Confirmation: positive ETCO2

## 2021-05-15 NOTE — Op Note (Addendum)
NAMEQUINTO, TIPPY MEDICAL RECORD NO: [id number] ACCOUNT NO: [id number] DATE OF BIRTH: [birth year] FACILITY: [facility] LOCATION: [id number] PHYSICIAN: Lind Guest. 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  PROCEDURES: 1.  Excision arthroplasty of left total knee, removing all components. 2.  Irrigation and debridement of left knee joint including sharp excisional debridement of necrotic fascia, soft tissue and limited bone. 3.  Placement of temporary antibiotic articulating spacer.  SURGEON:  Lind Guest. Ninfa Linden, MD  ASSISTANT:  Benita Stabile, PA-C  ANESTHESIA:  1.  Left lower extremity adductor canal block. 2.  Spinal.  TOURNIQUET TIME:  Under 2 hours.  BLOOD LOSS:  Less than 200 mL.  COMPLICATIONS:  None.  ANTIBIOTICS:  2 g IV Ancef after cultures obtained.  INDICATIONS:  The patient is a 66 year old gentleman who underwent a primary total knee arthroplasty in 02/2019.  In 07/2020, he developed an infection of that left knee.  Between his initial surgery, over a year and a half earlier until the time of his  infection, he developed prostate cancer and was on significant immunosuppressive types of medications.  Also, his diabetes became under poor control and his hemoglobin A1c elevated significantly.  I believe that combination contributed to his infection.   He ended up growing out sensitive Staph, which was methicillin-sensitive Staph aureus from the knee.  In 07/2020 , he underwent a synovectomy of his left total knee arthroplasty with exchange of his poly liner.  He was then on 6 weeks of IV antibiotics  and continued for a time on oral antibiotics per the infectious disease clinic.  Recently, he presented to the office with knee swelling after a long period of time of having the knee doing well.  I  aspirated fluid from the knee that had a high white  blood cell count, but it showed no organisms in the office.  However, his sed rate and CRP were elevated enough and with enough white cells in the knee, I thought it was prudent to proceed with an excision arthroplasty to hopefully sterilize the knee and  eradicate the infection.  He understands the rationale behind this and why we were proceeding.  Of note, his diabetes is under much better control now.  DESCRIPTION OF PROCEDURE:  After informed consent was obtained, appropriate left knee was marked and adductor canal block was obtained in the left lower extremity in the holding room.  He was then brought to the operating room and sat up on the operating  table where spinal anesthesia was obtained. A nonsterile tourniquet was placed around his upper left thigh and his left thigh, knee, leg, ankle, and foot were prepped and draped with DuraPrep and sterile drapes including a sterile stockinette.  A  timeout was called and he was identified correct patient, correct left knee.  We then used Esmarch to wrap out the leg and tourniquet was inflated to 300 mm of pressure.  We then made a direct midline incision over his previous incision, dissected down  to the knee joint. We carried out a medial parapatellar arthrotomy and found a very large joint effusion that appeared purulent.  We immediately sent off cultures and Gram stain.  We then had anesthesia give him 2 grams of IV Ancef.  Once we got inside  the knee joint, we irrigated the knee with normal saline solution.  We  then were able to remove the poly liner and removed the femoral and tibial component and the patellar component.  They were all press-fit components.  Unfortunately, did not remove  much bone, removing these, but did show bone ingrowth and there was no evidence of loosening.  We then used a #10 blade to perform a sharp synovectomy, removing necrotic tissue from around the knee itself.  Once  we felt like we performed a thorough  debridement, excising sharply necrotic tissue, we then irrigated 3 liters normal saline solution using pulsatile lavage to the knee.  Once we dried this real well, we then irrigated Prontosan solution in the knee and let this sit in the knee for a  while.  We then placed a Surespace spacer in the knee using a femoral mold size medium, which was 44 mm in the AP dimension and 67 in length.  We mixed our cement with gentamicin and vancomycin and then placed our femoral mold.  We then did a medium  tibial mold which was 45 in the AP direction and 70 mL of cement and we made an articulating spacer and cemented this to the tibia after measuring our extension gap.  We allowed the cement mold on the femur to harden first before, we then cemented our  articulating spacer.  We held the knee fully extended and it did keep the knee extended and stable.  We then irrigated more solution to the knee of the Prontosan solution in the knee and let that sit there for a while again.  We then let the tourniquet  down.  Hemostasis was obtained with electrocautery. We closed the arthrotomy with interrupted #1 Vicryl suture followed by 0 Vicryl to close the deep tissue and 2-0 Vicryl to close the subcutaneous tissue.  The skin was closed with staples.  A  well-padded sterile dressing was applied.  He was taken to recovery room in stable condition with all final counts being correct.  No complications noted.  Postoperatively, he will be admitted to the hospital and started on IV antibiotics.  He will need  to have a PICC line placed and an infectious disease consult as well.  Of note, Benita Stabile, PA-C, did assist during the entire case and his assistance was crucial for facilitating all aspects of this case.  The case was incredibly difficult and his assistance was medically necessary for soft tissue management and retraction of tissues as well as layered closure of the wound.   SHW D:  05/15/2021 6:28:48 pm T: 05/15/2021 11:24:00 pm  JOB: [id number]/ [id number]

## 2021-05-15 NOTE — Anesthesia Preprocedure Evaluation (Signed)
Anesthesia Evaluation  Patient identified by MRN, date of birth, ID band Patient awake    Reviewed: Allergy & Precautions, H&P , NPO status , Patient's Chart, lab work & pertinent test results  Airway Mallampati: II  TM Distance: >3 FB Neck ROM: Full    Dental   Pulmonary neg pulmonary ROS,    Pulmonary exam normal        Cardiovascular hypertension, Pt. on medications  Rhythm:Regular Rate:Normal     Neuro/Psych Depression negative neurological ROS     GI/Hepatic Neg liver ROS, GERD  ,  Endo/Other  diabetes, Type 2, Insulin Dependent, Oral Hypoglycemic Agents  Renal/GU negative Renal ROS  negative genitourinary   Musculoskeletal  (+) Arthritis , Osteoarthritis,    Abdominal   Peds  Hematology negative hematology ROS (+)   Anesthesia Other Findings Infected knee prosthesis  Reproductive/Obstetrics negative OB ROS                            Anesthesia Physical  Anesthesia Plan  ASA: 3  Anesthesia Plan: Spinal   Post-op Pain Management: Regional block*, Tylenol PO (pre-op)* and Toradol IV (intra-op)*   Induction:   PONV Risk Score and Plan: 2 and Propofol infusion, Treatment may vary due to age or medical condition, Ondansetron and TIVA  Airway Management Planned: Nasal Cannula and Simple Face Mask  Additional Equipment: None  Intra-op Plan:   Post-operative Plan:   Informed Consent: I have reviewed the patients History and Physical, chart, labs and discussed the procedure including the risks, benefits and alternatives for the proposed anesthesia with the patient or authorized representative who has indicated his/her understanding and acceptance.       Plan Discussed with:   Anesthesia Plan Comments:        Anesthesia Quick Evaluation

## 2021-05-16 LAB — CBC
HCT: 35 % — ABNORMAL LOW (ref 39.0–52.0)
Hemoglobin: 11.9 g/dL — ABNORMAL LOW (ref 13.0–17.0)
MCH: 29.4 pg (ref 26.0–34.0)
MCHC: 34 g/dL (ref 30.0–36.0)
MCV: 86.4 fL (ref 80.0–100.0)
Platelets: 185 10*3/uL (ref 150–400)
RBC: 4.05 MIL/uL — ABNORMAL LOW (ref 4.22–5.81)
RDW: 12.6 % (ref 11.5–15.5)
WBC: 7.7 10*3/uL (ref 4.0–10.5)
nRBC: 0 % (ref 0.0–0.2)

## 2021-05-16 LAB — GLUCOSE, CAPILLARY
Glucose-Capillary: 141 mg/dL — ABNORMAL HIGH (ref 70–99)
Glucose-Capillary: 123 mg/dL — ABNORMAL HIGH (ref 70–99)
Glucose-Capillary: 118 mg/dL — ABNORMAL HIGH (ref 70–99)
Glucose-Capillary: 91 mg/dL (ref 70–99)

## 2021-05-16 LAB — BASIC METABOLIC PANEL
Anion gap: 6 (ref 5–15)
BUN: 15 mg/dL (ref 8–23)
CO2: 27 mmol/L (ref 22–32)
Calcium: 8.2 mg/dL — ABNORMAL LOW (ref 8.9–10.3)
Chloride: 100 mmol/L (ref 98–111)
Creatinine, Ser: 0.93 mg/dL (ref 0.61–1.24)
GFR, Estimated: >60 mL/min (ref >60)
Glucose, Bld: 159 mg/dL — ABNORMAL HIGH (ref 70–99)
Potassium: 3.8 mmol/L (ref 3.5–5.1)
Sodium: 133 mmol/L — ABNORMAL LOW (ref 135–145)

## 2021-05-16 LAB — C-REACTIVE PROTEIN: CRP: 6.2 mg/dL — ABNORMAL HIGH (ref <1.0)

## 2021-05-16 LAB — SEDIMENTATION RATE: Sed Rate: 16 mm/hr (ref 0–16)

## 2021-05-16 LAB — HEMOGLOBIN A1C
Hgb A1c MFr Bld: 6.8 % — ABNORMAL HIGH (ref 4.8–5.6)
Mean Plasma Glucose: 148.46 mg/dL

## 2021-05-16 MED ORDER — OXYCODONE HCL ER 15 MG PO T12A
15.0000 mg | EXTENDED_RELEASE_TABLET | Freq: Two times a day (BID) | ORAL | Status: DC
  Administered 2021-05-16 – 2021-05-19 (×8): 15 mg via ORAL
  Filled 2021-05-16 (×8): qty 1

## 2021-05-16 MED ORDER — INSULIN ASPART 100 UNIT/ML IJ SOLN: 0.0000 [IU] | Freq: Every day | INTRAMUSCULAR | Status: DC

## 2021-05-16 MED ORDER — HYDROMORPHONE HCL 1 MG/ML IJ SOLN
0.5000 mg | INTRAMUSCULAR | Status: DC | PRN
  Administered 2021-05-16 – 2021-05-18 (×6): 1 mg via INTRAVENOUS
  Filled 2021-05-16 (×7): qty 1

## 2021-05-16 MED ORDER — INSULIN ASPART 100 UNIT/ML IJ SOLN
0.0000 [IU] | Freq: Three times a day (TID) | INTRAMUSCULAR | Status: DC
  Administered 2021-05-16 – 2021-05-18 (×2): 2 [IU] via SUBCUTANEOUS

## 2021-05-16 MED ORDER — KETOROLAC TROMETHAMINE 15 MG/ML IJ SOLN
7.5000 mg | Freq: Four times a day (QID) | INTRAMUSCULAR | Status: AC
  Administered 2021-05-16 – 2021-05-17 (×4): 7.5 mg via INTRAVENOUS
  Filled 2021-05-16 (×4): qty 1

## 2021-05-16 NOTE — Progress Notes (Signed)
Patient ID: [name], male   DOB: June 24, 1955, 66 y.o.   MRN: [id number] Nursing call patient on oral duracef and IV ancef, wishes to stop duracef and I agree. Duracef discontinued.

## 2021-05-16 NOTE — Progress Notes (Signed)
ID PROGRESS NOTE  Will see formally tomorrow  66yo M with hx of left MSSA pji who failed 1 staged revision in 5/22, he completed 6 wk of cefazolin then transitioned to cefadroxil -has had increasing with elevated WBC. Dr Ninfa Linden explanted his hardware and placed abtx spacer. Patient continued on cefazolin 2gm IV q8hr. Awaiting OR cultures to redo course of 6 wk IV abtx  [name] for Infectious Diseases [phone]

## 2021-05-16 NOTE — Progress Notes (Signed)
PT Cancellation Note  Patient Details Name: [name] MRN: [id number] DOB: 1955/08/02   Cancelled Treatment:    Reason Eval/Treat Not Completed:  Checked back with pt-he would like to skip therapy today and start tomorrow. Will check back in the morning.    [facility]  Office: [phone] Pager: [phone]

## 2021-05-16 NOTE — Progress Notes (Signed)
Patient ID: [name], male   DOB: February 27, 1956, 66 y.o.   MRN: [id number] The patient did have a consider amount of pain last evening to be expected status post an excision arthroplasty of his left total knee arthroplasty secondary to a chronic infection.  His original infection showed sensitive Staph aureus.  We did obtain cultures yesterday which so far negative.  However, clinically in the operating room we felt there was still infection present and biofilm.  We performed a significant synovectomy and removed all components and placed an antibiotic spacer in the knee joint.  He is on cefazolin now IV.  This morning he is certainly uncomfortable.  We will allow weightbearing as tolerated but I am fine with him deferring physical therapy today based on how painful things are for him.  I have reached out to the infectious disease service.  He has been followed by Dr. Tommy Medal before.  Dr. Graylon Good plans to see him today.  We will also order a PICC line.  Anticipate discharge on Monday or Tuesday once home health nursing and PICC line care, etc. can be set up.  I will try ordering some Toradol to help with his pain.

## 2021-05-16 NOTE — Progress Notes (Signed)
PT Cancellation Note  Patient Details Name: [name] MRN: [id number] DOB: Aug 28, 1955   Cancelled Treatment:    Reason Eval/Treat Not Completed:  Attempted PT eval-spoke with pt and wife. Agreeable to me checking back later today to see how he feels about getting up.    [facility]  Office: [phone] Pager: [phone]

## 2021-05-16 NOTE — Plan of Care (Signed)

## 2021-05-16 NOTE — Plan of Care (Signed)
°  Problem: Education: Goal: Knowledge of General Education information will improve Description: Including pain rating scale, medication(s)/side effects and non-pharmacologic comfort measures Outcome: Progressing   Problem: Coping: Goal: Level of anxiety will decrease Outcome: Progressing   Problem: Pain Managment: Goal: General experience of comfort will improve Outcome: Progressing   Problem: Safety: Goal: Ability to remain free from injury will improve Outcome: Progressing   Problem: Education: Goal: Knowledge of the prescribed therapeutic regimen will improve Outcome: Progressing   Problem: Clinical Measurements: Goal: Postoperative complications will be avoided or minimized Outcome: Progressing

## 2021-05-17 ENCOUNTER — Inpatient Hospital Stay: Payer: Self-pay

## 2021-05-17 DIAGNOSIS — T8454XD Infection and inflammatory reaction due to internal left knee prosthesis, subsequent encounter: Secondary | ICD-10-CM | POA: Diagnosis not present

## 2021-05-17 LAB — GLUCOSE, CAPILLARY
Glucose-Capillary: 59 mg/dL — ABNORMAL LOW (ref 70–99)
Glucose-Capillary: 60 mg/dL — ABNORMAL LOW (ref 70–99)
Glucose-Capillary: 72 mg/dL (ref 70–99)
Glucose-Capillary: 109 mg/dL — ABNORMAL HIGH (ref 70–99)
Glucose-Capillary: 75 mg/dL (ref 70–99)
Glucose-Capillary: 178 mg/dL — ABNORMAL HIGH (ref 70–99)

## 2021-05-17 MED ORDER — INSULIN GLARGINE-YFGN 100 UNIT/ML ~~LOC~~ SOLN
60.0000 [IU] | Freq: Every day | SUBCUTANEOUS | Status: DC
  Administered 2021-05-18 – 2021-05-19 (×2): 60 [IU] via SUBCUTANEOUS
  Filled 2021-05-17 (×3): qty 0.6

## 2021-05-17 NOTE — Plan of Care (Signed)
  Problem: Education: Goal: Knowledge of General Education information will improve Description: Including pain rating scale, medication(s)/side effects and non-pharmacologic comfort measures Outcome: Progressing   Problem: Health Behavior/Discharge Planning: Goal: Ability to manage health-related needs will improve Outcome: Progressing   Problem: Clinical Measurements: Goal: Ability to maintain clinical measurements within normal limits will improve Outcome: Progressing   Problem: Activity: Goal: Risk for activity intolerance will decrease Outcome: Progressing   Problem: Nutrition: Goal: Adequate nutrition will be maintained Outcome: Progressing   Problem: Elimination: Goal: Will not experience complications related to bowel motility Outcome: Progressing   Problem: Pain Managment: Goal: General experience of comfort will improve Outcome: Progressing   

## 2021-05-17 NOTE — Anesthesia Postprocedure Evaluation (Signed)
Anesthesia Post Note  Patient: Edder Bellanca  Procedure(s) Performed: LEFT TOTAL KNEE IRRIGATION AND DEBRIDEMENT, EXCISION LEFT TOTAL KNEE ARTHROPLASTY WITH ANTIBIOTIC SPACERS (Left: Knee)     Patient location during evaluation: PACU Anesthesia Type: Spinal Level of consciousness: oriented and awake and alert Pain management: pain level controlled Vital Signs Assessment: post-procedure vital signs reviewed and stable Respiratory status: spontaneous breathing, respiratory function stable and nonlabored ventilation Cardiovascular status: blood pressure returned to baseline and stable Postop Assessment: no headache, no backache, no apparent nausea or vomiting and spinal receding Anesthetic complications: no   No notable events documented.  Last Vitals:  Vitals:   05/16/21 2006 05/17/21 0513  BP: 115/60 (!) 118/57  Pulse: 65 68  Resp: 18 18  Temp: 36.8 C 36.7 C  SpO2: 92% 97%    Last Pain:  Vitals:   05/17/21 0749  TempSrc:   PainSc: 1                  Lidia Collum

## 2021-05-17 NOTE — Consult Note (Addendum)
[facility]  Total days of antibiotics 3/day 2 cefazolin               Reason for Consult:recurrent chronic pji of left knee   Referring Physician: blackman  Principal Problem:   Infection of total left knee replacement ([place])    HPI: [name] is a 66 y.o. male with hx of HTN, HLD, prostat ca, DM, oa of left knee had TKA on 2/20 c/b MSSA PJI in 5/22 s/p DAIR- synovectomy, polyliner exchange treated with cefazolin with rifampin followed by cefodroxil however in the recent months started to have worsening swelling of knee, pain and steady increase of inflammatory marker. Decision was made to have him re-admitted for 2 staged revision with resection arthroplasty for removal of all components/ and implant of articulating abtx sapcer. Repeat cultures sent. Continued on cefazolin 2gm IV Q 8hr. ID asked to weigh back in. The thought is that there may still be MSSA biofilm that led to recent flare of symptoms recurrence of infection. He tolerated doing picc line without difficulty  Past Medical History:  Diagnosis Date   Arthritis    Depression    Diabetes mellitus without complication ([place]) 4098   Diabetes Type II   GERD (gastroesophageal reflux disease)    diet controlled   HTN (hypertension) 10/27/2018   Hyperlipidemia    Internal hemorrhoids 2014   noted on colonoscopy   MSSA (methicillin susceptible Staphylococcus aureus) infection 09/03/2020   Osteoarthritis of both knees    Prostate cancer ([place])    prostate   Sigmoid diverticulosis 2014   Mild, noted on colonoscopy   Skin lesion 01/01/2021   Type 2 diabetes mellitus with other specified complication ([place]) 11/91/4782   Vaccine counseling 05/04/2021    Allergies: No Known Allergies  MEDICATIONS:  aspirin  81 mg Oral BID   docusate sodium  100 mg Oral BID   escitalopram  10 mg Oral Daily   losartan  50 mg Oral Daily   And   hydrochlorothiazide  12.5 mg Oral Daily   insulin aspart  0-15 Units  Subcutaneous TID WC   insulin aspart  0-5 Units Subcutaneous QHS   [START ON 05/18/2021] insulin glargine-yfgn  60 Units Subcutaneous Daily   oxyCODONE  15 mg Oral Q12H   pantoprazole  40 mg Oral Daily   rosuvastatin  20 mg Oral Daily    Social History   Tobacco Use   Smoking status: Never   Smokeless tobacco: Never  Vaping Use   Vaping Use: Never used  Substance Use Topics   Alcohol use: No   Drug use: No    Family History  Problem Relation Age of Onset   Cancer Mother        small cell lung     Review of Systems  Constitutional: Negative for fever, chills, diaphoresis, activity change, appetite change, fatigue and unexpected weight change.  HENT: Negative for congestion, sore throat, rhinorrhea, sneezing, trouble swallowing and sinus pressure.  Eyes: Negative for photophobia and visual disturbance.  Respiratory: Negative for cough, chest tightness, shortness of breath, wheezing and stridor.  Cardiovascular: Negative for chest pain, palpitations and leg swelling.  Gastrointestinal: Negative for nausea, vomiting, abdominal pain, diarrhea, constipation, blood in stool, abdominal distention and anal bleeding.  Genitourinary: Negative for dysuria, hematuria, flank pain and difficulty urinating.  Musculoskeletal: +left knee pain and swelling. Negative for myalgias, back pain, joint swelling, arthralgias and gait problem.  Skin: Negative for color change, pallor,  rash and wound.  Neurological: Negative for dizziness, tremors, weakness and light-headedness.  Hematological: Negative for adenopathy. Does not bruise/bleed easily.  Psychiatric/Behavioral: Negative for behavioral problems, confusion, sleep disturbance, dysphoric mood, decreased concentration and agitation.    OBJECTIVE: Temp:  [98.1 F (36.7 C)-98.6 F (37 C)] 98.6 F (37 C) (02/26 1335) Pulse Rate:  [65-73] 68 (02/26 1335) Resp:  [18] 18 (02/26 1335) BP: (102-118)/(56-70) 102/56 (02/26 1335) SpO2:  [92 %-100 %]  97 % (02/26 1335) Physical Exam  Constitutional: He is oriented to person, place, and time. He appears well-developed and well-nourished. No distress.  HENT:  Mouth/Throat: Oropharynx is clear and moist. No oropharyngeal exudate.  Cardiovascular: Normal rate, regular rhythm and normal heart sounds. Exam reveals no gallop and no friction rub.  No murmur heard.  Pulmonary/Chest: Effort normal and breath sounds normal. No respiratory distress. He has no wheezes.  Abdominal: Soft. Bowel sounds are normal. He exhibits no distension. There is no tenderness.  ZWC:HENI leg wrapped from surgery Neurological: He is alert and oriented to person, place, and time.  Skin: Skin is warm and dry. No rash noted. No erythema.  Psychiatric: He has a normal mood and affect. His behavior is normal.    LABS: Results for orders placed or performed during the hospital encounter of 05/15/21 (from the past 48 hour(s))  Glucose, capillary     Status: Abnormal   Collection Time: 05/15/21  4:58 PM  Result Value Ref Range   Glucose-Capillary 151 (H) 70 - 99 mg/dL    Comment: Glucose reference range applies only to samples taken after fasting for at least 8 hours.  Aerobic/Anaerobic Culture w Gram Stain (surgical/deep wound)     Status: None (Preliminary result)   Collection Time: 05/15/21  5:17 PM   Specimen: Synovial, Left Knee; Body Fluid  Result Value Ref Range   Specimen Description      SYNOVIAL Performed at [facility] [address], [place], [place] [address]    Special Requests      LEFT KNEE Performed at [facility] [address], [place], [place] [address]    Gram Stain      ABUNDANT WBC PRESENT, PREDOMINANTLY PMN NO ORGANISMS SEEN    Culture      NO GROWTH 2 DAYS NO ANAEROBES ISOLATED; CULTURE IN PROGRESS FOR 5 DAYS Performed at [facility] [address] [place], [place] [address]    Report Status PENDING   Glucose, capillary     Status:  None   Collection Time: 05/15/21  6:58 PM  Result Value Ref Range   Glucose-Capillary 75 70 - 99 mg/dL    Comment: Glucose reference range applies only to samples taken after fasting for at least 8 hours.  Glucose, capillary     Status: Abnormal   Collection Time: 05/15/21  7:44 PM  Result Value Ref Range   Glucose-Capillary 53 (L) 70 - 99 mg/dL    Comment: Glucose reference range applies only to samples taken after fasting for at least 8 hours.  Glucose, capillary     Status: Abnormal   Collection Time: 05/15/21  8:13 PM  Result Value Ref Range   Glucose-Capillary 66 (L) 70 - 99 mg/dL    Comment: Glucose reference range applies only to samples taken after fasting for at least 8 hours.  Glucose, capillary     Status: Abnormal   Collection Time: 05/15/21 10:17 PM  Result Value Ref Range   Glucose-Capillary 112 (H)  70 - 99 mg/dL    Comment: Glucose reference range applies only to samples taken after fasting for at least 8 hours.  CBC     Status: Abnormal   Collection Time: 05/16/21  3:18 AM  Result Value Ref Range   WBC 7.7 4.0 - 10.5 K/uL   RBC 4.05 (L) 4.22 - 5.81 MIL/uL   Hemoglobin 11.9 (L) 13.0 - 17.0 g/dL   HCT 35.0 (L) 39.0 - 52.0 %   MCV 86.4 80.0 - 100.0 fL   MCH 29.4 26.0 - 34.0 pg   MCHC 34.0 30.0 - 36.0 g/dL   RDW 12.6 11.5 - 15.5 %   Platelets 185 150 - 400 K/uL   nRBC 0.0 0.0 - 0.2 %    Comment: Performed at [facility], [place] [address], [place], [place] [address]  Basic metabolic panel     Status: Abnormal   Collection Time: 05/16/21  3:18 AM  Result Value Ref Range   Sodium 133 (L) 135 - 145 mmol/L   Potassium 3.8 3.5 - 5.1 mmol/L   Chloride 100 98 - 111 mmol/L   CO2 27 22 - 32 mmol/L   Glucose, Bld 159 (H) 70 - 99 mg/dL    Comment: Glucose reference range applies only to samples taken after fasting for at least 8 hours.   BUN 15 8 - 23 mg/dL   Creatinine, Ser 0.93 0.61 - 1.24 mg/dL   Calcium 8.2 (L) 8.9 - 10.3 mg/dL   GFR, Estimated >60  >60 mL/min    Comment: (NOTE) Calculated using the CKD-EPI Creatinine Equation (2021)    Anion gap 6 5 - 15    Comment: Performed at [facility], [place] [address] [place], [place] [address]  Sedimentation rate     Status: None   Collection Time: 05/16/21  8:01 AM  Result Value Ref Range   Sed Rate 16 0 - 16 mm/hr    Comment: Performed at [facility], [place] [address], [place], [place] [address]  Hemoglobin A1c     Status: Abnormal   Collection Time: 05/16/21  8:01 AM  Result Value Ref Range   Hgb A1c MFr Bld 6.8 (H) 4.8 - 5.6 %    Comment: (NOTE) Pre diabetes:          5.7%-6.4%  Diabetes:              >6.4%  Glycemic control for   <7.0% adults with diabetes    Mean Plasma Glucose 148.46 mg/dL    Comment: Performed at [facility] [address] [place], [place] [address]  Glucose, capillary     Status: Abnormal   Collection Time: 05/16/21  8:04 AM  Result Value Ref Range   Glucose-Capillary 141 (H) 70 - 99 mg/dL    Comment: Glucose reference range applies only to samples taken after fasting for at least 8 hours.  C-reactive protein     Status: Abnormal   Collection Time: 05/16/21  8:17 AM  Result Value Ref Range   CRP 6.2 (H) <1.0 mg/dL    Comment: Performed at [facility] [address], [place], [place] [address]  Glucose, capillary     Status: Abnormal   Collection Time: 05/16/21 12:08 PM  Result Value Ref Range   Glucose-Capillary 123 (H) 70 - 99 mg/dL    Comment: Glucose reference range applies only to samples taken after fasting for at least 8 hours.  Glucose, capillary  Status: Abnormal   Collection Time: 05/16/21  3:50 PM  Result Value Ref Range   Glucose-Capillary 118 (H) 70 - 99 mg/dL    Comment: Glucose reference range applies only to samples taken after fasting for at least 8 hours.  Glucose, capillary     Status: None   Collection Time: 05/16/21  8:07 PM  Result Value Ref Range   Glucose-Capillary 91  70 - 99 mg/dL    Comment: Glucose reference range applies only to samples taken after fasting for at least 8 hours.  Glucose, capillary     Status: Abnormal   Collection Time: 05/17/21  7:41 AM  Result Value Ref Range   Glucose-Capillary 59 (L) 70 - 99 mg/dL    Comment: Glucose reference range applies only to samples taken after fasting for at least 8 hours.  Glucose, capillary     Status: Abnormal   Collection Time: 05/17/21  8:33 AM  Result Value Ref Range   Glucose-Capillary 60 (L) 70 - 99 mg/dL    Comment: Glucose reference range applies only to samples taken after fasting for at least 8 hours.  Glucose, capillary     Status: None   Collection Time: 05/17/21  8:35 AM  Result Value Ref Range   Glucose-Capillary 72 70 - 99 mg/dL    Comment: Glucose reference range applies only to samples taken after fasting for at least 8 hours.  Glucose, capillary     Status: Abnormal   Collection Time: 05/17/21 11:41 AM  Result Value Ref Range   Glucose-Capillary 109 (H) 70 - 99 mg/dL    Comment: Glucose reference range applies only to samples taken after fasting for at least 8 hours.   Lab Results  Component Value Date   ESRSEDRATE 16 05/16/2021   Lab Results  Component Value Date   CRP 6.2 (H) 05/16/2021    MICRO: reviewed IMAGING: [place] EKG SITE RITE  Result Date: 05/17/2021 If Site Rite image not attached, placement could not be confirmed due to current cardiac rhythm.   Assessment/Plan:  66yo M with hx of MSSA chronic left knee PJI s/p excisional arthroplasty with removal of all HW , IX D and new implant of articulating antibiotic spacer. Currently on cefazolin 2gm IV Q 8hr. Cultures pending  - plan for picc line tomorrow by vascular team - will continue to follow up on OR cultures but suspect this is still MSSA infection - will place OPAT orders for cefazolin x 6 wk through April 8th - Dr Leanora Cover to see tomorrow to finalize plan - will have patient follow up with Dr Tommy Medal in  4-6 wk. ------------------ Diagnosis: Pji of left knee  Culture Result: hx of mssa/ current cx are pending  No Known Allergies  OPAT Orders Discharge antibiotics to be given via PICC line Discharge antibiotics:cefazolin. Per pharmacy protocol cefazolin 2gm iv q 8hr  Aim for Vancomycin trough 15-20 or AUC 400-550 (unless otherwise indicated) Duration: 6 wk End Date: April 8th  [place] Per Protocol:  Home health RN for IV administration and teaching; PICC line care and labs.    Labs weekly while on IV antibiotics: _x_ CBC with differential _x_ BMP __ CMP _x_ CRP _x_ ESR __ Vancomycin trough __ CK  __x Please pull PIC at completion of IV antibiotics __ Please leave PIC in place until doctor has seen patient or been notified  Fax weekly labs to [phone]  Clinic Follow Up Appt: In 4 wk with dr Lucianne Lei dam  @  [facility] [phone]

## 2021-05-17 NOTE — Evaluation (Signed)
Physical Therapy Evaluation Patient Details Name: Bartlett Enke MRN: [id number] DOB: 1955-12-12 Today's Date: 05/17/2021  History of Present Illness  66 yo male admitted with L total knee prosthetic infection. S/P I&D, placement of antibiotic articulating spacer 2/24.  Clinical Impression  On eval, pt was Supv for mobility. He walked ~150 feet with a RW. Moderate pain level-rated 5/10. No LOB with RW use. Mild lightheadedness but he tolerated distance well. Will plan to follow during hospital stay.        Recommendations for follow up therapy are one component of a multi-disciplinary discharge planning process, led by the attending physician.  Recommendations may be updated based on patient status, additional functional criteria and insurance authorization.  Follow Up Recommendations No PT follow up    Assistance Recommended at Discharge PRN  Patient can return home with the following  Assistance with cooking/housework;Assist for transportation;Help with stairs or ramp for entrance;A little help with bathing/dressing/bathroom    Equipment Recommendations Rolling walker (2 wheels) (if pt decides he wants one. he stated he has access to crutches at home.)  Recommendations for Other Services       Functional Status Assessment Patient has had a recent decline in their functional status and demonstrates the ability to make significant improvements in function in a reasonable and predictable amount of time.     Precautions / Restrictions Precautions Precautions: Knee Required Braces or Orthoses: Knee Immobilizer - Left Knee Immobilizer - Left: On when out of bed or walking Restrictions Weight Bearing Restrictions: No LLE Weight Bearing: Weight bearing as tolerated Other Position/Activity Restrictions: "limited" knee flexion      Mobility  Bed Mobility Overal bed mobility: Needs Assistance Bed Mobility: Supine to Sit, Sit to Supine     Supine to sit: Supervision, HOB  elevated Sit to supine: Supervision, HOB elevated   General bed mobility comments: Pt hooks operative leg with good leg. Close supervision but no assistance needed with KI in place.    Transfers Overall transfer level: Needs assistance Equipment used: Rolling walker (2 wheels) Transfers: Sit to/from Stand Sit to Stand: Supervision           General transfer comment: Cues for safety, technique, hand/LE placement.    Ambulation/Gait Ambulation/Gait assistance: Supervision Gait Distance (Feet): 150 Feet Assistive device: Rolling walker (2 wheels) Gait Pattern/deviations: Step-through pattern, Decreased stride length       General Gait Details: Cues for safety. Mild lightheadedness. No LOB with RW use.  Stairs            Wheelchair Mobility    Modified Rankin (Stroke Patients Only)       Balance Overall balance assessment: Needs assistance         Standing balance support: Bilateral upper extremity supported, During functional activity, Reliant on assistive device for balance Standing balance-Leahy Scale: Fair                               Pertinent Vitals/Pain Pain Assessment Pain Assessment: 0-10 Pain Score: 5  Pain Location: L knee Pain Descriptors / Indicators: Discomfort, Sore Pain Intervention(s): Limited activity within patient's tolerance, Monitored during session, Ice applied, Repositioned    Home Living Family/patient expects to be discharged to:: Private residence Living Arrangements: Spouse/significant other Available Help at Discharge: Family;Available 24 hours/day Type of Home: House Home Access: Stairs to enter Entrance Stairs-Rails: Right Entrance Stairs-Number of Steps: 3-garage   Home Layout: One level;Other (Comment) Home Equipment:  Crutches      Prior Function Prior Level of Function : Independent/Modified Independent                     Hand Dominance        Extremity/Trunk Assessment   Upper  Extremity Assessment Upper Extremity Assessment: Overall WFL for tasks assessed    Lower Extremity Assessment Lower Extremity Assessment: Generalized weakness    Cervical / Trunk Assessment Cervical / Trunk Assessment: Normal  Communication   Communication: No difficulties  Cognition Arousal/Alertness: Awake/alert Behavior During Therapy: WFL for tasks assessed/performed Overall Cognitive Status: Within Functional Limits for tasks assessed                                          General Comments      Exercises     Assessment/Plan    PT Assessment Patient needs continued PT services  PT Problem List Decreased strength;Decreased range of motion;Decreased mobility;Decreased activity tolerance;Decreased balance;Decreased knowledge of use of DME;Pain       PT Treatment Interventions DME instruction;Gait training;Balance training;Stair training;Functional mobility training;Therapeutic activities;Patient/family education    PT Goals (Current goals can be found in the Care Plan section)  Acute Rehab PT Goals Patient Stated Goal: to 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 Formulation: With patient/family Time For Goal Achievement: 05/31/21 Potential to Achieve Goals: Good    Frequency Min 5X/week     Co-evaluation               AM-PAC PT "6 Clicks" Mobility  Outcome Measure Help needed turning from your back to your side while in a flat bed without using bedrails?: None Help needed moving from lying on your back to sitting on the side of a flat bed without using bedrails?: None Help needed moving to and from a bed to a chair (including a wheelchair)?: A Little Help needed standing up from a chair using your arms (e.g., wheelchair or bedside chair)?: A Little Help needed to walk in hospital room?: A Little Help needed climbing 3-5 steps with a railing? : A Little 6 Click Score: 20    End of Session Equipment Utilized During  Treatment: Left knee immobilizer Activity Tolerance: Patient tolerated treatment well Patient left: in bed;with call bell/phone within reach;with family/visitor present   PT Visit Diagnosis: Pain;Other abnormalities of gait and mobility (R26.89) Pain - Right/Left: Left Pain - part of body: Knee    Time: 0109-3235 PT Time Calculation (min) (ACUTE ONLY): 21 min   Charges:   PT Evaluation $PT Eval Low Complexity: [place], PT Acute Rehabilitation  Office: [phone] Pager: [phone]

## 2021-05-17 NOTE — Progress Notes (Signed)
Secure chat with RN re PICC placement.  Plan to place tomorrow.  Current PIV working well, no dc plans documented at this time.

## 2021-05-17 NOTE — Hospital Course (Signed)
° ° ° °  Subjective: 2 Days Post-Op Procedure(s) (LRB): LEFT TOTAL KNEE IRRIGATION AND DEBRIDEMENT, EXCISION LEFT TOTAL KNEE ARTHROPLASTY WITH ANTIBIOTIC SPACERS (Left) Awake, alert and oriented x 4. 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 I explained that he may need to be slow in returning to full weight bearing as this temporary prostheis is not permanent and will need to be exchange and is not cemented in place.  Patient reports pain as moderate.    Objective:   VITALS:  Temp:  [98 F (36.7 C)-99.5 F (37.5 C)] 98.2 F (36.8 C) (02/26 0919) Pulse Rate:  [65-79] 73 (02/26 0919) Resp:  [12-18] 18 (02/26 0513) BP: (107-119)/(57-71) 116/70 (02/26 0919) SpO2:  [91 %-100 %] 100 % (02/26 0919)  Neurologically intact ABD soft Neurovascular intact Sensation intact distally Intact pulses distally Dorsiflexion/Plantar flexion intact Incision: dressing C/D/I and moderate drainage No cellulitis present   LABS Recent Labs    05/16/21 0318  HGB 11.9*  WBC 7.7  PLT 185   Recent Labs    05/16/21 0318  NA 133*  K 3.8  CL 100  CO2 27  BUN 15  CREATININE 0.93  GLUCOSE 159*   No results for input(s): LABPT, INR in the last 72 hours.   Assessment/Plan: 2 Days Post-Op Procedure(s) (LRB): LEFT TOTAL KNEE IRRIGATION AND DEBRIDEMENT, EXCISION LEFT TOTAL KNEE ARTHROPLASTY WITH ANTIBIOTIC SPACERS (Left)  Advance diet Up with therapy D/C IV fluids Continue ABX therapy due to Post-op infection Check CRP in the AM. Basil Dess 05/17/2021, 12:21 PM

## 2021-05-17 NOTE — Plan of Care (Signed)

## 2021-05-17 NOTE — Progress Notes (Signed)
Hypoglycemic Event  CBG: 59  Treatment: 8 oz juice/soda  Symptoms: None  Follow-up CBG: Time:08:35 CBG Result:72  Possible Reasons for Event: Medication regimen:    Comments/MD notified:Dr. Louanne Skye made aware, new orders received    [facility] [name]

## 2021-05-18 ENCOUNTER — Encounter (HOSPITAL_COMMUNITY): Payer: Self-pay | Admitting: Orthopaedic Surgery

## 2021-05-18 DIAGNOSIS — T8454XD Infection and inflammatory reaction due to internal left knee prosthesis, subsequent encounter: Secondary | ICD-10-CM | POA: Diagnosis not present

## 2021-05-18 LAB — GLUCOSE, CAPILLARY
Glucose-Capillary: 136 mg/dL — ABNORMAL HIGH (ref 70–99)
Glucose-Capillary: 116 mg/dL — ABNORMAL HIGH (ref 70–99)
Glucose-Capillary: 107 mg/dL — ABNORMAL HIGH (ref 70–99)
Glucose-Capillary: 98 mg/dL (ref 70–99)

## 2021-05-18 MED ORDER — SODIUM CHLORIDE 0.9% FLUSH
10.0000 mL | Freq: Two times a day (BID) | INTRAVENOUS | Status: DC
  Administered 2021-05-18 – 2021-05-19 (×2): 10 mL

## 2021-05-18 MED ORDER — CHLORHEXIDINE GLUCONATE CLOTH 2 % EX PADS
6.0000 | MEDICATED_PAD | Freq: Every day | CUTANEOUS | Status: DC
  Administered 2021-05-18: 6 via TOPICAL

## 2021-05-18 MED ORDER — SODIUM CHLORIDE 0.9% FLUSH: 10.0000 mL | INTRAVENOUS | Status: DC | PRN

## 2021-05-18 NOTE — Progress Notes (Signed)
PHARMACY CONSULT NOTE FOR:  OUTPATIENT  PARENTERAL ANTIBIOTIC THERAPY (OPAT)  Indication: Left Knee PJI Regimen: Cefazolin 2g Q8H End date: April 8th, 2023  IV antibiotic discharge orders are pended. To discharging provider:  please sign these orders via discharge navigator,  Select New Orders & click on the button choice - Manage This Unsigned Work.     Thank you for allowing pharmacy to be a part of this patient's care.  Elpidio Anis, PharmD Candidate [phone] 05/18/2021, 9:22 AM

## 2021-05-18 NOTE — Progress Notes (Signed)
Patient ID: [name], male   DOB: 1955-05-29, 66 y.o.   MRN: [id number] The patient is awake and alert.  His postoperative pain of his left knee is appropriate.  His dressing has only scant bloody drainage.  I do appreciate the infectious disease service consulting on the patient.  A PICC line has been ordered and will hopefully be placed today.  His disposition will be determined once he has a PICC line, finalization of antibiotics as determined and home health services is set up for 6 weeks of IV antibiotics, PICC line care and therapy for mobility.

## 2021-05-18 NOTE — Progress Notes (Signed)
Peripherally Inserted Central Catheter Placement  The IV Nurse has discussed with the patient and/or persons authorized to consent for the patient, the purpose of this procedure and the potential benefits and risks involved with this procedure.  The benefits include less needle sticks, lab draws from the catheter, and the patient may be discharged home with the catheter. Risks include, but not limited to, infection, bleeding, blood clot (thrombus formation), and puncture of an artery; nerve damage and irregular heartbeat and possibility to perform a PICC exchange if needed/ordered by physician.  Alternatives to this procedure were also discussed.  Bard Power PICC patient education guide, fact sheet on infection prevention and patient information card has been provided to patient /or left at bedside.    PICC Placement Documentation  PICC Single Lumen 05/18/21 Right Brachial 39 cm 0 cm (Active)  Indication for Insertion or Continuance of Line Prolonged intravenous therapies 05/18/21 1447  Exposed Catheter (cm) 0 cm 05/18/21 1447  Site Assessment Clean, Dry, Intact 05/18/21 1447  Line Status Flushed;Blood return noted;Saline locked 05/18/21 1447  Dressing Type Transparent 05/18/21 1447  Dressing Status Antimicrobial disc in place 05/18/21 1447  Dressing Change Due 05/25/21 05/18/21 1447       Scotty Court 05/18/2021, 2:49 PM

## 2021-05-18 NOTE — Progress Notes (Signed)
Physical Therapy Treatment Patient Details Name: [name] MRN: [id number] DOB: 1955-12-10 Today's Date: 05/18/2021   History of Present Illness 66 yo male admitted with L total knee prosthetic infection. S/P I&D, placement of antibiotic articulating spacer 2/24.    PT Comments    Progressing well.    Recommendations for follow up therapy are one component of a multi-disciplinary discharge planning process, led by the attending physician.  Recommendations may be updated based on patient status, additional functional criteria and insurance authorization.  Follow Up Recommendations  No PT follow up     Assistance Recommended at Discharge PRN  Patient can return home with the following Assistance with cooking/housework;Assist for transportation;Help with stairs or ramp for entrance;A little help with bathing/dressing/bathroom   Equipment Recommendations  Rolling walker (2 wheels)-it pt decides he wants one   Recommendations for Other Services       Precautions / Restrictions Precautions Precautions: Knee Required Braces or Orthoses: Knee Immobilizer - Left Knee Immobilizer - Left: On when out of bed or walking Restrictions Weight Bearing Restrictions: No LLE Weight Bearing: Weight bearing as tolerated Other Position/Activity Restrictions: "limited" knee flexion     Mobility  Bed Mobility Overal bed mobility: Modified Independent             General bed mobility comments: Pt hooks operative leg with good leg.    Transfers Overall transfer level: Needs assistance Equipment used: Rolling walker (2 wheels) Transfers: Sit to/from Stand Sit to Stand: Supervision           General transfer comment: Cues for safety, technique, hand/LE placement.    Ambulation/Gait Ambulation/Gait assistance: Supervision Gait Distance (Feet): 200 Feet Assistive device: Rolling walker (2 wheels) Gait Pattern/deviations: Step-to pattern       General Gait Details:  Tolerated increased distance well.   Stairs Stairs: Yes Stairs assistance: Supervision Stair Management: Step to pattern, Forwards, Two rails Number of Stairs: 2 General stair comments: up and over portable stairs x 1. cues for safety, sequence.   Wheelchair Mobility    Modified Rankin (Stroke Patients Only)       Balance Overall balance assessment: Needs assistance         Standing balance support: Bilateral upper extremity supported, During functional activity, Reliant on assistive device for balance Standing balance-Leahy Scale: Fair                              Cognition Arousal/Alertness: Awake/alert Behavior During Therapy: WFL for tasks assessed/performed Overall Cognitive Status: Within Functional Limits for tasks assessed                                          Exercises      General Comments        Pertinent Vitals/Pain Pain Assessment Pain Assessment: 0-10 Pain Score: 5  Pain Location: L knee Pain Descriptors / Indicators: Discomfort, Sore Pain Intervention(s): Limited activity within patient's tolerance, Monitored during session    Home Living                          Prior Function            PT Goals (current goals can now be found in the care plan section) Progress towards PT goals: Progressing toward goals    Frequency  Min 5X/week      PT Plan Current plan remains appropriate    Co-evaluation              AM-PAC PT "6 Clicks" Mobility   Outcome Measure  Help needed turning from your back to your side while in a flat bed without using bedrails?: None Help needed moving from lying on your back to sitting on the side of a flat bed without using bedrails?: None Help needed moving to and from a bed to a chair (including a wheelchair)?: A Little Help needed standing up from a chair using your arms (e.g., wheelchair or bedside chair)?: A Little Help needed to walk in hospital  room?: A Little Help needed climbing 3-5 steps with a railing? : A Little 6 Click Score: 20    End of Session Equipment Utilized During Treatment: Left knee immobilizer Activity Tolerance: Patient tolerated treatment well Patient left: in chair;with call bell/phone within reach;with family/visitor present   PT Visit Diagnosis: Pain;Other abnormalities of gait and mobility (R26.89) Pain - Right/Left: Left Pain - part of body: Knee     Time: 1100-1114 PT Time Calculation (min) (ACUTE ONLY): 14 min  Charges:  $Gait Training: 8-22 mins                         Doreatha Massed, PT Acute Rehabilitation  Office: [phone] Pager: [phone]

## 2021-05-18 NOTE — Discharge Instructions (Addendum)
You may put all of your weight on your left knee as comfort allows. Still ambulate using an assistive device such as a crutch or cane on your opposite side to offload the left knee. You may get your incision/dressing wet in the shower. Do get over the counter stool softener to take daily (Miralax/Colace).

## 2021-05-18 NOTE — TOC Initial Note (Addendum)
Transition of Care [facility]) - Initial/Assessment Note   Patient Details  Name: [name] MRN: [id number] Date of Birth: [birth year]  [facility]) CM/SW Contact:    Sherie Don, LCSW Phone Number: 05/18/2021, 3:23 PM  Clinical Narrative: Patient will need to discharge home with several weeks of IV antibiotics. Referral for IV antibiotics given to Pam with [facility]. Per Pam, as patient has HTA insurance he cannot received HHRN through [employer] or [employer]. CSW made [facility] referral to Amy with [facility], which was accepted. RN will Latricia Heft will start on 05/20/21.  CSW updated wife, [name]. Per wife, patient has crutches at home, but will need a rolling walker. CSW instructed wife not to give away the walker once received as patient will not be eligible for a new one through Medicare for 5 years. CSW made tentative DME referral to [place] with [facility], but CSW is awaiting DME orders. CSW notified RN of need for walker order.  Addendum: Rolling walker order is in. [employer] to deliver walker to patient's room.  Expected Discharge Plan: [place] Barriers to Discharge: No Barriers Identified  Patient Goals and CMS Choice Patient states their goals for this hospitalization and ongoing recovery are:: Return home CMS Medicare.gov Compare Post Acute Care list provided to:: Patient Represenative (must comment) Choice offered to / list presented to : Spouse  Expected Discharge Plan and Services Expected Discharge Plan: [place] In-house Referral: Clinical Social Work Post Acute Care Choice: [place] arrangements for the past 2 months: [place]           DME Arranged: Walker rolling DME Agency: [facility] Date DME Agency Contacted: 05/18/21 Time DME Agency Contacted: [phone] Representative spoke with at DME Agency: Andee Poles HH Arranged: IV Antibiotics, RN [place] Agency: [facility], [place] Date [place]:  05/18/21 Representative spoke with at [place]: Osborne Casco), Latricia Heft (Amy)  Prior Living Arrangements/Services Living arrangements for the past 2 months: Single Family Home Lives with:: Spouse Do you feel safe going back to the place where you live?: Yes      Need for Family Participation in Patient Care: Yes (Comment) Care giver support system in place?: Yes (comment) Current home services: DME (Crutches) Criminal Activity/Legal Involvement Pertinent to Current Situation/Hospitalization: No - Comment as needed  Activities of Daily Living Home Assistive Devices/Equipment: Eyeglasses, CBG Meter ADL Screening (condition at time of admission) Patient's cognitive ability adequate to safely complete daily activities?: Yes Is the patient deaf or have difficulty hearing?: No Does the patient have difficulty seeing, even when wearing glasses/contacts?: No Does the patient have difficulty concentrating, remembering, or making decisions?: No Patient able to express need for assistance with ADLs?: Yes Does the patient have difficulty dressing or bathing?: No Independently performs ADLs?: Yes (appropriate for developmental age) Does the patient have difficulty walking or climbing stairs?: No Weakness of Legs: Left Weakness of Arms/Hands: None  Emotional Assessment Appearance:: Appears stated age Orientation: : Oriented to Self, Oriented to Place, Oriented to  Time, Oriented to Situation Alcohol / Substance Use: Not Applicable Psych Involvement: No (comment)  Admission diagnosis:  Infection of total left knee replacement ([place]) [T84.54XA] Patient Active Problem List   Diagnosis Date Noted   Vaccine counseling 05/04/2021   Skin lesion 01/01/2021   Type 2 diabetes mellitus with other specified complication ([place]) 38/12/1749   Nausea and vomiting 09/11/2020   MSSA (methicillin susceptible Staphylococcus aureus) infection 09/03/2020   Effusion, left knee  08/14/2020   Infection of total left  knee replacement ([place]) 08/14/2020   Status post total left knee replacement 03/21/2019   S/P carpal tunnel release right 02/22/19 03/08/2019   Carpal tunnel syndrome of right wrist    HTN (hypertension) 10/27/2018   Prostate cancer ([place]) 05/30/2017   Malignant neoplasm of prostate ([place]) 04/28/2017   Chronic pain of both knees 02/21/2017   Unilateral primary osteoarthritis, left knee 02/21/2017   Unilateral primary osteoarthritis, right knee 02/21/2017   PCP:  [name], MD Pharmacy:   [id number] - [place], [place] [address] [place] [address] Phone: [phone] Fax: [phone]  [place], [place] [address] [place] [address] Phone: [phone] Fax: [phone]  Readmission Risk Interventions No flowsheet data found.

## 2021-05-18 NOTE — Care Management Important Message (Signed)
Important Message  Patient Details IM Letter placed in Patients room. Name: [name] MRN: [id number] Date of Birth: 1955-10-27   Medicare Important Message Given:  Yes     Kerin Salen 05/18/2021, 2:08 PM

## 2021-05-18 NOTE — Consult Note (Signed)
[facility] for Infectious Disease  Date of Admission:  05/15/2021   Total days of inpatient antibiotics 3  Principal Problem:   Infection of total left knee replacement (HCC)          Assessment: 23 YM with left TKA on 22/6333 complicated with chronic infection SP DAIR on 07/2020 with Cx+ MSSA treated with cefazolin x 6 weeks(+rifampin given only partially due to side effect of nausea) followed by suppressive cefadroxil. He is followed by Dr. Lucianne Lei Dam(ID). Admitted for left knee infection and 2 stage revision SP hardware removal and antibiotic spacer placement.  #Left knee PJI SP excisional arthoplasty with removal of all hardware with antibiotic spacer placement #OR cx+ rare staph aures(likely MSSA)  Recommendations: -Continue cefazolin to complete 6 weeks of antibiotics EOT 06/27/21(OPAT orders placed by Dr. Baxter Flattery on 2/26). -Place PICC -Pt to call to follow-up with Dr. Tommy Medal (Infectious Disease)in 4-6 weeks   Microbiology:   Antibiotics: Cefazolin 2/24-p Cefadroxil 2/26 Vancomycin 2/24 Cultures: OR Cx on 2/24 rare staph aureus   SUBJECTIVE: Resting in bed. No new complaints. Wife at bedside.  Review of Systems: Review of Systems  All other systems reviewed and are negative.   Scheduled Meds:  aspirin  81 mg Oral BID   docusate sodium  100 mg Oral BID   escitalopram  10 mg Oral Daily   losartan  50 mg Oral Daily   And   hydrochlorothiazide  12.5 mg Oral Daily   insulin aspart  0-15 Units Subcutaneous TID WC   insulin aspart  0-5 Units Subcutaneous QHS   insulin glargine-yfgn  60 Units Subcutaneous Daily   oxyCODONE  15 mg Oral Q12H   pantoprazole  40 mg Oral Daily   rosuvastatin  20 mg Oral Daily   Continuous Infusions:  sodium chloride      ceFAZolin (ANCEF) IV Stopped (05/18/21 0658)   methocarbamol (ROBAXIN) IV     PRN Meds:.acetaminophen, alum & mag hydroxide-simeth, diphenhydrAMINE, HYDROmorphone (DILAUDID) injection,  menthol-cetylpyridinium **OR** phenol, methocarbamol **OR** methocarbamol (ROBAXIN) IV, metoCLOPramide **OR** metoCLOPramide (REGLAN) injection, ondansetron **OR** ondansetron (ZOFRAN) IV, oxyCODONE, oxyCODONE No Known Allergies  OBJECTIVE: Vitals:   05/17/21 1335 05/17/21 2206 05/18/21 0651 05/18/21 0928  BP: (!) 102/56 118/61 (!) 102/59 113/66  Pulse: 68 73 67 72  Resp: 18 18 18 17   Temp: 98.6 F (37 C) 98.2 F (36.8 C) 98.1 F (36.7 C) 99.1 F (37.3 C)  TempSrc: Oral Oral Oral Oral  SpO2: 97% 98% 98% 100%  Weight:      Height:       Body mass index is 30.17 kg/m.  Physical Exam Constitutional:      General: He is not in acute distress.    Appearance: He is normal weight. He is not toxic-appearing.  HENT:     Head: Normocephalic and atraumatic.     Right Ear: External ear normal.     Left Ear: External ear normal.     Nose: No congestion or rhinorrhea.     Mouth/Throat:     Mouth: Mucous membranes are moist.     Pharynx: Oropharynx is clear.  Eyes:     Extraocular Movements: Extraocular movements intact.     Conjunctiva/sclera: Conjunctivae normal.     Pupils: Pupils are equal, round, and reactive to light.  Cardiovascular:     Rate and Rhythm: Normal rate and regular rhythm.     Heart sounds: No murmur heard.   No friction rub.  No gallop.  Pulmonary:     Effort: Pulmonary effort is normal.     Breath sounds: Normal breath sounds.  Abdominal:     General: Abdomen is flat. Bowel sounds are normal.     Palpations: Abdomen is soft.  Musculoskeletal:        General: No swelling.     Cervical back: Normal range of motion and neck supple.     Comments: Left knee wound bandage  Skin:    General: Skin is warm and dry.  Neurological:     General: No focal deficit present.     Mental Status: He is oriented to person, place, and time.  Psychiatric:        Mood and Affect: Mood normal.      Lab Results Lab Results  Component Value Date   WBC 7.7 05/16/2021    HGB 11.9 (L) 05/16/2021   HCT 35.0 (L) 05/16/2021   MCV 86.4 05/16/2021   PLT 185 05/16/2021    Lab Results  Component Value Date   CREATININE 0.93 05/16/2021   BUN 15 05/16/2021   NA 133 (L) 05/16/2021   K 3.8 05/16/2021   CL 100 05/16/2021   CO2 27 05/16/2021    Lab Results  Component Value Date   ALT 16 10/03/2020   AST 23 10/03/2020   BILITOT 0.5 10/03/2020        Laurice Record, [place] for Infectious Disease [facility] 05/18/2021, 11:53 AM

## 2021-05-18 NOTE — Plan of Care (Signed)
°  Problem: Health Behavior/Discharge Planning: Goal: Ability to manage health-related needs will improve Outcome: Progressing   Problem: Activity: Goal: Risk for activity intolerance will decrease Outcome: Progressing   Problem: Nutrition: Goal: Adequate nutrition will be maintained Outcome: Progressing   Problem: Elimination: Goal: Will not experience complications related to bowel motility Outcome: Progressing   Problem: Pain Managment: Goal: General experience of comfort will improve Outcome: Progressing   Problem: Safety: Goal: Ability to remain free from injury will improve Outcome: Progressing   Problem: Skin Integrity: Goal: Risk for impaired skin integrity will decrease Outcome: Progressing

## 2021-05-19 LAB — GLUCOSE, CAPILLARY
Glucose-Capillary: 88 mg/dL (ref 70–99)
Glucose-Capillary: 94 mg/dL (ref 70–99)

## 2021-05-19 MED ORDER — OXYCODONE HCL 5 MG PO TABS: 5.0000 mg | ORAL_TABLET | Freq: Four times a day (QID) | ORAL | 0 refills | Status: DC | PRN

## 2021-05-19 MED ORDER — TIZANIDINE HCL 4 MG PO TABS
4.0000 mg | ORAL_TABLET | Freq: Three times a day (TID) | ORAL | 0 refills | Status: DC | PRN
Start: 2021-05-19 — End: 2021-05-19

## 2021-05-19 MED ORDER — ASPIRIN 81 MG PO CHEW: 81.0000 mg | CHEWABLE_TABLET | Freq: Two times a day (BID) | ORAL | 0 refills | Status: DC

## 2021-05-19 MED ORDER — TIZANIDINE HCL 4 MG PO TABS: 4.0000 mg | ORAL_TABLET | Freq: Three times a day (TID) | ORAL | 0 refills | Status: DC | PRN

## 2021-05-19 MED ORDER — BISACODYL 10 MG RE SUPP
10.0000 mg | Freq: Every day | RECTAL | Status: DC | PRN
  Administered 2021-05-19: 10 mg via RECTAL
  Filled 2021-05-19 (×2): qty 1

## 2021-05-19 MED ORDER — CEFAZOLIN IV (FOR PTA / DISCHARGE USE ONLY): 2.0000 g | Freq: Three times a day (TID) | INTRAVENOUS | 0 refills | Status: DC

## 2021-05-19 MED ORDER — ASPIRIN 81 MG PO CHEW
81.0000 mg | CHEWABLE_TABLET | Freq: Two times a day (BID) | ORAL | 0 refills | Status: DC
Start: 2021-05-19 — End: 2021-05-25

## 2021-05-19 NOTE — Discharge Summary (Signed)
Patient ID: [name] MRN: [id number] DOB/AGE: July 08, 1955 66 y.o.  Admit date: 05/15/2021 Discharge date: 05/19/2021  Admission Diagnoses:  Principal Problem:   Infection of total left knee replacement [facility])   Discharge Diagnoses:  Same  Past Medical History:  Diagnosis Date   Arthritis    Depression    Diabetes mellitus without complication ([place]) 5093   Diabetes Type II   GERD (gastroesophageal reflux disease)    diet controlled   HTN (hypertension) 10/27/2018   Hyperlipidemia    Internal hemorrhoids 2014   noted on colonoscopy   MSSA (methicillin susceptible Staphylococcus aureus) infection 09/03/2020   Osteoarthritis of both knees    Prostate cancer ([place])    prostate   Sigmoid diverticulosis 2014   Mild, noted on colonoscopy   Skin lesion 01/01/2021   Type 2 diabetes mellitus with other specified complication ([place]) 26/71/2458   Vaccine counseling 05/04/2021    Surgeries: Procedure(s): LEFT TOTAL KNEE IRRIGATION AND DEBRIDEMENT, EXCISION LEFT TOTAL KNEE ARTHROPLASTY WITH ANTIBIOTIC SPACERS on 05/15/2021   Consultants:   Discharged Condition: Improved  Hospital Course: [name] is an 66 y.o. male who was admitted 05/15/2021 for operative treatment ofInfection of total left knee replacement ([place]). Patient has severe unremitting pain that affects sleep, daily activities, and work/hobbies. After pre-op clearance the patient was taken to the operating room on 05/15/2021 and underwent  Procedure(s): LEFT TOTAL KNEE IRRIGATION AND DEBRIDEMENT, EXCISION LEFT TOTAL KNEE ARTHROPLASTY WITH ANTIBIOTIC SPACERS.    Patient was given perioperative antibiotics:  Anti-infectives (From admission, onward)    Start     Dose/Rate Route Frequency Ordered Stop   05/19/21 0000  ceFAZolin (ANCEF) IVPB        2 g Intravenous Every 8 hours 05/19/21 0733 06/28/21 2359   05/16/21 1000  cefadroxil (DURICEF) capsule 1,000 mg  Status:  Discontinued        1,000 mg Oral 2 times daily  05/15/21 2054 05/16/21 1514   05/15/21 2200  ceFAZolin (ANCEF) IVPB 2g/100 mL premix        2 g 200 mL/hr over 30 Minutes Intravenous Every 8 hours 05/15/21 2055     05/15/21 1730  vancomycin (VANCOCIN) powder  Status:  Discontinued          As needed 05/15/21 1731 05/15/21 2052   05/15/21 1715  ceFAZolin (ANCEF) IVPB 2g/100 mL premix        2 g 200 mL/hr over 30 Minutes Intravenous  Once 05/15/21 1701 05/15/21 1655   05/15/21 1608  ceFAZolin (ANCEF) 2-4 GM/100ML-% IVPB       Note to Pharmacy: Enrigue Catena E: cabinet override      05/15/21 1608 05/16/21 0414        Patient was given sequential compression devices, early ambulation, and chemoprophylaxis to prevent DVT.  Patient benefited maximally from hospital stay and there were no complications.   The infectious disease consult service did see the patient.  A PICC line was placed.  He will be on 6 weeks of IV antibiotics following this hospitalization.  Recent vital signs: Patient Vitals for the past 24 hrs:  BP Temp Temp src Pulse Resp SpO2  05/19/21 0621 98/78 98.6 F (37 C) Oral 73 16 97 %  05/18/21 2217 138/69 98.4 F (36.9 C) Oral 75 18 97 %  05/18/21 1342 (!) 100/53 98.9 F (37.2 C) Oral 72 20 97 %  05/18/21 0928 113/66 99.1 F (37.3 C) Oral 72 17 100 %     Recent laboratory  studies: No results for input(s): WBC, HGB, HCT, PLT, NA, K, CL, CO2, BUN, CREATININE, GLUCOSE, INR, CALCIUM in the last 72 hours.  Invalid input(s): PT, 2   Discharge Medications:   Allergies as of 05/19/2021   No Known Allergies      Medication List     STOP taking these medications    cefadroxil 500 MG capsule Commonly known as: DURICEF   naproxen sodium 220 MG tablet Commonly known as: ALEVE       TAKE these medications    aspirin 81 MG chewable tablet Chew 1 tablet (81 mg total) by mouth 2 (two) times daily.   ceFAZolin  IVPB Commonly known as: ANCEF Inject 2 g into the vein every 8 (eight) hours. Indication:  Left Knee  PJI First Dose: Yes Last Day of Therapy:  April 8th, 2023 Labs - Once weekly:  CBC/D and BMP, Labs - Every other week:  ESR and CRP Method of administration: IV Push Method of administration may be changed at the discretion of home infusion pharmacist based upon assessment of the patient and/or caregiver's ability to self-administer the medication ordered.   diclofenac 75 MG EC tablet Commonly known as: VOLTAREN TAKE 1 TABLET BY MOUTH 2 TIMES DAILY BETWEEN MEALS AS NEEDED.   escitalopram 10 MG tablet Commonly known as: LEXAPRO Take 10 mg by mouth daily.   Fish Oil Maximum Strength 1200 MG Caps Take 1,200 mg by mouth daily.   fluconazole 100 MG tablet Commonly known as: DIFLUCAN Take 1 tablet (100 mg total) by mouth daily. What changed:  when to take this reasons to take this   fluorouracil 5 % cream Commonly known as: EFUDEX Apply 1 application topically 2 (two) times daily as needed (cancer spots).   FreeStyle Libre 14 Day Sensor Misc SMARTSIG:Topical Every 10 Days   insulin aspart 100 UNIT/ML injection Commonly known as: novoLOG Inject 10-20 Units into the skin 3 (three) times daily with meals. Per sliding scale   losartan-hydrochlorothiazide 50-12.5 MG tablet Commonly known as: HYZAAR TAKE 1 TABLET BY MOUTH EVERY DAY   ondansetron 4 MG tablet Commonly known as: Zofran Take 1 tablet (4 mg total) by mouth every 8 (eight) hours as needed for nausea or vomiting.   oxyCODONE 5 MG immediate release tablet Commonly known as: Oxy IR/ROXICODONE Take 1-2 tablets (5-10 mg total) by mouth every 6 (six) hours as needed for moderate pain (pain score 4-6). What changed: how much to take   rosuvastatin 20 MG tablet Commonly known as: CRESTOR TAKE 1 TABLET BY MOUTH EVERY DAY   tiZANidine 4 MG tablet Commonly known as: ZANAFLEX Take 1 tablet (4 mg total) by mouth every 8 (eight) hours as needed for muscle spasms.   Tyler Aas FlexTouch 100 UNIT/ML FlexTouch Pen Generic drug:  insulin degludec Inject 80 Units into the skin daily.               Durable Medical Equipment  (From admission, onward)           Start     Ordered   05/18/21 1536  For home use only DME Walker rolling  Once       Question Answer Comment  Walker: With 5 Inch Wheels   Patient needs a walker to treat with the following condition Assistance needed for mobility      05/18/21 1535              Discharge Care Instructions  (From admission, onward)  Start     Ordered   05/19/21 0000  Change dressing on IV access line weekly and PRN  (Home infusion instructions - [facility] )        05/19/21 0733            Diagnostic Studies: [place] EKG SITE RITE  Result Date: 05/17/2021 If Site Rite image not attached, placement could not be confirmed due to current cardiac rhythm.   Disposition: Discharge disposition: 01-Home or Self Care       Discharge Instructions     [facility] pharmacist to adjust dose for Vancomycin, Aminoglycosides and other anti-infective therapies as requested by physician.   Complete by: As directed    [facility] to provide Cath Flo 60m   Complete by: As directed    Administer for PICC line occlusion and as ordered by physician for other access device issues.   Anaphylaxis Kit: Provided to treat any anaphylactic reaction to the medication being provided to the patient if First Dose or when requested by physician   Complete by: As directed    Epinephrine 138mml vial / amp: Administer 0.66m30m0.66ml52mubcutaneously once for moderate to severe anaphylaxis, nurse to call physician and pharmacy when reaction occurs and call 911 if needed for immediate care   Diphenhydramine 50mg46mIV vial: Administer 25-50mg 55mM PRN for first dose reaction, rash, itching, mild reaction, nurse to call physician and pharmacy when reaction occurs   Sodium Chloride 0.9% NS 500ml I56mdminister if needed for hypovolemic blood  pressure drop or as ordered by physician after call to physician with anaphylactic reaction   Change dressing on IV access line weekly and PRN   Complete by: As directed    Discharge patient   Complete by: As directed    Can discharge to home in the afternoon   Discharge disposition: 01-Home or Self Care   Discharge patient date: 05/19/2021   Flush IV access with Sodium Chloride 0.9% and Heparin 10 units/ml or 100 units/ml   Complete by: As directed    Home infusion instructions - [facility]   Complete by: As directed    Instructions: Flush IV access with Sodium Chloride 0.9% and Heparin 10units/ml or 100units/ml   Change dressing on IV access line: Weekly and PRN   Instructions Cath Flo 2mg: Ad7mister for PICC Line occlusion and as ordered by physician for other access device   [facility] pharmacist to adjust dose for: Vancomycin, Aminoglycosides and other anti-infective therapies as requested by physician   Method of administration may be changed at the discretion of home infusion pharmacist based upon assessment of the patient and/or caregivers ability to self-administer the medication ordered   Complete by: As directed         Follow-up Information     [facility] week(s).   Specialty: Orthopedic Surgery Contact information: [phone] [facility] Follow up.   Why: [facility] will provide home IV antibiotics.        [facility]   Why: [facility] will provide nursing for PICC line maintenance and labs.                 Signed: [facility] Rossetti23, 7:33 AM

## 2021-05-19 NOTE — Progress Notes (Signed)
Patient ID: [name], male   DOB: [birth year], 66 y.o.   MRN: [id number] No acute changes.  Has PICC line in place.  Cultures have come back with Staph Aureus.  Stable overall.  Can be discharged to home later today.

## 2021-05-20 LAB — AEROBIC/ANAEROBIC CULTURE W GRAM STAIN (SURGICAL/DEEP WOUND)

## 2021-05-20 LAB — ACID FAST SMEAR (AFB, MYCOBACTERIA): Acid Fast Smear: NEGATIVE

## 2021-05-21 ENCOUNTER — Telehealth: Payer: Self-pay | Admitting: Pharmacist

## 2021-05-21 NOTE — Telephone Encounter (Signed)
Provided verbal orders to [place] with [facility] for patient's switch from Ancef to vancomycin. Recommended loading dose given lack of MRSA coverage at this time. Patient has received vancomycin before so no need for first dose. End date remains the same unless Dr. Baxter Flattery states otherwise. ? ?Alfonse Spruce, PharmD, CPP ?Clinical Pharmacist Practitioner ?Infectious Diseases Clinical Pharmacist ?[place] [facility] ? ?

## 2021-05-21 NOTE — Telephone Encounter (Signed)
Patient's wife called, they have not received the new antibiotic (vancomycin) yet and she is unsure if it will be administered the same way as the Ancef. Provided her with [facility] number and advised her to call to check on the status of delivery for the vancomycin. Also instructed her to ask about administration and to request demonstration by a nurse if it is different from administering the Ancef.  ? ?Beryle Flock, RN ? ?

## 2021-05-25 ENCOUNTER — Other Ambulatory Visit: Payer: Self-pay | Admitting: Orthopaedic Surgery

## 2021-05-25 ENCOUNTER — Telehealth: Payer: Self-pay

## 2021-05-25 MED ORDER — ASPIRIN 81 MG PO CHEW: 81.0000 mg | CHEWABLE_TABLET | Freq: Two times a day (BID) | ORAL | 0 refills | Status: DC

## 2021-05-25 NOTE — Telephone Encounter (Signed)
Just got a refill request from [facility] for his BASA '81mg'$  #180 ?[facility] ?

## 2021-05-26 ENCOUNTER — Ambulatory Visit (INDEPENDENT_AMBULATORY_CARE_PROVIDER_SITE_OTHER): Payer: HMO | Admitting: Internal Medicine

## 2021-05-26 ENCOUNTER — Telehealth: Payer: Self-pay | Admitting: Internal Medicine

## 2021-05-26 ENCOUNTER — Other Ambulatory Visit: Payer: Self-pay | Admitting: Orthopaedic Surgery

## 2021-05-26 ENCOUNTER — Telehealth: Payer: Self-pay

## 2021-05-26 ENCOUNTER — Other Ambulatory Visit: Payer: Self-pay

## 2021-05-26 ENCOUNTER — Telehealth: Payer: Self-pay | Admitting: Pharmacist

## 2021-05-26 VITALS — BP 138/78 | HR 70 | Temp 98.8°F | Ht 68.0 in | Wt 195.0 lb

## 2021-05-26 DIAGNOSIS — A4902 Methicillin resistant Staphylococcus aureus infection, unspecified site: Secondary | ICD-10-CM | POA: Diagnosis not present

## 2021-05-26 DIAGNOSIS — D702 Other drug-induced agranulocytosis: Secondary | ICD-10-CM

## 2021-05-26 DIAGNOSIS — D696 Thrombocytopenia, unspecified: Secondary | ICD-10-CM

## 2021-05-26 LAB — CBC WITH DIFFERENTIAL/PLATELET
Abs Immature Granulocytes: 0.09 10*3/uL — ABNORMAL HIGH (ref 0.00–0.07)
Basophils Absolute: 0 10*3/uL (ref 0.0–0.1)
Basophils Relative: 0 %
Eosinophils Absolute: 0.3 10*3/uL (ref 0.0–0.5)
Eosinophils Relative: 5 %
HCT: 35.2 % — ABNORMAL LOW (ref 39.0–52.0)
Hemoglobin: 11.6 g/dL — ABNORMAL LOW (ref 13.0–17.0)
Immature Granulocytes: 2 %
Lymphocytes Relative: 18 %
Lymphs Abs: 1.1 10*3/uL (ref 0.7–4.0)
MCH: 29.1 pg (ref 26.0–34.0)
MCHC: 33 g/dL (ref 30.0–36.0)
MCV: 88.4 fL (ref 80.0–100.0)
Monocytes Absolute: 0.4 10*3/uL (ref 0.1–1.0)
Monocytes Relative: 6 %
Neutro Abs: 4.2 10*3/uL (ref 1.7–7.7)
Neutrophils Relative %: 69 %
Platelets: 238 10*3/uL (ref 150–400)
RBC: 3.98 MIL/uL — ABNORMAL LOW (ref 4.22–5.81)
RDW: 13.1 % (ref 11.5–15.5)
WBC: 6 10*3/uL (ref 4.0–10.5)
nRBC: 0 % (ref 0.0–0.2)

## 2021-05-26 LAB — COMPREHENSIVE METABOLIC PANEL
ALT: 22 U/L (ref 0–44)
AST: 29 U/L (ref 15–41)
Albumin: 3.4 g/dL — ABNORMAL LOW (ref 3.5–5.0)
Alkaline Phosphatase: 73 U/L (ref 38–126)
Anion gap: 8 (ref 5–15)
BUN: 18 mg/dL (ref 8–23)
CO2: 28 mmol/L (ref 22–32)
Calcium: 9.1 mg/dL (ref 8.9–10.3)
Chloride: 100 mmol/L (ref 98–111)
Creatinine, Ser: 1.01 mg/dL (ref 0.61–1.24)
GFR, Estimated: >60 mL/min (ref >60)
Glucose, Bld: 134 mg/dL — ABNORMAL HIGH (ref 70–99)
Potassium: 4.2 mmol/L (ref 3.5–5.1)
Sodium: 136 mmol/L (ref 135–145)
Total Bilirubin: 0.4 mg/dL (ref 0.3–1.2)
Total Protein: 6.3 g/dL — ABNORMAL LOW (ref 6.5–8.1)

## 2021-05-26 MED ORDER — OXYCODONE HCL 5 MG PO TABS: 5.0000 mg | ORAL_TABLET | Freq: Four times a day (QID) | ORAL | 0 refills | Status: DC | PRN

## 2021-05-26 NOTE — Telephone Encounter (Signed)
Received request from pts pharmacy [facility] on behalf of pt. requesting refill on pt's Oxycodone '5mg'$ . Please advise ?

## 2021-05-26 NOTE — Telephone Encounter (Signed)
All good! Sorry about that. I will relay to [facility].

## 2021-05-26 NOTE — Telephone Encounter (Signed)
Received note from Amy, pharmacist at [facility] that patient will need to be set up for first dose of daptomycin since he has not had it before. Will route to triage to arrange set up. ?

## 2021-05-26 NOTE — Telephone Encounter (Signed)
Hey looks like patient has had daptomycin May of last year. Patient shouldn't need first dose. Please let us know if we can assist with anything else.  ? ?

## 2021-05-26 NOTE — Telephone Encounter (Signed)
We are changing to daptomycin!

## 2021-05-26 NOTE — Telephone Encounter (Signed)
Gave verbal order to [facility] to stop patient's vancomycin IV and start daptomycin 720 mg IV daily. Continue labs but discontinue vancomycin trough and add weekly CPK to lab list. Updated end date to April 10th. Also asked that they repeat CBC with diff and CMP on Thursday 3/9. ? ?Vivika Poythress L. Jru Pense, PharmD ?[facility] Clinical Pharmacist Practitioner ? ?

## 2021-05-26 NOTE — Telephone Encounter (Signed)
Mariann Laster with [facility] called to report critical labs for the patient. ?RBC-2.67 ?WBC-0.9 ?Platelets 10 ?

## 2021-05-26 NOTE — Addendum Note (Signed)
Addended by: Leatrice Jewels on: 05/26/2021 11:53 AM ? ? Modules accepted: Orders ? ?

## 2021-05-26 NOTE — Addendum Note (Signed)
Addended by: Leatrice Jewels on: 05/26/2021 11:46 AM ? ? Modules accepted: Orders ? ?

## 2021-05-26 NOTE — Telephone Encounter (Signed)
error 

## 2021-05-26 NOTE — Telephone Encounter (Signed)
Dr. Baxter Flattery called patient's wife and they are coming in today to see her to repeat labs and discuss plan. Thank you!

## 2021-05-26 NOTE — Telephone Encounter (Signed)
I called the patient to let him know that his labs are WNL, similar to when he was discharged.yesterday's lab must have been lab error. ? ?We will still plan to change to daptomycin daily. ? ? ?

## 2021-05-26 NOTE — Telephone Encounter (Signed)
Per chart review STAT labs have resulted and available for review in Epic. Updated Dr. Baxter Flattery that labs are ready for review. ?Leatrice Jewels, RMA ? ?

## 2021-05-26 NOTE — Progress Notes (Addendum)
RFV: follow up for MRSA pji and lab error  Patient ID: Lehman Prom, male   DOB: 04/27/1955, 66 y.o.   MRN: [id number]  HPI  66yo M with hx of prostate ca, MSSA PJI who had ongoing swelling and pain concerning for chronic left knee PJI, thus underwent excision arthroplasty of left total knee, I x D with placement of temp abtx articulating spacer on 2/24. It was presumed to be recurrence of MSSA, thus discharged on cefazolin. His cultures ended up identifying rare MRSA for which he was changed to vancomycin on 05/21/21. He had labs drawn on 3/6 which showed abn on cbc including leukopenia with wbc 0.9, hgb 7.X, and plt of 10. Labs were reported to [place] on the morning on 3/7 which necessitated him to come to clinic promptly for evaluation.  Home health labs: RBC-2.67 WBC-0.9 Platelets 10  His labs CBC at hospital showed : Lab Results  Component Value Date   WBC 7.7 05/16/2021   HGB 11.9 (L) 05/16/2021   HCT 35.0 (L) 05/16/2021   MCV 86.4 05/16/2021   PLT 185 05/16/2021   Patient denies any new rash, no bleeding of gums  No difficulty with picc line No fever, or chills  Still has some swelling to his left leg, but he does suffer from lymphadema to his left leg at baseline prior to surgery.    Once every 6 months - for prostate ca Outpatient Encounter Medications as of 05/26/2021  Medication Sig   aspirin 81 MG chewable tablet Chew 1 tablet (81 mg total) by mouth 2 (two) times daily.   ceFAZolin (ANCEF) IVPB Inject 2 g into the vein every 8 (eight) hours. Indication:  Left Knee PJI First Dose: Yes Last Day of Therapy:  April 8th, 2023 Labs - Once weekly:  CBC/D and BMP, Labs - Every other week:  ESR and CRP Method of administration: IV Push Method of administration may be changed at the discretion of home infusion pharmacist based upon assessment of the patient and/or caregiver's ability to self-administer the medication ordered.   Continuous Blood Gluc Sensor (FREESTYLE LIBRE  14 DAY SENSOR) MISC SMARTSIG:Topical Every 10 Days   diclofenac (VOLTAREN) 75 MG EC tablet TAKE 1 TABLET BY MOUTH 2 TIMES DAILY BETWEEN MEALS AS NEEDED.   escitalopram (LEXAPRO) 10 MG tablet Take 10 mg by mouth daily.   fluconazole (DIFLUCAN) 100 MG tablet Take 1 tablet (100 mg total) by mouth daily. (Patient taking differently: Take 100 mg by mouth daily as needed (yeast).)   fluorouracil (EFUDEX) 5 % cream Apply 1 application topically 2 (two) times daily as needed (cancer spots).   insulin aspart (NOVOLOG) 100 UNIT/ML injection Inject 10-20 Units into the skin 3 (three) times daily with meals. Per sliding scale   losartan-hydrochlorothiazide (HYZAAR) 50-12.5 MG tablet TAKE 1 TABLET BY MOUTH EVERY DAY   Omega-3 Fatty Acids (FISH OIL MAXIMUM STRENGTH) 1200 MG CAPS Take 1,200 mg by mouth daily.   ondansetron (ZOFRAN) 4 MG tablet Take 1 tablet (4 mg total) by mouth every 8 (eight) hours as needed for nausea or vomiting.   oxyCODONE (OXY IR/ROXICODONE) 5 MG immediate release tablet Take 1-2 tablets (5-10 mg total) by mouth every 6 (six) hours as needed for moderate pain (pain score 4-6).   rosuvastatin (CRESTOR) 20 MG tablet TAKE 1 TABLET BY MOUTH EVERY DAY (Patient taking differently: Take 20 mg by mouth daily.)   tiZANidine (ZANAFLEX) 4 MG tablet Take 1 tablet (4 mg total) by mouth every 8 (  eight) hours as needed for muscle spasms.   TRESIBA FLEXTOUCH 100 UNIT/ML SOPN FlexTouch Pen Inject 80 Units into the skin daily.   No facility-administered encounter medications on file as of 05/26/2021.     Patient Active Problem List   Diagnosis Date Noted   Vaccine counseling 05/04/2021   Skin lesion 01/01/2021   Type 2 diabetes mellitus with other specified complication ([place]) 51/70/0174   Nausea and vomiting 09/11/2020   MSSA (methicillin susceptible Staphylococcus aureus) infection 09/03/2020   Effusion, left knee 08/14/2020   Infection of total left knee replacement ([place]) 08/14/2020   Status post  total left knee replacement 03/21/2019   S/P carpal tunnel release right 02/22/19 03/08/2019   Carpal tunnel syndrome of right wrist    HTN (hypertension) 10/27/2018   Prostate cancer ([place]) 05/30/2017   Malignant neoplasm of prostate ([place]) 04/28/2017   Chronic pain of both knees 02/21/2017   Unilateral primary osteoarthritis, left knee 02/21/2017   Unilateral primary osteoarthritis, right knee 02/21/2017     Health Maintenance Due  Topic Date Due   COVID-19 Vaccine (1) Never done   FOOT EXAM  Never done   OPHTHALMOLOGY EXAM  Never done   HIV Screening  Never done   Hepatitis C Screening  Never done   Zoster Vaccines- Shingrix (1 of 2) Never done   Pneumonia Vaccine 92+ Years old (1 - PCV) Never done     Review of Systems See above. Otherwise 12 point ros is negative Physical Exam  BP 138/78    Pulse 70    Temp 98.8 F (37.1 C) (Oral)    Ht '5\' 8"'  (1.727 m) Comment: stated   Wt 195 lb (88.5 kg) Comment: stated   SpO2 97%    BMI 29.65 kg/m  Physical Exam  Constitutional: He is oriented to person, place, and time. He appears well-developed and well-nourished. No distress.  HENT:  Mouth/Throat: Oropharynx is clear and moist. No oropharyngeal exudate. No rash Ext: picc line is c/d/I. Left knee swelling. Post op bandage in place. No oozing. Slight warmth to touch to knee Lymphadenopathy:  He has no cervical adenopathy. +left leg with +1 edema Neurological: He is alert and oriented to person, place, and time.  Skin: Skin is warm and dry. No rash noted. No erythema. No petechial rash Psychiatric: He has a normal mood and affect. His behavior is normal.   CBC Lab Results  Component Value Date   WBC 7.7 05/16/2021   RBC 4.05 (L) 05/16/2021   HGB 11.9 (L) 05/16/2021   HCT 35.0 (L) 05/16/2021   PLT 185 05/16/2021   MCV 86.4 05/16/2021   MCH 29.4 05/16/2021   MCHC 34.0 05/16/2021   RDW 12.6 05/16/2021   LYMPHSABS 1,302 04/14/2021   MONOABS 0.4 02/20/2019   EOSABS 242 04/14/2021     BMET Lab Results  Component Value Date   NA 133 (L) 05/16/2021   K 3.8 05/16/2021   CL 100 05/16/2021   CO2 27 05/16/2021   GLUCOSE 159 (H) 05/16/2021   BUN 15 05/16/2021   CREATININE 0.93 05/16/2021   CALCIUM 8.2 (L) 05/16/2021   GFRNONAA >60 05/16/2021   GFRAA >60 03/22/2019   Rare MRSA on OR cx from 2/24 Methicillin resistant staphylococcus aureus      MIC    CIPROFLOXACIN <=0.5 SENSI... Sensitive    CLINDAMYCIN <=0.25 SENS... Sensitive    ERYTHROMYCIN <=0.25 SENS... Sensitive    GENTAMICIN <=0.5 SENSI... Sensitive    Inducible Clindamycin NEGATIVE  Sensitive  OXACILLIN >=4 RESISTANT  Resistant    RIFAMPIN <=0.5 SENSI... Sensitive    TETRACYCLINE <=1 SENSITIVE  Sensitive    TRIMETH/SULFA <=10 SENSIT... Sensitive    VANCOMYCIN <=0.5 SENSI... Sensitive     Assessment and Plan Pancytopenia on labwork from 3/6 after 4 days of vancomycin and 6 days of cefazolin. Unclear which antibiotic is the offending agent. Also lab work has such stark change that it needs to be re-checked to see if accurate as well as if patient needs blood tsf. Concern for drug induced bone marrow suppression(2/2 abtx). Physical exam doesn't suggest any signs of spontaneous bleeding as you would see with thrombocytopenia.   - will need to do stat cbc with diff and cmp - will switch to daptomycin 48m/kg/day -will change orders with home health - plan to repeat cbc and cmp on 3/9 and review with patient on 3/10 - will let dr bNinfa Lindenknow about update  --------------------------- Diagnosis:PJI of left knee  Culture Result: MRSA  No Known Allergies  OPAT Orders Discharge antibiotics to be given via PICC line Discharge antibiotics: Per pharmacy protocol- daptomycin 872mkg/day  Duration: 6 wk End Date: April 10th, 2023  PICochituateer Protocol:  Home health RN for IV administration and teaching; PICC line care and labs.    Labs weekly while on IV antibiotics: __x CBC with  differential __x BMP  _x_ CRP _x_ ESR __ Vancomycin trough _x_ CK weekly  _x_ Please pull PIC at completion of IV antibiotics __ Please leave PIC in place until doctor has seen patient or been notified  Fax weekly labs to ([phone] Follow Up Appt:  In 4 wk  @ [facility]

## 2021-05-27 LAB — CK: Total CK: 57 U/L (ref 44–196)

## 2021-05-28 ENCOUNTER — Ambulatory Visit (INDEPENDENT_AMBULATORY_CARE_PROVIDER_SITE_OTHER): Payer: HMO | Admitting: Orthopaedic Surgery

## 2021-05-28 ENCOUNTER — Encounter: Payer: Self-pay | Admitting: Orthopaedic Surgery

## 2021-05-28 ENCOUNTER — Other Ambulatory Visit: Payer: Self-pay

## 2021-05-28 DIAGNOSIS — T8454XD Infection and inflammatory reaction due to internal left knee prosthesis, subsequent encounter: Secondary | ICD-10-CM

## 2021-05-28 NOTE — Progress Notes (Signed)
The patient is now 2 weeks status post excision arthroplasty of an infected left total knee arthroplasty.  His original infection was a staph sensitive infection.  His most recent cultures unfortunately showed MRSA.  He has been to infectious disease clinic for follow-up he was on vancomycin and Ancef but had pancytopenia noted so he was switched to a different IV antibiotic.  He says he is doing well right now from the standpoint of his surgery.  He does have lymphedema and wants to get back into using his equipment to help with lymphedema which I am fine with. ? ?We have remove the staples in place Steri-Strips for his left operative knee.  There is swelling to be expected but no redness. ? ?I did let him know that he can work on flexion and extension of his left knee and can put weight on it but no high impact aerobic activities.  He will continue IV antibiotics per the infectious disease service.  I will see him back in 4 weeks to see how he is doing overall.  At some point he will need an antibiotic holiday and repeat aspiration and labs such as CRP and sed rate drawn.  However, it is early for that.  In 4 weeks from now I would like an AP and lateral of his left knee. ?

## 2021-05-29 ENCOUNTER — Telehealth: Payer: Self-pay

## 2021-05-29 ENCOUNTER — Other Ambulatory Visit: Payer: Self-pay

## 2021-05-29 ENCOUNTER — Other Ambulatory Visit (HOSPITAL_COMMUNITY): Payer: Self-pay

## 2021-05-29 ENCOUNTER — Ambulatory Visit (INDEPENDENT_AMBULATORY_CARE_PROVIDER_SITE_OTHER): Payer: HMO | Admitting: Internal Medicine

## 2021-05-29 ENCOUNTER — Ambulatory Visit (INDEPENDENT_AMBULATORY_CARE_PROVIDER_SITE_OTHER): Payer: HMO

## 2021-05-29 DIAGNOSIS — T8454XD Infection and inflammatory reaction due to internal left knee prosthesis, subsequent encounter: Secondary | ICD-10-CM

## 2021-05-29 DIAGNOSIS — T8454XA Infection and inflammatory reaction due to internal left knee prosthesis, initial encounter: Secondary | ICD-10-CM | POA: Diagnosis not present

## 2021-05-29 DIAGNOSIS — T82898A Other specified complication of vascular prosthetic devices, implants and grafts, initial encounter: Secondary | ICD-10-CM

## 2021-05-29 MED ORDER — ALTEPLASE 2 MG IJ SOLR
2.0000 mg | Freq: Once | INTRAMUSCULAR | Status: AC
  Administered 2021-05-29: 2 mg

## 2021-05-29 NOTE — Progress Notes (Signed)
Virtual Visit via Telephone Note ? ?I connected with [name] on 05/29/21 at  9:30 AM EST by telephone and verified that I am speaking with the correct person using two identifiers. ? ?Location: ?Patient: at home ?Provider: in clinic ?  ?I discussed the limitations, risks, security and privacy concerns of performing an evaluation and management service by telephone and the availability of in person appointments. I also discussed with the patient that there may be a patient responsible charge related to this service. The patient expressed understanding and agreed to proceed. ? ? ?History of Present Illness: ?Had to come to clinic to figure out laboma. Repeat labs are WNL. Patient mentioned that his iv abtx were sugglish going into his picc line today ? ?No fever, chills, nightsweats. Good visit with dr Ninfa Linden ?  ?Observations/Objective: ?Fluid speech ? ?Assessment and Plan: ? ?MRSA pji of left knee = continue on daptomycin. Will plan to arrange for cathflow infusion to clear out his picc line for better flow. ?Follow Up Instructions: ? ? see back at end of abtx infusion ? ?I discussed the assessment and treatment plan with the patient. The patient was provided an opportunity to ask questions and all were answered. The patient agreed with the plan and demonstrated an understanding of the instructions. ?  ?The patient was advised to call back or seek an in-person evaluation if the symptoms worsen or if the condition fails to improve as anticipated. ? ?I provided 26mnutes of non-face-to-face time during this encounter. ? ? ?CCarlyle Basques MD  ?

## 2021-05-29 NOTE — Telephone Encounter (Signed)
Per Dr. Baxter Flattery patient will cath flo setup. Is this something pharmacy team can put orders in for. Let me know how I can assist.  ?Nitesh Pitstick T Josh Nicolosi ? ?

## 2021-05-29 NOTE — Progress Notes (Signed)
Cathflo Activas '2mg'$  instilled into the occluded PICC line catheter per order. After 30 minutes of dwell time able to  aspirate good blood return and flush with N/S. ?

## 2021-05-29 NOTE — Telephone Encounter (Signed)
Thank you [name]!

## 2021-05-29 NOTE — Telephone Encounter (Signed)
Richardson Landry placed orders for cathflo and will reach out to patient for scheduling today. Thank you - Estill Bamberg

## 2021-06-02 ENCOUNTER — Telehealth: Payer: Self-pay | Admitting: Orthopaedic Surgery

## 2021-06-02 NOTE — Telephone Encounter (Signed)
Patient's wife called. Says the airline needs a note stating he is not able to fly so she can get her money back for his airplane ticket for a trip they had planned. Her call back number is [phone] ?

## 2021-06-02 NOTE — Telephone Encounter (Signed)
Letter completed and sent to pt's mychart. I called and advised but wife asked it to be mailed. Placed letter in mail  ?

## 2021-06-05 ENCOUNTER — Other Ambulatory Visit: Payer: Self-pay | Admitting: Surgical

## 2021-06-05 ENCOUNTER — Telehealth: Payer: Self-pay | Admitting: Orthopaedic Surgery

## 2021-06-05 MED ORDER — OXYCODONE HCL 5 MG PO TABS: 5.0000 mg | ORAL_TABLET | Freq: Three times a day (TID) | ORAL | 0 refills | Status: DC | PRN

## 2021-06-05 NOTE — Telephone Encounter (Signed)
Sent in RX

## 2021-06-05 NOTE — Telephone Encounter (Signed)
Can you advise on this please? °

## 2021-06-05 NOTE — Telephone Encounter (Signed)
Refill oxycodone

## 2021-06-09 ENCOUNTER — Ambulatory Visit (INDEPENDENT_AMBULATORY_CARE_PROVIDER_SITE_OTHER): Payer: HMO | Admitting: Internal Medicine

## 2021-06-09 ENCOUNTER — Other Ambulatory Visit: Payer: Self-pay

## 2021-06-09 ENCOUNTER — Ambulatory Visit: Payer: HMO

## 2021-06-09 ENCOUNTER — Encounter: Payer: Self-pay | Admitting: Internal Medicine

## 2021-06-09 ENCOUNTER — Telehealth: Payer: Self-pay

## 2021-06-09 VITALS — BP 116/67 | HR 73 | Temp 97.6°F | Wt 196.0 lb

## 2021-06-09 DIAGNOSIS — Z452 Encounter for adjustment and management of vascular access device: Secondary | ICD-10-CM | POA: Diagnosis not present

## 2021-06-09 DIAGNOSIS — T8454XD Infection and inflammatory reaction due to internal left knee prosthesis, subsequent encounter: Secondary | ICD-10-CM | POA: Diagnosis not present

## 2021-06-09 NOTE — Progress Notes (Signed)
? ?  Subjective:  ? ? Patient ID: [name], male    DOB: 08-22-1955, 66 y.o.   MRN: [id number] ? ?HPI ?He is here for a work in visit for issues with his picc line. ?On Sunday he noted some drainage around his picc line that was dark, like dried blood and his home health called with concerns.  His picc insertion had been angled at that time.  This was changed today by the home health nurse and he has not had any further drainage.  No fever, no chills.  Did get CathFlo recently and working well now.  ? ? ?Review of Systems  ?Constitutional:  Negative for chills and fever.  ?Gastrointestinal:  Negative for diarrhea.  ?Skin:  Negative for rash.  ? ?   ?Objective:  ? Physical Exam ?Musculoskeletal:  ?   Comments: Right arm picc site clean, dry, no drainage, some minimal erythema at the insertion site.  No induration or firmness noted  ? ? ? ? ? ?   ?Assessment & Plan:  ? ? ?

## 2021-06-09 NOTE — Assessment & Plan Note (Signed)
picc line evaluated and looks good now.  No current drainage.  Has not had any pus coming out.  Minimal erythema.  No concerns now and I told him he can continue to use the picc line as planned.  No indication for blood cultures or picc line removal.   ?He has follow up already next week.   ?

## 2021-06-09 NOTE — Telephone Encounter (Signed)
Cadence, RN with [facility] home [facility] called to report patient PICC line looks infected. Right inner arm tender to touch, 1.2 cm area of redness around picc line, brown drainage with odor. Dressing was last changed on 3/13. Patient stated after dressing change PICC line feels and looks better. RN wanted to inform Dr.Snider. Patient missed last dressing change. Contacted another RN to reschedule dressing change but never heard back until Cadence went to patient home today. Please advise.  ? ? ?[name] P [name], CMA ? ?

## 2021-06-09 NOTE — Assessment & Plan Note (Signed)
Tolerating his antibiotics well, no concerns.  ?

## 2021-06-12 ENCOUNTER — Telehealth: Payer: Self-pay | Admitting: Orthopaedic Surgery

## 2021-06-12 NOTE — Telephone Encounter (Signed)
Pt's wife Diane called stating pt has developed a lump on his leg. They are asking for a call back. Please call pt at [phone] ?

## 2021-06-15 ENCOUNTER — Ambulatory Visit: Payer: HMO | Admitting: Internal Medicine

## 2021-06-15 ENCOUNTER — Telehealth: Payer: Self-pay

## 2021-06-15 ENCOUNTER — Encounter: Payer: Self-pay | Admitting: Internal Medicine

## 2021-06-15 ENCOUNTER — Other Ambulatory Visit: Payer: Self-pay

## 2021-06-15 VITALS — BP 121/73 | HR 66 | Temp 98.2°F | Ht 69.0 in | Wt 196.0 lb

## 2021-06-15 DIAGNOSIS — A4902 Methicillin resistant Staphylococcus aureus infection, unspecified site: Secondary | ICD-10-CM | POA: Diagnosis not present

## 2021-06-15 DIAGNOSIS — T8454XD Infection and inflammatory reaction due to internal left knee prosthesis, subsequent encounter: Secondary | ICD-10-CM

## 2021-06-15 DIAGNOSIS — Z452 Encounter for adjustment and management of vascular access device: Secondary | ICD-10-CM

## 2021-06-15 NOTE — Telephone Encounter (Signed)
Thank you :)

## 2021-06-15 NOTE — Telephone Encounter (Signed)
Called [facility] and spoke with pharmacist Amy. Communicated verbal order from Dr. Baxter Flattery to extend IV daptomycin through 07/02/21. ? ?Amy acknowledged orders and had no further questions. ? ?Binnie Kand, RN  ?

## 2021-06-15 NOTE — Progress Notes (Signed)
? ? ?Patient ID: [name], male   DOB: [birth year], 66 y.o.   MRN: [id number] ? ?HPI ?[name] is a 66 y.o. male with hx oa of left knee had TKA on 2/20 c/b MSSA PJI in 5/22 s/p DAIR- synovectomy, polyliner exchange treated with cefazolin with rifampin followed by cefodroxil however in the recent months started to have worsening swelling of knee, pain and steady increase of inflammatory marker. Decision was made for 2 staged revision with resection arthroplasty for removal of all components/ and implant of articulating abtx spacer on 05/15/21 He was initially discharged on cefazolin, however his cultures now showed MRSA. He was placed on vancomycin on 3/2 but then had abn labs concerning for BM suppression on 3/7. He was changed to daptomycin at that time. Labs appeared to be erroneous since once rechecked, they had normalized. He was doing well on daptomycin until 3/17 where he required cathflow. He was seen on 3/21 by my partner, dr comer, due to home health RN concern for picc line infection. Needs Iv abt through April 13th.  ? ?At the visit last week, they were concerned about blood under the gel patch that they thought was infection, and his picc line kinked at the insertion which caused some irritation. He is otherwise doing well.  ? ?Had a day of increased swelling to left leg(also has baseline lymphadema in that leg) after increase activity. It improved after keeping his leg raised. ?  ? ?Outpatient Encounter Medications as of 06/15/2021  ?Medication Sig  ? aspirin 81 MG chewable tablet Chew 1 tablet (81 mg total) by mouth 2 (two) times daily.  ? Continuous Blood Gluc Sensor (FREESTYLE LIBRE 14 DAY SENSOR) MISC SMARTSIG:Topical Every 10 Days  ? diclofenac (VOLTAREN) 75 MG EC tablet TAKE 1 TABLET BY MOUTH 2 TIMES DAILY BETWEEN MEALS AS NEEDED.  ? escitalopram (LEXAPRO) 10 MG tablet Take 10 mg by mouth daily.  ? fluconazole (DIFLUCAN) 100 MG tablet Take 1 tablet (100 mg total) by mouth daily.  (Patient taking differently: Take 100 mg by mouth daily as needed (yeast).)  ? insulin aspart (NOVOLOG) 100 UNIT/ML injection Inject 10-20 Units into the skin 3 (three) times daily with meals. Per sliding scale  ? losartan-hydrochlorothiazide (HYZAAR) 50-12.5 MG tablet TAKE 1 TABLET BY MOUTH EVERY DAY  ? Omega-3 Fatty Acids (FISH OIL MAXIMUM STRENGTH) 1200 MG CAPS Take 1,200 mg by mouth daily.  ? ondansetron (ZOFRAN) 4 MG tablet Take 1 tablet (4 mg total) by mouth every 8 (eight) hours as needed for nausea or vomiting.  ? oxyCODONE (OXY IR/ROXICODONE) 5 MG immediate release tablet Take 1-2 tablets (5-10 mg total) by mouth every 8 (eight) hours as needed for moderate pain (pain score 4-6).  ? rosuvastatin (CRESTOR) 20 MG tablet TAKE 1 TABLET BY MOUTH EVERY DAY (Patient taking differently: Take 20 mg by mouth daily.)  ? tiZANidine (ZANAFLEX) 4 MG tablet Take 1 tablet (4 mg total) by mouth every 8 (eight) hours as needed for muscle spasms.  ? TRESIBA FLEXTOUCH 100 UNIT/ML SOPN FlexTouch Pen Inject 80 Units into the skin daily.  ? fluorouracil (EFUDEX) 5 % cream Apply 1 application topically 2 (two) times daily as needed (cancer spots). (Patient not taking: Reported on 06/15/2021)  ? ?No facility-administered encounter medications on file as of 06/15/2021.  ?  ? ?Patient Active Problem List  ? Diagnosis Date Noted  ? PICC (peripherally inserted central catheter) in place 06/09/2021  ? Vaccine counseling 05/04/2021  ? Skin lesion  01/01/2021  ? Type 2 diabetes mellitus with other specified complication ([place]) 29/93/7169  ? Nausea and vomiting 09/11/2020  ? MSSA (methicillin susceptible Staphylococcus aureus) infection 09/03/2020  ? Effusion, left knee 08/14/2020  ? Infection of total left knee replacement ([place]) 08/14/2020  ? Status post total left knee replacement 03/21/2019  ? S/P carpal tunnel release right 02/22/19 03/08/2019  ? Carpal tunnel syndrome of right wrist   ? HTN (hypertension) 10/27/2018  ? Prostate cancer  ([place]) 05/30/2017  ? Malignant neoplasm of prostate ([place]) 04/28/2017  ? Chronic pain of both knees 02/21/2017  ? Unilateral primary osteoarthritis, left knee 02/21/2017  ? Unilateral primary osteoarthritis, right knee 02/21/2017  ? ? ? ?Health Maintenance Due  ?Topic Date Due  ? COVID-19 Vaccine (1) Never done  ? FOOT EXAM  Never done  ? OPHTHALMOLOGY EXAM  Never done  ? HIV Screening  Never done  ? Hepatitis C Screening  Never done  ? Zoster Vaccines- Shingrix (1 of 2) Never done  ? Pneumonia Vaccine 36+ Years old (1 - PCV) Never done  ?  ? ?Review of Systems ?12 point ros is negative e ?Physical Exam  ? ?BP 121/73   Pulse 66   Temp 98.2 ?F (36.8 ?C) (Oral)   Ht '5\' 9"'$  (1.753 m)   Wt 196 lb (88.9 kg)   SpO2 97%   BMI 28.94 kg/m?   ?Physical Exam  ?Constitutional: He is oriented to person, place, and time. He appears well-developed and well-nourished. No distress.  ?HENT:  ?Mouth/Throat: Oropharynx is clear and moist. No oropharyngeal exudate.  ?Ext: picc line - is c/d/I. Small amount of redness ?Neurological: He is alert and oriented to person, place, and time.  ?Skin: Skin is warm and dry. No rash noted. No erythema.  ?Psychiatric: He has a normal mood and affect. His behavior is normal.  ? ? ?CBC ?Lab Results  ?Component Value Date  ? WBC 6.0 05/26/2021  ? RBC 3.98 (L) 05/26/2021  ? HGB 11.6 (L) 05/26/2021  ? HCT 35.2 (L) 05/26/2021  ? PLT 238 05/26/2021  ? MCV 88.4 05/26/2021  ? MCH 29.1 05/26/2021  ? MCHC 33.0 05/26/2021  ? RDW 13.1 05/26/2021  ? LYMPHSABS 1.1 05/26/2021  ? MONOABS 0.4 05/26/2021  ? EOSABS 0.3 05/26/2021  ? ? ?BMET ?Lab Results  ?Component Value Date  ? NA 136 05/26/2021  ? K 4.2 05/26/2021  ? CL 100 05/26/2021  ? CO2 28 05/26/2021  ? GLUCOSE 134 (H) 05/26/2021  ? BUN 18 05/26/2021  ? CREATININE 1.01 05/26/2021  ? CALCIUM 9.1 05/26/2021  ? GFRNONAA >60 05/26/2021  ? GFRAA >60 03/22/2019  ? ? ?Assessment and Plan ?Mrsa pji, 2 staged revision - ?- will plan on continuing with daptomycin until   April 13th - ?Will see him back then to decide if need further abtx. Ideally should be complete. Then to re-evaluate off of abtx to decide when new implant to be placed. ? ?Reassured him that the blood under the gel patch was not fungal infection ? ? ?

## 2021-06-17 LAB — FUNGUS CULTURE RESULT

## 2021-06-17 LAB — FUNGUS CULTURE WITH STAIN

## 2021-06-17 LAB — FUNGAL ORGANISM REFLEX

## 2021-06-25 ENCOUNTER — Ambulatory Visit (INDEPENDENT_AMBULATORY_CARE_PROVIDER_SITE_OTHER): Payer: HMO | Admitting: Orthopaedic Surgery

## 2021-06-25 ENCOUNTER — Encounter: Payer: Self-pay | Admitting: Orthopaedic Surgery

## 2021-06-25 DIAGNOSIS — T8454XD Infection and inflammatory reaction due to internal left knee prosthesis, subsequent encounter: Secondary | ICD-10-CM

## 2021-06-25 MED ORDER — OXYCODONE HCL 5 MG PO TABS: 5.0000 mg | ORAL_TABLET | Freq: Three times a day (TID) | ORAL | 0 refills | Status: DC | PRN

## 2021-06-25 NOTE — Progress Notes (Signed)
[name] comes in today getting close to 6 weeks after having an excision arthroplasty of an infected left total knee replacement.  Unfortunately did have a resistant infection.  He is followed closely by Dr. Carlyle Basques from infectious disease.  He is scheduled to be done with 6 weeks of antibiotics IV on April 13.  He will then see Dr. Graylon Good believe the next day.  He is ambulating with a crutch and occasional cane.  He does have lymphedema in that left leg and does use a lymphedema machine for this.  I have recommended he wear compressive hose as well. ? ?The knee is warm to be expected there is no redness.  I see a small area of swelling that I believe is more related to having an antibiotic implant with cement. ? ?Once he is off antibiotics for 2 weeks, we can repeat labs and a repeat aspiration of his knee to then determine if we are at a position to be able to perform his two-stage revision.  He understands that we are waiting for a clinical improvement before proceeding with further surgery.  When we see him back in 3 weeks we will determine what is the neck steps from there.  All questions and concerns were answered addressed.  He does need a refill of oxycodone. ?

## 2021-07-01 ENCOUNTER — Encounter: Payer: HMO | Admitting: Orthopaedic Surgery

## 2021-07-02 LAB — ACID FAST CULTURE WITH REFLEXED SENSITIVITIES (MYCOBACTERIA): Acid Fast Culture: NEGATIVE

## 2021-07-03 ENCOUNTER — Other Ambulatory Visit: Payer: Self-pay

## 2021-07-03 ENCOUNTER — Encounter: Payer: Self-pay | Admitting: Internal Medicine

## 2021-07-03 ENCOUNTER — Ambulatory Visit: Payer: HMO | Admitting: Internal Medicine

## 2021-07-03 VITALS — BP 147/81 | HR 62 | Temp 98.6°F | Wt 205.0 lb

## 2021-07-03 DIAGNOSIS — L03113 Cellulitis of right upper limb: Secondary | ICD-10-CM | POA: Diagnosis not present

## 2021-07-03 DIAGNOSIS — Z452 Encounter for adjustment and management of vascular access device: Secondary | ICD-10-CM | POA: Diagnosis not present

## 2021-07-03 MED ORDER — DOXYCYCLINE HYCLATE 100 MG PO TABS: 100.0000 mg | ORAL_TABLET | Freq: Two times a day (BID) | ORAL | 0 refills | Status: DC

## 2021-07-03 NOTE — Progress Notes (Signed)
PICC Removal    PICC length & location:  Right brachial 39 cm  Removed per verbal order from:  Dr. Baxter Flattery  Blood thinners:  aspirin 81 mg  Platelet count:  149 as of 06/30/21  Site assessment: Dressing clean and dry. Extremity warm and dry with palpable radial pulse. Insertion site with slight erythema and irritation with possible maceration. Dr. Baxter Flattery notified. No drainage present.   Pre-removal vital signs:  HR:  62 BP:  124/74 SpO2:  98% RA   Patient placed in flat, supine position. No sutures present. Insertion site cleaned with CHG, catheter removed and petroleum dressing applied. Tip intact. No fibrinous debris present on PICC line. Pressure held until hemostasis achieved.    Length of catheter removed:  39 cm    Education provided to patient:  Seek emergent care if you develop numbness or tingling, pain, redness, or swelling in the extremity. Seek emergent care if dressing becomes soaked with blood or if sharp pain, shortness of breath, chest pain, or neurological symptoms present.  No heavy lifting with affected arm for 48 hours, leave dressing in place for 24 hours. May shower once dressing is removed.  Notify the clinic if you develop a fever or if warmth, redness, tenderness, or drainage develop at the site.   Patient verbalized understanding and agreement, all questions answered. Patient tolerated procedure well and remained in clinic under the care of RN 30 minutes post removal in a flat position.   Post-observation vital signs:  HR:  61 BP:  129/72 SpO2:  99  Notified [facility] pharmacy team of removal.  Beryle Flock, RN

## 2021-07-03 NOTE — Progress Notes (Signed)
RFV: hospital follow up for mrsa pji s/p excisional arthroplasty with abtx spacer implant  Patient ID: [name], male   DOB: 1956/03/15, 66 y.o.   MRN: [id number]  HPI [name] had excision arthroplasty of an infected left total knee replacement. Cx grew MRSA surprisingly, since he had MSSA prior.  Left knee pain from activity but also suffers from lymphadema. Finished abtx on April 13th  Outpatient Encounter Medications as of 07/03/2021  Medication Sig   aspirin 81 MG chewable tablet Chew 1 tablet (81 mg total) by mouth 2 (two) times daily.   Continuous Blood Gluc Sensor (FREESTYLE LIBRE 14 DAY SENSOR) MISC SMARTSIG:Topical Every 10 Days   diclofenac (VOLTAREN) 75 MG EC tablet TAKE 1 TABLET BY MOUTH 2 TIMES DAILY BETWEEN MEALS AS NEEDED.   escitalopram (LEXAPRO) 10 MG tablet Take 10 mg by mouth daily.   fluconazole (DIFLUCAN) 100 MG tablet Take 1 tablet (100 mg total) by mouth daily. (Patient taking differently: Take 100 mg by mouth daily as needed (yeast).)   insulin aspart (NOVOLOG) 100 UNIT/ML injection Inject 10-20 Units into the skin 3 (three) times daily with meals. Per sliding scale   losartan-hydrochlorothiazide (HYZAAR) 50-12.5 MG tablet TAKE 1 TABLET BY MOUTH EVERY DAY   Omega-3 Fatty Acids (FISH OIL MAXIMUM STRENGTH) 1200 MG CAPS Take 1,200 mg by mouth daily.   ondansetron (ZOFRAN) 4 MG tablet Take 1 tablet (4 mg total) by mouth every 8 (eight) hours as needed for nausea or vomiting.   oxyCODONE (OXY IR/ROXICODONE) 5 MG immediate release tablet Take 1-2 tablets (5-10 mg total) by mouth every 8 (eight) hours as needed for moderate pain (pain score 4-6).   rosuvastatin (CRESTOR) 20 MG tablet TAKE 1 TABLET BY MOUTH EVERY DAY (Patient taking differently: Take 20 mg by mouth daily.)   tiZANidine (ZANAFLEX) 4 MG tablet Take 1 tablet (4 mg total) by mouth every 8 (eight) hours as needed for muscle spasms.   TRESIBA FLEXTOUCH 100 UNIT/ML SOPN FlexTouch Pen Inject 80 Units into  the skin daily.   fluorouracil (EFUDEX) 5 % cream Apply 1 application topically 2 (two) times daily as needed (cancer spots). (Patient not taking: Reported on 06/15/2021)   No facility-administered encounter medications on file as of 07/03/2021.     Patient Active Problem List   Diagnosis Date Noted   PICC (peripherally inserted central catheter) in place 06/09/2021   Vaccine counseling 05/04/2021   Skin lesion 01/01/2021   Type 2 diabetes mellitus with other specified complication ([place]) 77/82/4235   Nausea and vomiting 09/11/2020   MSSA (methicillin susceptible Staphylococcus aureus) infection 09/03/2020   Effusion, left knee 08/14/2020   Infection of total left knee replacement ([place]) 08/14/2020   Status post total left knee replacement 03/21/2019   S/P carpal tunnel release right 02/22/19 03/08/2019   Carpal tunnel syndrome of right wrist    HTN (hypertension) 10/27/2018   Prostate cancer ([place]) 05/30/2017   Malignant neoplasm of prostate ([place]) 04/28/2017   Chronic pain of both knees 02/21/2017   Unilateral primary osteoarthritis, left knee 02/21/2017   Unilateral primary osteoarthritis, right knee 02/21/2017     Health Maintenance Due  Topic Date Due   COVID-19 Vaccine (1) Never done   FOOT EXAM  Never done   OPHTHALMOLOGY EXAM  Never done   HIV Screening  Never done   Hepatitis C Screening  Never done   Zoster Vaccines- Shingrix (1 of 2) Never done   Pneumonia Vaccine 18+ Years old (1 - PCV) Never done  Review of Systems 12 point ros is negative except what is mentioned in hpi Physical Exam   BP (!) 147/81   Pulse 62   Temp 98.6 F (37 C) (Oral)   Wt 205 lb (93 kg)   SpO2 99%   BMI 30.27 kg/m   Gen = a xo by 3 in nad Skin= Macerated edge, slight erythema. Line was clean on. No drainage CBC Lab Results  Component Value Date   WBC 6.0 05/26/2021   RBC 3.98 (L) 05/26/2021   HGB 11.6 (L) 05/26/2021   HCT 35.2 (L) 05/26/2021   PLT 238 05/26/2021   MCV 88.4  05/26/2021   MCH 29.1 05/26/2021   MCHC 33.0 05/26/2021   RDW 13.1 05/26/2021   LYMPHSABS 1.1 05/26/2021   MONOABS 0.4 05/26/2021   EOSABS 0.3 05/26/2021    BMET Lab Results  Component Value Date   NA 136 05/26/2021   K 4.2 05/26/2021   CL 100 05/26/2021   CO2 28 05/26/2021   GLUCOSE 134 (H) 05/26/2021   BUN 18 05/26/2021   CREATININE 1.01 05/26/2021   CALCIUM 9.1 05/26/2021   GFRNONAA >60 05/26/2021   GFRAA >60 03/22/2019    Sed rate on 4/4 = 10 Crp on 4/4 = 4  Assessment and Plan  -will pull picc line - he sees dr Ninfa Linden in 2 wks for further evaluation and timing of new implant - if needs to be seen again in ID clinic, will ask dr. Ninfa Linden to reach out From our standpoint, he has completed 6 wk of treatment and no longer needs abtx at this time.  -did 5 days of doxycycline for possible surrounding cellulitis at picc line site

## 2021-07-16 ENCOUNTER — Ambulatory Visit (INDEPENDENT_AMBULATORY_CARE_PROVIDER_SITE_OTHER): Payer: HMO | Admitting: Orthopaedic Surgery

## 2021-07-16 ENCOUNTER — Encounter: Payer: Self-pay | Admitting: Orthopaedic Surgery

## 2021-07-16 DIAGNOSIS — T8454XD Infection and inflammatory reaction due to internal left knee prosthesis, subsequent encounter: Secondary | ICD-10-CM

## 2021-07-16 MED ORDER — OXYCODONE HCL 5 MG PO TABS: 5.0000 mg | ORAL_TABLET | Freq: Three times a day (TID) | ORAL | 0 refills | Status: DC | PRN

## 2021-07-16 NOTE — Progress Notes (Signed)
Mr. Kulzer has now been off antibiotics for a few weeks.  He has a history of an infected left total knee arthroplasty that we excised.  He has an antibiotic articulating spacer.  Unfortunately he is infection with a resistant infection.  He is feeling much better overall.  He is followed closely by Dr. Baxter Flattery from infectious disease. ? ?Today we will draw blood from him to obtain a CBC and a sed rate and a CRP.  I did pull some fluid off his knee and we will send this for Gram stain and culture.  Setting him up for the second stage revision surgery will depend on what the labs and culture show.  We will be in touch.  All question concerns were answered and addressed.  I will send in some more oxycodone for pain. ?

## 2021-07-16 NOTE — Addendum Note (Signed)
Addended by: Jacklyn Shell on: 07/16/2021 02:19 PM ? ? Modules accepted: Orders ? ?

## 2021-07-17 LAB — CBC WITH DIFFERENTIAL/PLATELET
Absolute Monocytes: 445 cells/uL (ref 200–950)
Basophils Absolute: 23 cells/uL (ref 0–200)
Basophils Relative: 0.4 %
Eosinophils Absolute: 331 cells/uL (ref 15–500)
Eosinophils Relative: 5.8 %
HCT: 41.1 % (ref 38.5–50.0)
Hemoglobin: 13.6 g/dL (ref 13.2–17.1)
Lymphs Abs: 1727 cells/uL (ref 850–3900)
MCH: 29.5 pg (ref 27.0–33.0)
MCHC: 33.1 g/dL (ref 32.0–36.0)
MCV: 89.2 fL (ref 80.0–100.0)
MPV: 10.3 fL (ref 7.5–12.5)
Monocytes Relative: 7.8 %
Neutro Abs: 3175 cells/uL (ref 1500–7800)
Neutrophils Relative %: 55.7 %
Platelets: 171 10*3/uL (ref 140–400)
RBC: 4.61 10*6/uL (ref 4.20–5.80)
RDW: 14.2 % (ref 11.0–15.0)
Total Lymphocyte: 30.3 %
WBC: 5.7 10*3/uL (ref 3.8–10.8)

## 2021-07-17 LAB — C-REACTIVE PROTEIN: CRP: 3.5 mg/L (ref <8.0)

## 2021-07-17 LAB — GRAM STAIN
MICRO NUMBER:: 13320963
SPECIMEN QUALITY:: ADEQUATE

## 2021-07-17 LAB — SEDIMENTATION RATE: Sed Rate: 6 mm/h (ref 0–20)

## 2021-07-22 LAB — BODY FLUID CULTURE

## 2021-07-30 ENCOUNTER — Telehealth: Payer: Self-pay | Admitting: Orthopaedic Surgery

## 2021-07-30 NOTE — Telephone Encounter (Signed)
Patient would like a refill on Oxycodone being he does a lot on his leg in the afternoon and it is hurting more than he usually would during the day.  ?

## 2021-07-31 ENCOUNTER — Other Ambulatory Visit: Payer: Self-pay | Admitting: Orthopaedic Surgery

## 2021-07-31 MED ORDER — OXYCODONE HCL 5 MG PO TABS: 5.0000 mg | ORAL_TABLET | Freq: Three times a day (TID) | ORAL | 0 refills | Status: DC | PRN

## 2021-08-04 ENCOUNTER — Other Ambulatory Visit: Payer: Self-pay

## 2021-08-05 NOTE — Progress Notes (Addendum)
Anesthesia Review:  PCP: DR Avva with [facility]  LOV 08/06/21- have called and requested note  LOV 08/06/21 on chart.  Cardiologist : none  Chest x-ray : EKG : 05/17/21  Echo :2020  Stress test:2020  Cardiac Cath :  Activity level: can do a flight of stairs without difficulty  Sleep Study/ CPAP : Fasting Blood Sugar :      / Checks Blood Sugar -- times a day:   Blood Thinner/ Instructions /Last Dose: ASA / Instructions/ Last Dose :   81 mg Aspirin  DM- type  2  Hgba1c-  done 08/06/21- have called and requested they are to fax - 7.1 on chart  Uses [facility]  Have called and requested hgba1c for the third time on 08/11/21.

## 2021-08-05 NOTE — Progress Notes (Signed)
DUE TO COVID-19 ONLY  2  VISITOR IS ALLOWED TO COME WITH YOU AND STAY IN THE WAITING ROOM ONLY DURING PRE OP AND PROCEDURE DAY OF SURGERY.   4  VISITOR  MAY VISIT WITH YOU AFTER SURGERY IN YOUR PRIVATE ROOM DURING VISITING HOURS ONLY! ?YOU MAY HAVE ONE PERSON SPEND THE NITE WITH YOU IN YOUR ROOM AFTER SURGERY.   ? ?  ? ? Your procedure is scheduled on:  ?        08/14/21  ? Report to [facility] Main  Entrance ? ? Report to admitting at       1015           AM ?DO NOT BRING INSURANCE CARD, PICTURE ID OR WALLET DAY OF SURGERY.  ?  ? ? Call this number if you have problems the morning of surgery [phone]  ? ? REMEMBER: NO  SOLID FOODS , CANDY, GUM OR MINTS AFTER MIDNITE THE NITE BEFORE SURGERY .       Marland Kitchen CLEAR LIQUIDS UNTIL        1000am         DAY OF SURGERY.      PLEASE FINISH g2 lower sugar  DRINK PER SURGEON ORDER  WHICH NEEDS TO BE COMPLETED AT    1000 am       MORNING OF SURGERY.   ? ? ? ? ?CLEAR LIQUID DIET ? ? ?Foods Allowed      ?WATER ?BLACK COFFEE ( SUGAR OK, NO MILK, CREAM OR CREAMER) REGULAR AND DECAF  ?TEA ( SUGAR OK NO MILK, CREAM, OR CREAMER) REGULAR AND DECAF  ?PLAIN JELLO ( NO RED)  ?FRUIT ICES ( NO RED, NO FRUIT PULP)  ?POPSICLES ( NO RED)  ?JUICE- APPLE, WHITE GRAPE AND WHITE CRANBERRY  ?SPORT DRINK LIKE GATORADE ( NO RED)  ?CLEAR BROTH ( VEGETABLE , CHICKEN OR BEEF)                                                               ? ?    ? ?BRUSH YOUR TEETH MORNING OF SURGERY AND RINSE YOUR MOUTH OUT, NO CHEWING GUM CANDY OR MINTS. ?  ? ? Take these medicines the morning of surgery with A SIP OF WATER:  lexapro  ? ? ?DO NOT TAKE ANY DIABETIC MEDICATIONS DAY OF YOUR SURGERY ?                  ?            You may not have any metal on your body including hair pins and  ?            piercings  Do not wear jewelry, make-up, lotions, powders or perfumes, deodorant ?            Do not wear nail polish on your fingernails.   ?           IF YOU ARE A MALE AND WANT TO SHAVE UNDER ARMS OR LEGS  PRIOR TO SURGERY YOU MUST DO SO AT LEAST 48 HOURS PRIOR TO SURGERY.  ?            Men may shave face and neck. ? ? Do not bring valuables to the hospital. [facility]  NOT ?            RESPONSIBLE   FOR VALUABLES. ? Contacts, dentures or bridgework may not be worn into surgery. ? Leave suitcase in the car. After surgery it may be brought to your room. ? ?  ? Patients discharged the day of surgery will not be allowed to drive home. IF YOU ARE HAVING SURGERY AND GOING HOME THE SAME DAY, YOU MUST HAVE AN ADULT TO DRIVE YOU HOME AND BE WITH YOU FOR 24 HOURS. YOU MAY GO HOME BY TAXI OR UBER OR ORTHERWISE, BUT AN ADULT MUST ACCOMPANY YOU HOME AND STAY WITH YOU FOR 24 HOURS. ?  ? ?            Please read over the following fact sheets you were given: ?_____________________________________________________________________ ? ? - Preparing for Surgery ?Before surgery, you can play an important role.  Because skin is not sterile, your skin needs to be as free of germs as possible.  You can reduce the number of germs on your skin by washing with CHG (chlorahexidine gluconate) soap before surgery.  CHG is an antiseptic cleaner which kills germs and bonds with the skin to continue killing germs even after washing. ?Please DO NOT use if you have an allergy to CHG or antibacterial soaps.  If your skin becomes reddened/irritated stop using the CHG and inform your nurse when you arrive at Short Stay. ?Do not shave (including legs and underarms) for at least 48 hours prior to the first CHG shower.  You may shave your face/neck. ?Please follow these instructions carefully: ? 1.  Shower with CHG Soap the night before surgery and the  morning of Surgery. ? 2.  If you choose to wash your hair, wash your hair first as usual with your  normal  shampoo. ? 3.  After you shampoo, rinse your hair and body thoroughly to remove the  shampoo.                           4.  Use CHG as you would any other liquid soap.  You can apply chg  directly  to the skin and wash  ?                     Gently with a scrungie or clean washcloth. ? 5.  Apply the CHG Soap to your body ONLY FROM THE NECK DOWN.   Do not use on face/ open      ?                     Wound or open sores. Avoid contact with eyes, ears mouth and genitals (private parts).  ?                     Production manager,  Genitals (private parts) with your normal soap. ?            6.  Wash thoroughly, paying special attention to the area where your surgery  will be performed. ? 7.  Thoroughly rinse your body with warm water from the neck down. ? 8.  DO NOT shower/wash with your normal soap after using and rinsing off  the CHG Soap. ?               9.  Pat yourself dry with a clean towel. ?  10.  Wear clean pajamas. ?           11.  Place clean sheets on your bed the night of your first shower and do not  sleep with pets. ?Day of Surgery : ?Do not apply any lotions/deodorants the morning of surgery.  Please wear clean clothes to the hospital/surgery center. ? ?FAILURE TO FOLLOW THESE INSTRUCTIONS MAY RESULT IN THE CANCELLATION OF YOUR SURGERY ?PATIENT SIGNATURE_________________________________ ? ?NURSE SIGNATURE__________________________________ ? ?________________________________________________________________________  ? ? ?           ?

## 2021-08-07 ENCOUNTER — Other Ambulatory Visit: Payer: Self-pay

## 2021-08-07 ENCOUNTER — Encounter (HOSPITAL_COMMUNITY): Payer: Self-pay

## 2021-08-07 ENCOUNTER — Encounter (HOSPITAL_COMMUNITY)
Admission: RE | Admit: 2021-08-07 | Discharge: 2021-08-07 | Disposition: A | Discharge status: Home / Self Care | Payer: HMO | Source: Ambulatory Visit | Attending: Orthopaedic Surgery | Admitting: Orthopaedic Surgery

## 2021-08-07 VITALS — BP 151/86 | HR 62 | Temp 98.1°F | Resp 16 | Ht 69.0 in | Wt 202.0 lb

## 2021-08-07 DIAGNOSIS — E1169 Type 2 diabetes mellitus with other specified complication: Secondary | ICD-10-CM | POA: Insufficient documentation

## 2021-08-07 DIAGNOSIS — Z01812 Encounter for preprocedural laboratory examination: Secondary | ICD-10-CM | POA: Diagnosis not present

## 2021-08-07 DIAGNOSIS — Z01818 Encounter for other preprocedural examination: Secondary | ICD-10-CM

## 2021-08-07 LAB — GLUCOSE, CAPILLARY: Glucose-Capillary: 161 mg/dL — ABNORMAL HIGH (ref 70–99)

## 2021-08-07 LAB — CBC
HCT: 40.2 % (ref 39.0–52.0)
Hemoglobin: 14.2 g/dL (ref 13.0–17.0)
MCH: 30 pg (ref 26.0–34.0)
MCHC: 35.3 g/dL (ref 30.0–36.0)
MCV: 85 fL (ref 80.0–100.0)
Platelets: 147 10*3/uL — ABNORMAL LOW (ref 150–400)
RBC: 4.73 MIL/uL (ref 4.22–5.81)
RDW: 13.1 % (ref 11.5–15.5)
WBC: 5.4 10*3/uL (ref 4.0–10.5)
nRBC: 0 % (ref 0.0–0.2)

## 2021-08-07 LAB — BASIC METABOLIC PANEL
Anion gap: 6 (ref 5–15)
BUN: 23 mg/dL (ref 8–23)
CO2: 29 mmol/L (ref 22–32)
Calcium: 9.6 mg/dL (ref 8.9–10.3)
Chloride: 106 mmol/L (ref 98–111)
Creatinine, Ser: 1 mg/dL (ref 0.61–1.24)
GFR, Estimated: >60 mL/min (ref >60)
Glucose, Bld: 170 mg/dL — ABNORMAL HIGH (ref 70–99)
Potassium: 4.5 mmol/L (ref 3.5–5.1)
Sodium: 141 mmol/L (ref 135–145)

## 2021-08-07 LAB — SURGICAL PCR SCREEN
MRSA, PCR: NEGATIVE
Staphylococcus aureus: NEGATIVE

## 2021-08-13 NOTE — H&P (Signed)
[name] is an 66 y.o. male.   Chief Complaint: Previous infected left total knee arthroplasty HPI: The patient is a 66 year old gentleman who underwent a successful primary total knee arthroplasty and December 2020.  In May 2022 he developed an infection in that left knee and underwent an open arthrotomy with synovectomy and polyliner exchange.  He had a sensitive staph infection and was treated with 6 weeks of IV antibiotics followed by oral antibiotics and was followed by the infectious disease service.  He then developed a recurrent infection and was taken to the operating room in February 2023 for an excision arthroplasty with removal of all components and placement of an articulating antibiotic cement spacer.  At that surgery he was found to have MRSA.  He has been on long-term IV antibiotics since then and then transition to oral antibiotics.  He was then off of antibiotics for several weeks and we aspirated the knee and found no organisms.  His acute phase reactant labs also normalized.  At this point he is presenting for revision arthroplasty with removal of antibiotic spacer, repeat irrigation debridement of the knee and this two-stage revision with placement of revision components.  Past Medical History:  Diagnosis Date   Arthritis    Diabetes mellitus without complication ([place]) 1660   Diabetes Type II   GERD (gastroesophageal reflux disease)    diet controlled   HTN (hypertension) 10/27/2018   Hyperlipidemia    Internal hemorrhoids 2014   noted on colonoscopy   MSSA (methicillin susceptible Staphylococcus aureus) infection 09/03/2020   Osteoarthritis of both knees    Prostate cancer ([place])    prostate   Sigmoid diverticulosis 2014   Mild, noted on colonoscopy   Skin lesion 01/01/2021   Type 2 diabetes mellitus with other specified complication ([place]) 63/03/6008   Vaccine counseling 05/04/2021    Past Surgical History:  Procedure Laterality Date   CARPAL TUNNEL RELEASE  Right 02/22/2019   Procedure: CARPAL TUNNEL RELEASE;  Surgeon: Carole Civil, MD;  Location: [facility];  Service: Orthopedics;  Laterality: Right;   COLONOSCOPY N/A 09/13/2012   Procedure: COLONOSCOPY;  Surgeon: Mark Binder, MD;  Location: [facility];  Service: Endoscopy;  Laterality: N/A;  9:30 AM   CYSTOSCOPY WITH URETHRAL DILATATION N/A 06/02/2020   Procedure: CYSTOSCOPY WITH URETHRAL DILATATION;  Surgeon: Mark Bring, MD;  Location: [facility];  Service: Urology;  Laterality: N/A;  ONLY NEEDS 30 MIN   EXCISIONAL TOTAL KNEE ARTHROPLASTY WITH ANTIBIOTIC SPACERS Left 05/15/2021   Procedure: LEFT TOTAL KNEE IRRIGATION AND DEBRIDEMENT, EXCISION LEFT TOTAL KNEE ARTHROPLASTY WITH ANTIBIOTIC SPACERS;  Surgeon: Mark Rossetti, MD;  Location: [facility];  Service: Orthopedics;  Laterality: Left;   HERNIA REPAIR     20 years ago   I & D KNEE WITH POLY EXCHANGE Left 08/14/2020   Procedure: IRRIGATION AND DEBRIDEMENT LEFT KNEE with synovectomy WITH POLY EXCHANGE;  Surgeon: Mark Rossetti, MD;  Location: [facility];  Service: Orthopedics;  Laterality: Left;   KNEE ARTHROSCOPY Left    15 years ago   LYMPHADENECTOMY Bilateral 05/30/2017   Procedure: LYMPHADENECTOMY, PELVIC EXTENDED;  Surgeon: Mark Bring, MD;  Location: [facility];  Service: Urology;  Laterality: Bilateral;   ROBOT ASSISTED LAPAROSCOPIC RADICAL PROSTATECTOMY N/A 05/30/2017   Procedure: XI ROBOTIC ASSISTED LAPAROSCOPIC RADICAL PROSTATECTOMY LEVEL 3;  Surgeon: Mark Bring, MD;  Location: [facility];  Service: Urology;  Laterality: N/A;   SHOULDER ARTHROSCOPY Right    15 years ago   TOTAL  KNEE ARTHROPLASTY Left 03/21/2019   Procedure: LEFT TOTAL KNEE ARTHROPLASTY;  Surgeon: Mark Rossetti, MD;  Location: [facility];  Service: Orthopedics;  Laterality: Left;    Family History  Problem Relation Age of Onset   Cancer Mother        small cell lung   Social History:  reports that he has never smoked. He has never used smokeless  tobacco. He reports that he does not drink alcohol and does not use drugs.  Allergies: No Known Allergies  No medications prior to admission.    No results found for this or any previous visit (from the past 48 hour(s)). No results found.  Review of Systems  All other systems reviewed and are negative.  There were no vitals taken for this visit. Physical Exam Vitals reviewed.  Constitutional:      Appearance: Normal appearance.  HENT:     Head: Normocephalic and atraumatic.  Eyes:     Extraocular Movements: Extraocular movements intact.     Pupils: Pupils are equal, round, and reactive to light.  Cardiovascular:     Rate and Rhythm: Normal rate and regular rhythm.  Pulmonary:     Effort: Pulmonary effort is normal.     Breath sounds: Normal breath sounds.  Abdominal:     Palpations: Abdomen is soft.  Musculoskeletal:     Cervical back: Normal range of motion and neck supple.     Right knee: Deformity present. Decreased range of motion.  Neurological:     Mental Status: He is alert and oriented to person, place, and time.  Psychiatric:        Behavior: Behavior normal.     Assessment/Plan Previous infected left total knee arthroplasty status post excision arthroplasty presenting for two-stage revision.  The patient understands that we will proceed to surgery for removal of the previous antibiotic spacer and hopefully performing a revision total knee arthroplasty.  A lot of this will depend on what we find in her operative assuring that hopefully no infection is still remaining within the knee.  We had a long and thorough discussion about surgery including the risk and benefits of surgery.  He does wish proceed at this standpoint and understands the treatment plan.  Mark Rossetti, MD 08/13/2021, 4:49 PM

## 2021-08-14 ENCOUNTER — Inpatient Hospital Stay (HOSPITAL_COMMUNITY)
Admission: RE | Admit: 2021-08-14 | Discharge: 2021-08-17 | DRG: 468 | Disposition: A | Discharge status: Home Health | Payer: HMO | Source: Ambulatory Visit | Attending: Orthopaedic Surgery | Admitting: Orthopaedic Surgery

## 2021-08-14 ENCOUNTER — Inpatient Hospital Stay (HOSPITAL_COMMUNITY): Payer: HMO | Admitting: Physician Assistant

## 2021-08-14 ENCOUNTER — Inpatient Hospital Stay (HOSPITAL_COMMUNITY): Payer: HMO | Admitting: Certified Registered Nurse Anesthetist

## 2021-08-14 ENCOUNTER — Encounter (HOSPITAL_COMMUNITY)
Admission: RE | Disposition: A | Discharge status: Home Health | Payer: Self-pay | Source: Ambulatory Visit | Attending: Orthopaedic Surgery

## 2021-08-14 ENCOUNTER — Other Ambulatory Visit: Payer: Self-pay

## 2021-08-14 ENCOUNTER — Encounter (HOSPITAL_COMMUNITY): Payer: Self-pay | Admitting: Orthopaedic Surgery

## 2021-08-14 ENCOUNTER — Inpatient Hospital Stay (HOSPITAL_COMMUNITY): Payer: HMO

## 2021-08-14 DIAGNOSIS — Z794 Long term (current) use of insulin: Secondary | ICD-10-CM

## 2021-08-14 DIAGNOSIS — Z96659 Presence of unspecified artificial knee joint: Secondary | ICD-10-CM | POA: Diagnosis present

## 2021-08-14 DIAGNOSIS — Z7984 Long term (current) use of oral hypoglycemic drugs: Secondary | ICD-10-CM

## 2021-08-14 DIAGNOSIS — E119 Type 2 diabetes mellitus without complications: Secondary | ICD-10-CM | POA: Diagnosis present

## 2021-08-14 DIAGNOSIS — M170 Bilateral primary osteoarthritis of knee: Secondary | ICD-10-CM | POA: Diagnosis present

## 2021-08-14 DIAGNOSIS — T8454XA Infection and inflammatory reaction due to internal left knee prosthesis, initial encounter: Secondary | ICD-10-CM

## 2021-08-14 DIAGNOSIS — Z6829 Body mass index (BMI) 29.0-29.9, adult: Secondary | ICD-10-CM

## 2021-08-14 DIAGNOSIS — Z96652 Presence of left artificial knee joint: Secondary | ICD-10-CM | POA: Diagnosis not present

## 2021-08-14 DIAGNOSIS — Z801 Family history of malignant neoplasm of trachea, bronchus and lung: Secondary | ICD-10-CM | POA: Diagnosis not present

## 2021-08-14 DIAGNOSIS — Y831 Surgical operation with implant of artificial internal device as the cause of abnormal reaction of the patient, or of later complication, without mention of misadventure at the time of the procedure: Secondary | ICD-10-CM | POA: Diagnosis present

## 2021-08-14 DIAGNOSIS — B9562 Methicillin resistant Staphylococcus aureus infection as the cause of diseases classified elsewhere: Secondary | ICD-10-CM | POA: Diagnosis present

## 2021-08-14 DIAGNOSIS — Z8546 Personal history of malignant neoplasm of prostate: Secondary | ICD-10-CM | POA: Diagnosis not present

## 2021-08-14 DIAGNOSIS — T8454XD Infection and inflammatory reaction due to internal left knee prosthesis, subsequent encounter: Secondary | ICD-10-CM

## 2021-08-14 DIAGNOSIS — E1169 Type 2 diabetes mellitus with other specified complication: Principal | ICD-10-CM

## 2021-08-14 DIAGNOSIS — E785 Hyperlipidemia, unspecified: Secondary | ICD-10-CM | POA: Diagnosis present

## 2021-08-14 DIAGNOSIS — Z01818 Encounter for other preprocedural examination: Secondary | ICD-10-CM

## 2021-08-14 DIAGNOSIS — I10 Essential (primary) hypertension: Secondary | ICD-10-CM

## 2021-08-14 DIAGNOSIS — E669 Obesity, unspecified: Secondary | ICD-10-CM | POA: Diagnosis present

## 2021-08-14 DIAGNOSIS — K219 Gastro-esophageal reflux disease without esophagitis: Secondary | ICD-10-CM | POA: Diagnosis present

## 2021-08-14 HISTORY — PX: TOTAL KNEE REVISION: SHX996

## 2021-08-14 LAB — GLUCOSE, CAPILLARY
Glucose-Capillary: 154 mg/dL — ABNORMAL HIGH (ref 70–99)
Glucose-Capillary: 185 mg/dL — ABNORMAL HIGH (ref 70–99)
Glucose-Capillary: 394 mg/dL — ABNORMAL HIGH (ref 70–99)

## 2021-08-14 SURGERY — TOTAL KNEE REVISION
Anesthesia: Spinal | Site: Knee | Laterality: Left

## 2021-08-14 MED ORDER — CHLORHEXIDINE GLUCONATE 0.12 % MT SOLN
15.0000 mL | Freq: Once | OROMUCOSAL | Status: AC
  Administered 2021-08-14: 15 mL via OROMUCOSAL

## 2021-08-14 MED ORDER — LOSARTAN POTASSIUM 50 MG PO TABS
50.0000 mg | ORAL_TABLET | Freq: Every day | ORAL | Status: DC
  Administered 2021-08-17: 50 mg via ORAL
  Filled 2021-08-14: qty 1

## 2021-08-14 MED ORDER — SODIUM CHLORIDE 0.9 % IV SOLN: INTRAVENOUS | Status: DC

## 2021-08-14 MED ORDER — CELECOXIB 200 MG PO CAPS
200.0000 mg | ORAL_CAPSULE | Freq: Once | ORAL | Status: AC
  Administered 2021-08-14: 200 mg via ORAL
  Filled 2021-08-14: qty 1

## 2021-08-14 MED ORDER — PROPOFOL 500 MG/50ML IV EMUL
INTRAVENOUS | Status: AC
  Filled 2021-08-14: qty 50

## 2021-08-14 MED ORDER — BUPIVACAINE-EPINEPHRINE (PF) 0.25% -1:200000 IJ SOLN
INTRAMUSCULAR | Status: AC
  Filled 2021-08-14: qty 30

## 2021-08-14 MED ORDER — HYDROMORPHONE HCL 1 MG/ML IJ SOLN
1.0000 mg | INTRAMUSCULAR | Status: DC | PRN
  Administered 2021-08-14 – 2021-08-15 (×3): 1 mg via INTRAVENOUS
  Filled 2021-08-14 (×3): qty 1

## 2021-08-14 MED ORDER — LOSARTAN POTASSIUM-HCTZ 50-12.5 MG PO TABS: 1.0000 | ORAL_TABLET | Freq: Every day | ORAL | Status: DC

## 2021-08-14 MED ORDER — OXYCODONE HCL 5 MG PO TABS
5.0000 mg | ORAL_TABLET | ORAL | Status: DC | PRN
  Administered 2021-08-15: 10 mg via ORAL
  Filled 2021-08-14: qty 2

## 2021-08-14 MED ORDER — APIXABAN 2.5 MG PO TABS
2.5000 mg | ORAL_TABLET | Freq: Two times a day (BID) | ORAL | Status: DC
  Administered 2021-08-15 – 2021-08-16 (×4): 2.5 mg via ORAL
  Filled 2021-08-14 (×5): qty 1

## 2021-08-14 MED ORDER — SODIUM CHLORIDE 0.9 % IR SOLN
Status: DC | PRN
Start: 2021-08-14 — End: 2021-08-14
  Administered 2021-08-14: 3000 mL
  Administered 2021-08-14: 1000 mL

## 2021-08-14 MED ORDER — MENTHOL 3 MG MT LOZG: 1.0000 | LOZENGE | OROMUCOSAL | Status: DC | PRN

## 2021-08-14 MED ORDER — TRANEXAMIC ACID-NACL 1000-0.7 MG/100ML-% IV SOLN
INTRAVENOUS | Status: AC
  Filled 2021-08-14: qty 100

## 2021-08-14 MED ORDER — ACETAMINOPHEN 500 MG PO TABS
1000.0000 mg | ORAL_TABLET | Freq: Once | ORAL | Status: AC
  Administered 2021-08-14: 1000 mg via ORAL
  Filled 2021-08-14: qty 2

## 2021-08-14 MED ORDER — INSULIN ASPART 100 UNIT/ML IJ SOLN
0.0000 [IU] | Freq: Three times a day (TID) | INTRAMUSCULAR | Status: DC
  Administered 2021-08-15: 5 [IU] via SUBCUTANEOUS
  Administered 2021-08-15: 8 [IU] via SUBCUTANEOUS
  Administered 2021-08-16 (×2): 3 [IU] via SUBCUTANEOUS

## 2021-08-14 MED ORDER — AMISULPRIDE (ANTIEMETIC) 5 MG/2ML IV SOLN
10.0000 mg | Freq: Once | INTRAVENOUS | Status: AC | PRN
  Administered 2021-08-14: 10 mg via INTRAVENOUS

## 2021-08-14 MED ORDER — OXYCODONE HCL 5 MG/5ML PO SOLN: 5.0000 mg | Freq: Once | ORAL | Status: AC | PRN

## 2021-08-14 MED ORDER — ORAL CARE MOUTH RINSE: 15.0000 mL | Freq: Once | OROMUCOSAL | Status: AC

## 2021-08-14 MED ORDER — PANTOPRAZOLE SODIUM 40 MG PO TBEC
40.0000 mg | DELAYED_RELEASE_TABLET | Freq: Every day | ORAL | Status: DC
  Administered 2021-08-14 – 2021-08-17 (×4): 40 mg via ORAL
  Filled 2021-08-14 (×4): qty 1

## 2021-08-14 MED ORDER — OXYCODONE HCL 5 MG PO TABS
5.0000 mg | ORAL_TABLET | Freq: Once | ORAL | Status: AC | PRN
  Administered 2021-08-14: 5 mg via ORAL

## 2021-08-14 MED ORDER — LACTATED RINGERS IV SOLN: INTRAVENOUS | Status: DC

## 2021-08-14 MED ORDER — PROPOFOL 1000 MG/100ML IV EMUL
INTRAVENOUS | Status: AC
  Filled 2021-08-14: qty 100

## 2021-08-14 MED ORDER — ESCITALOPRAM OXALATE 20 MG PO TABS
10.0000 mg | ORAL_TABLET | Freq: Every day | ORAL | Status: DC
  Administered 2021-08-14 – 2021-08-17 (×4): 10 mg via ORAL
  Filled 2021-08-14 (×4): qty 1

## 2021-08-14 MED ORDER — MIDAZOLAM HCL 2 MG/2ML IJ SOLN
1.0000 mg | INTRAMUSCULAR | Status: DC
  Administered 2021-08-14: 2 mg via INTRAVENOUS
  Filled 2021-08-14: qty 2

## 2021-08-14 MED ORDER — CLONIDINE HCL (ANALGESIA) 100 MCG/ML EP SOLN
EPIDURAL | Status: DC | PRN
  Administered 2021-08-14: 80 ug

## 2021-08-14 MED ORDER — ONDANSETRON HCL 4 MG PO TABS: 4.0000 mg | ORAL_TABLET | Freq: Four times a day (QID) | ORAL | Status: DC | PRN

## 2021-08-14 MED ORDER — POVIDONE-IODINE 10 % EX SWAB
2.0000 "application " | Freq: Once | CUTANEOUS | Status: AC
  Administered 2021-08-14: 2 via TOPICAL

## 2021-08-14 MED ORDER — CEFAZOLIN SODIUM-DEXTROSE 2-4 GM/100ML-% IV SOLN
2.0000 g | INTRAVENOUS | Status: AC
  Administered 2021-08-14: 2 g via INTRAVENOUS
  Filled 2021-08-14: qty 100

## 2021-08-14 MED ORDER — ONDANSETRON HCL 4 MG/2ML IJ SOLN
INTRAMUSCULAR | Status: DC | PRN
  Administered 2021-08-14: 4 mg via INTRAVENOUS

## 2021-08-14 MED ORDER — HYDROMORPHONE HCL 2 MG PO TABS: 2.0000 mg | ORAL_TABLET | ORAL | Status: DC | PRN

## 2021-08-14 MED ORDER — 0.9 % SODIUM CHLORIDE (POUR BTL) OPTIME
TOPICAL | Status: DC | PRN
Start: 2021-08-14 — End: 2021-08-14
  Administered 2021-08-14: 1000 mL

## 2021-08-14 MED ORDER — DOCUSATE SODIUM 100 MG PO CAPS
100.0000 mg | ORAL_CAPSULE | Freq: Two times a day (BID) | ORAL | Status: DC
  Administered 2021-08-14 – 2021-08-17 (×6): 100 mg via ORAL
  Filled 2021-08-14 (×6): qty 1

## 2021-08-14 MED ORDER — OXYCODONE HCL 5 MG PO TABS
ORAL_TABLET | ORAL | Status: AC
Start: 2021-08-14 — End: 2021-08-15
  Filled 2021-08-14: qty 1

## 2021-08-14 MED ORDER — HYDROMORPHONE HCL 1 MG/ML IJ SOLN
INTRAMUSCULAR | Status: AC
  Filled 2021-08-14: qty 1

## 2021-08-14 MED ORDER — AMISULPRIDE (ANTIEMETIC) 5 MG/2ML IV SOLN
INTRAVENOUS | Status: AC
  Filled 2021-08-14: qty 4

## 2021-08-14 MED ORDER — FENTANYL CITRATE PF 50 MCG/ML IJ SOSY
50.0000 ug | PREFILLED_SYRINGE | INTRAMUSCULAR | Status: DC
  Administered 2021-08-14: 50 ug via INTRAVENOUS
  Filled 2021-08-14: qty 2

## 2021-08-14 MED ORDER — INSULIN ASPART 100 UNIT/ML ~~LOC~~ SOLN: 10.0000 [IU] | Freq: Three times a day (TID) | SUBCUTANEOUS | Status: DC

## 2021-08-14 MED ORDER — DIPHENHYDRAMINE HCL 12.5 MG/5ML PO ELIX: 12.5000 mg | ORAL_SOLUTION | ORAL | Status: DC | PRN

## 2021-08-14 MED ORDER — HYDROMORPHONE HCL 1 MG/ML IJ SOLN
0.2500 mg | INTRAMUSCULAR | Status: DC | PRN
  Administered 2021-08-14: 0.5 mg via INTRAVENOUS

## 2021-08-14 MED ORDER — ACETAMINOPHEN 325 MG PO TABS
325.0000 mg | ORAL_TABLET | Freq: Four times a day (QID) | ORAL | Status: DC | PRN
  Administered 2021-08-15 – 2021-08-16 (×3): 650 mg via ORAL
  Filled 2021-08-14 (×3): qty 2

## 2021-08-14 MED ORDER — INSULIN DEGLUDEC 100 UNIT/ML ~~LOC~~ SOPN: 80.0000 [IU] | PEN_INJECTOR | Freq: Every day | SUBCUTANEOUS | Status: DC

## 2021-08-14 MED ORDER — PHENOL 1.4 % MT LIQD: 1.0000 | OROMUCOSAL | Status: DC | PRN

## 2021-08-14 MED ORDER — ROPIVACAINE HCL 5 MG/ML IJ SOLN
INTRAMUSCULAR | Status: DC | PRN
  Administered 2021-08-14: 30 mL via PERINEURAL

## 2021-08-14 MED ORDER — PHENYLEPHRINE HCL-NACL 20-0.9 MG/250ML-% IV SOLN
INTRAVENOUS | Status: DC | PRN
  Administered 2021-08-14: 30 ug/min via INTRAVENOUS

## 2021-08-14 MED ORDER — ALUM & MAG HYDROXIDE-SIMETH 200-200-20 MG/5ML PO SUSP: 30.0000 mL | ORAL | Status: DC | PRN

## 2021-08-14 MED ORDER — PROPOFOL 500 MG/50ML IV EMUL
INTRAVENOUS | Status: DC | PRN
  Administered 2021-08-14: 100 ug/kg/min via INTRAVENOUS

## 2021-08-14 MED ORDER — STERILE WATER FOR IRRIGATION IR SOLN
Status: DC | PRN
  Administered 2021-08-14: 2000 mL

## 2021-08-14 MED ORDER — VANCOMYCIN HCL IN DEXTROSE 1-5 GM/200ML-% IV SOLN
1000.0000 mg | Freq: Two times a day (BID) | INTRAVENOUS | Status: AC
  Administered 2021-08-14: 1000 mg via INTRAVENOUS
  Filled 2021-08-14: qty 200

## 2021-08-14 MED ORDER — DEXAMETHASONE SODIUM PHOSPHATE 10 MG/ML IJ SOLN
INTRAMUSCULAR | Status: DC | PRN
Start: 2021-08-14 — End: 2021-08-14
  Administered 2021-08-14: 5 mg via INTRAVENOUS

## 2021-08-14 MED ORDER — ROSUVASTATIN CALCIUM 20 MG PO TABS
20.0000 mg | ORAL_TABLET | Freq: Every day | ORAL | Status: DC
  Administered 2021-08-15 – 2021-08-17 (×3): 20 mg via ORAL
  Filled 2021-08-14 (×3): qty 1

## 2021-08-14 MED ORDER — METOCLOPRAMIDE HCL 5 MG PO TABS: 5.0000 mg | ORAL_TABLET | Freq: Three times a day (TID) | ORAL | Status: DC | PRN

## 2021-08-14 MED ORDER — ONDANSETRON HCL 4 MG/2ML IJ SOLN: 4.0000 mg | Freq: Four times a day (QID) | INTRAMUSCULAR | Status: DC | PRN

## 2021-08-14 MED ORDER — HYDROCHLOROTHIAZIDE 12.5 MG PO TABS
12.5000 mg | ORAL_TABLET | Freq: Every day | ORAL | Status: DC
  Administered 2021-08-16 – 2021-08-17 (×2): 12.5 mg via ORAL
  Filled 2021-08-14: qty 1

## 2021-08-14 MED ORDER — INSULIN GLARGINE-YFGN 100 UNIT/ML ~~LOC~~ SOLN
80.0000 [IU] | Freq: Every day | SUBCUTANEOUS | Status: DC
  Administered 2021-08-15 – 2021-08-17 (×3): 80 [IU] via SUBCUTANEOUS
  Filled 2021-08-14 (×3): qty 0.8

## 2021-08-14 MED ORDER — METOCLOPRAMIDE HCL 5 MG/ML IJ SOLN: 5.0000 mg | Freq: Three times a day (TID) | INTRAMUSCULAR | Status: DC | PRN

## 2021-08-14 MED ORDER — TIZANIDINE HCL 4 MG PO TABS
4.0000 mg | ORAL_TABLET | Freq: Four times a day (QID) | ORAL | Status: DC | PRN
  Administered 2021-08-14 – 2021-08-15 (×2): 4 mg via ORAL
  Filled 2021-08-14 (×2): qty 1

## 2021-08-14 MED ORDER — PROPOFOL 10 MG/ML IV BOLUS
INTRAVENOUS | Status: DC | PRN
Start: 2021-08-14 — End: 2021-08-14
  Administered 2021-08-14 (×2): 30 mg via INTRAVENOUS

## 2021-08-14 MED ORDER — DEXAMETHASONE SODIUM PHOSPHATE 4 MG/ML IJ SOLN
INTRAMUSCULAR | Status: DC | PRN
  Administered 2021-08-14: 6 mg via PERINEURAL

## 2021-08-14 MED ORDER — POLYETHYLENE GLYCOL 3350 17 G PO PACK
17.0000 g | PACK | Freq: Every day | ORAL | Status: DC | PRN
  Administered 2021-08-16: 17 g via ORAL
  Filled 2021-08-14: qty 1

## 2021-08-14 MED ORDER — MEPERIDINE HCL 50 MG/ML IJ SOLN: 6.2500 mg | INTRAMUSCULAR | Status: DC | PRN

## 2021-08-14 MED ORDER — TRANEXAMIC ACID-NACL 1000-0.7 MG/100ML-% IV SOLN
INTRAVENOUS | Status: DC | PRN
  Administered 2021-08-14: 1000 mg via INTRAVENOUS

## 2021-08-14 SURGICAL SUPPLY — 66 items
APL SKNCLS STERI-STRIP NONHPOA (GAUZE/BANDAGES/DRESSINGS)
ATTUNE MED DOME PAT 38 KNEE (Knees) ×1 IMPLANT
AUG FEM SZ7 4 REV POST STRL LF (Miscellaneous) ×2 IMPLANT
AUGMENT POST FEM SZ7 4 (Miscellaneous) ×2 IMPLANT
BAG COUNTER SPONGE SURGICOUNT (BAG) IMPLANT
BAG SPEC THK2 15X12 ZIP CLS (MISCELLANEOUS)
BAG SPNG CNTER NS LX DISP (BAG)
BAG ZIPLOCK 12X15 (MISCELLANEOUS) IMPLANT
BASE TIB KNEE REV RP ATUNE SZ6 (Knees) ×1 IMPLANT
BENZOIN TINCTURE PRP APPL 2/3 (GAUZE/BANDAGES/DRESSINGS) IMPLANT
BLADE SAG 18X100X1.27 (BLADE) ×2 IMPLANT
BLADE SURG SZ10 CARB STEEL (BLADE) ×4 IMPLANT
BNDG ELASTIC 6X5.8 VLCR STR LF (GAUZE/BANDAGES/DRESSINGS) ×3 IMPLANT
BONE CEMENT GENTAMICIN (Cement) ×6 IMPLANT
BSPLAT TIB 6 CMNT REV ROT PLAT (Knees) ×1 IMPLANT
CEMENT BONE GENTAMICIN 40 (Cement) IMPLANT
CLOTH BEACON ORANGE TIMEOUT ST (SAFETY) ×2 IMPLANT
COMP FEM ATTUNE CRS SZ7 LT (Femur) ×2 IMPLANT
COMPONENT FEM ATN CRS SZ7 LT (Femur) IMPLANT
COVER SURGICAL LIGHT HANDLE (MISCELLANEOUS) ×2 IMPLANT
CUFF TOURN SGL QUICK 34 (TOURNIQUET CUFF) ×2
CUFF TRNQT CYL 34X4.125X (TOURNIQUET CUFF) ×1 IMPLANT
DRAPE INCISE IOBAN 66X45 STRL (DRAPES) ×7 IMPLANT
DRAPE U-SHAPE 47X51 STRL (DRAPES) ×2 IMPLANT
DRSG PAD ABDOMINAL 8X10 ST (GAUZE/BANDAGES/DRESSINGS) ×3 IMPLANT
DURAPREP 26ML APPLICATOR (WOUND CARE) ×2 IMPLANT
ELECT REM PT RETURN 15FT ADLT (MISCELLANEOUS) ×2 IMPLANT
EVACUATOR 1/8 PVC DRAIN (DRAIN) IMPLANT
GAUZE SPONGE 4X4 12PLY STRL (GAUZE/BANDAGES/DRESSINGS) ×2 IMPLANT
GAUZE XEROFORM 5X9 LF (GAUZE/BANDAGES/DRESSINGS) ×1 IMPLANT
GLOVE BIO SURGEON STRL SZ7.5 (GLOVE) ×2 IMPLANT
GLOVE BIOGEL PI IND STRL 8 (GLOVE) ×2 IMPLANT
GLOVE BIOGEL PI INDICATOR 8 (GLOVE) ×2
GLOVE ECLIPSE 8.0 STRL XLNG CF (GLOVE) ×2 IMPLANT
GOWN STRL REUS W/ TWL XL LVL3 (GOWN DISPOSABLE) ×2 IMPLANT
GOWN STRL REUS W/TWL XL LVL3 (GOWN DISPOSABLE) ×4
HANDPIECE INTERPULSE COAX TIP (DISPOSABLE) ×2
HOLDER FOLEY CATH W/STRAP (MISCELLANEOUS) IMPLANT
IMMOBILIZER KNEE 20 (SOFTGOODS) ×2
IMMOBILIZER KNEE 20 THIGH 36 (SOFTGOODS) ×1 IMPLANT
INSERT TIB CRS ATTUNE SZ7 14 (Insert) ×1 IMPLANT
KIT TURNOVER KIT A (KITS) IMPLANT
NDL SAFETY ECLIPSE 18X1.5 (NEEDLE) IMPLANT
NEEDLE HYPO 18GX1.5 SHARP (NEEDLE)
PACK TOTAL KNEE CUSTOM (KITS) ×2 IMPLANT
PADDING CAST COTTON 6X4 STRL (CAST SUPPLIES) ×4 IMPLANT
PROTECTOR NERVE ULNAR (MISCELLANEOUS) ×2 IMPLANT
SET HNDPC FAN SPRY TIP SCT (DISPOSABLE) ×1 IMPLANT
SET PAD KNEE POSITIONER (MISCELLANEOUS) ×2 IMPLANT
SPIKE FLUID TRANSFER (MISCELLANEOUS) IMPLANT
STAPLER VISISTAT 35W (STAPLE) IMPLANT
STEM CEMT ATTUNE 14X80 (Knees) ×1 IMPLANT
STEM REV CEMENTED 14X50MM (Stem) ×1 IMPLANT
STRIP CLOSURE SKIN 1/2X4 (GAUZE/BANDAGES/DRESSINGS) IMPLANT
SUT MNCRL AB 4-0 PS2 18 (SUTURE) IMPLANT
SUT VIC AB 0 CT1 36 (SUTURE) ×2 IMPLANT
SUT VIC AB 1 CT1 36 (SUTURE) ×4 IMPLANT
SUT VIC AB 2-0 CT1 27 (SUTURE) ×4
SUT VIC AB 2-0 CT1 TAPERPNT 27 (SUTURE) ×2 IMPLANT
SWAB COLLECTION DEVICE MRSA (MISCELLANEOUS) IMPLANT
SWAB CULTURE ESWAB REG 1ML (MISCELLANEOUS) IMPLANT
SYR 3ML LL SCALE MARK (SYRINGE) IMPLANT
TOWER CARTRIDGE SMART MIX (DISPOSABLE) IMPLANT
TRAY FOLEY MTR SLVR 16FR STAT (SET/KITS/TRAYS/PACK) ×2 IMPLANT
TUBE KAMVAC SUCTION (TUBING) IMPLANT
WATER STERILE IRR 1000ML POUR (IV SOLUTION) ×2 IMPLANT

## 2021-08-14 NOTE — Anesthesia Procedure Notes (Signed)
Procedure Name: MAC Date/Time: 08/14/2021 3:01 PM Performed by: Claudia Desanctis, CRNA Pre-anesthesia Checklist: Patient identified, Emergency Drugs available, Suction available and Patient being monitored Patient Re-evaluated:Patient Re-evaluated prior to induction Oxygen Delivery Method: Simple face mask

## 2021-08-14 NOTE — Interval H&P Note (Signed)
History and Physical Interval Note: The patient understands fully that he is here for a two-stage revision of his left total knee arthroplasty.  He had that with an infected arthroplasty that was excised and a temporary cement spacer with antibiotics was placed.  He had been on 6 weeks of antibiotics and then had a antibiotic holiday.  He has now been cleared of infection as far as can be told based on laboratory analysis and aspirations of the knee.  He now presents for the revision arthroplasty removing the cement spacer and placing a total knee revision into the left knee.  The risk and benefits of surgery been explained in detail including the risk of continued infection.  Informed consent is obtained and the left knee has been marked.  08/14/2021 1:04 PM  [name]  has presented today for surgery, with the diagnosis of previous infected left total knee.  The various methods of treatment have been discussed with the patient and family. After consideration of risks, benefits and other options for treatment, the patient has consented to  Procedure(s): REMOVAL OF ANTIBIOTIC SPACER AND LEFT TOTAL KNEE REVISION ARTHROPLASTY (Left) as a surgical intervention.  The patient's history has been reviewed, patient examined, no change in status, stable for surgery.  I have reviewed the patient's chart and labs.  Questions were answered to the patient's satisfaction.     Mcarthur Rossetti

## 2021-08-14 NOTE — Transfer of Care (Signed)
Immediate Anesthesia Transfer of Care Note  Patient: [name]  Procedure(s) Performed: REMOVAL OF ANTIBIOTIC SPACER AND LEFT TOTAL KNEE REVISION ARTHROPLASTY (Left: Knee)  Patient Location: PACU  Anesthesia Type:Spinal  Level of Consciousness: awake and patient cooperative  Airway & Oxygen Therapy: Patient Spontanous Breathing and Patient connected to face mask  Post-op Assessment: Report given to RN and Post -op Vital signs reviewed and stable  Post vital signs: Reviewed and stable  Last Vitals:  Vitals Value Taken Time  BP 96/73 08/14/21 1725  Temp    Pulse 58 08/14/21 1729  Resp 17 08/14/21 1729  SpO2 98 % 08/14/21 1729  Vitals shown include unvalidated device data.  Last Pain:  Vitals:   08/14/21 1118  TempSrc:   PainSc: 4       Patients Stated Pain Goal: 3 (02/54/27 0623)  Complications: No notable events documented.

## 2021-08-14 NOTE — Discharge Instructions (Signed)

## 2021-08-14 NOTE — Progress Notes (Signed)
AssistedDr. Germeroth with left, adductor canal block. Side rails up, monitors on throughout procedure. See vital signs in flow sheet. Tolerated Procedure well.  

## 2021-08-14 NOTE — Brief Op Note (Signed)
08/14/2021  5:01 PM  PATIENT:  [name]  66 y.o. male  PRE-OPERATIVE DIAGNOSIS:  previous infected left total knee  POST-OPERATIVE DIAGNOSIS:  previous infected left total knee  PROCEDURE:  Procedure(s): REMOVAL OF ANTIBIOTIC SPACER AND LEFT TOTAL KNEE REVISION ARTHROPLASTY (Left)  SURGEON:  Surgeon(s) and Role:    Mcarthur Rossetti, MD - Primary  PHYSICIAN ASSISTANT:  Benita Stabile, PA-C  ANESTHESIA:   regional and spinal  EBL:  100 mL   COUNTS:  YES  TOURNIQUET:   Total Tourniquet Time Documented: Thigh (Left) - 85 minutes Total: Thigh (Left) - 85 minutes   PLAN OF CARE: Admit to inpatient   PATIENT DISPOSITION:  PACU - hemodynamically stable.  [id number]  Delay start of Pharmacological VTE agent (>24hrs) due to surgical blood loss or risk of bleeding: no

## 2021-08-14 NOTE — Plan of Care (Signed)
Plan of care discussed.   

## 2021-08-14 NOTE — Anesthesia Preprocedure Evaluation (Addendum)
Anesthesia Evaluation  Patient identified by MRN, date of birth, ID band Patient awake    Reviewed: Allergy & Precautions, H&P , NPO status , Patient's Chart, lab work & pertinent test results  Airway Mallampati: II  TM Distance: >3 FB Neck ROM: Full    Dental no notable dental hx. (+) Dental Advisory Given, Teeth Intact   Pulmonary neg pulmonary ROS,    Pulmonary exam normal breath sounds clear to auscultation       Cardiovascular hypertension, Pt. on medications Normal cardiovascular exam Rhythm:Regular Rate:Normal     Neuro/Psych Depression negative neurological ROS     GI/Hepatic Neg liver ROS, GERD  ,  Endo/Other  diabetes, Type 2, Insulin Dependent, Oral Hypoglycemic Agents  Renal/GU negative Renal ROS     Musculoskeletal  (+) Arthritis , Osteoarthritis,    Abdominal (+) + obese,   Peds  Hematology negative hematology ROS (+)   Anesthesia Other Findings Infected knee prosthesis  Reproductive/Obstetrics                          Anesthesia Physical  Anesthesia Plan  ASA: 3  Anesthesia Plan: Spinal   Post-op Pain Management: Regional block*, Tylenol PO (pre-op)* and Toradol IV (intra-op)*   Induction:   PONV Risk Score and Plan: 2 and Propofol infusion, Treatment may vary due to age or medical condition, Ondansetron, TIVA and Dexamethasone  Airway Management Planned: Nasal Cannula and Simple Face Mask  Additional Equipment: None  Intra-op Plan:   Post-operative Plan:   Informed Consent: I have reviewed the patients History and Physical, chart, labs and discussed the procedure including the risks, benefits and alternatives for the proposed anesthesia with the patient or authorized representative who has indicated his/her understanding and acceptance.     Dental advisory given  Plan Discussed with: CRNA  Anesthesia Plan Comments:        Anesthesia Quick  Evaluation

## 2021-08-14 NOTE — Anesthesia Procedure Notes (Signed)
Anesthesia Regional Block: Adductor canal block   Pre-Anesthetic Checklist: , timeout performed,  Correct Patient, Correct Site, Correct Laterality,  Correct Procedure, Correct Position, site marked,  Risks and benefits discussed,  Surgical consent,  Pre-op evaluation,  At surgeon's request and post-op pain management  Laterality: Lower and Left  Prep: chloraprep       Needles:  Injection technique: Single-shot  Needle Type: Stimiplex     Needle Length: 9cm  Needle Gauge: 21     Additional Needles:   Procedures:,,,, ultrasound used (permanent image in chart),,    Narrative:  Start time: 08/14/2021 1:33 PM End time: 08/14/2021 1:53 PM Injection made incrementally with aspirations every 5 mL.  Performed by: Personally  Anesthesiologist: Nolon Nations, MD  Additional Notes: BP cuff, EKG monitors applied. Sedation begun. Artery and nerve location verified with ultrasound. Anesthetic injected incrementally (78m), slowly, and after negative aspirations under direct u/s guidance. Good fascial/perineural spread. Tolerated well.

## 2021-08-14 NOTE — Anesthesia Procedure Notes (Addendum)
Spinal  Patient location during procedure: OR Start time: 08/14/2021 2:43 PM End time: 08/14/2021 2:48 PM Reason for block: surgical anesthesia Staffing Performed: anesthesiologist  Anesthesiologist: Nolon Nations, MD Preanesthetic Checklist Completed: patient identified, IV checked, site marked, risks and benefits discussed, surgical consent, monitors and equipment checked, pre-op evaluation and timeout performed Spinal Block Patient position: sitting Prep: DuraPrep and site prepped and draped Patient monitoring: heart rate, continuous pulse ox and blood pressure Approach: midline Location: L3-4 Injection technique: single-shot Needle Needle type: Spinocan  Needle gauge: 25 G Needle length: 9 cm Additional Notes Expiration date of kit checked and confirmed. Patient tolerated procedure well, without complications.

## 2021-08-15 LAB — CBC
HCT: 30.3 % — ABNORMAL LOW (ref 39.0–52.0)
Hemoglobin: 10.7 g/dL — ABNORMAL LOW (ref 13.0–17.0)
MCH: 30.5 pg (ref 26.0–34.0)
MCHC: 35.3 g/dL (ref 30.0–36.0)
MCV: 86.3 fL (ref 80.0–100.0)
Platelets: 132 K/uL — ABNORMAL LOW (ref 150–400)
RBC: 3.51 MIL/uL — ABNORMAL LOW (ref 4.22–5.81)
RDW: 13.4 % (ref 11.5–15.5)
WBC: 9.1 K/uL (ref 4.0–10.5)
nRBC: 0 % (ref 0.0–0.2)

## 2021-08-15 LAB — BASIC METABOLIC PANEL
Anion gap: 6 (ref 5–15)
BUN: 23 mg/dL (ref 8–23)
CO2: 25 mmol/L (ref 22–32)
Calcium: 8.5 mg/dL — ABNORMAL LOW (ref 8.9–10.3)
Chloride: 105 mmol/L (ref 98–111)
Creatinine, Ser: 1.22 mg/dL (ref 0.61–1.24)
GFR, Estimated: >60 mL/min (ref >60)
Glucose, Bld: 304 mg/dL — ABNORMAL HIGH (ref 70–99)
Potassium: 4.9 mmol/L (ref 3.5–5.1)
Sodium: 136 mmol/L (ref 135–145)

## 2021-08-15 LAB — GLUCOSE, CAPILLARY
Glucose-Capillary: 209 mg/dL — ABNORMAL HIGH (ref 70–99)
Glucose-Capillary: 276 mg/dL — ABNORMAL HIGH (ref 70–99)
Glucose-Capillary: 259 mg/dL — ABNORMAL HIGH (ref 70–99)
Glucose-Capillary: 197 mg/dL — ABNORMAL HIGH (ref 70–99)

## 2021-08-15 LAB — HEMOGLOBIN A1C
Hgb A1c MFr Bld: 7.4 % — ABNORMAL HIGH (ref 4.8–5.6)
Mean Plasma Glucose: 165.68 mg/dL

## 2021-08-15 MED ORDER — OXYCODONE HCL 5 MG PO TABS
5.0000 mg | ORAL_TABLET | Freq: Four times a day (QID) | ORAL | Status: DC | PRN
  Administered 2021-08-15: 5 mg via ORAL
  Administered 2021-08-16 – 2021-08-17 (×6): 10 mg via ORAL
  Filled 2021-08-15 (×4): qty 2
  Filled 2021-08-15: qty 1
  Filled 2021-08-15 (×2): qty 2

## 2021-08-15 MED ORDER — INSULIN ASPART 100 UNIT/ML IJ SOLN: 0.0000 [IU] | Freq: Every day | INTRAMUSCULAR | Status: DC

## 2021-08-15 MED ORDER — SODIUM CHLORIDE 0.9 % IV BOLUS
250.0000 mL | Freq: Once | INTRAVENOUS | Status: AC
  Administered 2021-08-15: 250 mL via INTRAVENOUS

## 2021-08-15 MED ORDER — DOXYCYCLINE HYCLATE 100 MG PO TABS
100.0000 mg | ORAL_TABLET | Freq: Two times a day (BID) | ORAL | Status: DC
  Administered 2021-08-15 – 2021-08-17 (×5): 100 mg via ORAL
  Filled 2021-08-15 (×5): qty 1

## 2021-08-15 MED ORDER — METHOCARBAMOL 500 MG PO TABS
500.0000 mg | ORAL_TABLET | Freq: Three times a day (TID) | ORAL | Status: DC | PRN
  Administered 2021-08-15 – 2021-08-16 (×4): 500 mg via ORAL
  Filled 2021-08-15 (×4): qty 1

## 2021-08-15 MED ORDER — INSULIN ASPART 100 UNIT/ML IJ SOLN
15.0000 [IU] | Freq: Once | INTRAMUSCULAR | Status: AC
  Administered 2021-08-15: 15 [IU] via SUBCUTANEOUS

## 2021-08-15 MED ORDER — INSULIN ASPART 100 UNIT/ML IJ SOLN
0.0000 [IU] | Freq: Three times a day (TID) | INTRAMUSCULAR | Status: DC
  Administered 2021-08-15: 8 [IU] via SUBCUTANEOUS

## 2021-08-15 NOTE — Progress Notes (Signed)
PT Cancellation Note  Patient Details Name: [name] MRN: [id number] DOB: November 01, 1955   Cancelled Treatment:    Reason Eval/Treat Not Completed: Patient not medically ready. BP 86/50, will hold PT for now, pt to receive bolus this am. Will attempt again as schedule allows   [facility] 08/15/2021, 9:58 AM

## 2021-08-15 NOTE — Op Note (Signed)
[name], [name] MEDICAL RECORD NO: [id number] ACCOUNT NO: [id number] DATE OF BIRTH: Dec 26, 1955 FACILITY: [facility]ATION: [id number] PHYSICIAN: Lind Guest. Ninfa Linden, MD  Operative Report   DATE OF PROCEDURE: 08/14/2021   PREOPERATIVE DIAGNOSIS:  Previous infected left total knee arthroplasty, status post excisional arthroplasty and placement of antibiotic spacer presenting for 2-stage revision arthroplasty.  POSTOPERATIVE DIAGNOSIS:  Previous infected left total knee arthroplasty, status post excisional arthroplasty and placement of antibiotic spacer presenting for 2-stage revision arthroplasty.  DESCRIPTION:   1.  Removal of antibiotic spacer, left knee. 2.  Irrigation and debridement with synovectomy, left knee. 3.  Left total knee revision arthroplasty with revision of all components.  IMPLANTS:  DePuy size 7 left CRS femur with 4 mm posterior medial and posterolateral wedges and a 14 x 80 cemented stem, size 6 RP revision tray tibia with a 14 x 50 cemented stem, 14 mm RP CRS insert (DePuy Attune revision knee system).  SURGEON:  Lind Guest. Ninfa Linden, MD  ASSISTANT:  Erskine Emery, PA-C.  ANESTHESIA:   1.  Left lower extremity adductor canal block. 2.  Spinal.  ANTIBIOTICS:  2 g IV Ancef.  ESTIMATED BLOOD LOSS:  Between 100 and 200 mL.  COMPLICATIONS:  None.  INDICATIONS:  The patient is a 66 year old gentleman who over a year ago underwent a previous total knee arthroplasty with a press-fit implant for his left knee.  He had done well, but then unfortunately developed an infection.  He had a decline in his  medical status including poorly controlled diabetes that happened after his surgery.  At the time of his first infection he had a sensitive Staph infection.  An open arthrotomy was performed with a synovectomy and exchange of his poly liner.  He was then  on 6 weeks of IV antibiotics.  After a period of being off antibiotics, he was assessed again.  He was found to  have continued infection of his knee.  He was then taken to the operating room for excision of all components including synovectomy and then  placement of an articulating antibiotic cement spacer.  After a second infection, he was found to have MRSA.  He was then on 6 weeks of IV antibiotics.  Afterwards he was given antibiotic holiday for 2 weeks.  All of his infectious parameters labs  including CRP, sed rate, and white blood cell count normalized.  Fluid was taken off of his knee and it was negative for any organisms and minimal white cells.  At this point, he is presenting for an attempted revision arthroplasty.  He understands this  can be quite difficult given the history of MRSA.  We also explained the risk of acute blood loss anemia, infection, fracture, DVT, implant failure and loosening.  DESCRIPTION OF PROCEDURE:  After informed consent was obtained, appropriate left knee was marked and adductor canal block was obtained in the left lower extremity in the holding room.  He was then brought to the operating room and sat up on the operating  table.  Spinal anesthesia was obtained.  He was then laid in supine position on the operating table and a nonsterile tourniquet was placed on his upper left thigh.  His left thigh, knee, leg, ankle and foot were prepped and draped with DuraPrep and  sterile drapes including a sterile stockinette.  A timeout was called and he was identified as correct patient, correct left knee.  We then used Esmarch to wrap that leg and tourniquet was inflated to 300 mm  of pressure.  We then made a direct midline  incision over his old incision and carried this proximally and distally.  We dissected the knee joint through abundant scar tissue and was able to perform a medial parapatellar arthrotomy again through abundant scar tissue.  There was an effusion noted,  but no gross purulence at all.  We then were able to remove all cement debris and the articulating spacer without  any difficulty at all.  We then performed a synovectomy of all three compartments and excisional debridement sharply with a knife and  removing synovium and fascia.  We then proceeded with the revision component of the case.  We did a freshening cut on the proximal tibia.  Once we were able to do that, we then chose our size 6 revision tray.  We then reamed up to a 14 mm stem.  We then  went to the femur and chose a size 7 femur.  We reamed up to 14 mm for a cemented stem for that.  We then freshened up the femoral box cut and our anterior and posterior cuts, followed by our chamfer cuts.  We then also cut our patella thinly to  allow [place] to place a size 38 patellar button.  We did trial a 7 left CR femur with a 14 x 80 stem with two 4 mm medial and lateral posterior wedge inserts.  We placed our 6 RP revision tray trial with a 14 x 50 stem and then trialled up to a 14 mm RP  insert.  That was the only that gave [place] the stability, put him through an arc of motion and assessing for varus and valgus stability as well.  We then removed all trial instrumentation from the knee and irrigated the knee with 4 liters normal saline  solution using pulsatile lavage.  We then mixed our cement that had gentamicin in it.  Once we mixed our cement, we put the knee in a flexed position and then cemented our size 6 RP revision tray tibia, we went from the DePuy Attune revision system with  a 14 x 50 cemented stem.  We then cemented our 7 left CRS femur with two 4 mm posterior, medial and lateral wedges and a 14 x 80 cemented stem.  Once we had got those down, we then placed our 14 mm RP CRS insert and cemented our patellar button.  We then  held the knee fully extended and compressed to allow the cement to harden.  Once it was hardened, the tourniquet was let down.  There was bleeding noted from the scar tissue, but no other significant bleeding.  We tried to close the arthrotomy as much  as we could with interrupted #1 Vicryl  and Ethibond suture.  0 Vicryl was used to close the deep tissue and 2-0 Vicryl was used to close the subcutaneous tissue.  The skin was closed with staples.  A well-padded sterile dressing was applied.  He was  taken to recovery room in stable condition with all final counts being correct, there were no complications noted.  Of note, Benita Stabile, PA-C, assisted from the beginning to the end of this case and his assistance was medically necessary and to help  facilitate the case with retracting soft tissues and helping guide implant placement was also important and assistance with a layered closure of the wound.     SUJ D: 08/14/2021 4:58:57 pm T: 08/15/2021 1:30:00 am  JOB: [id number]/ [id number]

## 2021-08-15 NOTE — Evaluation (Signed)
Physical Therapy Evaluation Patient Details Name: [name] MRN: [id number] DOB: Aug 02, 1955 Today's Date: 08/15/2021  History of Present Illness  66 yo male admitted for removal abx spacer and L TKA reimplantation.PMH: L total knee prosthetic infection, revision, I&D with placement of antibiotic articulating spacer 04/2421, DM  Clinical Impression  Pt is s/p reimplantation TKA resulting in the deficits listed below (see PT Problem List).  PT with hypotension earlier today, BP improved thi spm. Pt amb~ 80', some mild dizziness reported. Initiated HEP/knee ROM. Pt tol well, anticipate steady progress in acute setting. Pt is hopeful to d/c Sunday  Pt will benefit from skilled PT to increase their independence and safety with mobility to allow discharge to the venue listed below.         Recommendations for follow up therapy are one component of a multi-disciplinary discharge planning process, led by the attending physician.  Recommendations may be updated based on patient status, additional functional criteria and insurance authorization.  Follow Up Recommendations Follow physician's recommendations for discharge plan and follow up therapies    Assistance Recommended at Discharge PRN  Patient can return home with the following  Assistance with cooking/housework;Assist for transportation;Help with stairs or ramp for entrance    Equipment Recommendations None recommended by PT  Recommendations for Other Services       Functional Status Assessment Patient has had a recent decline in their functional status and demonstrates the ability to make significant improvements in function in a reasonable and predictable amount of time.     Precautions / Restrictions Precautions Precautions: Knee;Fall Restrictions Weight Bearing Restrictions: No LLE Weight Bearing: Weight bearing as tolerated      Mobility  Bed Mobility Overal bed mobility: Needs Assistance Bed Mobility: Supine to  Sit     Supine to sit: Min guard     General bed mobility comments: for safety    Transfers Overall transfer level: Needs assistance Equipment used: Rolling walker (2 wheels) Transfers: Sit to/from Stand Sit to Stand: Min guard           General transfer comment: cues for hand placement and RLE position    Ambulation/Gait Ambulation/Gait assistance: Min guard Gait Distance (Feet): 80 Feet Assistive device: Rolling walker (2 wheels) Gait Pattern/deviations: Step-to pattern, Decreased stance time - left       General Gait Details: cues for step length, good stability with RW, no LOB. distance limited by PT--pt with slight dizziness, LEs feeling "weak" (BP soft this date)  Financial trader Rankin (Stroke Patients Only)       Balance                                             Pertinent Vitals/Pain Pain Assessment Pain Assessment: 0-10 Pain Location: left knee Pain Descriptors / Indicators: Aching, Grimacing Pain Intervention(s): Limited activity within patient's tolerance, Monitored during session, Premedicated before session, Repositioned, Ice applied    Home Living Family/patient expects to be discharged to:: Private residence Living Arrangements: Spouse/significant other Available Help at Discharge: Family;Available 24 hours/day Type of Home: House Home Access: Stairs to enter Entrance Stairs-Rails: Right Entrance Stairs-Number of Steps: 3-garage   Home Layout: One level Home Equipment: Counsellor (2 wheels)      Prior Function Prior Level of Function : Independent/Modified  Independent                     Hand Dominance        Extremity/Trunk Assessment   Upper Extremity Assessment Upper Extremity Assessment: Overall WFL for tasks assessed    Lower Extremity Assessment Lower Extremity Assessment: LLE deficits/detail LLE Deficits / Details: knee extension and  hip flexion 2+ to 3/5, limited by post op pain and weakness.; AAROm knee flexion ~ 10 to 55 degrees       Communication   Communication: No difficulties  Cognition Arousal/Alertness: Awake/alert Behavior During Therapy: WFL for tasks assessed/performed Overall Cognitive Status: Within Functional Limits for tasks assessed                                          General Comments      Exercises Total Joint Exercises Ankle Circles/Pumps: AROM, Both, 10 reps Heel Slides: AAROM, Left, 15 reps   Assessment/Plan    PT Assessment Patient needs continued PT services  PT Problem List Decreased strength;Decreased range of motion;Decreased activity tolerance;Decreased balance;Decreased mobility;Decreased knowledge of use of DME;Pain       PT Treatment Interventions DME instruction;Therapeutic exercise;Gait training;Stair training;Functional mobility training;Therapeutic activities;Patient/family education    PT Goals (Current goals can be found in the Care Plan section)  Acute Rehab PT Goals Patient Stated Goal: be able to move knee again PT Goal Formulation: With patient Time For Goal Achievement: 08/21/21 Potential to Achieve Goals: Good    Frequency 7X/week     Co-evaluation               AM-PAC PT "6 Clicks" Mobility  Outcome Measure Help needed turning from your back to your side while in a flat bed without using bedrails?: A Little Help needed moving from lying on your back to sitting on the side of a flat bed without using bedrails?: A Little Help needed moving to and from a bed to a chair (including a wheelchair)?: A Little Help needed standing up from a chair using your arms (e.g., wheelchair or bedside chair)?: A Little Help needed to walk in hospital room?: A Little Help needed climbing 3-5 steps with a railing? : A Little 6 Click Score: 18    End of Session Equipment Utilized During Treatment: Gait belt Activity Tolerance: Patient tolerated  treatment well Patient left: in chair;with call bell/phone within reach;with chair alarm set;with family/visitor present Nurse Communication: Mobility status PT Visit Diagnosis: Other abnormalities of gait and mobility (R26.89);Difficulty in walking, not elsewhere classified (R26.2)    Time: 7482-7078 PT Time Calculation (min) (ACUTE ONLY): 33 min   Charges:   PT Evaluation $PT Eval Low Complexity: 1 Low PT Treatments $Gait Training: 8-22 mins        Baxter Flattery, PT  Acute Rehab Dept ([place]) [phone] Pager [phone]  08/15/2021   [facility] 08/15/2021, 3:38 PM

## 2021-08-15 NOTE — Progress Notes (Addendum)
Erskine Emery notified of pt low bp of 88/50 after bolus given. Pt with no s/s of low bp other than being sleepy (though pt was alert and talking with staff) with this episode. Rn did hold pt bp meds this am due to history of low bp last night. Additional orders placed in EMR by RN after call with provider. PT remains stable. Rn will continue to monitor.

## 2021-08-15 NOTE — TOC Transition Note (Signed)
Transition of Care (TOC) - CM/SW Discharge Note   Patient Details  Name: [name] MRN: [id number] Date of Birth: 10/02/1955  Transition of Care (TOC) CM/SW Contact:  HOYLE, LUCY, LCSW Phone Number: 08/15/2021, 10:47 AM   Clinical Narrative:    Met with pt and wife this morning and confirming pt has all needed DME at home.  Confirm need for HHPT and no agency preference - [facility].  No further TOC needs.   Final next level of care: Home w Home Health Services Barriers to Discharge: No Barriers Identified   Patient Goals and CMS Choice Patient states their goals for this hospitalization and ongoing recovery are:: return home      Discharge Placement                       Discharge Plan and Services                DME Arranged: N/A DME Agency: NA       HH Arranged: PT HH Agency: [facility] Date HH Agency Contacted: 08/15/21 Time HH Agency Contacted: 0945 Representative spoke with at HH Agency: Jason  Social Determinants of Health (SDOH) Interventions     Readmission Risk Interventions     View : No data to display.             

## 2021-08-15 NOTE — Progress Notes (Signed)
Subjective: 1 Day Post-Op Procedure(s) (LRB): REMOVAL OF ANTIBIOTIC SPACER AND LEFT TOTAL KNEE REVISION ARTHROPLASTY (Left) Patient reports pain as moderate.    Objective: Vital signs in last 24 hours: Temp:  [97.3 F (36.3 C)-98.7 F (37.1 C)] 97.7 F (36.5 C) (05/27 0510) Pulse Rate:  [51-64] 52 (05/27 0510) Resp:  [10-21] 16 (05/27 0510) BP: (85-155)/(52-80) 120/64 (05/27 0745) SpO2:  [92 %-100 %] 98 % (05/27 0510) Weight:  [91.6 kg] 91.6 kg (05/26 1118)  Intake/Output from previous day: 05/26 0701 - 05/27 0700 In: 3999 [P.O.:1440; I.V.:2359; IV Piggyback:200] Out: 925 [Urine:825; Blood:100] Intake/Output this shift: Total I/O In: 386.2 [P.O.:240; I.V.:146.2] Out: 200 [Urine:200]  Recent Labs    08/15/21 0304  HGB 10.7*   Recent Labs    08/15/21 0304  WBC 9.1  RBC 3.51*  HCT 30.3*  PLT 132*   Recent Labs    08/15/21 0304  NA 136  K 4.9  CL 105  CO2 25  BUN 23  CREATININE 1.22  GLUCOSE 304*  CALCIUM 8.5*   No results for input(s): LABPT, INR in the last 72 hours.  Sensation intact distally Intact pulses distally Dorsiflexion/Plantar flexion intact Incision: scant drainage Compartment soft   Assessment/Plan: 1 Day Post-Op Procedure(s) (LRB): REMOVAL OF ANTIBIOTIC SPACER AND LEFT TOTAL KNEE REVISION ARTHROPLASTY (Left) Up with therapy Plan for discharge tomorrow      Mcarthur Rossetti 08/15/2021, 8:53 AM

## 2021-08-16 ENCOUNTER — Other Ambulatory Visit: Payer: Self-pay | Admitting: Physician Assistant

## 2021-08-16 LAB — GLUCOSE, CAPILLARY
Glucose-Capillary: 119 mg/dL — ABNORMAL HIGH (ref 70–99)
Glucose-Capillary: 191 mg/dL — ABNORMAL HIGH (ref 70–99)
Glucose-Capillary: 164 mg/dL — ABNORMAL HIGH (ref 70–99)
Glucose-Capillary: 173 mg/dL — ABNORMAL HIGH (ref 70–99)

## 2021-08-16 MED ORDER — SENNA 8.6 MG PO TABS
1.0000 | ORAL_TABLET | Freq: Two times a day (BID) | ORAL | Status: DC
  Administered 2021-08-16 – 2021-08-17 (×3): 8.6 mg via ORAL
  Filled 2021-08-16 (×3): qty 1

## 2021-08-16 MED ORDER — POLYETHYLENE GLYCOL 3350 17 G PO PACK
17.0000 g | PACK | Freq: Every day | ORAL | Status: DC
  Administered 2021-08-16 – 2021-08-17 (×2): 17 g via ORAL
  Filled 2021-08-16 (×2): qty 1

## 2021-08-16 NOTE — Progress Notes (Signed)
Erskine Emery notified of need for additional medication for constipation. Pt reported feeling constipated this morning on rounding.

## 2021-08-16 NOTE — Progress Notes (Signed)
08/16/21 1400  PT Visit Information  Last PT Received On 08/16/21  Assistance Needed +1 Kevontae is progressing quite well. Reviewed gait with RW and crutch, stairs and HEP. Continue PT POC in acute setting. Should be ready to d/c tomorrow   History of Present Illness 66 yo male admitted for removal abx spacer and L TKA reimplantation.PMH: L total knee prosthetic infection, revision, I&D with placement of antibiotic articulating spacer 04/2421, DM  Subjective Data  Patient Stated Goal be able to move knee again  Precautions  Precautions Knee;Fall  Restrictions  Weight Bearing Restrictions No  LLE Weight Bearing WBAT  Pain Assessment  Pain Assessment 0-10  Pain Score 4  Pain Location left knee  Pain Descriptors / Indicators Aching;Discomfort;Pressure  Pain Intervention(s) Limited activity within patient's tolerance;Monitored during session;Premedicated before session;Ice applied  Cognition  Arousal/Alertness Awake/alert  Behavior During Therapy WFL for tasks assessed/performed  Overall Cognitive Status Within Functional Limits for tasks assessed  Bed Mobility  Overal bed mobility Needs Assistance  Bed Mobility Supine to Sit;Sit to Supine  Supine to sit Supervision;Modified independent (Device/Increase time)  Sit to supine Supervision;Modified independent (Device/Increase time)  General bed mobility comments for safety. encouraged to attempt lifting LLE on to bed without self assist with RLE  Transfers  Overall transfer level Needs assistance  Equipment used Rolling walker (2 wheels)  Transfers Sit to/from Stand  Sit to Stand Supervision  General transfer comment cues for hand placement and RLE position  Ambulation/Gait  Ambulation/Gait assistance Min guard;Supervision  Gait Distance (Feet) 380 Feet  Assistive device Rolling walker (2 wheels);Crutches (single crutch R UE)  Gait Pattern/deviations Decreased stance time - left;Step-through pattern  General Gait Details cues for  stride length, for step through gait, cues for heel strike and knee flexion LLE, incr stance time on L. gait is more fluid with RW vs cane. no LOB  Stairs Yes  Stairs assistance Min guard;Supervision  Stair Management One rail Right;Forwards  Number of Stairs 3  General stair comments cues for sequence  Total Joint Exercises  Ankle Circles/Pumps AROM;Both;10 reps  Short Arc [employer];Left;10 reps  Straight Leg Raises AAROM;Left;10 reps  Knee Flexion AAROM;Left;5 reps;Seated  PT - End of Session  Equipment Utilized During Treatment Gait belt  Activity Tolerance Patient tolerated treatment well  Patient left in bed;with call bell/phone within reach  Nurse Communication Mobility status   PT - Assessment/Plan  PT Plan Current plan remains appropriate  PT Visit Diagnosis Other abnormalities of gait and mobility (R26.89);Difficulty in walking, not elsewhere classified (R26.2)  PT Frequency (ACUTE ONLY) 7X/week  Follow Up Recommendations Follow physician's recommendations for discharge plan and follow up therapies  Assistance recommended at discharge PRN  Patient can return home with the following Assistance with cooking/housework;Assist for transportation;Help with stairs or ramp for entrance  PT equipment None recommended by PT  AM-PAC PT "6 Clicks" Mobility Outcome Measure (Version 2)  Help needed turning from your back to your side while in a flat bed without using bedrails? 3  Help needed moving from lying on your back to sitting on the side of a flat bed without using bedrails? 3  Help needed moving to and from a bed to a chair (including a wheelchair)? 3  Help needed standing up from a chair using your arms (e.g., wheelchair or bedside chair)? 3  Help needed to walk in hospital room? 3  Help needed climbing 3-5 steps with a railing?  3  6 Click Score 18  Consider  Recommendation of Discharge To: Home with [facility]  Progressive Mobility  What is the highest level of mobility based on  the progressive mobility assessment? Level 5 (Walks with assist in room/hall) - Balance while stepping forward/back and can walk in room with assist - Complete  Activity Ambulated independently in room;Ambulated independently in hallway  PT Goal Progression  Progress towards PT goals Progressing toward goals  Acute Rehab PT Goals  PT Goal Formulation With patient  Time For Goal Achievement 08/21/21  Potential to Achieve Goals Good  PT Time Calculation  PT Start Time (ACUTE ONLY) 1406  PT Stop Time (ACUTE ONLY) 1425  PT Time Calculation (min) (ACUTE ONLY) 19 min  PT General Charges  $$ ACUTE PT VISIT 1 Visit  PT Treatments  $Gait Training 8-22 mins

## 2021-08-16 NOTE — Progress Notes (Signed)
Subjective: 2 Days Post-Op Procedure(s) (LRB): REMOVAL OF ANTIBIOTIC SPACER AND LEFT TOTAL KNEE REVISION ARTHROPLASTY (Left) Patient reports pain as moderate to severe. Up once with PT yesterday . Limited PT yesterday due to hypotension and pain.   Objective: Vital signs in last 24 hours: Temp:  [97.5 F (36.4 C)-98.1 F (36.7 C)] 98.1 F (36.7 C) (05/28 0536) Pulse Rate:  [52-66] 66 (05/28 0536) Resp:  [14-18] 14 (05/28 0536) BP: (86-143)/(50-67) 118/64 (05/28 0536) SpO2:  [96 %-99 %] 96 % (05/28 0536)  Intake/Output from previous day: 05/27 0701 - 05/28 0700 In: 3091.3 [P.O.:1320; I.V.:1257.4; IV Piggyback:513.9] Out: 2600 [Urine:2600] Intake/Output this shift: Total I/O In: 390 [P.O.:240; I.V.:150] Out: 500 [Urine:500]  Recent Labs    08/15/21 0304  HGB 10.7*   Recent Labs    08/15/21 0304  WBC 9.1  RBC 3.51*  HCT 30.3*  PLT 132*   Recent Labs    08/15/21 0304  NA 136  K 4.9  CL 105  CO2 25  BUN 23  CREATININE 1.22  GLUCOSE 304*  CALCIUM 8.5*   No results for input(s): LABPT, INR in the last 72 hours.  Intact pulses distally Dorsiflexion/Plantar flexion intact Incision: moderate drainage Compartment soft   Assessment/Plan: 2 Days Post-Op Procedure(s) (LRB): REMOVAL OF ANTIBIOTIC SPACER AND LEFT TOTAL KNEE REVISION ARTHROPLASTY (Left) Up with therapy Possible discharge to home tomorrow if patient does well with PT and remains stable.     [name] 08/16/2021, 8:47 AM

## 2021-08-16 NOTE — Anesthesia Postprocedure Evaluation (Signed)
Anesthesia Post Note  Patient: Jonpaul Lumm  Procedure(s) Performed: REMOVAL OF ANTIBIOTIC SPACER AND LEFT TOTAL KNEE REVISION ARTHROPLASTY (Left: Knee)     Patient location during evaluation: Nursing Unit Anesthesia Type: Spinal Level of consciousness: oriented and awake and alert Pain management: pain level controlled Vital Signs Assessment: post-procedure vital signs reviewed and stable Respiratory status: spontaneous breathing and respiratory function stable Cardiovascular status: blood pressure returned to baseline and stable Postop Assessment: no headache, no backache, no apparent nausea or vomiting and patient able to bend at knees Anesthetic complications: no   No notable events documented.  Last Vitals:  Vitals:   08/15/21 2123 08/16/21 0536  BP: 132/63 118/64  Pulse: 65 66  Resp: 16 14  Temp: 36.5 C 36.7 C  SpO2: 97% 96%    Last Pain:  Vitals:   08/16/21 0841  TempSrc:   PainSc: 7                  Candra R Quandra Fedorchak

## 2021-08-16 NOTE — Plan of Care (Signed)
  Problem: Clinical Measurements: Goal: Diagnostic test results will improve Outcome: Progressing   Problem: Clinical Measurements: Goal: Respiratory complications will improve Outcome: Progressing   Problem: Clinical Measurements: Goal: Cardiovascular complication will be avoided Outcome: Progressing   Problem: Elimination: Goal: Will not experience complications related to bowel motility Outcome: Progressing   Problem: Skin Integrity: Goal: Risk for impaired skin integrity will decrease Outcome: Progressing   Problem: Safety: Goal: Ability to remain free from injury will improve Outcome: Progressing

## 2021-08-16 NOTE — Progress Notes (Signed)
Physical Therapy Treatment Patient Details Name: [name] MRN: [id number] DOB: 1955/08/28 Today's Date: 08/16/2021   History of Present Illness 66 yo male admitted for removal abx spacer and L TKA reimplantation.PMH: L total knee prosthetic infection, revision, I&D with placement of antibiotic articulating spacer 04/2421, DM    PT Comments    Pt progressing well. Incr gait distance/tolerance. Pt to continue working on knee flexion /AAROM today. Will see again in pm.   Recommendations for follow up therapy are one component of a multi-disciplinary discharge planning process, led by the attending physician.  Recommendations may be updated based on patient status, additional functional criteria and insurance authorization.  Follow Up Recommendations  Follow physician's recommendations for discharge plan and follow up therapies     Assistance Recommended at Discharge PRN  Patient can return home with the following Assistance with cooking/housework;Assist for transportation;Help with stairs or ramp for entrance   Equipment Recommendations  None recommended by PT    Recommendations for Other Services       Precautions / Restrictions Precautions Precautions: Knee;Fall Restrictions Weight Bearing Restrictions: No LLE Weight Bearing: Weight bearing as tolerated     Mobility  Bed Mobility Overal bed mobility: Needs Assistance Bed Mobility: Supine to Sit, Sit to Supine     Supine to sit: Supervision Sit to supine: Supervision   General bed mobility comments: for safety. pt self assists LLE with RLE    Transfers Overall transfer level: Needs assistance Equipment used: Rolling walker (2 wheels) Transfers: Sit to/from Stand Sit to Stand: Min guard           General transfer comment: cues for hand placement and RLE position    Ambulation/Gait Ambulation/Gait assistance: Min guard, Supervision Gait Distance (Feet): 340 Feet Assistive device: Rolling walker (2  wheels) Gait Pattern/deviations: Step-to pattern, Decreased stance time - left       General Gait Details: cues for step length, good stability with RW. beginning step through gait, cues for heel strike and knee flexion LLE   Stairs             Wheelchair Mobility    Modified Rankin (Stroke Patients Only)       Balance                                            Cognition Arousal/Alertness: Awake/alert Behavior During Therapy: WFL for tasks assessed/performed Overall Cognitive Status: Within Functional Limits for tasks assessed                                          Exercises Total Joint Exercises Heel Slides: AAROM, Left, 15 reps    General Comments        Pertinent Vitals/Pain Pain Assessment Pain Assessment: 0-10 Pain Score: 4  Pain Location: left knee Pain Descriptors / Indicators: Aching, Discomfort, Pressure Pain Intervention(s): Limited activity within patient's tolerance, Monitored during session, Premedicated before session, Repositioned, Ice applied    Home Living                          Prior Function            PT Goals (current goals can now be found in the care plan section) Acute Rehab PT  Goals Patient Stated Goal: be able to move knee again PT Goal Formulation: With patient Time For Goal Achievement: 08/21/21 Potential to Achieve Goals: Good Progress towards PT goals: Progressing toward goals    Frequency    7X/week      PT Plan Current plan remains appropriate    Co-evaluation              AM-PAC PT "6 Clicks" Mobility   Outcome Measure  Help needed turning from your back to your side while in a flat bed without using bedrails?: A Little Help needed moving from lying on your back to sitting on the side of a flat bed without using bedrails?: A Little Help needed moving to and from a bed to a chair (including a wheelchair)?: A Little Help needed standing up from a  chair using your arms (e.g., wheelchair or bedside chair)?: A Little Help needed to walk in hospital room?: A Little Help needed climbing 3-5 steps with a railing? : A Little 6 Click Score: 18    End of Session Equipment Utilized During Treatment: Gait belt Activity Tolerance: Patient tolerated treatment well Patient left: in bed;with call bell/phone within reach;with family/visitor present Nurse Communication: Mobility status PT Visit Diagnosis: Other abnormalities of gait and mobility (R26.89);Difficulty in walking, not elsewhere classified (R26.2)     Time: 0930-1002 PT Time Calculation (min) (ACUTE ONLY): 32 min  Charges:  $Gait Training: 23-37 mins                     Baxter Flattery, PT  Acute Rehab Dept ([place]) [phone] Pager [phone]  08/16/2021    [facility] 08/16/2021, 1:01 PM

## 2021-08-17 LAB — GLUCOSE, CAPILLARY: Glucose-Capillary: 83 mg/dL (ref 70–99)

## 2021-08-17 MED ORDER — DOXYCYCLINE HYCLATE 100 MG PO TABS: 100.0000 mg | ORAL_TABLET | Freq: Two times a day (BID) | ORAL | 3 refills | Status: DC

## 2021-08-17 MED ORDER — METHOCARBAMOL 500 MG PO TABS: 500.0000 mg | ORAL_TABLET | Freq: Three times a day (TID) | ORAL | 1 refills | Status: DC | PRN

## 2021-08-17 MED ORDER — SENNA 8.6 MG PO TABS: 1.0000 | ORAL_TABLET | Freq: Two times a day (BID) | ORAL | 0 refills | Status: DC

## 2021-08-17 MED ORDER — OXYCODONE HCL 5 MG PO TABS: 5.0000 mg | ORAL_TABLET | Freq: Four times a day (QID) | ORAL | 0 refills | Status: DC | PRN

## 2021-08-17 MED ORDER — ASPIRIN 81 MG PO CHEW: 81.0000 mg | CHEWABLE_TABLET | Freq: Two times a day (BID) | ORAL | 0 refills | Status: DC

## 2021-08-17 MED ORDER — POLYETHYLENE GLYCOL 3350 17 G PO PACK: 17.0000 g | PACK | Freq: Every day | ORAL | 0 refills | Status: DC

## 2021-08-17 NOTE — Progress Notes (Signed)
Physical Therapy Treatment Patient Details Name: [name] MRN: [id number] DOB: 1956/01/14 Today's Date: 08/17/2021   History of Present Illness 66 yo male admitted for removal abx spacer and L TKA reimplantation.PMH: L total knee prosthetic infection, revision, I&D with placement of antibiotic articulating spacer 04/2421, DM    PT Comments    Pt having more soreness today. We discussed normalcy of this and to continue to elevate, ice, work on HEP at home as tolerated. Balance rest and mobility. Pt is ready to d/c from PT standpoint with family assist as needed.   Recommendations for follow up therapy are one component of a multi-disciplinary discharge planning process, led by the attending physician.  Recommendations may be updated based on patient status, additional functional criteria and insurance authorization.  Follow Up Recommendations  Follow physician's recommendations for discharge plan and follow up therapies     Assistance Recommended at Discharge PRN  Patient can return home with the following Assistance with cooking/housework;Assist for transportation;Help with stairs or ramp for entrance   Equipment Recommendations  None recommended by PT    Recommendations for Other Services       Precautions / Restrictions Precautions Precautions: Knee;Fall Restrictions Weight Bearing Restrictions: No LLE Weight Bearing: Weight bearing as tolerated     Mobility  Bed Mobility Overal bed mobility: Needs Assistance Bed Mobility: Sit to Supine       Sit to supine: Modified independent (Device/Increase time)        Transfers Overall transfer level: Needs assistance Equipment used: Rolling walker (2 wheels) Transfers: Sit to/from Stand Sit to Stand: Supervision, Modified independent (Device/Increase time)                Ambulation/Gait Ambulation/Gait assistance: Supervision, Modified independent (Device/Increase time) Gait Distance (Feet): 160  Feet Assistive device: Rolling walker (2 wheels) Gait Pattern/deviations: Step-to pattern, Decreased stance time - left       General Gait Details: cues for step length, good stability with RW. beginning step through gait, cues for heel strike and knee flexion LLE   Stairs             Wheelchair Mobility    Modified Rankin (Stroke Patients Only)       Balance                                            Cognition Arousal/Alertness: Awake/alert Behavior During Therapy: WFL for tasks assessed/performed Overall Cognitive Status: Within Functional Limits for tasks assessed                                          Exercises Total Joint Exercises Ankle Circles/Pumps: AROM, Both, 10 reps Knee Flexion: AAROM, Left, 5 reps, Seated    General Comments        Pertinent Vitals/Pain Pain Assessment Pain Assessment: 0-10 Pain Score: 7  Pain Location: left knee Pain Descriptors / Indicators: Aching, Discomfort, Sore Pain Intervention(s): Limited activity within patient's tolerance, Monitored during session, Premedicated before session, Repositioned, Ice applied    Home Living                          Prior Function            PT Goals (current goals  can now be found in the care plan section) Acute Rehab PT Goals Patient Stated Goal: be able to move knee again PT Goal Formulation: With patient Time For Goal Achievement: 08/21/21 Potential to Achieve Goals: Good Progress towards PT goals: Progressing toward goals    Frequency    7X/week      PT Plan Current plan remains appropriate    Co-evaluation              AM-PAC PT "6 Clicks" Mobility   Outcome Measure  Help needed turning from your back to your side while in a flat bed without using bedrails?: None Help needed moving from lying on your back to sitting on the side of a flat bed without using bedrails?: None Help needed moving to and from a bed to  a chair (including a wheelchair)?: None Help needed standing up from a chair using your arms (e.g., wheelchair or bedside chair)?: None Help needed to walk in hospital room?: A Little Help needed climbing 3-5 steps with a railing? : A Little 6 Click Score: 22    End of Session Equipment Utilized During Treatment: Gait belt Activity Tolerance: Patient tolerated treatment well Patient left: in bed;with call bell/phone within reach;with family/visitor present Nurse Communication: Mobility status PT Visit Diagnosis: Other abnormalities of gait and mobility (R26.89);Difficulty in walking, not elsewhere classified (R26.2)     Time: 5945-8592 PT Time Calculation (min) (ACUTE ONLY): 23 min  Charges:  $Gait Training: 23-37 mins                     /t    [facility] 08/17/2021, 10:11 AM

## 2021-08-17 NOTE — Progress Notes (Signed)
Patient ID: [name], male   DOB: 15-Nov-1955, 66 y.o.   MRN: [id number] Stable overall.  Can be discharged to home today.  I have stopped Eliquis and plan on 81 mg aspirin twice daily at home.  Left knee stable.

## 2021-08-17 NOTE — Plan of Care (Signed)
  Problem: Health Behavior/Discharge Planning: Goal: Ability to manage health-related needs will improve Outcome: Progressing   Problem: Clinical Measurements: Goal: Ability to maintain clinical measurements within normal limits will improve Outcome: Progressing   Problem: Activity: Goal: Risk for activity intolerance will decrease Outcome: Progressing   Problem: Pain Managment: Goal: General experience of comfort will improve Outcome: Progressing   Problem: Safety: Goal: Ability to remain free from injury will improve Outcome: Progressing   

## 2021-08-17 NOTE — Plan of Care (Signed)
Discharge instructions given to the patient including medications. ?

## 2021-08-17 NOTE — Plan of Care (Signed)
  Problem: Coping: Goal: Level of anxiety will decrease Outcome: Progressing   Problem: Pain Managment: Goal: General experience of comfort will improve Outcome: Progressing   

## 2021-08-17 NOTE — Discharge Summary (Signed)
Patient ID: [name] MRN: [id number] DOB/AGE: 06/10/1955 66 y.o.  Admit date: 08/14/2021 Discharge date: 08/17/2021  Admission Diagnoses:  Principal Problem:   Infection of total left knee replacement ([place]) Active Problems:   Status post revision of total knee replacement, left   Discharge Diagnoses:  Same  Past Medical History:  Diagnosis Date   Arthritis    Diabetes mellitus without complication ([place]) 0932   Diabetes Type II   GERD (gastroesophageal reflux disease)    diet controlled   HTN (hypertension) 10/27/2018   Hyperlipidemia    Internal hemorrhoids 2014   noted on colonoscopy   MSSA (methicillin susceptible Staphylococcus aureus) infection 09/03/2020   Osteoarthritis of both knees    Prostate cancer ([place])    prostate   Sigmoid diverticulosis 2014   Mild, noted on colonoscopy   Skin lesion 01/01/2021   Type 2 diabetes mellitus with other specified complication ([place]) 67/02/4579   Vaccine counseling 05/04/2021    Surgeries: Procedure(s): REMOVAL OF ANTIBIOTIC SPACER AND LEFT TOTAL KNEE REVISION ARTHROPLASTY on 08/14/2021   Consultants:   Discharged Condition: Improved  Hospital Course: [name] is an 66 y.o. male who was admitted 08/14/2021 for operative treatment ofInfection of total left knee replacement ([place]). Patient has severe unremitting pain that affects sleep, daily activities, and work/hobbies. After pre-op clearance the patient was taken to the operating room on 08/14/2021 and underwent  Procedure(s): REMOVAL OF ANTIBIOTIC SPACER AND LEFT TOTAL KNEE REVISION ARTHROPLASTY.    Patient was given perioperative antibiotics:  Anti-infectives (From admission, onward)    Start     Dose/Rate Route Frequency Ordered Stop   08/17/21 0000  doxycycline (VIBRA-TABS) 100 MG tablet        100 mg Oral 2 times daily 08/17/21 0910     08/15/21 1000  doxycycline (VIBRA-TABS) tablet 100 mg        100 mg Oral Every 12 hours 08/15/21 0855     08/14/21  2000  vancomycin (VANCOCIN) IVPB 1000 mg/200 mL premix        1,000 mg 200 mL/hr over 60 Minutes Intravenous Every 12 hours 08/14/21 1858 08/14/21 2059   08/14/21 1115  ceFAZolin (ANCEF) IVPB 2g/100 mL premix        2 g 200 mL/hr over 30 Minutes Intravenous On call to O.R. 08/14/21 1109 08/14/21 1456        Patient was given sequential compression devices, early ambulation, and chemoprophylaxis to prevent DVT.  Patient benefited maximally from hospital stay and there were no complications.    Recent vital signs: Patient Vitals for the past 24 hrs:  BP Temp Temp src Pulse Resp SpO2  08/17/21 0616 (!) 144/75 98.4 F (36.9 C) Oral 75 18 93 %  08/16/21 2041 (!) 143/71 98.2 F (36.8 C) Oral 64 18 95 %  08/16/21 1252 (!) 142/64 98.2 F (36.8 C) Oral 64 15 96 %     Recent laboratory studies:  Recent Labs    08/15/21 0304  WBC 9.1  HGB 10.7*  HCT 30.3*  PLT 132*  NA 136  K 4.9  CL 105  CO2 25  BUN 23  CREATININE 1.22  GLUCOSE 304*  CALCIUM 8.5*     Discharge Medications:   Allergies as of 08/17/2021   No Known Allergies      Medication List     STOP taking these medications    diclofenac 75 MG EC tablet Commonly known as: VOLTAREN   fluconazole 100 MG tablet Commonly known as:  DIFLUCAN       TAKE these medications    aspirin 81 MG chewable tablet Chew 1 tablet (81 mg total) by mouth 2 (two) times daily.   doxycycline 100 MG tablet Commonly known as: VIBRA-TABS Take 1 tablet (100 mg total) by mouth 2 (two) times daily. Take on full stomach.wear sunscreen while on this medication   escitalopram 10 MG tablet Commonly known as: LEXAPRO Take 10 mg by mouth daily.   Fish Oil Maximum Strength 1200 MG Caps Take 1,200 mg by mouth daily.   fluorouracil 5 % cream Commonly known as: EFUDEX Apply 1 application. topically 2 (two) times daily as needed (cancer spots).   FreeStyle Libre 14 Day Sensor Misc SMARTSIG:Topical Every 10 Days   insulin aspart 100  UNIT/ML injection Commonly known as: novoLOG Inject 10-20 Units into the skin 3 (three) times daily with meals. Per sliding scale   losartan-hydrochlorothiazide 50-12.5 MG tablet Commonly known as: HYZAAR TAKE 1 TABLET BY MOUTH EVERY DAY   methocarbamol 500 MG tablet Commonly known as: ROBAXIN Take 1 tablet (500 mg total) by mouth every 8 (eight) hours as needed for muscle spasms.   ondansetron 4 MG tablet Commonly known as: Zofran Take 1 tablet (4 mg total) by mouth every 8 (eight) hours as needed for nausea or vomiting.   oxyCODONE 5 MG immediate release tablet Commonly known as: Oxy IR/ROXICODONE Take 1-2 tablets (5-10 mg total) by mouth every 6 (six) hours as needed for moderate pain (pain score 4-6). What changed: when to take this   polyethylene glycol 17 g packet Commonly known as: MIRALAX / GLYCOLAX Take 17 g by mouth daily.   rosuvastatin 20 MG tablet Commonly known as: CRESTOR TAKE 1 TABLET BY MOUTH EVERY DAY   senna 8.6 MG Tabs tablet Commonly known as: SENOKOT Take 1 tablet (8.6 mg total) by mouth 2 (two) times daily.   tiZANidine 4 MG tablet Commonly known as: ZANAFLEX Take 1 tablet (4 mg total) by mouth every 8 (eight) hours as needed for muscle spasms.   Tyler Aas FlexTouch 100 UNIT/ML FlexTouch Pen Generic drug: insulin degludec Inject 80 Units into the skin daily. Pt takes in the am               Durable Medical Equipment  (From admission, onward)           Start     Ordered   08/14/21 1859  DME 3 n 1  Once        08/14/21 1858   08/14/21 1859  DME Walker rolling  Once       Question Answer Comment  Walker: With 5 Inch Wheels   Patient needs a walker to treat with the following condition Status post revision of total knee replacement, left      08/14/21 1858            Diagnostic Studies: DG Knee Left Port  Result Date: 08/14/2021 CLINICAL DATA:  Status post removal of antibiotic spacer and status post left total knee revision  arthroplasty. EXAM: PORTABLE LEFT KNEE - 1-2 VIEW COMPARISON:  Left knee radiographs 12/31/2020 FINDINGS: Interval removal of the prior total left knee arthroplasty hardware and interval placement of new total left knee arthroplasty hardware with longer femoral and tibial stems. No perihardware lucency is seen to indicate hardware failure or loosening. Expected postoperative changes including soft tissue swelling and mild subcutaneous air. Anterior surgical skin staples. There are tiny densities overlying the posterior knee joint on lateral view,  presumably prior antibiotic beads. No acute fracture or dislocation. IMPRESSION: Interval revision of total left knee arthroplasty without evidence of hardware failure. Electronically Signed   By: Yvonne Kendall M.D.   On: 08/14/2021 18:40    Disposition: Discharge disposition: 01-Home or Self Care            Signed: Mcarthur Rossetti 08/17/2021, 9:11 AM

## 2021-08-18 ENCOUNTER — Encounter (HOSPITAL_COMMUNITY): Payer: Self-pay | Admitting: Orthopaedic Surgery

## 2021-08-21 ENCOUNTER — Telehealth: Payer: Self-pay | Admitting: Orthopaedic Surgery

## 2021-08-21 NOTE — Telephone Encounter (Signed)
Marcella Dubs (PT) from [facility] called requesting verbal orders for home health pt 1 wk 1, 3 wk 2, and then orders for outpatient physical therapy. Please call Doren Custard on secure line at [phone]. If unable to answer please leave detailed message.

## 2021-08-21 NOTE — Telephone Encounter (Signed)
Called and gave verbal ok 

## 2021-08-24 ENCOUNTER — Other Ambulatory Visit: Payer: Self-pay | Admitting: Orthopaedic Surgery

## 2021-08-24 ENCOUNTER — Telehealth: Payer: Self-pay

## 2021-08-24 MED ORDER — DICLOFENAC SODIUM 75 MG PO TBEC: 75.0000 mg | DELAYED_RELEASE_TABLET | Freq: Two times a day (BID) | ORAL | 3 refills | Status: DC | PRN

## 2021-08-24 NOTE — Telephone Encounter (Signed)
Wants Rx refill of diclofenac '75mg'$ , but I don't see where we have given this to him [facility]

## 2021-08-27 ENCOUNTER — Encounter: Payer: Self-pay | Admitting: Orthopaedic Surgery

## 2021-08-27 ENCOUNTER — Ambulatory Visit (INDEPENDENT_AMBULATORY_CARE_PROVIDER_SITE_OTHER): Payer: HMO | Admitting: Orthopaedic Surgery

## 2021-08-27 DIAGNOSIS — Z96652 Presence of left artificial knee joint: Secondary | ICD-10-CM

## 2021-08-27 MED ORDER — OXYCODONE HCL 5 MG PO TABS: 5.0000 mg | ORAL_TABLET | Freq: Four times a day (QID) | ORAL | 0 refills | Status: DC | PRN

## 2021-08-27 NOTE — Progress Notes (Signed)
The patient is a 66 year old gentleman who is now 2 weeks status post revision arthroplasty which was a two-stage revision of his left knee.  He has had an infection with MRSA.  We performed an excision arthroplasty with placement of an antibiotic spacer.  He was then on 6 weeks of IV antibiotics.  After several weeks of being off antibiotics cultures were negative from the knee and his infectious parameter labs were normalized.  We took him to the operating room 2 weeks ago and performed a revision arthroplasty of that knee.  He is doing well overall.  He is on doxycycline now and we will probably keep him on that for 6 months to a year.  The MRSA was sensitive to doxycycline.  The staples were removed and Steri-Strips applied to his left knee incision.  He said he has been able to flex his knee to about 88 degrees and is very pleased overall.  He does have chronic lymphedema in that leg.  I will send him to [facility] for compressive garment for that leg.  He will wait a few more weeks before using his lymphedema device.  I did refill his pain medication.  I would like to see him back in 4 weeks to see how he is doing overall.  He would like to have outpatient physical therapy in [place] with the current facility there which is reasonable we will work on getting that scheduled as well.  No x-rays needed at his next visit.

## 2021-08-28 ENCOUNTER — Telehealth: Payer: Self-pay

## 2021-08-28 NOTE — Telephone Encounter (Signed)
Patient's wife called stating patient had his knee replaced a couple of weeks ago. Patient was prescribed doxycyline for 6 months to a year. Patient wife wants to make sure that you are ok with this treatment plan.

## 2021-08-31 ENCOUNTER — Encounter (HOSPITAL_COMMUNITY): Payer: Self-pay | Admitting: Physical Therapy

## 2021-08-31 ENCOUNTER — Ambulatory Visit (HOSPITAL_COMMUNITY): Payer: HMO | Attending: Orthopaedic Surgery | Admitting: Physical Therapy

## 2021-08-31 DIAGNOSIS — I10 Essential (primary) hypertension: Secondary | ICD-10-CM | POA: Insufficient documentation

## 2021-08-31 DIAGNOSIS — C61 Malignant neoplasm of prostate: Secondary | ICD-10-CM | POA: Insufficient documentation

## 2021-08-31 DIAGNOSIS — M25662 Stiffness of left knee, not elsewhere classified: Secondary | ICD-10-CM | POA: Insufficient documentation

## 2021-08-31 DIAGNOSIS — Z96652 Presence of left artificial knee joint: Secondary | ICD-10-CM | POA: Diagnosis present

## 2021-08-31 DIAGNOSIS — E119 Type 2 diabetes mellitus without complications: Secondary | ICD-10-CM | POA: Insufficient documentation

## 2021-08-31 DIAGNOSIS — M6281 Muscle weakness (generalized): Secondary | ICD-10-CM | POA: Diagnosis not present

## 2021-08-31 DIAGNOSIS — E785 Hyperlipidemia, unspecified: Secondary | ICD-10-CM | POA: Diagnosis not present

## 2021-08-31 DIAGNOSIS — R2689 Other abnormalities of gait and mobility: Secondary | ICD-10-CM | POA: Diagnosis not present

## 2021-08-31 DIAGNOSIS — R29898 Other symptoms and signs involving the musculoskeletal system: Secondary | ICD-10-CM

## 2021-08-31 DIAGNOSIS — I890 Lymphedema, not elsewhere classified: Secondary | ICD-10-CM | POA: Diagnosis not present

## 2021-08-31 DIAGNOSIS — M25562 Pain in left knee: Secondary | ICD-10-CM | POA: Diagnosis not present

## 2021-08-31 NOTE — Therapy (Signed)
OUTPATIENT PHYSICAL THERAPY LOWER EXTREMITY EVALUATION   Patient Name: [name] MRN: [id number] DOB:1955-12-03, 66 y.o., male Today's Date: 08/31/2021   PT End of Session - 08/31/21 0926     Visit Number 1    Number of Visits 12    Date for PT Re-Evaluation 10/12/21    Authorization Type [employer] (visits medicare guidelines, No auth)    Progress Note Due on Visit 10    PT Start Time 0927   arrives late   PT Stop Time 0958    PT Time Calculation (min) 31 min    Activity Tolerance Patient tolerated treatment well    Behavior During Therapy [facility] for tasks assessed/performed             Past Medical History:  Diagnosis Date   Arthritis    Diabetes mellitus without complication ([place]) 3086   Diabetes Type II   GERD (gastroesophageal reflux disease)    diet controlled   HTN (hypertension) 10/27/2018   Hyperlipidemia    Internal hemorrhoids 2014   noted on colonoscopy   MSSA (methicillin susceptible Staphylococcus aureus) infection 09/03/2020   Osteoarthritis of both knees    Prostate cancer ([place])    prostate   Sigmoid diverticulosis 2014   Mild, noted on colonoscopy   Skin lesion 01/01/2021   Type 2 diabetes mellitus with other specified complication ([place]) 57/84/6962   Vaccine counseling 05/04/2021   Past Surgical History:  Procedure Laterality Date   CARPAL TUNNEL RELEASE Right 02/22/2019   Procedure: CARPAL TUNNEL RELEASE;  Surgeon: Carole Civil, MD;  Location: [facility];  Service: Orthopedics;  Laterality: Right;   COLONOSCOPY N/A 09/13/2012   Procedure: COLONOSCOPY;  Surgeon: Danie Binder, MD;  Location: [facility];  Service: Endoscopy;  Laterality: N/A;  9:30 AM   CYSTOSCOPY WITH URETHRAL DILATATION N/A 06/02/2020   Procedure: CYSTOSCOPY WITH URETHRAL DILATATION;  Surgeon: [name], MD;  Location: [facility];  Service: Urology;  Laterality: N/A;  ONLY NEEDS 30 MIN   EXCISIONAL TOTAL KNEE ARTHROPLASTY WITH ANTIBIOTIC SPACERS Left  05/15/2021   Procedure: LEFT TOTAL KNEE IRRIGATION AND DEBRIDEMENT, EXCISION LEFT TOTAL KNEE ARTHROPLASTY WITH ANTIBIOTIC SPACERS;  Surgeon: Mcarthur Rossetti, MD;  Location: [facility];  Service: Orthopedics;  Laterality: Left;   HERNIA REPAIR     20 years ago   I & D KNEE WITH POLY EXCHANGE Left 08/14/2020   Procedure: IRRIGATION AND DEBRIDEMENT LEFT KNEE with synovectomy WITH POLY EXCHANGE;  Surgeon: Mcarthur Rossetti, MD;  Location: [place];  Service: Orthopedics;  Laterality: Left;   KNEE ARTHROSCOPY Left    15 years ago   LYMPHADENECTOMY Bilateral 05/30/2017   Procedure: LYMPHADENECTOMY, PELVIC EXTENDED;  Surgeon: [name], MD;  Location: [facility];  Service: Urology;  Laterality: Bilateral;   ROBOT ASSISTED LAPAROSCOPIC RADICAL PROSTATECTOMY N/A 05/30/2017   Procedure: XI ROBOTIC ASSISTED LAPAROSCOPIC RADICAL PROSTATECTOMY LEVEL 3;  Surgeon: [name], MD;  Location: [facility];  Service: Urology;  Laterality: N/A;   SHOULDER ARTHROSCOPY Right    15 years ago   TOTAL KNEE ARTHROPLASTY Left 03/21/2019   Procedure: LEFT TOTAL KNEE ARTHROPLASTY;  Surgeon: Mcarthur Rossetti, MD;  Location: [facility];  Service: Orthopedics;  Laterality: Left;   TOTAL KNEE REVISION Left 08/14/2021   Procedure: REMOVAL OF ANTIBIOTIC SPACER AND LEFT TOTAL KNEE REVISION ARTHROPLASTY;  Surgeon: Mcarthur Rossetti, MD;  Location: [facility];  Service: Orthopedics;  Laterality: Left;   Patient Active Problem List   Diagnosis Date Noted  Status post revision of total replacement of left knee 08/27/2021   Status post revision of total knee replacement, left 08/14/2021   PICC (peripherally inserted central catheter) in place 06/09/2021   Vaccine counseling 05/04/2021   Skin lesion 01/01/2021   Type 2 diabetes mellitus with other specified complication ([place]) 25/95/6387   Nausea and vomiting 09/11/2020   MSSA (methicillin susceptible Staphylococcus aureus) infection 09/03/2020   Effusion, left knee  08/14/2020   Infection of total left knee replacement ([place]) 08/14/2020   Status post total left knee replacement 03/21/2019   S/P carpal tunnel release right 02/22/19 03/08/2019   Carpal tunnel syndrome of right wrist    HTN (hypertension) 10/27/2018   Prostate cancer ([place]) 05/30/2017   Malignant neoplasm of prostate ([place]) 04/28/2017   Chronic pain of both knees 02/21/2017   Unilateral primary osteoarthritis, left knee 02/21/2017   Unilateral primary osteoarthritis, right knee 02/21/2017    PCP: [name] MD  REFERRING PROVIDER: Mcarthur Rossetti, MD   REFERRING DIAG: [phone] (ICD-10-CM) - Status post revision of total replacement of left knee   THERAPY DIAG:  Left knee pain, unspecified chronicity  Muscle weakness (generalized)  Other abnormalities of gait and mobility  Other symptoms and signs involving the musculoskeletal system  Stiffness of left knee, not elsewhere classified  Rationale for Evaluation and Treatment Rehabilitation  ONSET DATE: 08/14/21  SUBJECTIVE:   SUBJECTIVE STATEMENT: He has been doing alright since. He has lymphedema and by end of day leg can be sore. He was given script for compression garments.   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 No  PRECAUTIONS: None  WEIGHT BEARING RESTRICTIONS No  FALLS:  Has patient fallen in last 6 months? No  LIVING ENVIRONMENT: Lives with: lives with their spouse Lives in: House/apartment Stairs: Yes: External: 3-4 steps; on right going up Has following equipment at home: Single point cane, Environmental consultant - 2 wheeled, and Crutches  OCCUPATION: Theme park manager  PLOF: Independent  PATIENT GOALS Get knee back to being right   OBJECTIVE:  Observation: LLE lymphedema   PATIENT SURVEYS:  FOTO complete next session  COGNITION:  Overall cognitive status:  Within functional limits for tasks assessed     SENSATION: WFL    POSTURE: No Significant postural limitations  PALPATION: Slight tenderness throughout LLE  LOWER EXTREMITY ROM:  Active ROM Right eval Left eval  Hip flexion    Hip extension    Hip abduction    Hip adduction    Hip internal rotation    Hip external rotation    Knee flexion 117 68  Knee extension 0 0  Ankle dorsiflexion    Ankle plantarflexion    Ankle inversion    Ankle eversion     (Blank rows = not tested)  LOWER EXTREMITY MMT:  MMT Right eval Left eval  Hip flexion 5/5 4+/5  Hip extension    Hip abduction    Hip adduction    Hip internal rotation    Hip external rotation    Knee flexion 5/5 4+/5  Knee extension 5/5 4+/5  Ankle dorsiflexion 5/5 5/5  Ankle plantarflexion    Ankle inversion    Ankle eversion     (Blank rows = not tested)    FUNCTIONAL TESTS:  5 times sit to stand: 11.5 seconds, relies on RLE with L knee in extension 2 minute walk  test: 400 feet Stairs: able to do with alternating pattern with UE support, decreased knee flexion causing limitation  GAIT: Distance walked: 400 Assistive device utilized: None Level of assistance: Complete Independence Comments: 2MWT. Limited knee flexion, antalgic on L    TODAY'S TREATMENT: 08/31/21 Heel slides 10 x 5-10 second holds Heel slides seated 10 x 5-10 second holds SLR 2x 10 STS 2x 10   PATIENT EDUCATION:  Education details: Patient educated on exam findings, POC, scope of PT, HEP, avoiding compensation, and getting compression garments. Person educated: Patient Education method: Explanation, Demonstration, and Handouts Education comprehension: verbalized understanding, returned demonstration, verbal cues required, and tactile cues required  HOME EXERCISE PROGRAM: 6/12/23Access Code: [email or web address] - Supine Heel Slide with Strap  - 5 x daily - 7 x weekly - 2 sets - 10 reps - 5-10 second hold - Seated Heel Slide  - 5 x  daily - 7 x weekly - 2 sets - 10 reps - 5-10 second hold - Active Straight Leg Raise with Quad Set  - 3 x daily - 7 x weekly - 3 sets - 10 reps - Sit to Stand with Arms Crossed  - 3 x daily - 7 x weekly - 2 sets - 10 reps  ASSESSMENT:  CLINICAL IMPRESSION: Patient a 66 y.o. y.o. male who was seen today for physical therapy evaluation and treatment for s/p revision of L TKA on 08/14/21. Patient presents with pain limited deficits in L knee strength, ROM, endurance, activity tolerance, gait, balance, and functional mobility with ADL. Patient is having to modify and restrict ADL as indicated by outcome measure score as well as subjective information and objective measures which is affecting overall participation. Patient will benefit from skilled physical therapy in order to improve function and reduce impairment.  OBJECTIVE IMPAIRMENTS Abnormal gait, decreased activity tolerance, decreased mobility, difficulty walking, decreased ROM, decreased strength, increased edema, impaired flexibility, improper body mechanics, and pain.   ACTIVITY LIMITATIONS lifting, bending, standing, squatting, sleeping, stairs, transfers, locomotion level, and caring for others  PARTICIPATION LIMITATIONS: meal prep, cleaning, shopping, community activity, and yard work  PERSONAL FACTORS Past/current experiences, Time since onset of injury/illness/exacerbation, and 3+ comorbidities: chronic lymphedema, infection with MRSA causing 2 part revision of TKA, Adenocarcinoma of prostate with lymphectomy of 38 nodes, DM, HTN, HLD  are also affecting patient's functional outcome.   REHAB POTENTIAL: Good  CLINICAL DECISION MAKING: Stable/uncomplicated  EVALUATION COMPLEXITY: Low   GOALS: Goals reviewed with patient? Yes  SHORT TERM GOALS: Target date: 09/21/2021  Patient will be independent with HEP in order to improve functional outcomes. Baseline:  Goal status: INITIAL  2.  Patient will report at least 25% improvement in  symptoms for improved quality of life. Baseline:  Goal status: INITIAL   LONG TERM GOALS: Target date: 10/12/2021  Patient will report at least 75% improvement in symptoms for improved quality of life. Baseline:  Goal status: INITIAL  2.Patient will be able to complete 5x STS in under 11.4 seconds without compensation in order to demonstrate improved functional strength. Baseline: 11.5 seconds, relies on RLE with L knee in extension Goal status: INITIAL  3.  Patient will be able to navigate stairs with reciprocal pattern without compensation in order to demonstrate improved LE strength. Baseline: see above Goal status: INITIAL  4.  Patient will be able to ambulate at least 450 feet in 2MWT in order to demonstrate improved tolerance to activity. Baseline: 400 feet Goal status: INITIAL  5.  Patient will improve ROM for  left knee extension/flexion to 0-110 degrees to improve squatting, and other functional mobility. Baseline: 0-68 Goal status: INITIAL    PLAN: PT FREQUENCY: 2x/week  PT DURATION: 6 weeks  PLANNED INTERVENTIONS: Therapeutic exercises, Therapeutic activity, Neuromuscular re-education, Balance training, Gait training, Patient/Family education, Joint manipulation, Joint mobilization, Stair training, Orthotic/Fit training, DME instructions, Aquatic Therapy, Dry Needling, Electrical stimulation, Spinal manipulation, Spinal mobilization, Cryotherapy, Moist heat, Compression bandaging, scar mobilization, Splintting, Taping, Traction, Ultrasound, Ionotophoresis '4mg'$ /ml Dexamethasone, and Manual therapy   PLAN FOR NEXT SESSION: complete FOTO, L knee mobility and strength and progress as tolerated   Vianne Bulls Bricelyn Freestone, PT 08/31/2021, 10:00 AM

## 2021-09-02 ENCOUNTER — Encounter (HOSPITAL_COMMUNITY): Payer: Self-pay | Admitting: Orthopaedic Surgery

## 2021-09-02 ENCOUNTER — Ambulatory Visit (HOSPITAL_COMMUNITY): Payer: HMO | Attending: Orthopaedic Surgery | Admitting: Physical Therapy

## 2021-09-02 DIAGNOSIS — M25662 Stiffness of left knee, not elsewhere classified: Secondary | ICD-10-CM | POA: Insufficient documentation

## 2021-09-02 DIAGNOSIS — R29898 Other symptoms and signs involving the musculoskeletal system: Secondary | ICD-10-CM | POA: Insufficient documentation

## 2021-09-02 DIAGNOSIS — M25562 Pain in left knee: Secondary | ICD-10-CM | POA: Insufficient documentation

## 2021-09-02 DIAGNOSIS — R2689 Other abnormalities of gait and mobility: Secondary | ICD-10-CM | POA: Diagnosis present

## 2021-09-02 DIAGNOSIS — M6281 Muscle weakness (generalized): Secondary | ICD-10-CM | POA: Insufficient documentation

## 2021-09-02 NOTE — Therapy (Signed)
OUTPATIENT PHYSICAL THERAPY TREATMENT  Patient Name: [name] MRN: [id number] DOB:Jan 12, 1956, 66 y.o., male Today's Date: 09/02/2021   PT End of Session - 09/02/21 1008     Visit Number 2    Number of Visits 12    Date for PT Re-Evaluation 10/12/21    Authorization Type [employer] (visits medicare guidelines, No auth)    Progress Note Due on Visit 10    PT Start Time 1002    PT Stop Time 8299    PT Time Calculation (min) 43 min    Activity Tolerance Patient tolerated treatment well    Behavior During Therapy [facility] for tasks assessed/performed             Past Medical History:  Diagnosis Date   Arthritis    Diabetes mellitus without complication ([place]) 3716   Diabetes Type II   GERD (gastroesophageal reflux disease)    diet controlled   HTN (hypertension) 10/27/2018   Hyperlipidemia    Internal hemorrhoids 2014   noted on colonoscopy   MSSA (methicillin susceptible Staphylococcus aureus) infection 09/03/2020   Osteoarthritis of both knees    Prostate cancer ([place])    prostate   Sigmoid diverticulosis 2014   Mild, noted on colonoscopy   Skin lesion 01/01/2021   Type 2 diabetes mellitus with other specified complication ([place]) 96/78/9381   Vaccine counseling 05/04/2021   Past Surgical History:  Procedure Laterality Date   CARPAL TUNNEL RELEASE Right 02/22/2019   Procedure: CARPAL TUNNEL RELEASE;  Surgeon: Carole Civil, MD;  Location: [facility];  Service: Orthopedics;  Laterality: Right;   COLONOSCOPY N/A 09/13/2012   Procedure: COLONOSCOPY;  Surgeon: Danie Binder, MD;  Location: [facility];  Service: Endoscopy;  Laterality: N/A;  9:30 AM   CYSTOSCOPY WITH URETHRAL DILATATION N/A 06/02/2020   Procedure: CYSTOSCOPY WITH URETHRAL DILATATION;  Surgeon: [name], MD;  Location: [facility];  Service: Urology;  Laterality: N/A;  ONLY NEEDS 30 MIN   EXCISIONAL TOTAL KNEE ARTHROPLASTY WITH ANTIBIOTIC SPACERS Left 05/15/2021   Procedure: LEFT TOTAL KNEE  IRRIGATION AND DEBRIDEMENT, EXCISION LEFT TOTAL KNEE ARTHROPLASTY WITH ANTIBIOTIC SPACERS;  Surgeon: Mcarthur Rossetti, MD;  Location: [facility];  Service: Orthopedics;  Laterality: Left;   HERNIA REPAIR     20 years ago   I & D KNEE WITH POLY EXCHANGE Left 08/14/2020   Procedure: IRRIGATION AND DEBRIDEMENT LEFT KNEE with synovectomy WITH POLY EXCHANGE;  Surgeon: Mcarthur Rossetti, MD;  Location: [facility];  Service: Orthopedics;  Laterality: Left;   KNEE ARTHROSCOPY Left    15 years ago   LYMPHADENECTOMY Bilateral 05/30/2017   Procedure: LYMPHADENECTOMY, PELVIC EXTENDED;  Surgeon: [name], MD;  Location: [facility];  Service: Urology;  Laterality: Bilateral;   ROBOT ASSISTED LAPAROSCOPIC RADICAL PROSTATECTOMY N/A 05/30/2017   Procedure: XI ROBOTIC ASSISTED LAPAROSCOPIC RADICAL PROSTATECTOMY LEVEL 3;  Surgeon: [name], MD;  Location: [facility];  Service: Urology;  Laterality: N/A;   SHOULDER ARTHROSCOPY Right    15 years ago   TOTAL KNEE ARTHROPLASTY Left 03/21/2019   Procedure: LEFT TOTAL KNEE ARTHROPLASTY;  Surgeon: Mcarthur Rossetti, MD;  Location: [facility];  Service: Orthopedics;  Laterality: Left;   TOTAL KNEE REVISION Left 08/14/2021   Procedure: REMOVAL OF ANTIBIOTIC SPACER AND LEFT TOTAL KNEE REVISION ARTHROPLASTY;  Surgeon: Mcarthur Rossetti, MD;  Location: [facility];  Service: Orthopedics;  Laterality: Left;   Patient Active Problem List   Diagnosis Date Noted   Status post revision of  total replacement of left knee 08/27/2021   Status post revision of total knee replacement, left 08/14/2021   PICC (peripherally inserted central catheter) in place 06/09/2021   Vaccine counseling 05/04/2021   Skin lesion 01/01/2021   Type 2 diabetes mellitus with other specified complication ([place]) 58/83/2549   Nausea and vomiting 09/11/2020   MSSA (methicillin susceptible Staphylococcus aureus) infection 09/03/2020   Effusion, left knee 08/14/2020   Infection of total left knee  replacement ([place]) 08/14/2020   Status post total left knee replacement 03/21/2019   S/P carpal tunnel release right 02/22/19 03/08/2019   Carpal tunnel syndrome of right wrist    HTN (hypertension) 10/27/2018   Prostate cancer ([place]) 05/30/2017   Malignant neoplasm of prostate ([place]) 04/28/2017   Chronic pain of both knees 02/21/2017   Unilateral primary osteoarthritis, left knee 02/21/2017   Unilateral primary osteoarthritis, right knee 02/21/2017    PCP: [name] MD  REFERRING PROVIDER: Mcarthur Rossetti, MD   REFERRING DIAG: [phone] (ICD-10-CM) - Status post revision of total replacement of left knee   THERAPY DIAG:  Left knee pain, unspecified chronicity  Muscle weakness (generalized)  Other abnormalities of gait and mobility  Rationale for Evaluation and Treatment Rehabilitation  ONSET DATE: 08/14/21  SUBJECTIVE:   SUBJECTIVE STATEMENT: Pt reports his MD gave him a script for new compression garments but hasn't gotten them yet.  Noted increased edema in Lt LE as compared to Rt.  Pt has lymphedema in his Lt LE and discussed importance of compression.  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 No  PRECAUTIONS: None  WEIGHT BEARING RESTRICTIONS No  FALLS:  Has patient fallen in last 6 months? No  LIVING ENVIRONMENT: Lives with: lives with their spouse Lives in: House/apartment Stairs: Yes: External: 3-4 steps; on right going up Has following equipment at home: Single point cane, Environmental consultant - 2 wheeled, and Crutches  OCCUPATION: Theme park manager  PLOF: Independent  PATIENT GOALS Get knee back to being right   OBJECTIVE:  Observation: LLE lymphedema   PATIENT SURVEYS:  FOTO  53% functional status  COGNITION:  Overall cognitive status: Within functional  limits for tasks assessed     SENSATION: WFL  POSTURE: No Significant postural limitations  PALPATION: Slight tenderness throughout LLE  LOWER EXTREMITY ROM:  Active ROM Right eval Left eval Left 09/02/21  Knee flexion 117 68 78  Knee extension 0 0 0    LOWER EXTREMITY MMT:  MMT Right eval Left eval  Hip flexion 5/5 4+/5  Hip extension    Hip abduction    Hip adduction    Hip internal rotation    Hip external rotation    Knee flexion 5/5 4+/5  Knee extension 5/5 4+/5  Ankle dorsiflexion 5/5 5/5  Ankle plantarflexion    Ankle inversion    Ankle eversion     (Blank rows = not tested)  Left Lower Extremity Lymphedema Final on 05/21/19 09/02/21  20 cm Proximal to Suprapatella  54 cm   56.5 05/02/19 57.5 cm  10 cm Proximal to Suprapatella  49 cm   52.3 05/02/19 52.5 cm  At Midpatella/Popliteal Crease  43.7 cm   44.2 05/02/19 47 cm  30 cm Proximal to Floor at Lateral Plantar Foot  36.2 cm   38.2 05/02/19 43.3 cm  20  cm Proximal to Floor at Lateral Plantar Foot  27 cm   28.2 05/02/19 36.2 cm  10 cm Proximal to Floor at Lateral Malleoli  25.3 cm   27.3 05/02/19 31 cm  Circumference of ankle/heel  34 cm.   35.5 05/02/19 37 cm  5 cm Proximal to 1st MTP Joint  22.8 cm  23.7 05/02/19 24 cm  Across MTP Joint  23 cm   23.8 05/02/19 23.8 cm        FUNCTIONAL TESTS:  5 times sit to stand: 11.5 seconds, relies on RLE with L knee in extension 2 minute walk test: 400 feet Stairs: able to do with alternating pattern with UE support, decreased knee flexion causing limitation  GAIT: Distance walked: 400 Assistive device utilized: None Level of assistance: Complete Independence Comments: 2MWT. Limited knee flexion, antalgic on L    TODAY'S TREATMENT: 09/02/21 Bike seat 18 5 minutes rocking Standing: knee flexion stretch onto 12" step 10X10"  Lt knee flexoin 10X  Heelraises 10X Supine:  heelslides 10X Manual:  manual decongestive techniques/retro massage for Lt  LE Completed FOTO  08/31/21 Heel slides 10 x 5-10 second holds Heel slides seated 10 x 5-10 second holds SLR 2x 10 STS 2x 10   PATIENT EDUCATION:  Education details: 6/14:  Lymphedema education and how untreated lymphedema leads to cellulitis and infection; reduction is through bandaging; imperative to wear thigh high compression;  evaluation: Patient educated on exam findings, POC, scope of PT, HEP, avoiding compensation, and getting compression garments. Person educated: Patient Education method: Explanation, Demonstration, and Handouts Education comprehension: verbalized understanding, returned demonstration, verbal cues required, and tactile cues required  HOME EXERCISE PROGRAM: 6/12/23Access Code: [email or web address] - Supine Heel Slide with Strap  - 5 x daily - 7 x weekly - 2 sets - 10 reps - 5-10 second hold - Seated Heel Slide  - 5 x daily - 7 x weekly - 2 sets - 10 reps - 5-10 second hold - Active Straight Leg Raise with Quad Set  - 3 x daily - 7 x weekly - 3 sets - 10 reps - Sit to Stand with Arms Crossed  - 3 x daily - 7 x weekly - 2 sets - 10 reps  ASSESSMENT:  CLINICAL IMPRESSION: Goals reviewed as well as HEP.  FOTO completed per POC.  Pt received lymphedema treatment at this clinic in 2021 following his first Lt TKA.  He is now returning after having a revision of L TKA/infection on 08/14/21 with noted increase of induration/edema in Lt LE.  Circumferential measurements taken today of Lt LE with drastic increased in edema measuring 3-9 cm in places.  Educated patient on importance of controlling this edema in order to reduce risk of getting cellulitis and another infection in operative LE.  Pt does not wish to return to bandaging despite this information.  Urged pt to obtain thigh high garments for LT LE to maintain current volume and help to improve ROM and function. Educated that daily wear of compression is the only way to maintain and reduce increase in volume.  Manual completed this  session to help reduce induration and edema. Decongestive techniques along with retro massage completed in supine.  AROM measured following with improvement of 10 degrees as compared to last visit.   Patient will benefit from skilled physical therapy in order to improve function and reduce impairment.  OBJECTIVE IMPAIRMENTS Abnormal gait, decreased activity tolerance, decreased mobility, difficulty walking, decreased ROM, decreased strength, increased edema, impaired flexibility, improper  body mechanics, and pain.   ACTIVITY LIMITATIONS lifting, bending, standing, squatting, sleeping, stairs, transfers, locomotion level, and caring for others  PARTICIPATION LIMITATIONS: meal prep, cleaning, shopping, community activity, and yard work  PERSONAL FACTORS Past/current experiences, Time since onset of injury/illness/exacerbation, and 3+ comorbidities: chronic lymphedema, infection with MRSA causing 2 part revision of TKA, Adenocarcinoma of prostate with lymphectomy of 38 nodes, DM, HTN, HLD  are also affecting patient's functional outcome.   REHAB POTENTIAL: Good  CLINICAL DECISION MAKING: Stable/uncomplicated  EVALUATION COMPLEXITY: Low   GOALS: Goals reviewed with patient? Yes  SHORT TERM GOALS: Target date: 09/21/2021  Patient will be independent with HEP in order to improve functional outcomes. Baseline:  Goal status: IN PROGRESS  2.  Patient will report at least 25% improvement in symptoms for improved quality of life. Baseline:  Goal status: IN PROGRESS   LONG TERM GOALS: Target date: 10/12/2021  Patient will report at least 75% improvement in symptoms for improved quality of life. Baseline:  Goal status: IN PROGRESS  2.Patient will be able to complete 5x STS in under 11.4 seconds without compensation in order to demonstrate improved functional strength. Baseline: 11.5 seconds, relies on RLE with L knee in extension Goal status: IN PROGRESS  3.  Patient will be able to navigate  stairs with reciprocal pattern without compensation in order to demonstrate improved LE strength. Baseline: see above Goal status: IN PROGRESS  4.  Patient will be able to ambulate at least 450 feet in 2MWT in order to demonstrate improved tolerance to activity. Baseline: 400 feet Goal status: IN PROGRESS  5.  Patient will improve ROM for left knee extension/flexion to 0-110 degrees to improve squatting, and other functional mobility. Baseline: 0-68 Goal status: IN PROGRESS    PLAN: PT FREQUENCY: 2x/week  PT DURATION: 6 weeks  PLANNED INTERVENTIONS: Therapeutic exercises, Therapeutic activity, Neuromuscular re-education, Balance training, Gait training, Patient/Family education, Joint manipulation, Joint mobilization, Stair training, Orthotic/Fit training, DME instructions, Aquatic Therapy, Dry Needling, Electrical stimulation, Spinal manipulation, Spinal mobilization, Cryotherapy, Moist heat, Compression bandaging, scar mobilization, Splintting, Taping, Traction, Ultrasound, Ionotophoresis '4mg'$ /ml Dexamethasone, and Manual therapy   PLAN FOR NEXT SESSION:  F/U if purchased/wearing thigh high compression garment.  Continue to improved Lt knee mobility and strength, manual to reduce edema and education.  Progress as tolerated   Teena Irani, PTA/CLT [place] Ph: [phone]  Teena Irani, PTA 09/02/2021, 2:57 PM

## 2021-09-07 ENCOUNTER — Ambulatory Visit (HOSPITAL_COMMUNITY): Payer: HMO | Attending: Orthopaedic Surgery

## 2021-09-07 DIAGNOSIS — M25662 Stiffness of left knee, not elsewhere classified: Secondary | ICD-10-CM

## 2021-09-07 DIAGNOSIS — R29898 Other symptoms and signs involving the musculoskeletal system: Secondary | ICD-10-CM

## 2021-09-07 DIAGNOSIS — M25562 Pain in left knee: Secondary | ICD-10-CM | POA: Insufficient documentation

## 2021-09-07 DIAGNOSIS — M6281 Muscle weakness (generalized): Secondary | ICD-10-CM | POA: Insufficient documentation

## 2021-09-07 DIAGNOSIS — R269 Unspecified abnormalities of gait and mobility: Secondary | ICD-10-CM | POA: Diagnosis not present

## 2021-09-07 DIAGNOSIS — Z96652 Presence of left artificial knee joint: Secondary | ICD-10-CM | POA: Diagnosis present

## 2021-09-07 DIAGNOSIS — R2689 Other abnormalities of gait and mobility: Secondary | ICD-10-CM

## 2021-09-07 NOTE — Telephone Encounter (Signed)
Please advise 

## 2021-09-07 NOTE — Therapy (Signed)
OUTPATIENT PHYSICAL THERAPY TREATMENT  Patient Name: [name] MRN: [id number] DOB:16-Dec-1955, 66 y.o., male Today's Date: 09/07/2021   PT End of Session - 09/07/21 1123     Visit Number 3    Number of Visits 12    Date for PT Re-Evaluation 10/12/21    Authorization Type [employer] (visits medicare guidelines, No auth)    Progress Note Due on Visit 10    PT Start Time 1119    PT Stop Time 1200    PT Time Calculation (min) 41 min              Past Medical History:  Diagnosis Date   Arthritis    Diabetes mellitus without complication ([place]) 4818   Diabetes Type II   GERD (gastroesophageal reflux disease)    diet controlled   HTN (hypertension) 10/27/2018   Hyperlipidemia    Internal hemorrhoids 2014   noted on colonoscopy   MSSA (methicillin susceptible Staphylococcus aureus) infection 09/03/2020   Osteoarthritis of both knees    Prostate cancer ([place])    prostate   Sigmoid diverticulosis 2014   Mild, noted on colonoscopy   Skin lesion 01/01/2021   Type 2 diabetes mellitus with other specified complication ([place]) 56/31/4970   Vaccine counseling 05/04/2021   Past Surgical History:  Procedure Laterality Date   CARPAL TUNNEL RELEASE Right 02/22/2019   Procedure: CARPAL TUNNEL RELEASE;  Surgeon: Carole Civil, MD;  Location: [facility];  Service: Orthopedics;  Laterality: Right;   COLONOSCOPY N/A 09/13/2012   Procedure: COLONOSCOPY;  Surgeon: Danie Binder, MD;  Location: [facility];  Service: Endoscopy;  Laterality: N/A;  9:30 AM   CYSTOSCOPY WITH URETHRAL DILATATION N/A 06/02/2020   Procedure: CYSTOSCOPY WITH URETHRAL DILATATION;  Surgeon: [name], MD;  Location: [facility];  Service: Urology;  Laterality: N/A;  ONLY NEEDS 30 MIN   EXCISIONAL TOTAL KNEE ARTHROPLASTY WITH ANTIBIOTIC SPACERS Left 05/15/2021   Procedure: LEFT TOTAL KNEE IRRIGATION AND DEBRIDEMENT, EXCISION LEFT TOTAL KNEE ARTHROPLASTY WITH ANTIBIOTIC SPACERS;  Surgeon: Mcarthur Rossetti, MD;  Location: [facility];  Service: Orthopedics;  Laterality: Left;   HERNIA REPAIR     20 years ago   I & D KNEE WITH POLY EXCHANGE Left 08/14/2020   Procedure: IRRIGATION AND DEBRIDEMENT LEFT KNEE with synovectomy WITH POLY EXCHANGE;  Surgeon: Mcarthur Rossetti, MD;  Location: [facility];  Service: Orthopedics;  Laterality: Left;   KNEE ARTHROSCOPY Left    15 years ago   LYMPHADENECTOMY Bilateral 05/30/2017   Procedure: LYMPHADENECTOMY, PELVIC EXTENDED;  Surgeon: [name], MD;  Location: [facility];  Service: Urology;  Laterality: Bilateral;   ROBOT ASSISTED LAPAROSCOPIC RADICAL PROSTATECTOMY N/A 05/30/2017   Procedure: XI ROBOTIC ASSISTED LAPAROSCOPIC RADICAL PROSTATECTOMY LEVEL 3;  Surgeon: [name], MD;  Location: [facility];  Service: Urology;  Laterality: N/A;   SHOULDER ARTHROSCOPY Right    15 years ago   TOTAL KNEE ARTHROPLASTY Left 03/21/2019   Procedure: LEFT TOTAL KNEE ARTHROPLASTY;  Surgeon: Mcarthur Rossetti, MD;  Location: [facility];  Service: Orthopedics;  Laterality: Left;   TOTAL KNEE REVISION Left 08/14/2021   Procedure: REMOVAL OF ANTIBIOTIC SPACER AND LEFT TOTAL KNEE REVISION ARTHROPLASTY;  Surgeon: Mcarthur Rossetti, MD;  Location: [facility];  Service: Orthopedics;  Laterality: Left;   Patient Active Problem List   Diagnosis Date Noted   Status post revision of total replacement of left knee 08/27/2021   Status post revision of total knee replacement, left 08/14/2021  PICC (peripherally inserted central catheter) in place 06/09/2021   Vaccine counseling 05/04/2021   Skin lesion 01/01/2021   Type 2 diabetes mellitus with other specified complication ([place]) 46/50/3546   Nausea and vomiting 09/11/2020   MSSA (methicillin susceptible Staphylococcus aureus) infection 09/03/2020   Effusion, left knee 08/14/2020   Infection of total left knee replacement ([place]) 08/14/2020   Status post total left knee replacement 03/21/2019   S/P carpal tunnel release  right 02/22/19 03/08/2019   Carpal tunnel syndrome of right wrist    HTN (hypertension) 10/27/2018   Prostate cancer ([place]) 05/30/2017   Malignant neoplasm of prostate ([place]) 04/28/2017   Chronic pain of both knees 02/21/2017   Unilateral primary osteoarthritis, left knee 02/21/2017   Unilateral primary osteoarthritis, right knee 02/21/2017    PCP: [name] MD  REFERRING PROVIDER: Mcarthur Rossetti, MD   REFERRING DIAG: [phone] (ICD-10-CM) - Status post revision of total replacement of left knee   THERAPY DIAG:  Left knee pain, unspecified chronicity  Stiffness of left knee, not elsewhere classified  Muscle weakness (generalized)  Other abnormalities of gait and mobility  Other symptoms and signs involving the musculoskeletal system  Rationale for Evaluation and Treatment Rehabilitation  ONSET DATE: 08/14/21  SUBJECTIVE:   SUBJECTIVE STATEMENT: 0/10 pain at rest but increases with movement.  He is going this week to get compression garment.   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 No  PRECAUTIONS: None  WEIGHT BEARING RESTRICTIONS No  FALLS:  Has patient fallen in last 6 months? No  LIVING ENVIRONMENT: Lives with: lives with their spouse Lives in: House/apartment Stairs: Yes: External: 3-4 steps; on right going up Has following equipment at home: Single point cane, Environmental consultant - 2 wheeled, and Crutches  OCCUPATION: Theme park manager  PLOF: Independent  PATIENT GOALS Get knee back to being right   OBJECTIVE:  Observation: LLE lymphedema   PATIENT SURVEYS:  FOTO  53% functional status  COGNITION:  Overall cognitive status: Within functional limits for tasks assessed     SENSATION: WFL  POSTURE: No Significant postural limitations  PALPATION: Slight tenderness throughout LLE  LOWER EXTREMITY  ROM:  Active ROM Right eval Left eval Left 09/02/21  Knee flexion 117 68 78  Knee extension 0 0 0    LOWER EXTREMITY MMT:  MMT Right eval Left eval  Hip flexion 5/5 4+/5  Hip extension    Hip abduction    Hip adduction    Hip internal rotation    Hip external rotation    Knee flexion 5/5 4+/5  Knee extension 5/5 4+/5  Ankle dorsiflexion 5/5 5/5  Ankle plantarflexion    Ankle inversion    Ankle eversion     (Blank rows = not tested)  Left Lower Extremity Lymphedema Final on 05/21/19 09/02/21  20 cm Proximal to Suprapatella  54 cm   56.5 05/02/19 57.5 cm  10 cm Proximal to Suprapatella  49 cm   52.3 05/02/19 52.5 cm  At Midpatella/Popliteal Crease  43.7 cm   44.2 05/02/19 47 cm  30 cm Proximal to Floor at Lateral Plantar Foot  36.2 cm   38.2 05/02/19 43.3 cm  20 cm Proximal to Floor at Lateral Plantar Foot  27 cm   28.2 05/02/19 36.2 cm  10 cm Proximal to Floor at Lateral Malleoli  25.3 cm   27.3 05/02/19 31 cm  Circumference of ankle/heel  34 cm.   35.5 05/02/19 37 cm  5 cm Proximal to 1st MTP Joint  22.8 cm  23.7 05/02/19 24 cm  Across MTP Joint  23 cm   23.8 05/02/19 23.8 cm        FUNCTIONAL TESTS:  5 times sit to stand: 11.5 seconds, relies on RLE with L knee in extension 2 minute walk test: 400 feet Stairs: able to do with alternating pattern with UE support, decreased knee flexion causing limitation  GAIT: Distance walked: 400 Assistive device utilized: None Level of assistance: Complete Independence Comments: 2MWT. Limited knee flexion, antalgic on L    TODAY'S TREATMENT: 09/07/21 Bike seat 16 1/2 revolutions x 5 min Knee drives on 6" box x 1' Heel raises/toe raises x 20 Slant board 5 x 20" Steps 1 rep each set of steps Step dips for knee flexion x 10 Seated on mat manual knee flexion in seated and supine x 10'; seated contract relax; no other treatment performed during manual treatment Bike again x 3' 1/2 revolutions AAROM knee flexion in sitting 90  degrees today    09/02/21 Bike seat 18 5 minutes rocking Standing: knee flexion stretch onto 12" step 10X10"  Lt knee flexoin 10X  Heelraises 10X Supine:  heelslides 10X Manual:  manual decongestive techniques/retro massage for Lt LE Completed FOTO  08/31/21 Heel slides 10 x 5-10 second holds Heel slides seated 10 x 5-10 second holds SLR 2x 10 STS 2x 10   PATIENT EDUCATION:  Education details: 6/14:  Lymphedema education and how untreated lymphedema leads to cellulitis and infection; reduction is through bandaging; imperative to wear thigh high compression;  evaluation: Patient educated on exam findings, POC, scope of PT, HEP, avoiding compensation, and getting compression garments. Person educated: Patient Education method: Explanation, Demonstration, and Handouts Education comprehension: verbalized understanding, returned demonstration, verbal cues required, and tactile cues required  HOME EXERCISE PROGRAM: 6/12/23Access Code: [email or web address] - Supine Heel Slide with Strap  - 5 x daily - 7 x weekly - 2 sets - 10 reps - 5-10 second hold - Seated Heel Slide  - 5 x daily - 7 x weekly - 2 sets - 10 reps - 5-10 second hold - Active Straight Leg Raise with Quad Set  - 3 x daily - 7 x weekly - 3 sets - 10 reps - Sit to Stand with Arms Crossed  - 3 x daily - 7 x weekly - 2 sets - 10 reps  ASSESSMENT:  CLINICAL IMPRESSION: Today's session focused on lower extremity mobility and strength.  Patient continues with steri-strips; significant swelling and lymphedema; at end of treatment patient with AAROM knee flexion in supine 90 degrees. Encouraged compliance with HEP and compression garment purchase.  Patient will benefit from skilled physical therapy in order to improve function and reduce impairment.  OBJECTIVE IMPAIRMENTS Abnormal gait, decreased activity tolerance, decreased mobility, difficulty walking, decreased ROM, decreased strength, increased edema, impaired flexibility, improper body  mechanics, and pain.   ACTIVITY LIMITATIONS lifting, bending, standing, squatting, sleeping, stairs, transfers, locomotion level, and caring for others  PARTICIPATION LIMITATIONS: meal prep, cleaning, shopping, community activity, and yard work  PERSONAL FACTORS Past/current experiences, Time since onset of injury/illness/exacerbation, and 3+ comorbidities: chronic lymphedema, infection with MRSA causing 2 part revision of TKA, Adenocarcinoma of prostate with lymphectomy of 38 nodes, DM, HTN, HLD  are also affecting patient's functional outcome.   REHAB POTENTIAL: Good  CLINICAL DECISION MAKING: Stable/uncomplicated  EVALUATION COMPLEXITY: Low   GOALS: Goals  reviewed with patient? Yes  SHORT TERM GOALS: Target date: 09/21/2021  Patient will be independent with HEP in order to improve functional outcomes. Baseline:  Goal status: IN PROGRESS  2.  Patient will report at least 25% improvement in symptoms for improved quality of life. Baseline:  Goal status: IN PROGRESS   LONG TERM GOALS: Target date: 10/12/2021  Patient will report at least 75% improvement in symptoms for improved quality of life. Baseline:  Goal status: IN PROGRESS  2.Patient will be able to complete 5x STS in under 11.4 seconds without compensation in order to demonstrate improved functional strength. Baseline: 11.5 seconds, relies on RLE with L knee in extension Goal status: IN PROGRESS  3.  Patient will be able to navigate stairs with reciprocal pattern without compensation in order to demonstrate improved LE strength. Baseline: see above Goal status: IN PROGRESS  4.  Patient will be able to ambulate at least 450 feet in 2MWT in order to demonstrate improved tolerance to activity. Baseline: 400 feet Goal status: IN PROGRESS  5.  Patient will improve ROM for left knee extension/flexion to 0-110 degrees to improve squatting, and other functional mobility. Baseline: 0-68 Goal status: IN  PROGRESS    PLAN: PT FREQUENCY: 2x/week  PT DURATION: 6 weeks  PLANNED INTERVENTIONS: Therapeutic exercises, Therapeutic activity, Neuromuscular re-education, Balance training, Gait training, Patient/Family education, Joint manipulation, Joint mobilization, Stair training, Orthotic/Fit training, DME instructions, Aquatic Therapy, Dry Needling, Electrical stimulation, Spinal manipulation, Spinal mobilization, Cryotherapy, Moist heat, Compression bandaging, scar mobilization, Splintting, Taping, Traction, Ultrasound, Ionotophoresis '4mg'$ /ml Dexamethasone, and Manual therapy   PLAN FOR NEXT SESSION:  F/U if purchased/wearing thigh high compression garment.  Continue to improved Lt knee mobility and strength, manual to reduce edema and education.  Progress as tolerated   11:58 AM, 09/07/21 Tashanda Fuhrer Small Mairyn Lenahan MPT [facility]  [phone]

## 2021-09-07 NOTE — Telephone Encounter (Signed)
Lvm for pt cb to advise. Will also send message to PT

## 2021-09-07 NOTE — Telephone Encounter (Signed)
Please see message on PT frequency

## 2021-09-07 NOTE — Telephone Encounter (Signed)
Patient wife called in stating patient has always had Physical therapy 3 times a week and the PT Nurse also stated he should be doing PT 3 times a week also, please advise on what you feel is best should he stay doing PT twice a week or should it be changed to 3 times a week  please call Jaymes, Hang (Spouse)  [phone] [facility])

## 2021-09-11 ENCOUNTER — Ambulatory Visit (HOSPITAL_COMMUNITY): Payer: HMO

## 2021-09-11 DIAGNOSIS — M25662 Stiffness of left knee, not elsewhere classified: Secondary | ICD-10-CM

## 2021-09-11 DIAGNOSIS — M25562 Pain in left knee: Secondary | ICD-10-CM | POA: Diagnosis not present

## 2021-09-11 DIAGNOSIS — R29898 Other symptoms and signs involving the musculoskeletal system: Secondary | ICD-10-CM

## 2021-09-11 DIAGNOSIS — R2689 Other abnormalities of gait and mobility: Secondary | ICD-10-CM

## 2021-09-11 DIAGNOSIS — M6281 Muscle weakness (generalized): Secondary | ICD-10-CM

## 2021-09-11 NOTE — Therapy (Signed)
OUTPATIENT PHYSICAL THERAPY TREATMENT  Patient Name: [name] MRN: [id number] DOB:Apr 26, 1955, 66 y.o., male Today's Date: 09/11/2021   PT End of Session - 09/11/21 0948     Visit Number 4    Number of Visits 12    Date for PT Re-Evaluation 10/12/21    Authorization Type [employer] (visits medicare guidelines, No auth)    Progress Note Due on Visit 10    PT Start Time 0951    PT Stop Time 1030    PT Time Calculation (min) 39 min               Past Medical History:  Diagnosis Date   Arthritis    Diabetes mellitus without complication (HCC) 2019   Diabetes Type II   GERD (gastroesophageal reflux disease)    diet controlled   HTN (hypertension) 10/27/2018   Hyperlipidemia    Internal hemorrhoids 2014   noted on colonoscopy   MSSA (methicillin susceptible Staphylococcus aureus) infection 09/03/2020   Osteoarthritis of both knees    Prostate cancer (HCC)    prostate   Sigmoid diverticulosis 2014   Mild, noted on colonoscopy   Skin lesion 01/01/2021   Type 2 diabetes mellitus with other specified complication (HCC) 01/01/2021   Vaccine counseling 05/04/2021   Past Surgical History:  Procedure Laterality Date   CARPAL TUNNEL RELEASE Right 02/22/2019   Procedure: CARPAL TUNNEL RELEASE;  Surgeon: Vickki Hearing, MD;  Location: [facility];  Service: Orthopedics;  Laterality: Right;   COLONOSCOPY N/A 09/13/2012   Procedure: COLONOSCOPY;  Surgeon: West Bali, MD;  Location: [facility];  Service: Endoscopy;  Laterality: N/A;  9:30 AM   CYSTOSCOPY WITH URETHRAL DILATATION N/A 06/02/2020   Procedure: CYSTOSCOPY WITH URETHRAL DILATATION;  Surgeon: Heloise Purpura, MD;  Location: [facility];  Service: Urology;  Laterality: N/A;  ONLY NEEDS 30 MIN   EXCISIONAL TOTAL KNEE ARTHROPLASTY WITH ANTIBIOTIC SPACERS Left 05/15/2021   Procedure: LEFT TOTAL KNEE IRRIGATION AND DEBRIDEMENT, EXCISION LEFT TOTAL KNEE ARTHROPLASTY WITH ANTIBIOTIC SPACERS;  Surgeon: Kathryne Hitch, MD;  Location: [facility];  Service: Orthopedics;  Laterality: Left;   HERNIA REPAIR     20 years ago   I & D KNEE WITH POLY EXCHANGE Left 08/14/2020   Procedure: IRRIGATION AND DEBRIDEMENT LEFT KNEE with synovectomy WITH POLY EXCHANGE;  Surgeon: Kathryne Hitch, MD;  Location: [facility];  Service: Orthopedics;  Laterality: Left;   KNEE ARTHROSCOPY Left    15 years ago   LYMPHADENECTOMY Bilateral 05/30/2017   Procedure: LYMPHADENECTOMY, PELVIC EXTENDED;  Surgeon: Heloise Purpura, MD;  Location: [facility];  Service: Urology;  Laterality: Bilateral;   ROBOT ASSISTED LAPAROSCOPIC RADICAL PROSTATECTOMY N/A 05/30/2017   Procedure: XI ROBOTIC ASSISTED LAPAROSCOPIC RADICAL PROSTATECTOMY LEVEL 3;  Surgeon: Heloise Purpura, MD;  Location: [facility];  Service: Urology;  Laterality: N/A;   SHOULDER ARTHROSCOPY Right    15 years ago   TOTAL KNEE ARTHROPLASTY Left 03/21/2019   Procedure: LEFT TOTAL KNEE ARTHROPLASTY;  Surgeon: Kathryne Hitch, MD;  Location: [facility];  Service: Orthopedics;  Laterality: Left;   TOTAL KNEE REVISION Left 08/14/2021   Procedure: REMOVAL OF ANTIBIOTIC SPACER AND LEFT TOTAL KNEE REVISION ARTHROPLASTY;  Surgeon: Kathryne Hitch, MD;  Location: [facility];  Service: Orthopedics;  Laterality: Left;   Patient Active Problem List   Diagnosis Date Noted   Status post revision of total replacement of left knee 08/27/2021   Status post revision of total knee replacement, left 08/14/2021  PICC (peripherally inserted central catheter) in place 06/09/2021   Vaccine counseling 05/04/2021   Skin lesion 01/01/2021   Type 2 diabetes mellitus with other specified complication (HCC) 01/01/2021   Nausea and vomiting 09/11/2020   MSSA (methicillin susceptible Staphylococcus aureus) infection 09/03/2020   Effusion, left knee 08/14/2020   Infection of total left knee replacement (HCC) 08/14/2020   Status post total left knee replacement 03/21/2019   S/P carpal tunnel release  right 02/22/19 03/08/2019   Carpal tunnel syndrome of right wrist    HTN (hypertension) 10/27/2018   Prostate cancer (HCC) 05/30/2017   Malignant neoplasm of prostate (HCC) 04/28/2017   Chronic pain of both knees 02/21/2017   Unilateral primary osteoarthritis, left knee 02/21/2017   Unilateral primary osteoarthritis, right knee 02/21/2017    PCP: Chilton Greathouse MD  REFERRING PROVIDER: Kathryne Hitch, MD   REFERRING DIAG: [phone] (ICD-10-CM) - Status post revision of total replacement of left knee   THERAPY DIAG:  Left knee pain, unspecified chronicity  Stiffness of left knee, not elsewhere classified  Muscle weakness (generalized)  Other abnormalities of gait and mobility  Other symptoms and signs involving the musculoskeletal system  Rationale for Evaluation and Treatment Rehabilitation  ONSET DATE: 08/14/21  SUBJECTIVE:   SUBJECTIVE STATEMENT: Patient reports stiffness in the knee this morning.  He was on his knee a lot yesterday on concrete; rode in car with knee hanging down for a while. No pain at rest but has pain with movement. He still has not gotten his compression garment. "I've got to go get that thing".  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 No and Yes: NPRS scale: 0/10 Pain location: left knee Pain description: stiffness  Aggravating factors: movement Relieving factors: rest, ice  PRECAUTIONS: None  WEIGHT BEARING RESTRICTIONS No  FALLS:  Has patient fallen in last 6 months? No  LIVING ENVIRONMENT: Lives with: lives with their spouse Lives in: House/apartment Stairs: Yes: External: 3-4 steps; on right going up Has following equipment at home: Single point cane, Environmental consultant - 2 wheeled, and Crutches  OCCUPATION: Education officer, environmental  PLOF: Independent  PATIENT GOALS Get knee back to being  right   OBJECTIVE:  Observation: LLE lymphedema   PATIENT SURVEYS:  FOTO  53% functional status  COGNITION:  Overall cognitive status: Within functional limits for tasks assessed     SENSATION: WFL  POSTURE: No Significant postural limitations  PALPATION: Slight tenderness throughout LLE  LOWER EXTREMITY ROM:  Active ROM Right eval Left eval Left 09/02/21  Knee flexion 117 68 78  Knee extension 0 0 0    LOWER EXTREMITY MMT:  MMT Right eval Left eval  Hip flexion 5/5 4+/5  Hip extension    Hip abduction    Hip adduction    Hip internal rotation    Hip external rotation    Knee flexion 5/5 4+/5  Knee extension 5/5 4+/5  Ankle dorsiflexion 5/5 5/5  Ankle plantarflexion    Ankle inversion    Ankle eversion     (Blank rows = not tested)  Left Lower Extremity Lymphedema Final on 05/21/19 09/02/21  20 cm Proximal to Suprapatella  54 cm   56.5 05/02/19 57.5 cm  10 cm Proximal to Suprapatella  49 cm   52.3 05/02/19 52.5 cm  At Midpatella/Popliteal Crease  43.7 cm   44.2 05/02/19 47 cm  30  cm Proximal to Floor at Lateral Plantar Foot  36.2 cm   38.2 05/02/19 43.3 cm  20 cm Proximal to Floor at Lateral Plantar Foot  27 cm   28.2 05/02/19 36.2 cm  10 cm Proximal to Floor at Lateral Malleoli  25.3 cm   27.3 05/02/19 31 cm  Circumference of ankle/heel  34 cm.   35.5 05/02/19 37 cm  5 cm Proximal to 1st MTP Joint  22.8 cm  23.7 05/02/19 24 cm  Across MTP Joint  23 cm   23.8 05/02/19 23.8 cm        FUNCTIONAL TESTS:  5 times sit to stand: 11.5 seconds, relies on RLE with L knee in extension 2 minute walk test: 400 feet Stairs: able to do with alternating pattern with UE support, decreased knee flexion causing limitation  GAIT: Distance walked: 400 Assistive device utilized: None Level of assistance: Complete Independence Comments: . Limited knee flexion, antalgic on L    TODAY'S TREATMENT: 09/11/21 Bike seat 16 1/2 revolutions x 5 min  Standing: //  bars Heel raises x 20 no UE hold Slant board 5 x 20" Knee drives on 8 inch box x 2' Hamstring stretch on box 5 x 20" Squats  2 x 10 to chair seat height  Seated Step dips for knee flexion x 2' STM and manual knee flexion x 10; no other treatment performed during manual treatment.         09/07/21 Bike seat 16 1/2 revolutions x 5 min Knee drives on 6" box x 1' Heel raises/toe raises x 20 Slant board 5 x 20" Steps 1 rep each set of steps Step dips for knee flexion x 10 Seated on mat manual knee flexion in seated and supine x 10'; seated contract relax; no other treatment performed during manual treatment Bike again x 3' 1/2 revolutions AAROM knee flexion in sitting 90 degrees today    09/02/21 Bike seat 18 5 minutes rocking Standing: knee flexion stretch onto 12" step 10X10"  Lt knee flexoin 10X  Heelraises 10X Supine:  heelslides 10X Manual:  manual decongestive techniques/retro massage for Lt LE Completed FOTO  08/31/21 Heel slides 10 x 5-10 second holds Heel slides seated 10 x 5-10 second holds SLR 2x 10 STS 2x 10   PATIENT EDUCATION:  Education details: 6/14:  Lymphedema education and how untreated lymphedema leads to cellulitis and infection; reduction is through bandaging; imperative to wear thigh high compression;  evaluation: Patient educated on exam findings, POC, scope of PT, HEP, avoiding compensation, and getting compression garments. Person educated: Patient Education method: Explanation, Demonstration, and Handouts Education comprehension: verbalized understanding, returned demonstration, verbal cues required, and tactile cues required  HOME EXERCISE PROGRAM: 6/12/23Access Code: [email or web address] - Supine Heel Slide with Strap  - 5 x daily - 7 x weekly - 2 sets - 10 reps - 5-10 second hold - Seated Heel Slide  - 5 x daily - 7 x weekly - 2 sets - 10 reps - 5-10 second hold - Active Straight Leg Raise with Quad Set  - 3 x daily - 7 x weekly - 3 sets - 10 reps -  Sit to Stand with Arms Crossed  - 3 x daily - 7 x weekly - 2 sets - 10 reps  ASSESSMENT:  CLINICAL IMPRESSION: Today's session focused on lower extremity mobility and strength.  Of note increased lymphedema left knee that limits mobility.  Discussed again with him getting compression garment.  Still unable to make full revolution  on bike overall he works hard in therapy.  Patient will benefit from skilled physical therapy in order to improve function and reduce impairment.  OBJECTIVE IMPAIRMENTS Abnormal gait, decreased activity tolerance, decreased mobility, difficulty walking, decreased ROM, decreased strength, increased edema, impaired flexibility, improper body mechanics, and pain.   ACTIVITY LIMITATIONS lifting, bending, standing, squatting, sleeping, stairs, transfers, locomotion level, and caring for others  PARTICIPATION LIMITATIONS: meal prep, cleaning, shopping, community activity, and yard work  PERSONAL FACTORS Past/current experiences, Time since onset of injury/illness/exacerbation, and 3+ comorbidities: chronic lymphedema, infection with MRSA causing 2 part revision of TKA, Adenocarcinoma of prostate with lymphectomy of 38 nodes, DM, HTN, HLD  are also affecting patient's functional outcome.   REHAB POTENTIAL: Good  CLINICAL DECISION MAKING: Stable/uncomplicated  EVALUATION COMPLEXITY: Low   GOALS: Goals reviewed with patient? Yes  SHORT TERM GOALS: Target date: 09/21/2021  Patient will be independent with HEP in order to improve functional outcomes. Baseline:  Goal status: IN PROGRESS  2.  Patient will report at least 25% improvement in symptoms for improved quality of life. Baseline:  Goal status: IN PROGRESS   LONG TERM GOALS: Target date: 10/12/2021  Patient will report at least 75% improvement in symptoms for improved quality of life. Baseline:  Goal status: IN PROGRESS  2.Patient will be able to complete 5x STS in under 11.4 seconds without compensation in  order to demonstrate improved functional strength. Baseline: 11.5 seconds, relies on RLE with L knee in extension Goal status: IN PROGRESS  3.  Patient will be able to navigate stairs with reciprocal pattern without compensation in order to demonstrate improved LE strength. Baseline: see above Goal status: IN PROGRESS  4.  Patient will be able to ambulate at least 450 feet in in order to demonstrate improved tolerance to activity. Baseline: 400 feet Goal status: IN PROGRESS  5.  Patient will improve ROM for left knee extension/flexion to 0-110 degrees to improve squatting, and other functional mobility. Baseline: 0-68 Goal status: IN PROGRESS    PLAN: PT FREQUENCY: 2x/week  PT DURATION: 6 weeks  PLANNED INTERVENTIONS: Therapeutic exercises, Therapeutic activity, Neuromuscular re-education, Balance training, Gait training, Patient/Family education, Joint manipulation, Joint mobilization, Stair training, Orthotic/Fit training, DME instructions, Aquatic Therapy, Dry Needling, Electrical stimulation, Spinal manipulation, Spinal mobilization, Cryotherapy, Moist heat, Compression bandaging, scar mobilization, Splintting, Taping, Traction, Ultrasound, Ionotophoresis 4mg /ml Dexamethasone, and Manual therapy   PLAN FOR NEXT SESSION:  F/U if purchased/wearing thigh high compression garment.  Continue to improved Lt knee mobility and strength, manual to reduce edema and education.  Progress as tolerated   10:27 AM, 09/11/21 Dayyan Krist Small Leticia Coletta MPT [place] [facility] [place] [phone]

## 2021-09-14 ENCOUNTER — Encounter (HOSPITAL_COMMUNITY): Payer: Self-pay | Admitting: Physical Therapy

## 2021-09-14 ENCOUNTER — Ambulatory Visit (HOSPITAL_COMMUNITY): Payer: HMO | Admitting: Physical Therapy

## 2021-09-14 DIAGNOSIS — M25562 Pain in left knee: Secondary | ICD-10-CM | POA: Diagnosis not present

## 2021-09-14 DIAGNOSIS — M25662 Stiffness of left knee, not elsewhere classified: Secondary | ICD-10-CM

## 2021-09-15 ENCOUNTER — Ambulatory Visit (HOSPITAL_COMMUNITY): Payer: HMO

## 2021-09-17 ENCOUNTER — Ambulatory Visit (HOSPITAL_COMMUNITY): Payer: HMO | Admitting: Physical Therapy

## 2021-09-17 ENCOUNTER — Encounter (HOSPITAL_COMMUNITY): Payer: Self-pay | Admitting: Physical Therapy

## 2021-09-17 DIAGNOSIS — M25662 Stiffness of left knee, not elsewhere classified: Secondary | ICD-10-CM

## 2021-09-17 DIAGNOSIS — M6281 Muscle weakness (generalized): Secondary | ICD-10-CM

## 2021-09-17 DIAGNOSIS — M25562 Pain in left knee: Secondary | ICD-10-CM

## 2021-09-17 NOTE — Therapy (Signed)
OUTPATIENT PHYSICAL THERAPY TREATMENT  Patient Name: [name] MRN: [id number] DOB:11/14/1955, 66 y.o., male Today's Date: 09/17/2021   PT End of Session - 09/17/21 0855     Visit Number 6    Number of Visits 12    Date for PT Re-Evaluation 10/12/21    Authorization Type [employer] (visits medicare guidelines, No auth)    Progress Note Due on Visit 10    PT Start Time 0900    PT Stop Time 0940    PT Time Calculation (min) 40 min    Activity Tolerance Patient tolerated treatment well    Behavior During Therapy [facility] for tasks assessed/performed                Past Medical History:  Diagnosis Date   Arthritis    Diabetes mellitus without complication ([place]) 9480   Diabetes Type II   GERD (gastroesophageal reflux disease)    diet controlled   HTN (hypertension) 10/27/2018   Hyperlipidemia    Internal hemorrhoids 2014   noted on colonoscopy   MSSA (methicillin susceptible Staphylococcus aureus) infection 09/03/2020   Osteoarthritis of both knees    Prostate cancer ([place])    prostate   Sigmoid diverticulosis 2014   Mild, noted on colonoscopy   Skin lesion 01/01/2021   Type 2 diabetes mellitus with other specified complication ([place]) 16/55/3748   Vaccine counseling 05/04/2021   Past Surgical History:  Procedure Laterality Date   CARPAL TUNNEL RELEASE Right 02/22/2019   Procedure: CARPAL TUNNEL RELEASE;  Surgeon: Carole Civil, MD;  Location: [facility];  Service: Orthopedics;  Laterality: Right;   COLONOSCOPY N/A 09/13/2012   Procedure: COLONOSCOPY;  Surgeon: Danie Binder, MD;  Location: [facility];  Service: Endoscopy;  Laterality: N/A;  9:30 AM   CYSTOSCOPY WITH URETHRAL DILATATION N/A 06/02/2020   Procedure: CYSTOSCOPY WITH URETHRAL DILATATION;  Surgeon: [name], MD;  Location: [facility];  Service: Urology;  Laterality: N/A;  ONLY NEEDS 30 MIN   EXCISIONAL TOTAL KNEE ARTHROPLASTY WITH ANTIBIOTIC SPACERS Left 05/15/2021   Procedure: LEFT  TOTAL KNEE IRRIGATION AND DEBRIDEMENT, EXCISION LEFT TOTAL KNEE ARTHROPLASTY WITH ANTIBIOTIC SPACERS;  Surgeon: Mcarthur Rossetti, MD;  Location: [facility];  Service: Orthopedics;  Laterality: Left;   HERNIA REPAIR     20 years ago   I & D KNEE WITH POLY EXCHANGE Left 08/14/2020   Procedure: IRRIGATION AND DEBRIDEMENT LEFT KNEE with synovectomy WITH POLY EXCHANGE;  Surgeon: Mcarthur Rossetti, MD;  Location: [place];  Service: Orthopedics;  Laterality: Left;   KNEE ARTHROSCOPY Left    15 years ago   LYMPHADENECTOMY Bilateral 05/30/2017   Procedure: LYMPHADENECTOMY, PELVIC EXTENDED;  Surgeon: [name], MD;  Location: [facility];  Service: Urology;  Laterality: Bilateral;   ROBOT ASSISTED LAPAROSCOPIC RADICAL PROSTATECTOMY N/A 05/30/2017   Procedure: XI ROBOTIC ASSISTED LAPAROSCOPIC RADICAL PROSTATECTOMY LEVEL 3;  Surgeon: [name], MD;  Location: [facility];  Service: Urology;  Laterality: N/A;   SHOULDER ARTHROSCOPY Right    15 years ago   TOTAL KNEE ARTHROPLASTY Left 03/21/2019   Procedure: LEFT TOTAL KNEE ARTHROPLASTY;  Surgeon: Mcarthur Rossetti, MD;  Location: [facility];  Service: Orthopedics;  Laterality: Left;   TOTAL KNEE REVISION Left 08/14/2021   Procedure: REMOVAL OF ANTIBIOTIC SPACER AND LEFT TOTAL KNEE REVISION ARTHROPLASTY;  Surgeon: Mcarthur Rossetti, MD;  Location: [facility];  Service: Orthopedics;  Laterality: Left;   Patient Active Problem List   Diagnosis Date Noted   Status  post revision of total replacement of left knee 08/27/2021   Status post revision of total knee replacement, left 08/14/2021   PICC (peripherally inserted central catheter) in place 06/09/2021   Vaccine counseling 05/04/2021   Skin lesion 01/01/2021   Type 2 diabetes mellitus with other specified complication ([place]) 42/70/6237   Nausea and vomiting 09/11/2020   MSSA (methicillin susceptible Staphylococcus aureus) infection 09/03/2020   Effusion, left knee 08/14/2020   Infection of total  left knee replacement ([place]) 08/14/2020   Status post total left knee replacement 03/21/2019   S/P carpal tunnel release right 02/22/19 03/08/2019   Carpal tunnel syndrome of right wrist    HTN (hypertension) 10/27/2018   Prostate cancer ([place]) 05/30/2017   Malignant neoplasm of prostate ([place]) 04/28/2017   Chronic pain of both knees 02/21/2017   Unilateral primary osteoarthritis, left knee 02/21/2017   Unilateral primary osteoarthritis, right knee 02/21/2017    PCP: [name] MD  REFERRING PROVIDER: Mcarthur Rossetti, MD   REFERRING DIAG: [phone] (ICD-10-CM) - Status post revision of total replacement of left knee   THERAPY DIAG:  Left knee pain, unspecified chronicity  Stiffness of left knee, not elsewhere classified  Muscle weakness (generalized)  Rationale for Evaluation and Treatment Rehabilitation   ONSET DATE: 08/14/21  SUBJECTIVE:   SUBJECTIVE STATEMENT:  Having a little more pain today. Was able to do more yesterday with new compression socks. Got them yesterday morning and has been wearing since. They are helping with swelling, and mobility.   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 No and Yes: NPRS scale: 5/10 Pain location: left knee Pain description: stiffness  Aggravating factors: movement Relieving factors: rest, ice  PRECAUTIONS: None  WEIGHT BEARING RESTRICTIONS No  FALLS:  Has patient fallen in last 6 months? No  LIVING ENVIRONMENT: Lives with: lives with their spouse Lives in: House/apartment Stairs: Yes: External: 3-4 steps; on right going up Has following equipment at home: Single point cane, Environmental consultant - 2 wheeled, and Crutches  OCCUPATION: Theme park manager  PLOF: Independent  PATIENT GOALS Get knee back to being right   OBJECTIVE:  Observation: LLE lymphedema   PATIENT SURVEYS:  FOTO   53% functional status  COGNITION:  Overall cognitive status: Within functional limits for tasks assessed     SENSATION: WFL  POSTURE: No Significant postural limitations  PALPATION: Slight tenderness throughout LLE  LOWER EXTREMITY ROM:  Active ROM Right eval Left eval Left 09/02/21  Knee flexion 117 68 78  Knee extension 0 0 0    LOWER EXTREMITY MMT:  MMT Right eval Left eval  Hip flexion 5/5 4+/5  Hip extension    Hip abduction    Hip adduction    Hip internal rotation    Hip external rotation    Knee flexion 5/5 4+/5  Knee extension 5/5 4+/5  Ankle dorsiflexion 5/5 5/5  Ankle plantarflexion    Ankle inversion    Ankle eversion     (Blank rows = not tested)  Left Lower Extremity Lymphedema Final on 05/21/19 09/02/21  20 cm Proximal to Suprapatella  54 cm   56.5 05/02/19 57.5 cm  10 cm Proximal to Suprapatella  49 cm   52.3 05/02/19 52.5 cm  At Midpatella/Popliteal Crease  43.7 cm   44.2 05/02/19 47 cm  30 cm Proximal to Floor at Lateral Plantar Foot  36.2 cm  38.2 05/02/19 43.3 cm  20 cm Proximal to Floor at Lateral Plantar Foot  27 cm   28.2 05/02/19 36.2 cm  10 cm Proximal to Floor at Lateral Malleoli  25.3 cm   27.3 05/02/19 31 cm  Circumference of ankle/heel  34 cm.   35.5 05/02/19 37 cm  5 cm Proximal to 1st MTP Joint  22.8 cm  23.7 05/02/19 24 cm  Across MTP Joint  23 cm   23.8 05/02/19 23.8 cm        FUNCTIONAL TESTS:  5 times sit to stand: 11.5 seconds, relies on RLE with L knee in extension 2 minute walk test: 400 feet Stairs: able to do with alternating pattern with UE support, decreased knee flexion causing limitation  GAIT: Distance walked: 400 Assistive device utilized: None Level of assistance: Complete Independence Comments: 2MWT. Limited knee flexion, antalgic on L    TODAY'S TREATMENT: 09/17/21 Rec bike 5 min (seat 15) for ROM  Calf stretch 3 x 30"  Heel raise 2 x10 Hamstring stretch on box 6 inch 3 x 30" Step ups 6 inch 2 x 10  no HHA  Step down 6 inch 2 x 10  no HHA Single leg stance 3 x 15" each  TKE 3 plates   Sit to stand 2 x 10  Leg press 3 plates 2 x 10   Clinic ambulation 1 RT no AD  Manual scar tissue massage, patellar mobs (supine); seated knee flexion PROM with contract/ relax (up to 82 degrees PROM)  Limited to 76 degrees knee flexion in supine due to pain   09/14/21 Rec bike seat 16 5 min for AROM  Heel raise x 20 Calf stretch 3 x 30" Knee drive 8 inch box 5 x 10" Hamstring stretch on 8 inch box 5 x 10" Step ups 6 inch x 20 HHA x 2 Step down 6 inch 2 x 10 HHA x 2  Seated dip x10  Seated knee flexion AAROM 10 x 5" Sit to stand 2 x 10 Clinic ambulation 1 RT no AD  Supine SLR Heel slide with strap   Manual edema massage and knee flexion PROM    Lt knee AROM 0-76 deg  09/11/21 Bike seat 16 1/2 revolutions x 5 min  Standing: // bars Heel raises x 20 no UE hold Slant board 5 x 20" Knee drives on 8 inch box x 2' Hamstring stretch on box 5 x 20" Squats  2 x 10 to chair seat height  Seated Step dips for knee flexion x 2' STM and manual knee flexion x 10; no other treatment performed during manual treatment.    09/07/21 Bike seat 16 1/2 revolutions x 5 min Knee drives on 6" box x 1' Heel raises/toe raises x 20 Slant board 5 x 20" Steps 1 rep each set of steps Step dips for knee flexion x 10 Seated on mat manual knee flexion in seated and supine x 10'; seated contract relax; no other treatment performed during manual treatment Bike again x 3' 1/2 revolutions AAROM knee flexion in sitting 90 degrees today    09/02/21 Bike seat 18 5 minutes rocking Standing: knee flexion stretch onto 12" step 10X10"  Lt knee flexoin 10X  Heelraises 10X Supine:  heelslides 10X Manual:  manual decongestive techniques/retro massage for Lt LE Completed FOTO  08/31/21 Heel slides 10 x 5-10 second holds Heel slides seated 10 x 5-10 second holds SLR 2x 10 STS 2x 10   PATIENT EDUCATION:  Education details: 6/14:  Lymphedema education and how untreated lymphedema leads to cellulitis and infection; reduction is through bandaging; imperative to wear thigh high compression;  evaluation: Patient educated on exam findings, POC, scope of PT, HEP, avoiding compensation, and getting compression garments. Person educated: Patient Education method: Explanation, Demonstration, and Handouts Education comprehension: verbalized understanding, returned demonstration, verbal cues required, and tactile cues required  HOME EXERCISE PROGRAM: 6/12/23Access Code: [email or web address] - Supine Heel Slide with Strap  - 5 x daily - 7 x weekly - 2 sets - 10 reps - 5-10 second hold - Seated Heel Slide  - 5 x daily - 7 x weekly - 2 sets - 10 reps - 5-10 second hold - Active Straight Leg Raise with Quad Set  - 3 x daily - 7 x weekly - 3 sets - 10 reps - Sit to Stand with Arms Crossed  - 3 x daily - 7 x weekly - 2 sets - 10 reps  ASSESSMENT:  CLINICAL IMPRESSION: Patient continues to struggle with AROM, due to pain. Patient was able to progress LE strengthening with good tolerance. Discussed use of prescribed pain medication prior to therapy sessions for improved tolerance and maximized benefit of mobility activity. Patient will continue to benefit from skilled therapy services to reduce remaining deficits and improve functional ability.     OBJECTIVE IMPAIRMENTS Abnormal gait, decreased activity tolerance, decreased mobility, difficulty walking, decreased ROM, decreased strength, increased edema, impaired flexibility, improper body mechanics, and pain.   ACTIVITY LIMITATIONS lifting, bending, standing, squatting, sleeping, stairs, transfers, locomotion level, and caring for others  PARTICIPATION LIMITATIONS: meal prep, cleaning, shopping, community activity, and yard work  PERSONAL FACTORS Past/current experiences, Time since onset of injury/illness/exacerbation, and 3+ comorbidities: chronic lymphedema,  infection with MRSA causing 2 part revision of TKA, Adenocarcinoma of prostate with lymphectomy of 38 nodes, DM, HTN, HLD  are also affecting patient's functional outcome.   REHAB POTENTIAL: Good  CLINICAL DECISION MAKING: Stable/uncomplicated  EVALUATION COMPLEXITY: Low   GOALS: Goals reviewed with patient? Yes  SHORT TERM GOALS: Target date: 09/21/2021  Patient will be independent with HEP in order to improve functional outcomes. Baseline:  Goal status: IN PROGRESS  2.  Patient will report at least 25% improvement in symptoms for improved quality of life. Baseline:  Goal status: IN PROGRESS   LONG TERM GOALS: Target date: 10/12/2021  Patient will report at least 75% improvement in symptoms for improved quality of life. Baseline:  Goal status: IN PROGRESS  2.Patient will be able to complete 5x STS in under 11.4 seconds without compensation in order to demonstrate improved functional strength. Baseline: 11.5 seconds, relies on RLE with L knee in extension Goal status: IN PROGRESS  3.  Patient will be able to navigate stairs with reciprocal pattern without compensation in order to demonstrate improved LE strength. Baseline: see above Goal status: IN PROGRESS  4.  Patient will be able to ambulate at least 450 feet in 2MWT in order to demonstrate improved tolerance to activity. Baseline: 400 feet Goal status: IN PROGRESS  5.  Patient will improve ROM for left knee extension/flexion to 0-110 degrees to improve squatting, and other functional mobility. Baseline: 0-68 Goal status: IN PROGRESS    PLAN: PT FREQUENCY: 2x/week  PT DURATION: 6 weeks  PLANNED INTERVENTIONS: Therapeutic exercises, Therapeutic activity, Neuromuscular re-education, Balance training, Gait training, Patient/Family education, Joint manipulation, Joint mobilization, Stair training, Orthotic/Fit training, DME instructions, Aquatic Therapy, Dry Needling, Electrical stimulation, Spinal manipulation, Spinal  mobilization, Cryotherapy, Moist heat, Compression  bandaging, scar mobilization, Splintting, Taping, Traction, Ultrasound, Ionotophoresis '4mg'$ /ml Dexamethasone, and Manual therapy   PLAN FOR NEXT SESSION:   Continue to improved Lt knee mobility and strength, manual to reduce edema and education.  Progress as tolerated  8:55 AM, 09/17/21 Josue Hector PT DPT  Physical Therapist with [facility]  [phone]

## 2021-09-18 ENCOUNTER — Telehealth: Payer: Self-pay | Admitting: Orthopaedic Surgery

## 2021-09-18 ENCOUNTER — Other Ambulatory Visit: Payer: Self-pay | Admitting: Orthopaedic Surgery

## 2021-09-18 MED ORDER — HYDROCODONE-ACETAMINOPHEN 7.5-325 MG PO TABS: 1.0000 | ORAL_TABLET | Freq: Four times a day (QID) | ORAL | 0 refills | Status: DC | PRN

## 2021-09-18 NOTE — Telephone Encounter (Signed)
Called pt and advised. He stated understanding. I told him gil and I are here on Monday if he is still having problems to give [place] a call. He stated he would

## 2021-09-18 NOTE — Telephone Encounter (Signed)
Patient's wife called. He needs a refill on hydrocodone. Also he has a lot of swollen going on. Would like to know if prednisone will help. Her number is [phone]

## 2021-09-23 ENCOUNTER — Telehealth (HOSPITAL_COMMUNITY): Payer: Self-pay | Admitting: Physical Therapy

## 2021-09-23 ENCOUNTER — Encounter (HOSPITAL_COMMUNITY): Payer: HMO | Admitting: Physical Therapy

## 2021-09-23 NOTE — Telephone Encounter (Signed)
Pt did not show for appointment.  Called and spoke to pt who states he got his days mixed up and thought his appt was tomorrow.  Offered available appointments for tomorrow,  however pt has a funeral to attend.  Teena Irani, PTA/CLT [place] Ph: [phone]

## 2021-09-24 ENCOUNTER — Telehealth: Payer: Self-pay | Admitting: Orthopaedic Surgery

## 2021-09-24 ENCOUNTER — Other Ambulatory Visit: Payer: Self-pay | Admitting: Orthopaedic Surgery

## 2021-09-24 MED ORDER — HYDROCODONE-ACETAMINOPHEN 7.5-325 MG PO TABS: 1.0000 | ORAL_TABLET | Freq: Four times a day (QID) | ORAL | 0 refills | Status: DC | PRN

## 2021-09-24 NOTE — Telephone Encounter (Signed)
Patient called stating that he has been taking oxycodone and it's not helping with the pain, he would like to know if he could get hydrocodone instead sent into his pharmacy. CB # [phone]

## 2021-09-28 ENCOUNTER — Encounter: Payer: Self-pay | Admitting: Orthopaedic Surgery

## 2021-09-28 ENCOUNTER — Ambulatory Visit (INDEPENDENT_AMBULATORY_CARE_PROVIDER_SITE_OTHER): Payer: HMO | Admitting: Orthopaedic Surgery

## 2021-09-28 ENCOUNTER — Encounter (HOSPITAL_COMMUNITY): Payer: HMO | Admitting: Physical Therapy

## 2021-09-28 DIAGNOSIS — Z96652 Presence of left artificial knee joint: Secondary | ICD-10-CM

## 2021-09-28 NOTE — Progress Notes (Signed)
The patient is 6 weeks now status post revision arthroplasty with removal of an antibiotic space from a previously infected total knee with MRSA.  He has a knee revision in the knee now and is taking doxycycline twice a day.  He has been wearing a lymphedema sleeve and has not been to as much therapy as he had hoped to but overall though he feels like he is doing well.  We have tried to wean him from oxycodone to hydrocodone he says is not as strong but has not taken much either.  He is walking without assistive device.  On exam today his knee incision looks good.  There is no redness.  His extension is full and his flexion is only to about 90 degrees but I feel like he can get beyond this as well.  He also agrees with this.  We will see him back in 4 weeks to see how he is doing overall.  At that visit I would like a standing AP and lateral of his left knee.  My plan is to keep him on oral antibiotics for 6 months to a year postop.

## 2021-09-30 ENCOUNTER — Encounter (HOSPITAL_COMMUNITY): Payer: HMO | Admitting: Physical Therapy

## 2021-10-02 ENCOUNTER — Encounter (HOSPITAL_COMMUNITY): Payer: HMO

## 2021-10-07 ENCOUNTER — Ambulatory Visit (HOSPITAL_COMMUNITY): Payer: HMO | Attending: Orthopaedic Surgery | Admitting: Physical Therapy

## 2021-10-07 DIAGNOSIS — M25562 Pain in left knee: Secondary | ICD-10-CM | POA: Insufficient documentation

## 2021-10-07 DIAGNOSIS — M6281 Muscle weakness (generalized): Secondary | ICD-10-CM | POA: Insufficient documentation

## 2021-10-07 DIAGNOSIS — M25662 Stiffness of left knee, not elsewhere classified: Secondary | ICD-10-CM | POA: Insufficient documentation

## 2021-10-07 NOTE — Addendum Note (Signed)
Addended by: Josue Hector A on: 10/07/2021 04:39 PM   Modules accepted: Orders

## 2021-10-07 NOTE — Therapy (Addendum)
OUTPATIENT PHYSICAL THERAPY TREATMENT Progress Note Reporting Period 08/31/21 to 10/07/21  See note below for Objective Data and Assessment of Progress/Goals.     Patient Name: [name] MRN: [id number] DOB:09-22-1955, 66 y.o., male Today's Date: 10/07/2021   PT End of Session - 10/07/21 1321     Visit Number 7    Number of Visits 15    Date for PT Re-Evaluation 11/13/21    Authorization Type [employer] (visits medicare guidelines, No auth)    Progress Note Due on Visit 15    PT Start Time 1258    PT Stop Time 1332    PT Time Calculation (min) 34 min    Activity Tolerance Patient tolerated treatment well    Behavior During Therapy [facility] for tasks assessed/performed                 Past Medical History:  Diagnosis Date   Arthritis    Diabetes mellitus without complication ([place]) 8250   Diabetes Type II   GERD (gastroesophageal reflux disease)    diet controlled   HTN (hypertension) 10/27/2018   Hyperlipidemia    Internal hemorrhoids 2014   noted on colonoscopy   MSSA (methicillin susceptible Staphylococcus aureus) infection 09/03/2020   Osteoarthritis of both knees    Prostate cancer ([place])    prostate   Sigmoid diverticulosis 2014   Mild, noted on colonoscopy   Skin lesion 01/01/2021   Type 2 diabetes mellitus with other specified complication ([place]) 03/70/4888   Vaccine counseling 05/04/2021   Past Surgical History:  Procedure Laterality Date   CARPAL TUNNEL RELEASE Right 02/22/2019   Procedure: CARPAL TUNNEL RELEASE;  Surgeon: Carole Civil, MD;  Location: [facility];  Service: Orthopedics;  Laterality: Right;   COLONOSCOPY N/A 09/13/2012   Procedure: COLONOSCOPY;  Surgeon: Danie Binder, MD;  Location: [facility];  Service: Endoscopy;  Laterality: N/A;  9:30 AM   CYSTOSCOPY WITH URETHRAL DILATATION N/A 06/02/2020   Procedure: CYSTOSCOPY WITH URETHRAL DILATATION;  Surgeon: [name], MD;  Location: [facility];  Service: Urology;   Laterality: N/A;  ONLY NEEDS 30 MIN   EXCISIONAL TOTAL KNEE ARTHROPLASTY WITH ANTIBIOTIC SPACERS Left 05/15/2021   Procedure: LEFT TOTAL KNEE IRRIGATION AND DEBRIDEMENT, EXCISION LEFT TOTAL KNEE ARTHROPLASTY WITH ANTIBIOTIC SPACERS;  Surgeon: Mcarthur Rossetti, MD;  Location: [facility];  Service: Orthopedics;  Laterality: Left;   HERNIA REPAIR     20 years ago   I & D KNEE WITH POLY EXCHANGE Left 08/14/2020   Procedure: IRRIGATION AND DEBRIDEMENT LEFT KNEE with synovectomy WITH POLY EXCHANGE;  Surgeon: Mcarthur Rossetti, MD;  Location: [place];  Service: Orthopedics;  Laterality: Left;   KNEE ARTHROSCOPY Left    15 years ago   LYMPHADENECTOMY Bilateral 05/30/2017   Procedure: LYMPHADENECTOMY, PELVIC EXTENDED;  Surgeon: [name], MD;  Location: [facility];  Service: Urology;  Laterality: Bilateral;   ROBOT ASSISTED LAPAROSCOPIC RADICAL PROSTATECTOMY N/A 05/30/2017   Procedure: XI ROBOTIC ASSISTED LAPAROSCOPIC RADICAL PROSTATECTOMY LEVEL 3;  Surgeon: [name], MD;  Location: [facility];  Service: Urology;  Laterality: N/A;   SHOULDER ARTHROSCOPY Right    15 years ago   TOTAL KNEE ARTHROPLASTY Left 03/21/2019   Procedure: LEFT TOTAL KNEE ARTHROPLASTY;  Surgeon: Mcarthur Rossetti, MD;  Location: [facility];  Service: Orthopedics;  Laterality: Left;   TOTAL KNEE REVISION Left 08/14/2021   Procedure: REMOVAL OF ANTIBIOTIC SPACER AND LEFT TOTAL KNEE REVISION ARTHROPLASTY;  Surgeon: Mcarthur Rossetti, MD;  Location:  [facility];  Service: Orthopedics;  Laterality: Left;   Patient Active Problem List   Diagnosis Date Noted   Status post revision of total replacement of left knee 08/27/2021   Status post revision of total knee replacement, left 08/14/2021   PICC (peripherally inserted central catheter) in place 06/09/2021   Vaccine counseling 05/04/2021   Skin lesion 01/01/2021   Type 2 diabetes mellitus with other specified complication ([place]) 32/44/0102   Nausea and vomiting 09/11/2020    MSSA (methicillin susceptible Staphylococcus aureus) infection 09/03/2020   Effusion, left knee 08/14/2020   Infection of total left knee replacement ([place]) 08/14/2020   Status post total left knee replacement 03/21/2019   S/P carpal tunnel release right 02/22/19 03/08/2019   Carpal tunnel syndrome of right wrist    HTN (hypertension) 10/27/2018   Prostate cancer ([place]) 05/30/2017   Malignant neoplasm of prostate ([place]) 04/28/2017   Chronic pain of both knees 02/21/2017   Unilateral primary osteoarthritis, left knee 02/21/2017   Unilateral primary osteoarthritis, right knee 02/21/2017    PCP: [name] MD  REFERRING PROVIDER: Mcarthur Rossetti, MD   REFERRING DIAG: [phone] (ICD-10-CM) - Status post revision of total replacement of left knee   THERAPY DIAG:  Left knee pain, unspecified chronicity  Stiffness of left knee, not elsewhere classified  Muscle weakness (generalized)  Rationale for Evaluation and Treatment Rehabilitation   ONSET DATE: 08/14/21  SUBJECTIVE:   SUBJECTIVE STATEMENT:  Pt states he has been busy and on vacation.  States his swelling is up today due to driving back from [place].  Reports he is compliant with wearing his thigh high compression everyday on his Lt LE.  States he has not been using his pump, however. Reports no change in his ability to bend his knee.   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 No and Yes: NPRS scale: 5/10 Pain location: left knee Pain description: stiffness  Aggravating factors: movement Relieving factors: rest, ice  PRECAUTIONS: None  WEIGHT BEARING RESTRICTIONS No  FALLS:  Has patient fallen in last 6 months? No  LIVING ENVIRONMENT: Lives with: lives with their spouse Lives in: House/apartment Stairs: Yes: External: 3-4 steps; on right going up Has following  equipment at home: Single point cane, Environmental consultant - 2 wheeled, and Crutches  OCCUPATION: Theme park manager  PLOF: Independent  PATIENT GOALS Get knee back to being right   OBJECTIVE:  Observation: LLE lymphedema   PATIENT SURVEYS:  FOTO  60.4% (was 53% functional status)  COGNITION:  Overall cognitive status: Within functional limits for tasks assessed     SENSATION: WFL  POSTURE: No Significant postural limitations  PALPATION: Slight tenderness throughout LLE  LOWER EXTREMITY ROM:  Active ROM Right eval Left eval Left 09/02/21 Left 10/07/21  Knee flexion 117 68 78 78  Knee extension 0 0 0 0    LOWER EXTREMITY MMT:  MMT Right eval Left eval Left 10/07/21  Hip flexion 5/5 4+/5 5  Hip extension     Hip abduction     Hip adduction     Hip internal rotation     Hip external rotation     Knee flexion 5/5 4+/5 5  Knee extension 5/5 4+/5 5  Ankle dorsiflexion 5/5 5/5 5  Ankle plantarflexion     Ankle inversion     Ankle eversion      (Blank rows =  not tested)  Left Lower Extremity Lymphedema Final on 05/21/19 09/02/21 10/02/21  20 cm Proximal to Suprapatella  54 cm   56.5 05/02/19 57.5 cm 55.1  10 cm Proximal to Suprapatella  49 cm   52.3 05/02/19 52.5 cm 51.2  At Midpatella/Popliteal Crease  43.7 cm   44.2 05/02/19 47 cm 47  30 cm Proximal to Floor at Lateral Plantar Foot  36.2 cm   38.2 05/02/19 43.3 cm 43.8  20 cm Proximal to Floor at Lateral Plantar Foot  27 cm   28.2 05/02/19 36.2 cm 37.5  10 cm Proximal to Floor at Lateral Malleoli  25.3 cm   27.3 05/02/19 31 cm 31.2  Circumference of ankle/heel  34 cm.   35.5 05/02/19 37 cm 38  5 cm Proximal to 1st MTP Joint  22.8 cm  23.7 05/02/19 24 cm 25  Across MTP Joint  23 cm   23.8 05/02/19 23.8 cm 23.6         FUNCTIONAL TESTS:  Retested 10/07/21 and compared to evaluation measures from 08/31/21 5 times sit to stand: 8.58 seconds (was 11.5 seconds, relies on RLE with L knee in extension) 2 minute walk test: 492 feet (was 400  feet) Stairs: Able to complete reciprocally up and down with 1 UE assist, min gait deviation.  GAIT: Distance walked: 400 Assistive device utilized: None Level of assistance: Complete Independence Comments: 2MWT. Limited knee flexion, antalgic on L    TODAY'S TREATMENT: 10/07/21 Re-assessment completed FOTO, goal review, MMT, ROM and test measures Contract relax for Lt knee flexion sitting edge of mat 2X Knee flexion AROM in supine 78 degrees  09/17/21 Rec bike 5 min (seat 15) for ROM  Calf stretch 3 x 30"  Heel raise 2 x10 Hamstring stretch on box 6 inch 3 x 30" Step ups 6 inch 2 x 10 no HHA  Step down 6 inch 2 x 10  no HHA Single leg stance 3 x 15" each  TKE 3 plates   Sit to stand 2 x 10  Leg press 3 plates 2 x 10   Clinic ambulation 1 RT no AD  Manual scar tissue massage, patellar mobs (supine); seated knee flexion PROM with contract/ relax (up to 82 degrees PROM)  Limited to 76 degrees knee flexion in supine due to pain   09/14/21 Rec bike seat 16 5 min for AROM  Heel raise x 20 Calf stretch 3 x 30" Knee drive 8 inch box 5 x 10" Hamstring stretch on 8 inch box 5 x 10" Step ups 6 inch x 20 HHA x 2 Step down 6 inch 2 x 10 HHA x 2  Seated dip x10  Seated knee flexion AAROM 10 x 5" Sit to stand 2 x 10 Clinic ambulation 1 RT no AD  Supine SLR Heel slide with strap   Manual edema massage and knee flexion PROM    Lt knee AROM 0-76 deg  09/11/21 Bike seat 16 1/2 revolutions x 5 min  Standing: // bars Heel raises x 20 no UE hold Slant board 5 x 20" Knee drives on 8 inch box x 2' Hamstring stretch on box 5 x 20" Squats  2 x 10 to chair seat height  Seated Step dips for knee flexion x 2' STM and manual knee flexion x 10; no other treatment performed during manual treatment.  PATIENT EDUCATION:  Education details: 6/14:  Lymphedema education and how untreated lymphedema leads to cellulitis and infection; reduction is through bandaging; imperative  to wear  thigh high compression;  evaluation: Patient educated on exam findings, POC, scope of PT, HEP, avoiding compensation, and getting compression garments. Person educated: Patient Education method: Explanation, Demonstration, and Handouts Education comprehension: verbalized understanding, returned demonstration, verbal cues required, and tactile cues required  HOME EXERCISE PROGRAM: 6/12/23Access Code: [email or web address] - Supine Heel Slide with Strap  - 5 x daily - 7 x weekly - 2 sets - 10 reps - 5-10 second hold - Seated Heel Slide  - 5 x daily - 7 x weekly - 2 sets - 10 reps - 5-10 second hold - Active Straight Leg Raise with Quad Set  - 3 x daily - 7 x weekly - 3 sets - 10 reps - Sit to Stand with Arms Crossed  - 3 x daily - 7 x weekly - 2 sets - 10 reps  ASSESSMENT:  CLINICAL IMPRESSION: Reassessment completed this session.  PT has made gains in strength and overall function.  ROM remains at 78 degrees with continued edema in Lt LE.  Discussed using his lymphedema pump as this will help to reduce the induration.  Pt instructed to use his pump 2X day and explained that ice will not help his edema.  Reviewed self stretching side of mat and shown how to complete contract relax technique with assist of Rt LE.  Pt has met all short term goals and 3/5 Long term goals.  Patient will continue to benefit from skilled therapy services to reduce remaining deficits and improve functional ability.     OBJECTIVE IMPAIRMENTS Abnormal gait, decreased activity tolerance, decreased mobility, difficulty walking, decreased ROM, decreased strength, increased edema, impaired flexibility, improper body mechanics, and pain.   ACTIVITY LIMITATIONS lifting, bending, standing, squatting, sleeping, stairs, transfers, locomotion level, and caring for others  PARTICIPATION LIMITATIONS: meal prep, cleaning, shopping, community activity, and yard work  PERSONAL FACTORS Past/current experiences, Time since onset of  injury/illness/exacerbation, and 3+ comorbidities: chronic lymphedema, infection with MRSA causing 2 part revision of TKA, Adenocarcinoma of prostate with lymphectomy of 38 nodes, DM, HTN, HLD  are also affecting patient's functional outcome.   REHAB POTENTIAL: Good  CLINICAL DECISION MAKING: Stable/uncomplicated  EVALUATION COMPLEXITY: Low   GOALS: Goals reviewed with patient? Yes  SHORT TERM GOALS: Target date: 09/21/2021  Patient will be independent with HEP in order to improve functional outcomes. Baseline:  Goal status: MET  2.  Patient will report at least 25% improvement in symptoms for improved quality of life. Baseline:  Goal status: MET   LONG TERM GOALS: Target date: 10/12/2021  Patient will report at least 75% improvement in symptoms for improved quality of life. Baseline:  Goal status: IN PROGRESS  2.Patient will be able to complete 5x STS in under 11.4 seconds without compensation in order to demonstrate improved functional strength. Baseline: 8.58 seconds relies on RLE with L knee in extension Goal status: MET  3.  Patient will be able to navigate stairs with reciprocal pattern without compensation in order to demonstrate improved LE strength. Baseline: see above Goal status: MET  4.  Patient will be able to ambulate at least 450 feet in 2MWT in order to demonstrate improved tolerance to activity. Baseline: 400 feet Goal status: MET  5.  Patient will improve ROM for left knee extension/flexion to 0-110 degrees to improve squatting, and other functional mobility. Baseline: 0-78 deg Goal status: IN PROGRESS    PLAN: PT FREQUENCY: 2x/week  PT DURATION: 4 weeks  PLANNED INTERVENTIONS: Therapeutic exercises, Therapeutic activity, Neuromuscular re-education,  Balance training, Gait training, Patient/Family education, Joint manipulation, Joint mobilization, Stair training, Orthotic/Fit training, DME instructions, Aquatic Therapy, Dry Needling, Electrical  stimulation, Spinal manipulation, Spinal mobilization, Cryotherapy, Moist heat, Compression bandaging, scar mobilization, Splintting, Taping, Traction, Ultrasound, Ionotophoresis 74m/ml Dexamethasone, and Manual therapy   PLAN FOR NEXT SESSION:   Continue to focus on improving Lt knee mobility and strength, manual to reduce edema and education.  Progress as tolerated  ATeena Irani PTA/CLT CCopperas CovePh: [phone] 4:38 PM, 10/07/21

## 2021-10-08 ENCOUNTER — Encounter (HOSPITAL_COMMUNITY): Payer: PPO | Admitting: Physical Therapy

## 2021-10-13 ENCOUNTER — Telehealth (HOSPITAL_COMMUNITY): Payer: Self-pay | Admitting: Physical Therapy

## 2021-10-13 NOTE — Telephone Encounter (Signed)
He called to cx his knee is very swollen and he wants to see Ninfa Linden MD before he returns to PT. Pt will call to schedule more apptments after he see's MD.

## 2021-10-14 ENCOUNTER — Encounter (HOSPITAL_COMMUNITY): Payer: PPO | Admitting: Physical Therapy

## 2021-10-19 ENCOUNTER — Other Ambulatory Visit: Payer: Self-pay | Admitting: *Deleted

## 2021-10-19 DIAGNOSIS — I890 Lymphedema, not elsewhere classified: Secondary | ICD-10-CM

## 2021-10-21 ENCOUNTER — Other Ambulatory Visit: Payer: Self-pay | Admitting: *Deleted

## 2021-10-21 NOTE — Patient Outreach (Signed)
  Care Coordination   10/21/2021 Name: Aero Drummonds MRN: [id number] DOB: [birth year]   Care Coordination Outreach Attempts:  An unsuccessful telephone outreach was attempted today to offer the patient information about available care coordination services as a benefit of their health plan.   Follow Up Plan:  Additional outreach attempts will be made to offer the patient care coordination information and services.   Encounter Outcome:  No Answer  Care Coordination Interventions Activated:  No   Care Coordination Interventions:  No, not indicated  Unsuccessful call.  SIG Shawnese Magner C. Myrtie Neither, MSN, [facility] Gerontological Nurse Practitioner [facility] Care Management [phone]

## 2021-10-23 ENCOUNTER — Ambulatory Visit: Payer: Self-pay | Admitting: *Deleted

## 2021-10-23 NOTE — Patient Outreach (Signed)
  Care Coordination   10/23/2021 Name: [name] MRN: [id number] DOB: 01-24-1956   Care Coordination Outreach Attempts:  A second unsuccessful outreach was attempted today to offer the patient with information about available care coordination services as a benefit of their health plan.     Follow Up Plan:  Additional outreach attempts will be made to offer the patient care coordination information and services.   Encounter Outcome:  No Answer  Care Coordination Interventions Activated:  No   Care Coordination Interventions:  No, not indicated    SIG Ashima Shrake C. Myrtie Neither, MSN, [facility] Gerontological Nurse Practitioner [facility] Care Management [phone]

## 2021-10-28 ENCOUNTER — Encounter (HOSPITAL_COMMUNITY): Payer: PPO | Admitting: Physical Therapy

## 2021-10-29 ENCOUNTER — Encounter (HOSPITAL_COMMUNITY): Payer: PPO

## 2021-11-04 ENCOUNTER — Encounter: Payer: HMO | Admitting: Orthopaedic Surgery

## 2021-11-06 ENCOUNTER — Ambulatory Visit (HOSPITAL_COMMUNITY): Payer: HMO

## 2021-11-10 NOTE — Progress Notes (Deleted)
[facility] [place]   Reason for referral: Swollen *** leg  History of Present Illness  [name] is a 66 y.o. male who presents with chief complaint: swollen leg.  Patient notes, onset of swelling *** months ago, associated with ***.  The patient has had *** history of DVT, *** history of varicose vein, *** history of venous stasis ulcers, *** history of  Lymphedema and *** history of skin changes in lower legs.  There is *** family history of venous disorders.  The patient has *** used compression stockings in the past.  Past Medical History:  Diagnosis Date   Arthritis    Diabetes mellitus without complication ([place]) 0630   Diabetes Type II   GERD (gastroesophageal reflux disease)    diet controlled   HTN (hypertension) 10/27/2018   Hyperlipidemia    Internal hemorrhoids 2014   noted on colonoscopy   MSSA (methicillin susceptible Staphylococcus aureus) infection 09/03/2020   Osteoarthritis of both knees    Prostate cancer ([place])    prostate   Sigmoid diverticulosis 2014   Mild, noted on colonoscopy   Skin lesion 01/01/2021   Type 2 diabetes mellitus with other specified complication ([place]) 16/03/930   Vaccine counseling 05/04/2021    Past Surgical History:  Procedure Laterality Date   CARPAL TUNNEL RELEASE Right 02/22/2019   Procedure: CARPAL TUNNEL RELEASE;  Surgeon: Carole Civil, MD;  Location: [facility];  Service: Orthopedics;  Laterality: Right;   COLONOSCOPY N/A 09/13/2012   Procedure: COLONOSCOPY;  Surgeon: Danie Binder, MD;  Location: [facility];  Service: Endoscopy;  Laterality: N/A;  9:30 AM   CYSTOSCOPY WITH URETHRAL DILATATION N/A 06/02/2020   Procedure: CYSTOSCOPY WITH URETHRAL DILATATION;  Surgeon: [name], MD;  Location: [facility];  Service: Urology;  Laterality: N/A;  ONLY NEEDS 30 MIN   EXCISIONAL TOTAL KNEE ARTHROPLASTY WITH ANTIBIOTIC SPACERS Left 05/15/2021   Procedure: LEFT TOTAL KNEE IRRIGATION AND DEBRIDEMENT,  EXCISION LEFT TOTAL KNEE ARTHROPLASTY WITH ANTIBIOTIC SPACERS;  Surgeon: Mcarthur Rossetti, MD;  Location: [facility];  Service: Orthopedics;  Laterality: Left;   HERNIA REPAIR     20 years ago   I & D KNEE WITH POLY EXCHANGE Left 08/14/2020   Procedure: IRRIGATION AND DEBRIDEMENT LEFT KNEE with synovectomy WITH POLY EXCHANGE;  Surgeon: Mcarthur Rossetti, MD;  Location: [facility];  Service: Orthopedics;  Laterality: Left;   KNEE ARTHROSCOPY Left    15 years ago   LYMPHADENECTOMY Bilateral 05/30/2017   Procedure: LYMPHADENECTOMY, PELVIC EXTENDED;  Surgeon: [name], MD;  Location: [facility];  Service: Urology;  Laterality: Bilateral;   ROBOT ASSISTED LAPAROSCOPIC RADICAL PROSTATECTOMY N/A 05/30/2017   Procedure: XI ROBOTIC ASSISTED LAPAROSCOPIC RADICAL PROSTATECTOMY LEVEL 3;  Surgeon: [name], MD;  Location: [facility];  Service: Urology;  Laterality: N/A;   SHOULDER ARTHROSCOPY Right    15 years ago   TOTAL KNEE ARTHROPLASTY Left 03/21/2019   Procedure: LEFT TOTAL KNEE ARTHROPLASTY;  Surgeon: Mcarthur Rossetti, MD;  Location: [facility];  Service: Orthopedics;  Laterality: Left;   TOTAL KNEE REVISION Left 08/14/2021   Procedure: REMOVAL OF ANTIBIOTIC SPACER AND LEFT TOTAL KNEE REVISION ARTHROPLASTY;  Surgeon: Mcarthur Rossetti, MD;  Location: [facility];  Service: Orthopedics;  Laterality: Left;    Social History   Socioeconomic History   Marital status: Married    Spouse name: Not on file   Number of children: 1   Years of education: Not on file   Highest  education level: Not on file  Occupational History   Occupation: pastor  Tobacco Use   Smoking status: Never   Smokeless tobacco: Never  Vaping Use   Vaping Use: Never used  Substance and Sexual Activity   Alcohol use: No   Drug use: No   Sexual activity: Yes  Other Topics Concern   Not on file  Social History Narrative   Doristine Bosworth   Married to [employer]   Daughter, Abby Gigi Gin, RN III, Resides in  [place]   Social Determinants of Health   Financial Resource Strain: Low Risk  (09/01/2020)   Overall Financial Resource Strain (CARDIA)    Difficulty of Paying Living Expenses: Not hard at all  Food Insecurity: No Food Insecurity (09/01/2020)   Hunger Vital Sign    Worried About Running Out of Food in the Last Year: Never true    [place] in the Last Year: Never true  Transportation Needs: No Transportation Needs (09/01/2020)   PRAPARE - Hydrologist (Medical): No    Lack of Transportation (Non-Medical): No  Physical Activity: Not on file  Stress: No Stress Concern Present (09/01/2020)   [place]    Feeling of Stress : Not at all  Social Connections: Not on file  Intimate Partner Violence: Not At Risk (09/01/2020)   Humiliation, Afraid, Rape, and Kick questionnaire    Fear of Current or Ex-Partner: No    Emotionally Abused: No    Physically Abused: No    Sexually Abused: No    Family History  Problem Relation Age of Onset   Cancer Mother        small cell lung    Current Outpatient Medications on File Prior to Visit  Medication Sig Dispense Refill   aspirin 81 MG chewable tablet Chew 1 tablet (81 mg total) by mouth 2 (two) times daily. 60 tablet 0   Continuous Blood Gluc Sensor (FREESTYLE LIBRE 14 DAY SENSOR) MISC SMARTSIG:Topical Every 10 Days     diclofenac (VOLTAREN) 75 MG EC tablet Take 1 tablet (75 mg total) by mouth 2 (two) times daily between meals as needed. 60 tablet 3   doxycycline (VIBRA-TABS) 100 MG tablet Take 1 tablet (100 mg total) by mouth 2 (two) times daily. Take on full stomach.wear sunscreen while on this medication 60 tablet 3   escitalopram (LEXAPRO) 10 MG tablet Take 10 mg by mouth daily.     fluorouracil (EFUDEX) 5 % cream Apply 1 application. topically 2 (two) times daily as needed (cancer spots).     HYDROcodone-acetaminophen (NORCO) 7.5-325 MG  tablet Take 1-2 tablets by mouth every 6 (six) hours as needed for moderate pain. 30 tablet 0   insulin aspart (NOVOLOG) 100 UNIT/ML injection Inject 10-20 Units into the skin 3 (three) times daily with meals. Per sliding scale     losartan-hydrochlorothiazide (HYZAAR) 50-12.5 MG tablet TAKE 1 TABLET BY MOUTH EVERY DAY 90 tablet 1   methocarbamol (ROBAXIN) 500 MG tablet Take 1 tablet (500 mg total) by mouth every 8 (eight) hours as needed for muscle spasms. 40 tablet 1   Omega-3 Fatty Acids (FISH OIL MAXIMUM STRENGTH) 1200 MG CAPS Take 1,200 mg by mouth daily.     ondansetron (ZOFRAN) 4 MG tablet Take 1 tablet (4 mg total) by mouth every 8 (eight) hours as needed for nausea or vomiting. 90 tablet 2   polyethylene glycol (MIRALAX / GLYCOLAX) 17 g packet  Take 17 g by mouth daily. 14 each 0   rosuvastatin (CRESTOR) 20 MG tablet TAKE 1 TABLET BY MOUTH EVERY DAY (Patient taking differently: Take 20 mg by mouth daily.) 90 tablet 1   senna (SENOKOT) 8.6 MG TABS tablet Take 1 tablet (8.6 mg total) by mouth 2 (two) times daily. 120 tablet 0   tiZANidine (ZANAFLEX) 4 MG tablet Take 1 tablet (4 mg total) by mouth every 8 (eight) hours as needed for muscle spasms. 40 tablet 0   TRESIBA FLEXTOUCH 100 UNIT/ML SOPN FlexTouch Pen Inject 80 Units into the skin daily. Pt takes in the am     No current facility-administered medications on file prior to visit.    Allergies as of 11/10/2021   (No Known Allergies)     ROS:   General:  No weight loss, Fever, chills  HEENT: No recent headaches, no nasal bleeding, no visual changes, no sore throat  Neurologic: No dizziness, blackouts, seizures. No recent symptoms of stroke or mini- stroke. No recent episodes of slurred speech, or temporary blindness.  Cardiac: No recent episodes of chest pain/pressure, no shortness of breath at rest.  No shortness of breath with exertion.  Denies history of atrial fibrillation or irregular heartbeat  Vascular: No history of  rest pain in feet.  No history of claudication.  No history of non-healing ulcer, No history of DVT   Pulmonary: No home oxygen, no productive cough, no hemoptysis,  No asthma or wheezing  Musculoskeletal:  '[ ]'$  Arthritis, '[ ]'$  Low back pain,  '[ ]'$  Joint pain  Hematologic:No history of hypercoagulable state.  No history of easy bleeding.  No history of anemia  Gastrointestinal: No hematochezia or melena,  No gastroesophageal reflux, no trouble swallowing  Urinary: '[ ]'$  chronic Kidney disease, '[ ]'$  on HD - '[ ]'$  MWF or '[ ]'$  TTHS, '[ ]'$  Burning with urination, '[ ]'$  Frequent urination, '[ ]'$  Difficulty urinating;   Skin: No rashes  Psychological: No history of anxiety,  No history of depression  Physical Examination  There were no vitals filed for this visit.  There is no height or weight on file to calculate BMI.  General:  Alert and oriented, no acute distress HEENT: Normal Neck: No bruit or JVD Pulmonary: Clear to auscultation bilaterally Cardiac: Regular Rate and Rhythm without murmur Abdomen: Soft, non-tender, non-distended, no mass, no scars Skin: No rash Extremity Pulses:  2+ radial, brachial, femoral, dorsalis pedis, posterior tibial pulses bilaterally Musculoskeletal: No deformity or edema  Neurologic: Upper and lower extremity motor 5/5 and symmetric  DATA:  Assessment:  Plan:  Ruta Hinds, MD [facility] [place] Office: [phone] Pager: [phone]

## 2021-11-12 ENCOUNTER — Telehealth: Payer: Self-pay | Admitting: *Deleted

## 2021-11-12 NOTE — Patient Outreach (Signed)
  Care Coordination   Initial Visit Note   11/12/2021 Name: Greysyn Vanderberg MRN: [id number] DOB: 11/08/1955  Michaeal Davis is a 66 y.o. year old male who sees Avva, [name], MD for primary care. I spoke with  Lehman Prom by phone today  What matters to the patients health and wellness today?  "To maintain and continue doing well"   Placed call to patient, reviewed [facility] program, patient agreeable to today's outreach and declines any further outreach.    Goals Addressed             This Visit's Progress    "to maintain and continue doing well"       Care Coordination Interventions: Patient interviewed about adult health maintenance status including  Annual Wellness Visit Patient wanted to know what annual wellness visit is, RN care manager explained importance of AWV, pt states if he does not have up to date, he will schedule visit. Patient reports CBG readings are "good and AIC now 6.8" Patient states he is doing well with last knee revision and managing prostate cancer well Patient very appreciative of call today and declines any further outreach after today.          SDOH assessments and interventions completed:  Yes  SDOH Interventions Today    Flowsheet Row Most Recent Value  SDOH Interventions   Food Insecurity Interventions Intervention Not Indicated  Financial Strain Interventions Intervention Not Indicated  Transportation Interventions Intervention Not Indicated        Care Coordination Interventions Activated:  Yes  Care Coordination Interventions:  Yes, provided   Follow up plan: No further intervention required.   Encounter Outcome:  Pt. Visit Completed   Jacqlyn Larsen [facility], BSN RN Case Manager [phone]

## 2021-12-08 ENCOUNTER — Telehealth: Payer: Self-pay | Admitting: Orthopaedic Surgery

## 2021-12-08 NOTE — Telephone Encounter (Signed)
Spoke with patient and let him know [name] isn't even in the office today, we will see him tomorrow

## 2021-12-08 NOTE — Telephone Encounter (Signed)
Patients wife called on behalf of patient and said that he is having some issues with his left knee since his surgery. I made an appointment with Dr. Ninfa Linden for 9/20 but she wanted me to send a message back in case he needed to come in today possibly. States he's prone to infections. CB # [phone]

## 2021-12-09 ENCOUNTER — Encounter: Payer: Self-pay | Admitting: Orthopaedic Surgery

## 2021-12-09 ENCOUNTER — Ambulatory Visit (INDEPENDENT_AMBULATORY_CARE_PROVIDER_SITE_OTHER): Payer: HMO | Admitting: Orthopaedic Surgery

## 2021-12-09 ENCOUNTER — Ambulatory Visit (INDEPENDENT_AMBULATORY_CARE_PROVIDER_SITE_OTHER): Payer: HMO

## 2021-12-09 DIAGNOSIS — T8454XD Infection and inflammatory reaction due to internal left knee prosthesis, subsequent encounter: Secondary | ICD-10-CM | POA: Diagnosis not present

## 2021-12-09 DIAGNOSIS — Z96652 Presence of left artificial knee joint: Secondary | ICD-10-CM

## 2021-12-09 NOTE — Addendum Note (Signed)
Addended by: Jacklyn Shell on: 12/09/2021 01:19 PM   Modules accepted: Orders

## 2021-12-09 NOTE — Progress Notes (Signed)
HPI: [name] comes in today for left knee pain.  He states its been aching a lot lately and he has noted increased swelling particularly in the evenings.  He is wearing compression hose.  He notes some general malaise and chills at night but otherwise feels good during the day.  He states this all began about 4 to 5 days ago.  Prior to that he was doing a lot of walking in the woods and doing seeding of the lawns and fields.  Had no known injury.  Denies any fevers. He is status post revision arthroplasty removal and antibiotic spacer of the previous infected knee with MRSA 08/14/2021.  He had been doing well until just recently.  He denies any ongoing infections.  He is on chronic doxycycline 100 mg twice daily.   Physical exam: Left knee with slight edema.  Plus minus effusion.  No significant abnormal warmth.  No erythema.  Has full extension and flexion to approximately 90 to 95 degrees.  Calf supple nontender.  Radiographs: Left knee 2 views: Shows well-seated longstem components status post left knee revision without any complicating features.  No acute fractures noted.  Knee is well located.  Impression: Status post left knee revision with removal of antibiotic spacer  Plan: He will continue the compression stocking.  Continue to work on range of motion.  We will obtain a CRP, sed rate and CBC on him today.  We will see him back in 1 week to go over the labs and see how he is dressing.  Questions were encouraged and answered at length.

## 2021-12-10 ENCOUNTER — Encounter: Payer: Self-pay | Admitting: Orthopaedic Surgery

## 2021-12-10 LAB — CBC WITH DIFFERENTIAL/PLATELET
Absolute Monocytes: 535 cells/uL (ref 200–950)
Basophils Absolute: 13 cells/uL (ref 0–200)
Basophils Relative: 0.2 %
Eosinophils Absolute: 139 cells/uL (ref 15–500)
Eosinophils Relative: 2.1 %
HCT: 37.8 % — ABNORMAL LOW (ref 38.5–50.0)
Hemoglobin: 12.7 g/dL — ABNORMAL LOW (ref 13.2–17.1)
Lymphs Abs: 1148 cells/uL (ref 850–3900)
MCH: 29.7 pg (ref 27.0–33.0)
MCHC: 33.6 g/dL (ref 32.0–36.0)
MCV: 88.3 fL (ref 80.0–100.0)
MPV: 9.2 fL (ref 7.5–12.5)
Monocytes Relative: 8.1 %
Neutro Abs: 4765 cells/uL (ref 1500–7800)
Neutrophils Relative %: 72.2 %
Platelets: 204 10*3/uL (ref 140–400)
RBC: 4.28 10*6/uL (ref 4.20–5.80)
RDW: 14.4 % (ref 11.0–15.0)
Total Lymphocyte: 17.4 %
WBC: 6.6 10*3/uL (ref 3.8–10.8)

## 2021-12-10 LAB — C-REACTIVE PROTEIN: CRP: 138.9 mg/L — ABNORMAL HIGH (ref <8.0)

## 2021-12-10 LAB — SEDIMENTATION RATE: Sed Rate: 39 mm/h — ABNORMAL HIGH (ref 0–20)

## 2021-12-11 ENCOUNTER — Telehealth: Payer: Self-pay | Admitting: Orthopaedic Surgery

## 2021-12-11 NOTE — Telephone Encounter (Signed)
Called and worked pt in for wed of next week

## 2021-12-11 NOTE — Telephone Encounter (Signed)
Patient wife [name]) stated that Dr Ninfa Linden called her and wants [name] her husband to come in next week instead of Oct 4. There are no times open for Dr Ninfa Linden or Carlis Abbott. The best number to reach the wife [phone]

## 2021-12-16 ENCOUNTER — Encounter: Payer: Self-pay | Admitting: Orthopaedic Surgery

## 2021-12-16 ENCOUNTER — Ambulatory Visit (INDEPENDENT_AMBULATORY_CARE_PROVIDER_SITE_OTHER): Payer: HMO | Admitting: Orthopaedic Surgery

## 2021-12-16 DIAGNOSIS — Z96652 Presence of left artificial knee joint: Secondary | ICD-10-CM | POA: Diagnosis not present

## 2021-12-16 NOTE — Progress Notes (Signed)
HPI: Mr. Mazzoni returns today for follow-up of his left knee.  He states he still having pain in the knee particularly at night.  Feels his had some chills but no fevers.  He states he just does not feel quite right. Lab work on 12/23/2021 showed his white count to be 6.6, C-reactive protein was elevated at 138.9 mg/L and sed rate was elevated at 39 mm/h.  Review of systems: See HPI otherwise negative or noncontributory.  Physical exam: Left knee slight edema no abnormal warmth or erythema.  Very slight effusion.  Left knee is prepped with Betadine and ethyl chloride.  Then aspirate is obtained total of 20 cc of serosanguineous aspirate was obtained.  Patient tolerates well.  Impression: Status post left knee revision with removal of antibiotic spacer Acute left knee pain swelling  Plan: We will send the aspirate off for culture sensitivity, Gram stain and cell count.  We will be in touch with him about the results and our plan of care.  Dr. Delilah Shan discussed with him that we may end up having to at least perform MRI of the knee and poly exchange.  Questions were encouraged and answered.

## 2021-12-18 ENCOUNTER — Telehealth: Payer: Self-pay | Admitting: Orthopaedic Surgery

## 2021-12-18 NOTE — Telephone Encounter (Signed)
Can you check on this on Monday please. Results still arent in chart

## 2021-12-18 NOTE — Telephone Encounter (Signed)
Patient's wife Diane called asked if they can get a call back with the lab results. The number to contact Diane is [phone]

## 2021-12-21 NOTE — Telephone Encounter (Signed)
Please advise 

## 2021-12-21 NOTE — Telephone Encounter (Signed)
Called and r/s appt

## 2021-12-22 LAB — CELL COUNT + DIFF, W/O CRYST-SYNVL FLD
Basophils, %: 0 %
Eosinophils-Synovial: 0 % (ref 0–2)
Lymphocytes-Synovial Fld: 13 % (ref 0–74)
Monocyte/Macrophage: 0 % (ref 0–69)
Neutrophil, Synovial: 87 % — ABNORMAL HIGH (ref 0–24)
Synoviocytes, %: 0 % (ref 0–15)
WBC, Synovial: 5371 cells/uL — ABNORMAL HIGH (ref <150)

## 2021-12-22 LAB — ANAEROBIC AND AEROBIC CULTURE
AER RESULT:: NO GROWTH
MICRO NUMBER:: 13975622
MICRO NUMBER:: 13975623
SPECIMEN QUALITY:: ADEQUATE
SPECIMEN QUALITY:: ADEQUATE

## 2021-12-23 ENCOUNTER — Encounter: Payer: HMO | Admitting: Orthopaedic Surgery

## 2021-12-24 NOTE — Progress Notes (Signed)
[facility]ASC[facility]           [facility]  History and Physical   [name] is a 66 y.o. male who presents with LLE swelling/lymphedema.  He states that his wife wanted him to be evaluated for his left leg swelling.  He states that several years ago, he was diagnosed with prostate cancer and had around 29 lymph nodes removed and since then, he has had swelling of the left leg.  He states that he does wear thigh high compression and uses a compression device at night.  He states this works well.  He states that his leg looks good today but did not wear his compression due to getting u/s.  He was planning to drive to [facility] today for new compression socks. He does not have any hx of DVT.  There is no venous family hx that he is aware of.  He denies any claudication or non healing wounds.  He does not have much swelling in the right leg but does wear a knee high compression sock on the right.    He was recently seen by orthopedics for swelling of the left knee, which was aspirated with return of ~ 20cc fluid.  He is has hx of left knee surgery x 4 with hx of infection and PICC line in the past for infection.   He has hx of DM.   The pt is on a statin for cholesterol management.  The pt is on a daily aspirin.   Other AC:  none The pt is on ARB, HCTZ for hypertension.   The pt is diabetic.   Tobacco hx:  never  Pt does not have family hx of AAA.  He is a [employer] manager.   Past Medical History:  Diagnosis Date   Arthritis    Diabetes mellitus without complication ([place]) 0626   Diabetes Type II   GERD (gastroesophageal reflux disease)    diet controlled   HTN (hypertension) 10/27/2018   Hyperlipidemia    Internal hemorrhoids 2014   noted on colonoscopy   MSSA (methicillin susceptible Staphylococcus aureus) infection 09/03/2020   Osteoarthritis of both knees    Prostate cancer ([place])    prostate   Sigmoid diverticulosis 2014   Mild, noted on colonoscopy   Skin  lesion 01/01/2021   Type 2 diabetes mellitus with other specified complication ([place]) 94/85/4627   Vaccine counseling 05/04/2021    Past Surgical History:  Procedure Laterality Date   CARPAL TUNNEL RELEASE Right 02/22/2019   Procedure: CARPAL TUNNEL RELEASE;  Surgeon: Carole Civil, MD;  Location: [facility];  Service: Orthopedics;  Laterality: Right;   COLONOSCOPY N/A 09/13/2012   Procedure: COLONOSCOPY;  Surgeon: Danie Binder, MD;  Location: [facility];  Service: Endoscopy;  Laterality: N/A;  9:30 AM   CYSTOSCOPY WITH URETHRAL DILATATION N/A 06/02/2020   Procedure: CYSTOSCOPY WITH URETHRAL DILATATION;  Surgeon: [name], MD;  Location: [facility];  Service: Urology;  Laterality: N/A;  ONLY NEEDS 30 MIN   EXCISIONAL TOTAL KNEE ARTHROPLASTY WITH ANTIBIOTIC SPACERS Left 05/15/2021   Procedure: LEFT TOTAL KNEE IRRIGATION AND DEBRIDEMENT, EXCISION LEFT TOTAL KNEE ARTHROPLASTY WITH ANTIBIOTIC SPACERS;  Surgeon: Mcarthur Rossetti, MD;  Location: [facility];  Service: Orthopedics;  Laterality: Left;   HERNIA REPAIR     20 years ago   I & D KNEE WITH POLY EXCHANGE Left 08/14/2020   Procedure: IRRIGATION AND DEBRIDEMENT LEFT KNEE with  synovectomy WITH POLY EXCHANGE;  Surgeon: Mcarthur Rossetti, MD;  Location: [facility];  Service: Orthopedics;  Laterality: Left;   KNEE ARTHROSCOPY Left    15 years ago   LYMPHADENECTOMY Bilateral 05/30/2017   Procedure: LYMPHADENECTOMY, PELVIC EXTENDED;  Surgeon: [name], MD;  Location: [facility];  Service: Urology;  Laterality: Bilateral;   ROBOT ASSISTED LAPAROSCOPIC RADICAL PROSTATECTOMY N/A 05/30/2017   Procedure: XI ROBOTIC ASSISTED LAPAROSCOPIC RADICAL PROSTATECTOMY LEVEL 3;  Surgeon: [name], MD;  Location: [facility];  Service: Urology;  Laterality: N/A;   SHOULDER ARTHROSCOPY Right    15 years ago   TOTAL KNEE ARTHROPLASTY Left 03/21/2019   Procedure: LEFT TOTAL KNEE ARTHROPLASTY;  Surgeon: Mcarthur Rossetti, MD;  Location: [facility];   Service: Orthopedics;  Laterality: Left;   TOTAL KNEE REVISION Left 08/14/2021   Procedure: REMOVAL OF ANTIBIOTIC SPACER AND LEFT TOTAL KNEE REVISION ARTHROPLASTY;  Surgeon: Mcarthur Rossetti, MD;  Location: [facility];  Service: Orthopedics;  Laterality: Left;    Social History   Socioeconomic History   Marital status: Married    Spouse name: Not on file   Number of children: 1   Years of education: Not on file   Highest education level: Not on file  Occupational History   Occupation: pastor  Tobacco Use   Smoking status: Never   Smokeless tobacco: Never  Vaping Use   Vaping Use: Never used  Substance and Sexual Activity   Alcohol use: No   Drug use: No   Sexual activity: Yes  Other Topics Concern   Not on file  Social History Narrative   Doristine Bosworth   Married to [employer]   Daughter, Abby Gigi Gin, RN III, Resides in [place]   Social Determinants of Health   Financial Resource Strain: Low Risk  (11/12/2021)   Overall Financial Resource Strain (CARDIA)    Difficulty of Paying Living Expenses: Not hard at all  Food Insecurity: No Food Insecurity (11/12/2021)   Hunger Vital Sign    Worried About Running Out of Food in the Last Year: Never true    [place] in the Last Year: Never true  Transportation Needs: No Transportation Needs (11/12/2021)   PRAPARE - Hydrologist (Medical): No    Lack of Transportation (Non-Medical): No  Physical Activity: Not on file  Stress: No Stress Concern Present (09/01/2020)   [place]    Feeling of Stress : Not at all  Social Connections: Not on file  Intimate Partner Violence: Not At Risk (09/01/2020)   Humiliation, Afraid, Rape, and Kick questionnaire    Fear of Current or Ex-Partner: No    Emotionally Abused: No    Physically Abused: No    Sexually Abused: No     Family History  Problem Relation Age of Onset   Cancer Mother         small cell lung    Current Outpatient Medications  Medication Sig Dispense Refill   aspirin 81 MG chewable tablet Chew 1 tablet (81 mg total) by mouth 2 (two) times daily. 60 tablet 0   Continuous Blood Gluc Sensor (FREESTYLE LIBRE 14 DAY SENSOR) MISC SMARTSIG:Topical Every 10 Days     diclofenac (VOLTAREN) 75 MG EC tablet Take 1 tablet (75 mg total) by mouth 2 (two) times daily between meals as needed. 60 tablet 3   doxycycline (VIBRA-TABS) 100 MG tablet Take 1 tablet (100 mg total) by  mouth 2 (two) times daily. Take on full stomach.wear sunscreen while on this medication 60 tablet 3   escitalopram (LEXAPRO) 10 MG tablet Take 10 mg by mouth daily.     fluorouracil (EFUDEX) 5 % cream Apply 1 application. topically 2 (two) times daily as needed (cancer spots).     HYDROcodone-acetaminophen (NORCO) 7.5-325 MG tablet Take 1-2 tablets by mouth every 6 (six) hours as needed for moderate pain. 30 tablet 0   insulin aspart (NOVOLOG) 100 UNIT/ML injection Inject 10-20 Units into the skin 3 (three) times daily with meals. Per sliding scale     losartan-hydrochlorothiazide (HYZAAR) 50-12.5 MG tablet TAKE 1 TABLET BY MOUTH EVERY DAY 90 tablet 1   methocarbamol (ROBAXIN) 500 MG tablet Take 1 tablet (500 mg total) by mouth every 8 (eight) hours as needed for muscle spasms. 40 tablet 1   Omega-3 Fatty Acids (FISH OIL MAXIMUM STRENGTH) 1200 MG CAPS Take 1,200 mg by mouth daily.     ondansetron (ZOFRAN) 4 MG tablet Take 1 tablet (4 mg total) by mouth every 8 (eight) hours as needed for nausea or vomiting. 90 tablet 2   polyethylene glycol (MIRALAX / GLYCOLAX) 17 g packet Take 17 g by mouth daily. 14 each 0   rosuvastatin (CRESTOR) 20 MG tablet TAKE 1 TABLET BY MOUTH EVERY DAY (Patient taking differently: Take 20 mg by mouth daily.) 90 tablet 1   senna (SENOKOT) 8.6 MG TABS tablet Take 1 tablet (8.6 mg total) by mouth 2 (two) times daily. 120 tablet 0   tiZANidine (ZANAFLEX) 4 MG tablet Take 1 tablet (4  mg total) by mouth every 8 (eight) hours as needed for muscle spasms. 40 tablet 0   TRESIBA FLEXTOUCH 100 UNIT/ML SOPN FlexTouch Pen Inject 80 Units into the skin daily. Pt takes in the am     No current facility-administered medications for this visit.    No Known Allergies  REVIEW OF SYSTEMS:   '[X]'$  denotes positive finding, '[ ]'$  denotes negative finding Cardiac  Comments:  Chest pain or chest pressure:    Shortness of breath upon exertion:    Short of breath when lying flat:    Irregular heart rhythm:        Vascular    Pain in calf, thigh, or hip brought on by ambulation:    Pain in feet at night that wakes you up from your sleep:     Blood clot in your veins:    Leg swelling:  x       Pulmonary    Oxygen at home:    Productive cough:     Wheezing:         Neurologic    Sudden weakness in arms or legs:     Sudden numbness in arms or legs:     Sudden onset of difficulty speaking or slurred speech:    Temporary loss of vision in one eye:     Problems with dizziness:         Gastrointestinal    Blood in stool:     Vomited blood:         Genitourinary    Burning when urinating:     Blood in urine:        Psychiatric    Major depression:         Hematologic    Bleeding problems:    Problems with blood clotting too easily:        Skin    Rashes or ulcers:  Constitutional    Fever or chills:      PHYSICAL EXAMINATION:  Today's Vitals   12/28/21 0959  BP: 127/76  Pulse: 63  Resp: 16  Temp: 97.6 F (36.4 C)  TempSrc: Temporal  SpO2: 98%  Weight: 200 lb (90.7 kg)  Height: '5\' 9"'$  (1.753 m)  PainSc: 7    Body mass index is 29.53 kg/m.   General:  WDWN in NAD; vital signs documented above Gait: Not observed HENT: WNL, normocephalic Pulmonary: normal non-labored breathing without wheezing Cardiac: regular HR; without carotid bruits Abdomen: soft, NT, aortic pulse is not palpable Skin: without rashes Vascular Exam/Pulses:  Right Left   Radial 2+ (normal) 2+ (normal)  DP 2+ (normal) 2+ (normal)   Extremities: LLE swelling   Neurologic: A&O X 3;  moving all extremities equally Psychiatric:  The pt has Normal affect.   Non-Invasive Vascular Imaging:   Venous duplex on 12/28/2021: +--------------+---------+------+-----------+------------+--------+  LEFT          Reflux NoRefluxReflux TimeDiameter cmsComments                          Yes                                   +--------------+---------+------+-----------+------------+--------+  CFV                     yes   >1 second                       +--------------+---------+------+-----------+------------+--------+  FV prox       no                                              +--------------+---------+------+-----------+------------+--------+  FV mid        no                                              +--------------+---------+------+-----------+------------+--------+  FV dist       no                                              +--------------+---------+------+-----------+------------+--------+  Popliteal     no                                              +--------------+---------+------+-----------+------------+--------+  GSV at SFJ              yes    >500 ms      0.5               +--------------+---------+------+-----------+------------+--------+  GSV prox thighno                            0.48              +--------------+---------+------+-----------+------------+--------+  GSV mid  thigh no                            0.44              +--------------+---------+------+-----------+------------+--------+  GSV dist thighno                            0.34              +--------------+---------+------+-----------+------------+--------+  GSV at knee   no                            0.45              +--------------+---------+------+-----------+------------+--------+  GSV prox  calf no                            0.32              +--------------+---------+------+-----------+------------+--------+  SSV Pop Fossa no                                              +--------------+---------+------+-----------+------------+--------+  SSV prox calf no                                              +--------------+---------+------+-----------+------------+--------+   Summary:  Left:  - No evidence of deep vein thrombosis seen in the left lower extremity, from the common femoral through the popliteal veins.  - No evidence of superficial venous thrombosis in the left lower extremity.     - No evidence of superficial venous reflux seen in the left greater saphenous vein.  - No evidence of superficial venous reflux seen in the left short saphenous vein.     - Venous reflux is noted in the left common femoral vein.  - Venous reflux is noted in the left sapheno-femoral junction.    [name] is a 66 y.o. male who presents with: LLE swelling/lymphedema    -pt has easily palpable DP pedal pulses bilaterally.  He does have significant swelling on the left leg with PMH significant for prostate surgery with lymph node dissection.  He wears his thigh high compression and uses his sequential compression device at night, which is very helpful with his swelling.   -pt does not have evidence of DVT.  Pt does have venous reflux in the deep CFV and in the GSV at the SFJ.  He does not have any venous reflux otherwise, and therefore he is not a candidate for laser ablation.   -discussed with pt about continuing to wear his thigh high 20-30 mmHg compression stockings and pt was measured for these today.    -discussed the importance of leg elevation and how to elevate properly - pt is advised to elevate their legs and a diagram is given to them to demonstrate for pt to lay flat on their back with knees elevated and slightly bent with their feet higher than their knees,  which puts their feet higher than their heart for 15 minutes per day.  If pt cannot lay flat, advised to lay  as flat as possible.  -pt is advised to continue as much walking as possible and avoid sitting or standing for long periods of time.  -discussed with pt that swimming is helpful for exercise.  He does have access to a pool at his home but is not closed for the winter.  -handout with recommendations given -pt will f/u as needed.  Discussed with him that given he had extensive lymph node dissection, this is most likely the cause of his swelling but if it worsens, we could see him back and possibly evaluate for May Thurner Syndrome.  He is in agreement with this plan.    Leontine Locket, [facility] [phone]  Clinic MD:  Trula Slade

## 2021-12-28 ENCOUNTER — Ambulatory Visit (INDEPENDENT_AMBULATORY_CARE_PROVIDER_SITE_OTHER): Payer: HMO | Admitting: Physician Assistant

## 2021-12-28 ENCOUNTER — Other Ambulatory Visit: Payer: Self-pay

## 2021-12-28 ENCOUNTER — Ambulatory Visit (HOSPITAL_COMMUNITY)
Admission: RE | Admit: 2021-12-28 | Discharge: 2021-12-28 | Disposition: A | Discharge status: Home / Self Care | Payer: HMO | Source: Ambulatory Visit | Attending: Surgery | Admitting: Surgery

## 2021-12-28 ENCOUNTER — Telehealth: Payer: Self-pay

## 2021-12-28 VITALS — BP 127/76 | HR 63 | Temp 97.6°F | Resp 16 | Ht 69.0 in | Wt 200.0 lb

## 2021-12-28 DIAGNOSIS — I890 Lymphedema, not elsewhere classified: Secondary | ICD-10-CM

## 2021-12-28 DIAGNOSIS — M7989 Other specified soft tissue disorders: Secondary | ICD-10-CM

## 2021-12-28 MED ORDER — DOXYCYCLINE HYCLATE 100 MG PO TABS: 100.0000 mg | ORAL_TABLET | Freq: Two times a day (BID) | ORAL | 3 refills | Status: DC

## 2021-12-28 NOTE — Telephone Encounter (Signed)
Patient called and would like a refill on his doxycycline

## 2021-12-28 NOTE — Telephone Encounter (Signed)
LMOM for patient letting him know we sent this in for him

## 2022-01-06 ENCOUNTER — Ambulatory Visit (INDEPENDENT_AMBULATORY_CARE_PROVIDER_SITE_OTHER): Payer: HMO | Admitting: Orthopaedic Surgery

## 2022-01-06 ENCOUNTER — Encounter: Payer: Self-pay | Admitting: Orthopaedic Surgery

## 2022-01-06 DIAGNOSIS — Z96652 Presence of left artificial knee joint: Secondary | ICD-10-CM | POA: Diagnosis not present

## 2022-01-06 DIAGNOSIS — T8454XD Infection and inflammatory reaction due to internal left knee prosthesis, subsequent encounter: Secondary | ICD-10-CM | POA: Diagnosis not present

## 2022-01-06 NOTE — Progress Notes (Signed)
[name] comes in today 5 months out from a revision arthroplasty of his left total knee that had become infected.  He had an excision arthroplasty and placement of antibiotic spacer.  The infection was a resistant infection.  He is feeling much better overall.  Has been using his lymphedema machine and the swelling is gone down.  We aspirated his knee several weeks ago and that showed no organisms and 5000 white cells.  On exam today there is no redness and no effusion of the right knee and he feels better overall.  You can tell he is wearing his compressive garments and using his lymphedema machine.  Since he is doing well we will continue to watch his knee closely.  The next time and exam is 3 months we will have an AP and lateral of his left knee at that visit.  Obviously if things worsen in terms of swelling or redness in that left knee with fever and chills we will need to see him sooner.  All questions and concerns were answered and addressed.

## 2022-03-24 ENCOUNTER — Other Ambulatory Visit: Payer: Self-pay | Admitting: Cardiology

## 2022-03-24 DIAGNOSIS — I10 Essential (primary) hypertension: Secondary | ICD-10-CM

## 2022-04-12 ENCOUNTER — Ambulatory Visit: Payer: HMO | Admitting: Orthopaedic Surgery

## 2022-04-27 ENCOUNTER — Other Ambulatory Visit: Payer: Self-pay | Admitting: Cardiology

## 2022-04-27 DIAGNOSIS — I10 Essential (primary) hypertension: Secondary | ICD-10-CM

## 2022-04-28 ENCOUNTER — Ambulatory Visit (INDEPENDENT_AMBULATORY_CARE_PROVIDER_SITE_OTHER): Payer: PPO

## 2022-04-28 ENCOUNTER — Ambulatory Visit (INDEPENDENT_AMBULATORY_CARE_PROVIDER_SITE_OTHER): Payer: PPO | Admitting: Orthopaedic Surgery

## 2022-04-28 ENCOUNTER — Encounter: Payer: Self-pay | Admitting: Orthopaedic Surgery

## 2022-04-28 DIAGNOSIS — Z96652 Presence of left artificial knee joint: Secondary | ICD-10-CM

## 2022-04-28 NOTE — Progress Notes (Signed)
The patient comes for continued follow-up as it relates to a revision arthroplasty of the previously infected left total knee.  He reports swelling and decreased flexibility but overall seems to be doing well.  He says his blood glucose is under better control.  On examination of his left knee his extension is full and his flexion is limited but getting past 90 degrees.  There is no redness or induration skin.  The knee is still warm to be expected but overall is ligamentously stable and shows no external evidence of infection.  2 views left knee show well-seated revision arthroplasty and compared to films from 6 months ago still looks stable.  He will continue to work on increased activities as comfort allows.  If he does develop any signs or evidence of continued infection he knows to let us know immediately.  I would like to see him back 1 more time in 6 months with a repeat standing AP and lateral of his left operative knee.

## 2022-05-11 ENCOUNTER — Other Ambulatory Visit: Payer: Self-pay | Admitting: Physician Assistant

## 2022-05-17 ENCOUNTER — Other Ambulatory Visit: Payer: Self-pay | Admitting: Orthopaedic Surgery

## 2022-05-17 ENCOUNTER — Telehealth: Payer: Self-pay | Admitting: Orthopaedic Surgery

## 2022-05-17 MED ORDER — AMOXICILLIN 500 MG PO TABS: 500.0000 mg | ORAL_TABLET | Freq: Two times a day (BID) | ORAL | 1 refills | Status: DC

## 2022-05-17 NOTE — Telephone Encounter (Signed)
Patient requesting antibiotics for dental cleaning on the 7th March. Patient asking if he should get his teeth cleaned or should he wait due to his PMHX ( MRSA) please advise

## 2022-05-17 NOTE — Telephone Encounter (Signed)
Patient aware of the below message from [place]  

## 2022-05-27 ENCOUNTER — Encounter: Payer: Self-pay | Admitting: Radiology

## 2022-06-04 ENCOUNTER — Other Ambulatory Visit: Payer: Self-pay | Admitting: Orthopaedic Surgery

## 2022-06-30 ENCOUNTER — Other Ambulatory Visit: Payer: Self-pay | Admitting: Orthopaedic Surgery

## 2022-07-15 ENCOUNTER — Other Ambulatory Visit: Payer: Self-pay | Admitting: Orthopaedic Surgery

## 2022-08-03 ENCOUNTER — Encounter: Payer: Self-pay | Admitting: *Deleted

## 2022-08-05 ENCOUNTER — Other Ambulatory Visit: Payer: Self-pay | Admitting: Cardiology

## 2022-08-05 DIAGNOSIS — I10 Essential (primary) hypertension: Secondary | ICD-10-CM

## 2022-08-12 ENCOUNTER — Other Ambulatory Visit: Payer: Self-pay | Admitting: Orthopaedic Surgery

## 2022-08-24 ENCOUNTER — Other Ambulatory Visit: Payer: Self-pay | Admitting: Orthopaedic Surgery

## 2022-10-01 ENCOUNTER — Telehealth: Payer: Self-pay | Admitting: Orthopaedic Surgery

## 2022-10-01 NOTE — Telephone Encounter (Signed)
Received call from Diane, pts wife. She is on DPR, she is needing dates of surgery from Dr. Magnus Ivan. I gave her dates , ph [phone]

## 2022-10-06 ENCOUNTER — Other Ambulatory Visit: Payer: Self-pay | Admitting: Orthopaedic Surgery

## 2022-10-27 ENCOUNTER — Ambulatory Visit: Payer: PPO | Admitting: Orthopaedic Surgery

## 2022-10-29 ENCOUNTER — Other Ambulatory Visit: Payer: Self-pay | Admitting: Orthopaedic Surgery

## 2022-11-01 ENCOUNTER — Ambulatory Visit: Payer: PPO | Admitting: Orthopaedic Surgery

## 2022-11-05 ENCOUNTER — Ambulatory Visit: Payer: PPO | Admitting: Orthopedic Surgery

## 2022-11-19 ENCOUNTER — Other Ambulatory Visit (HOSPITAL_COMMUNITY): Payer: Self-pay | Admitting: Urology

## 2022-11-19 DIAGNOSIS — C61 Malignant neoplasm of prostate: Secondary | ICD-10-CM

## 2022-11-26 ENCOUNTER — Other Ambulatory Visit (INDEPENDENT_AMBULATORY_CARE_PROVIDER_SITE_OTHER): Payer: PPO

## 2022-11-26 ENCOUNTER — Ambulatory Visit: Payer: PPO | Admitting: Orthopedic Surgery

## 2022-11-26 DIAGNOSIS — M65332 Trigger finger, left middle finger: Secondary | ICD-10-CM

## 2022-11-26 MED ORDER — BETAMETHASONE SOD PHOS & ACET 6 (3-3) MG/ML IJ SUSP
6.0000 mg | INTRAMUSCULAR | Status: AC | PRN
Start: 2022-11-26 — End: 2022-11-26
  Administered 2022-11-26: 6 mg via INTRA_ARTICULAR

## 2022-11-26 MED ORDER — LIDOCAINE HCL 1 % IJ SOLN
1.0000 mL | INTRAMUSCULAR | Status: AC | PRN
Start: 2022-11-26 — End: 2022-11-26
  Administered 2022-11-26: 1 mL

## 2022-11-26 NOTE — Progress Notes (Signed)
[name] - 67 y.o. male MRN [id number]  Date of birth: 18-Jan-1956  Office Visit Note: Visit Date: 11/26/2022 PCP: Chilton Greathouse, MD Referred by: Chilton Greathouse, MD  Subjective: No chief complaint on file.  HPI: [name] is a pleasant 67 y.o. male who presents today for evaluation of ongoing left long finger clicking and locking, particularly in the morning.  Occasionally requires manual correction, does notice clicking and locking on a regular basis.  Feels that this problem has worsened over the past multiple weeks.  Has not undergone prior treatments for this.  He is a diabetic, states that his glucose control has been better more recently.  Pertinent ROS were reviewed with the patient and found to be negative unless otherwise specified above in HPI.   Visit Reason: left hand/ middle finger Hand dominance: right Occupation: Pastor Diabetic: Yes/ 7.8(?) Heart/Lung History: none Blood Thinners: yes but unsure which one  Prior Testing/EMG: none Injections (Date): none Treatments:none Prior Surgery: none   *triggers mainly in morning*  Assessment & Plan: Visit Diagnoses:  1. Trigger finger, left middle finger     Plan: Extensive discussion was had the patient today regarding his left hand long finger trigger digit.  Given his ongoing symptoms with regular clicking and locking, patient is indicated for left long finger cortisone injection for symptom relief.  We discussed the efficacy of the injection as well as possibility for recurrence or persistence of symptoms that may require surgical intervention in the future.  Risk and benefit of the injection were discussed in detail as well.  Patient expressed full understanding.  Follow-up: No follow-ups on file.   Meds & Orders: No orders of the defined types were placed in this encounter.   Orders Placed This Encounter  Procedures   Hand/UE Inj: L long A1   XR Hand Complete Left      Procedures: Hand/UE Inj: L long A1 for trigger finger on 11/26/2022 9:43 AM Indications: pain Details: 25 G needle, volar approach Medications: 1 mL lidocaine 1 %; 6 mg betamethasone acetate-betamethasone sodium phosphate 6 (3-3) MG/ML Consent was given by the patient. Patient was prepped and draped in the usual sterile fashion.          Clinical History: No specialty comments available.  He reports that he has never smoked. He has never used smokeless tobacco. No results for input(s): "HGBA1C", "LABURIC" in the last 8760 hours.  Objective:   Vital Signs: There were no vitals taken for this visit.  Physical Exam  Gen: Well-appearing, in no acute distress; non-toxic CV: Regular Rate. Well-perfused. Warm.  Resp: Breathing unlabored on room air; no wheezing. Psych: Fluid speech in conversation; appropriate affect; normal thought process  Ortho Exam Left hand: - Tenderness over the long finger A1 pulley, palpable nodule, notable clicking on examination today - Able to form composite fist, long finger with appropriate range of motion at the MP, PIP and DIP joints well-maintained - Hand is warm well-perfused throughout  Imaging: XR Hand Complete Left  Result Date: 11/26/2022 X-rays of the left hand, multiple views were obtained today X-rays demonstrate calcifications notable at the radiocarpal interval, particularly at the distal aspect of the ulnar head.  There is joint space narrowing seen at the radiocarpal and distal radial ulnar joint as well.  Small joints are well-preserved throughout the hand, bone mineralization is appropriate.   Past Medical/Family/Surgical/Social History: Medications & Allergies reviewed per EMR, new medications updated. Patient Active Problem List  Diagnosis Date Noted   Status post revision of total replacement of left knee 08/27/2021   Status post revision of total knee replacement, left 08/14/2021   PICC (peripherally inserted central catheter) in  place 06/09/2021   Vaccine counseling 05/04/2021   Skin lesion 01/01/2021   Type 2 diabetes mellitus with other specified complication (HCC) 01/01/2021   Nausea and vomiting 09/11/2020   MSSA (methicillin susceptible Staphylococcus aureus) infection 09/03/2020   Effusion, left knee 08/14/2020   Infection of total left knee replacement (HCC) 08/14/2020   Status post total left knee replacement 03/21/2019   S/P carpal tunnel release right 02/22/19 03/08/2019   Carpal tunnel syndrome of right wrist    HTN (hypertension) 10/27/2018   Prostate cancer (HCC) 05/30/2017   Malignant neoplasm of prostate (HCC) 04/28/2017   Chronic pain of both knees 02/21/2017   Unilateral primary osteoarthritis, left knee 02/21/2017   Unilateral primary osteoarthritis, right knee 02/21/2017   Past Medical History:  Diagnosis Date   Arthritis    Diabetes mellitus without complication (HCC) 2019   Diabetes Type II   GERD (gastroesophageal reflux disease)    diet controlled   HTN (hypertension) 10/27/2018   Hyperlipidemia    Internal hemorrhoids 2014   noted on colonoscopy   MSSA (methicillin susceptible Staphylococcus aureus) infection 09/03/2020   Osteoarthritis of both knees    Prostate cancer (HCC)    prostate   Sigmoid diverticulosis 2014   Mild, noted on colonoscopy   Skin lesion 01/01/2021   Type 2 diabetes mellitus with other specified complication (HCC) 01/01/2021   Vaccine counseling 05/04/2021   Family History  Problem Relation Age of Onset   Cancer Mother        small cell lung   Past Surgical History:  Procedure Laterality Date   CARPAL TUNNEL RELEASE Right 02/22/2019   Procedure: CARPAL TUNNEL RELEASE;  Surgeon: Vickki Hearing, MD;  Location: [facility];  Service: Orthopedics;  Laterality: Right;   COLONOSCOPY N/A 09/13/2012   Procedure: COLONOSCOPY;  Surgeon: West Bali, MD;  Location: [facility];  Service: Endoscopy;  Laterality: N/A;  9:30 AM   CYSTOSCOPY WITH URETHRAL  DILATATION N/A 06/02/2020   Procedure: CYSTOSCOPY WITH URETHRAL DILATATION;  Surgeon: Heloise Purpura, MD;  Location: [facility];  Service: Urology;  Laterality: N/A;  ONLY NEEDS 30 MIN   EXCISIONAL TOTAL KNEE ARTHROPLASTY WITH ANTIBIOTIC SPACERS Left 05/15/2021   Procedure: LEFT TOTAL KNEE IRRIGATION AND DEBRIDEMENT, EXCISION LEFT TOTAL KNEE ARTHROPLASTY WITH ANTIBIOTIC SPACERS;  Surgeon: Kathryne Hitch, MD;  Location: [facility];  Service: Orthopedics;  Laterality: Left;   HERNIA REPAIR     20 years ago   I & D KNEE WITH POLY EXCHANGE Left 08/14/2020   Procedure: IRRIGATION AND DEBRIDEMENT LEFT KNEE with synovectomy WITH POLY EXCHANGE;  Surgeon: Kathryne Hitch, MD;  Location: [facility];  Service: Orthopedics;  Laterality: Left;   KNEE ARTHROSCOPY Left    15 years ago   LYMPHADENECTOMY Bilateral 05/30/2017   Procedure: LYMPHADENECTOMY, PELVIC EXTENDED;  Surgeon: Heloise Purpura, MD;  Location: [facility];  Service: Urology;  Laterality: Bilateral;   ROBOT ASSISTED LAPAROSCOPIC RADICAL PROSTATECTOMY N/A 05/30/2017   Procedure: XI ROBOTIC ASSISTED LAPAROSCOPIC RADICAL PROSTATECTOMY LEVEL 3;  Surgeon: Heloise Purpura, MD;  Location: [facility];  Service: Urology;  Laterality: N/A;   SHOULDER ARTHROSCOPY Right    15 years ago   TOTAL KNEE ARTHROPLASTY Left 03/21/2019   Procedure: LEFT TOTAL KNEE ARTHROPLASTY;  Surgeon: Kathryne Hitch, MD;  Location:  [facility];  Service: Orthopedics;  Laterality: Left;   TOTAL KNEE REVISION Left 08/14/2021   Procedure: REMOVAL OF ANTIBIOTIC SPACER AND LEFT TOTAL KNEE REVISION ARTHROPLASTY;  Surgeon: Kathryne Hitch, MD;  Location: [facility];  Service: Orthopedics;  Laterality: Left;   Social History   Occupational History   Occupation: pastor  Tobacco Use   Smoking status: Never   Smokeless tobacco: Never  Vaping Use   Vaping status: Never Used  Substance and Sexual Activity   Alcohol use: No   Drug use: No   Sexual activity: Yes    Victory Dresden Trevor Mace, M.D. [facility] 9:50 AM

## 2022-12-02 ENCOUNTER — Ambulatory Visit (HOSPITAL_COMMUNITY)
Admission: RE | Admit: 2022-12-02 | Discharge: 2022-12-02 | Disposition: A | Discharge status: Home / Self Care | Payer: PPO | Source: Ambulatory Visit | Attending: Urology | Admitting: Urology

## 2022-12-02 DIAGNOSIS — C61 Malignant neoplasm of prostate: Secondary | ICD-10-CM | POA: Insufficient documentation

## 2022-12-02 MED ORDER — FLOTUFOLASTAT F 18 GALLIUM 296-5846 MBQ/ML IV SOLN
7.4000 | Freq: Once | INTRAVENOUS | Status: AC
  Administered 2022-12-02: 7.4 via INTRAVENOUS

## 2022-12-21 ENCOUNTER — Encounter: Payer: Self-pay | Admitting: *Deleted

## 2023-01-03 ENCOUNTER — Ambulatory Visit: Payer: PPO | Admitting: Orthopedic Surgery

## 2023-01-13 ENCOUNTER — Other Ambulatory Visit (INDEPENDENT_AMBULATORY_CARE_PROVIDER_SITE_OTHER): Payer: PPO

## 2023-01-13 ENCOUNTER — Ambulatory Visit: Payer: PPO | Admitting: Orthopaedic Surgery

## 2023-01-13 DIAGNOSIS — Z96652 Presence of left artificial knee joint: Secondary | ICD-10-CM

## 2023-01-13 DIAGNOSIS — T8454XD Infection and inflammatory reaction due to internal left knee prosthesis, subsequent encounter: Secondary | ICD-10-CM

## 2023-01-13 MED ORDER — CELECOXIB 200 MG PO CAPS: 200.0000 mg | ORAL_CAPSULE | Freq: Two times a day (BID) | ORAL | 3 refills | Status: DC | PRN

## 2023-01-13 MED ORDER — HYDROCODONE-ACETAMINOPHEN 7.5-325 MG PO TABS: 1.0000 | ORAL_TABLET | Freq: Three times a day (TID) | ORAL | 0 refills | Status: DC | PRN

## 2023-01-13 NOTE — Progress Notes (Signed)
The patient is over a year and a half out from removal of antibiotic spacer and revision left total knee arthroplasty.  He is on chronic antibiotics.  He has been dealing with right knee pain and swelling this seems to be slowly getting worse.  He has also been dealing with a higher PSA level and a history of prostate cancer.  There is some treatment processes that he is having to go through for that.  He is a diabetic.  He says his blood glucose has been running high recently.  Examination of his left knee shows no redness but there is warmth and effusion.  I was able to aspirate at least 40 to 50 cc of dark fluid from his left knee that is concerning.  I am going to send that fluid off for Gram stain and culture as well as cell count.  I would like to see him back in just a week to go over those results.  I did obtain x-rays of the knee today and there is some slight lucency at the medial tibial plateau which certainly can be concerning for loosening and continued infection.  A long stems are in place and I see no other worrisome features the implant itself.  However again there is concern that there is return of infection.  I will send in some Celebrex and hydrocodone and see him back next week to go over those results.

## 2023-01-13 NOTE — Addendum Note (Signed)
Addended by: Hans Eden on: 01/13/2023 04:39 PM   Modules accepted: Orders

## 2023-01-14 LAB — SYNOVIAL FLUID ANALYSIS, COMPLETE
Basophils, %: 0 %
Eosinophils-Synovial: 0 % (ref 0–2)
Lymphocytes-Synovial Fld: 2 % (ref 0–74)
Monocyte/Macrophage: 2 % (ref 0–69)
Neutrophil, Synovial: 96 % — ABNORMAL HIGH (ref 0–24)
Synoviocytes, %: 0 % (ref 0–15)
WBC, Synovial: 2690 {cells}/uL — ABNORMAL HIGH (ref <150)

## 2023-01-14 LAB — GRAM STAIN
MICRO NUMBER:: 15639779
SPECIMEN QUALITY:: ADEQUATE

## 2023-01-16 LAB — BODY FLUID CULTURE

## 2023-01-19 ENCOUNTER — Ambulatory Visit: Payer: PPO | Admitting: Orthopaedic Surgery

## 2023-01-19 DIAGNOSIS — Z96652 Presence of left artificial knee joint: Secondary | ICD-10-CM | POA: Diagnosis not present

## 2023-01-19 DIAGNOSIS — T8454XD Infection and inflammatory reaction due to internal left knee prosthesis, subsequent encounter: Secondary | ICD-10-CM | POA: Diagnosis not present

## 2023-01-19 MED ORDER — DOXYCYCLINE HYCLATE 100 MG PO TABS: 100.0000 mg | ORAL_TABLET | Freq: Two times a day (BID) | ORAL | 0 refills | Status: DC

## 2023-01-19 NOTE — Progress Notes (Signed)
The patient comes in today a week after we aspirated fluid from his left knee.  That fluid did not grow out any organisms.  Fortunately he has been off antibiotics for many months now.  This is a knee that has a chronic infection in the past and has had revision surgery.  His white blood cell count out of that aspiration was 2,600.  He has been on some hydrocodone and Celebrex which has helped him quite a bit.  He reports that he is much more comfortable.  He denies any fever and chills.  There is still no redness or induration around his left knee.  His range of motion is quite limited by previous scar tissue.  Overall though he does look better.  We are considering potentially an open arthrotomy with lysis of adhesions and a poly exchange after the first of the year.  I am encouraged by the fact that the aspiration showed no organisms and did not grow out anything.  He is also been off antibiotics.  He will continue the anti-inflammatories and pain medicine and increasing activities as comfort allows.  I would also put him on doxycycline for the next month.  Will see him back in 4 weeks and at that point potentially consider setting him up for surgery after the first year for an open lysis of adhesions and poly exchange.

## 2023-01-26 ENCOUNTER — Ambulatory Visit: Payer: PPO | Admitting: Orthopaedic Surgery

## 2023-02-16 ENCOUNTER — Encounter: Payer: Self-pay | Admitting: Orthopaedic Surgery

## 2023-02-16 ENCOUNTER — Ambulatory Visit: Payer: PPO | Admitting: Orthopaedic Surgery

## 2023-02-16 DIAGNOSIS — Z96652 Presence of left artificial knee joint: Secondary | ICD-10-CM | POA: Diagnosis not present

## 2023-02-16 MED ORDER — DOXYCYCLINE HYCLATE 100 MG PO TABS: 100.0000 mg | ORAL_TABLET | Freq: Two times a day (BID) | ORAL | 0 refills | Status: DC

## 2023-02-16 MED ORDER — HYDROCODONE-ACETAMINOPHEN 7.5-325 MG PO TABS: 1.0000 | ORAL_TABLET | Freq: Four times a day (QID) | ORAL | 0 refills | Status: DC | PRN

## 2023-02-16 MED ORDER — CELECOXIB 200 MG PO CAPS: 200.0000 mg | ORAL_CAPSULE | Freq: Two times a day (BID) | ORAL | 3 refills | Status: DC | PRN

## 2023-02-16 NOTE — Progress Notes (Signed)
HPI: [name] comes in today for follow-up of his left knee.  He states that overall he is doing pretty good on Celebrex hydrocodone.  He is also back on doxycycline.  History of left knee revision 08/14/2021.  He states he has minimal pain in the morning some increased pain at the end of the day.  Denies any fevers chills.  Denies any swelling in the knee.  Does note that he has developed a slight rash around the knee.   Review of systems: See HPI otherwise negative  Physical exam: General Well-developed well-nourished male in no acute distress.  There is no abnormal warmth erythema or effusion of the knee.  Some areas of slight folliculitis about the knee.  He has full extension flexion to beyond 90 degrees.  No gross instability.  Impression: History of left knee revision  Plan: This point in time we will continue with his doxycycline.  He will also continue his Celebrex and hydrocodone.  All of these prescriptions were called in today.  See him back in 1 month see how he is doing overall.  At this point time he would like to avoid surgery if possible.  Questions were encouraged and answered by Dr. Magnus Beard and myself.

## 2023-03-05 ENCOUNTER — Other Ambulatory Visit: Payer: Self-pay | Admitting: Orthopaedic Surgery

## 2023-03-08 ENCOUNTER — Telehealth: Payer: Self-pay | Admitting: Orthopaedic Surgery

## 2023-03-08 NOTE — Telephone Encounter (Signed)
Pt's wife Sedalia Muta called stating her husband knee is really swollen and possible infection in his knee. Asking for an appt. Please call pt at [phone].

## 2023-03-09 ENCOUNTER — Ambulatory Visit: Payer: PPO | Admitting: Orthopaedic Surgery

## 2023-03-09 ENCOUNTER — Encounter: Payer: Self-pay | Admitting: Orthopaedic Surgery

## 2023-03-09 DIAGNOSIS — T8454XD Infection and inflammatory reaction due to internal left knee prosthesis, subsequent encounter: Secondary | ICD-10-CM

## 2023-03-09 MED ORDER — HYDROCODONE-ACETAMINOPHEN 7.5-325 MG PO TABS: 1.0000 | ORAL_TABLET | Freq: Two times a day (BID) | ORAL | 0 refills | Status: DC | PRN

## 2023-03-09 MED ORDER — CELECOXIB 200 MG PO CAPS: 200.0000 mg | ORAL_CAPSULE | Freq: Two times a day (BID) | ORAL | 3 refills | Status: DC | PRN

## 2023-03-09 NOTE — Progress Notes (Signed)
The patient comes in today for continued evaluation of his left knee revision arthroplasty.  His original total knee replacement was performed in December 2020.  That had done well until he did develop a significant infection in that left knee.  He has struggled with his diabetic control.  At the time of his original surgery he had good control of his blood glucose but then they got out of significant control.  He then developed an infection in the knee in May 2022 performed an irrigation and debridement with poly liner exchange of that left knee.  In February 2023 we removed all components and placed a temporary antibiotic spacer.  In May 2023 remove the antibiotic spacer having hopefully eradicate the infection.  We placed a revision arthroplasty.  He has done well for considerable amount of time except for lymphedema.  He has had episodes where the knee does swell.  I have aspirated fluid off of his knee in August of this year and it showed a white blood cell count of 3600.  We aspirated fluid off of his knee again in October and it showed 2600 white cells.  He had been off of antibiotics and the Gram stain was negative for organisms.  He comes in today because the knee is swollen again.  Examination of his left knee does show significant effusion.  He does let me know that his blood glucose has been running high again and is usually around 200 on a daily basis.  I did aspirate a large amount of fluid from his left knee.  We did not send this off since he has been on doxycycline since we saw him last.  Also on exam, there is some redness to the anterior aspect of his knee that is concerning for continued infection.  At this standpoint I have recommended him being off antibiotics for the next 2 weeks and then we take him to the operating room for an open arthrotomy with obtaining soft tissue cultures and a poly liner exchange so we can further assess his knee at that time to determine if there is a situation  of dealing with a chronic infection.  We would then go further from there in terms of likely 6 weeks of IV antibiotics in order to hopefully retain the knee if chronic suppression is warranted.  He agrees with this treatment plan.  I will send in some hydrocodone and Celebrex and we will schedule him for surgery the week after next.  Again he is to stay off of all antibiotics at this point.  Of note also he has not running any fevers and has had no chills and does not feel sick.

## 2023-03-14 NOTE — Progress Notes (Signed)
Sent message, via epic in basket, requesting orders in epic from surgeon.  

## 2023-03-17 NOTE — Patient Instructions (Addendum)
SURGICAL WAITING ROOM VISITATION  Patients having surgery or a procedure may have no more than 2 support people in the waiting area - these visitors may rotate.    Children under the age of 71 must have an adult with them who is not the patient.  Due to an increase in RSV and influenza rates and associated hospitalizations, children ages 71 and under may not visit patients in [facility] hospitals.  If the patient needs to stay at the hospital during part of their recovery, the visitor guidelines for inpatient rooms apply. Pre-op nurse will coordinate an appropriate time for 1 support person to accompany patient in pre-op.  This support person may not rotate.    Please refer to the [facility] website for the visitor guidelines for Inpatients (after your surgery is over and you are in a regular room).    Your procedure is scheduled on: 03/25/23   Report to [facility] Main Entrance    Report to admitting at 1:00 PM   Call this number if you have problems the morning of surgery [phone]   Do not eat food :After Midnight.   After Midnight you may have the following liquids until 12:30 PM DAY OF SURGERY  Water Non-Citrus Juices (without pulp, NO RED-Apple, White grape, White cranberry) Black Coffee (NO MILK/CREAM OR CREAMERS, sugar ok)  Clear Tea (NO MILK/CREAM OR CREAMERS, sugar ok) regular and decaf                             Plain Jell-O (NO RED)                                           Fruit ices (not with fruit pulp, NO RED)                                     Popsicles (NO RED)                                                               Sports drinks like Gatorade (NO RED)                     If you have questions, please contact your surgeon's office.   FOLLOW BOWEL PREP AND ANY ADDITIONAL PRE OP INSTRUCTIONS YOU RECEIVED FROM YOUR SURGEON'S OFFICE!!!     Oral Hygiene is also important to reduce your risk of infection.                                     Remember - BRUSH YOUR TEETH THE MORNING OF SURGERY WITH YOUR REGULAR TOOTHPASTE  DENTURES WILL BE REMOVED PRIOR TO SURGERY PLEASE DO NOT APPLY "Poly grip" OR ADHESIVES!!!   Stop all vitamins and herbal supplements 7 days before surgery.   Take these medicines the morning of surgery with A SIP OF WATER: Lexapro, Norco, Rosuvastatin   DO NOT TAKE ANY ORAL DIABETIC MEDICATIONS DAY OF YOUR SURGERY  How to Manage Your Diabetes Before and After Surgery  Why is it important to control my blood sugar before and after surgery? Improving blood sugar levels before and after surgery helps healing and can limit problems. A way of improving blood sugar control is eating a healthy diet by:  Eating less sugar and carbohydrates  Increasing activity/exercise  Talking with your doctor about reaching your blood sugar goals High blood sugars (greater than 180 mg/dL) can raise your risk of infections and slow your recovery, so you will need to focus on controlling your diabetes during the weeks before surgery. Make sure that the doctor who takes care of your diabetes knows about your planned surgery including the date and location.  How do I manage my blood sugar before surgery? Check your blood sugar at least 4 times a day, starting 2 days before surgery, to make sure that the level is not too high or low. Check your blood sugar the morning of your surgery when you wake up and every 2 hours until you get to the Short Stay unit. If your blood sugar is less than 70 mg/dL, you will need to treat for low blood sugar: Do not take insulin. Treat a low blood sugar (less than 70 mg/dL) with  cup of clear juice (cranberry or apple), 4 glucose tablets, OR glucose gel. Recheck blood sugar in 15 minutes after treatment (to make sure it is greater than 70 mg/dL). If your blood sugar is not greater than 70 mg/dL on recheck, call [phone] for further instructions. Report your blood sugar to the short stay nurse when  you get to Short Stay.  If you are admitted to the hospital after surgery: Your blood sugar will be checked by the staff and you will probably be given insulin after surgery (instead of oral diabetes medicines) to make sure you have good blood sugar levels. The goal for blood sugar control after surgery is 80-180 mg/dL.   WHAT DO I DO ABOUT MY DIABETES MEDICATION?  Do not take oral diabetes medicines (pills) the morning of surgery.  THE DAY BEFORE SURGERY, take Take insulin as prescribed.     THE MORNING OF SURGERY, take 50% of [place]. Do not take novolog unless blood sugar is greater than 220, then take 50% of dose.  If your CBG is greater than 220 mg/dL, you may take  of your sliding scale  (correction) dose of insulin.  Reviewed and Endorsed by [facility] Patient Education Committee, August 2015                          You may not have any metal on your body including jewelry, and body piercing             Do not wear lotions, powders, cologne, or deodorant              Men may shave face and neck.   Do not bring valuables to the hospital. [facility] IS NOT             RESPONSIBLE   FOR VALUABLES.   Contacts, glasses, dentures or bridgework may not be worn into surgery.   Bring small overnight bag day of surgery.   DO NOT BRING YOUR HOME MEDICATIONS TO THE HOSPITAL. PHARMACY WILL DISPENSE MEDICATIONS LISTED ON YOUR MEDICATION LIST TO YOU DURING YOUR ADMISSION IN THE HOSPITAL!              Please  read over the following fact sheets you were given: IF YOU HAVE QUESTIONS ABOUT YOUR PRE-OP INSTRUCTIONS PLEASE CALL [phone] [place]    If you received a COVID test during your pre-op visit  it is requested that you wear a mask when out in public, stay away from anyone that may not be feeling well and notify your surgeon if you develop symptoms. If you test positive for Covid or have been in contact with anyone that has tested positive in the last 10 days please notify you  surgeon.      Pre-operative 5 CHG Bath Instructions   You can play a key role in reducing the risk of infection after surgery. Your skin needs to be as free of germs as possible. You can reduce the number of germs on your skin by washing with CHG (chlorhexidine gluconate) soap before surgery. CHG is an antiseptic soap that kills germs and continues to kill germs even after washing.   DO NOT use if you have an allergy to chlorhexidine/CHG or antibacterial soaps. If your skin becomes reddened or irritated, stop using the CHG and notify one of our RNs at [phone].   Please shower with the CHG soap starting 4 days before surgery using the following schedule:     Please keep in mind the following:  DO NOT shave, including legs and underarms, starting the day of your first shower.   You may shave your face at any point before/day of surgery.  Place clean sheets on your bed the day you start using CHG soap. Use a clean washcloth (not used since being washed) for each shower. DO NOT sleep with pets once you start using the CHG.   CHG Shower Instructions:  If you choose to wash your hair and private area, wash first with your normal shampoo/soap.  After you use shampoo/soap, rinse your hair and body thoroughly to remove shampoo/soap residue.  Turn the water OFF and apply about 3 tablespoons (45 ml) of CHG soap to a CLEAN washcloth.  Apply CHG soap ONLY FROM YOUR NECK DOWN TO YOUR TOES (washing for 3-5 minutes)  DO NOT use CHG soap on face, private areas, open wounds, or sores.  Pay special attention to the area where your surgery is being performed.  If you are having back surgery, having someone wash your back for you may be helpful. Wait 2 minutes after CHG soap is applied, then you may rinse off the CHG soap.  Pat dry with a clean towel  Put on clean clothes/pajamas   If you choose to wear lotion, please use ONLY the CHG-compatible lotions on the back of this paper.     Additional  instructions for the day of surgery: DO NOT APPLY any lotions, deodorants, cologne, or perfumes.   Put on clean/comfortable clothes.  Brush your teeth.  Ask your nurse before applying any prescription medications to the skin.      CHG Compatible Lotions   Aveeno Moisturizing lotion  Cetaphil Moisturizing Cream  Cetaphil Moisturizing Lotion  Clairol Herbal Essence Moisturizing Lotion, Dry Skin  Clairol Herbal Essence Moisturizing Lotion, Extra Dry Skin  Clairol Herbal Essence Moisturizing Lotion, Normal Skin  Curel Age Defying Therapeutic Moisturizing Lotion with Alpha Hydroxy  Curel Extreme Care Body Lotion  Curel Soothing Hands Moisturizing Hand Lotion  Curel Therapeutic Moisturizing Cream, Fragrance-Free  Curel Therapeutic Moisturizing Lotion, Fragrance-Free  Curel Therapeutic Moisturizing Lotion, Original Formula  Eucerin Daily Replenishing Lotion  Eucerin Dry Skin Therapy Plus Alpha Hydroxy Crme  Eucerin Dry Skin Therapy Plus Alpha Hydroxy Lotion  Eucerin Original Crme  Eucerin Original Lotion  Eucerin Plus Crme Eucerin Plus Lotion  Eucerin TriLipid Replenishing Lotion  Keri Anti-Bacterial Hand Lotion  Keri Deep Conditioning Original Lotion Dry Skin Formula Softly Scented  Keri Deep Conditioning Original Lotion, Fragrance Free Sensitive Skin Formula  Keri Lotion Fast Absorbing Fragrance Free Sensitive Skin Formula  Keri Lotion Fast Absorbing Softly Scented Dry Skin Formula  Keri Original Lotion  Keri Skin Renewal Lotion Keri Silky Smooth Lotion  Keri Silky Smooth Sensitive Skin Lotion  Nivea Body Creamy Conditioning Oil  Nivea Body Extra Enriched Lotion  Nivea Body Original Lotion  Nivea Body Sheer Moisturizing Lotion Nivea Crme  Nivea Skin Firming Lotion  NutraDerm 30 Skin Lotion  NutraDerm Skin Lotion  NutraDerm Therapeutic Skin Cream  NutraDerm Therapeutic Skin Lotion  ProShield Protective Hand Cream  Provon moisturizing lotion   Incentive  Spirometer  An incentive spirometer is a tool that can help keep your lungs clear and active. This tool measures how well you are filling your lungs with each breath. Taking long deep breaths may help reverse or decrease the chance of developing breathing (pulmonary) problems (especially infection) following: A long period of time when you are unable to move or be active. BEFORE THE PROCEDURE  If the spirometer includes an indicator to show your best effort, your nurse or respiratory therapist will set it to a desired goal. If possible, sit up straight or lean slightly forward. Try not to slouch. Hold the incentive spirometer in an upright position. INSTRUCTIONS FOR USE  Sit on the edge of your bed if possible, or sit up as far as you can in bed or on a chair. Hold the incentive spirometer in an upright position. Breathe out normally. Place the mouthpiece in your mouth and seal your lips tightly around it. Breathe in slowly and as deeply as possible, raising the piston or the ball toward the top of the column. Hold your breath for 3-5 seconds or for as long as possible. Allow the piston or ball to fall to the bottom of the column. Remove the mouthpiece from your mouth and breathe out normally. Rest for a few seconds and repeat Steps 1 through 7 at least 10 times every 1-2 hours when you are awake. Take your time and take a few normal breaths between deep breaths. The spirometer may include an indicator to show your best effort. Use the indicator as a goal to work toward during each repetition. After each set of 10 deep breaths, practice coughing to be sure your lungs are clear. If you have an incision (the cut made at the time of surgery), support your incision when coughing by placing a pillow or rolled up towels firmly against it. Once you are able to get out of bed, walk around indoors and cough well. You may stop using the incentive spirometer when instructed by your caregiver.  RISKS AND  COMPLICATIONS Take your time so you do not get dizzy or light-headed. If you are in pain, you may need to take or ask for pain medication before doing incentive spirometry. It is harder to take a deep breath if you are having pain. AFTER USE Rest and breathe slowly and easily. It can be helpful to keep track of a log of your progress. Your caregiver can provide you with a simple table to help with this. If you are using the spirometer at home, follow these instructions: SEEK MEDICAL CARE IF:  You are having difficultly using the spirometer. You have trouble using the spirometer as often as instructed. Your pain medication is not giving enough relief while using the spirometer. You develop fever of 100.5 F (38.1 C) or higher. SEEK IMMEDIATE MEDICAL CARE IF:  You cough up bloody sputum that had not been present before. You develop fever of 102 F (38.9 C) or greater. You develop worsening pain at or near the incision site. MAKE SURE YOU:  Understand these instructions. Will watch your condition. Will get help right away if you are not doing well or get worse. Document Released: 07/19/2006 Document Revised: 05/31/2011 Document Reviewed: 09/19/2006 Exit[employer] Patient Information 2014 [employer], [place].   ________________________________________________________________________ WHAT IS A BLOOD TRANSFUSION? Blood Transfusion Information  A transfusion is the replacement of blood or some of its parts. Blood is made up of multiple cells which provide different functions. Red blood cells carry oxygen and are used for blood loss replacement. White blood cells fight against infection. Platelets control bleeding. Plasma helps clot blood. Other blood products are available for specialized needs, such as hemophilia or other clotting disorders. BEFORE THE TRANSFUSION  Who gives blood for transfusions?  Healthy volunteers who are fully evaluated to make sure their blood is safe. This is blood bank  blood. Transfusion therapy is the safest it has ever been in the practice of medicine. Before blood is taken from a donor, a complete history is taken to make sure that person has no history of diseases nor engages in risky social behavior (examples are intravenous drug use or sexual activity with multiple partners). The donor's travel history is screened to minimize risk of transmitting infections, such as malaria. The donated blood is tested for signs of infectious diseases, such as HIV and hepatitis. The blood is then tested to be sure it is compatible with you in order to minimize the chance of a transfusion reaction. If you or a relative donates blood, this is often done in anticipation of surgery and is not appropriate for emergency situations. It takes many days to process the donated blood. RISKS AND COMPLICATIONS Although transfusion therapy is very safe and saves many lives, the main dangers of transfusion include:  Getting an infectious disease. Developing a transfusion reaction. This is an allergic reaction to something in the blood you were given. Every precaution is taken to prevent this. The decision to have a blood transfusion has been considered carefully by your caregiver before blood is given. Blood is not given unless the benefits outweigh the risks. AFTER THE TRANSFUSION Right after receiving a blood transfusion, you will usually feel much better and more energetic. This is especially true if your red blood cells have gotten low (anemic). The transfusion raises the level of the red blood cells which carry oxygen, and this usually causes an energy increase. The nurse administering the transfusion will monitor you carefully for complications. HOME CARE INSTRUCTIONS  No special instructions are needed after a transfusion. You may find your energy is better. Speak with your caregiver about any limitations on activity for underlying diseases you may have. SEEK MEDICAL CARE IF:  Your  condition is not improving after your transfusion. You develop redness or irritation at the intravenous (IV) site. SEEK IMMEDIATE MEDICAL CARE IF:  Any of the following symptoms occur over the next 12 hours: Shaking chills. You have a temperature by mouth above 102 F (38.9 C), not controlled by medicine. Chest, back, or muscle pain. People around you feel you are not acting  correctly or are confused. Shortness of breath or difficulty breathing. Dizziness and fainting. You get a rash or develop hives. You have a decrease in urine output. Your urine turns a dark color or changes to pink, red, or brown. Any of the following symptoms occur over the next 10 days: You have a temperature by mouth above 102 F (38.9 C), not controlled by medicine. Shortness of breath. Weakness after normal activity. The white part of the eye turns yellow (jaundice). You have a decrease in the amount of urine or are urinating less often. Your urine turns a dark color or changes to pink, red, or brown. Document Released: 03/05/2000 Document Revised: 05/31/2011 Document Reviewed: 10/23/2007 [facility] Patient Information 2014 [place], [place].  _______________________________________________________________________

## 2023-03-17 NOTE — Progress Notes (Addendum)
COVID Vaccine Completed: yes  Date of COVID positive in last 90 days: no  PCP - [name] Avva, MD Cardiologist - saw in 2021 for SOB and hypertriglyceridemia  Chest x-ray - n/a EKG - 03/18/23 Epic/chart Stress Test - 12/04/18 Epic ECHO - 12/07/18 Epic Cardiac Cath - n/a Pacemaker/ICD device last checked: n/a Spinal Cord Stimulator: n/a  Bowel Prep - no  Sleep Study - n/a CPAP -   Fasting Blood Sugar - 90-120 Checks Blood Sugar  CGM to LUE  Last dose of GLP1 agonist-  N/A GLP1 instructions:  Hold 7 days before surgery    Last dose of SGLT-2 inhibitors-  N/A SGLT-2 instructions:  Hold 3 days before surgery    Blood Thinner Instructions:  n/a Aspirin Instructions:  Last Dose:  Activity level: Can go up a flight of stairs and perform activities of daily living without stopping and without symptoms of chest pain or shortness of breath. Difficulty with stairs due to knee   Anesthesia review: A1C 8.2, DM2, HTN  Patient denies shortness of breath, fever, cough and chest pain at PAT appointment  Patient verbalized understanding of instructions that were given to them at the PAT appointment. Patient was also instructed that they will need to review over the PAT instructions again at home before surgery.

## 2023-03-18 ENCOUNTER — Encounter (HOSPITAL_COMMUNITY): Payer: Self-pay

## 2023-03-18 ENCOUNTER — Other Ambulatory Visit: Payer: Self-pay

## 2023-03-18 ENCOUNTER — Encounter (HOSPITAL_COMMUNITY)
Admission: RE | Admit: 2023-03-18 | Discharge: 2023-03-18 | Disposition: A | Discharge status: Home / Self Care | Payer: PPO | Source: Ambulatory Visit | Attending: Orthopaedic Surgery

## 2023-03-18 VITALS — BP 156/76 | HR 65 | Temp 98.0°F | Resp 16 | Ht 69.0 in | Wt 205.0 lb

## 2023-03-18 DIAGNOSIS — E1169 Type 2 diabetes mellitus with other specified complication: Secondary | ICD-10-CM | POA: Insufficient documentation

## 2023-03-18 DIAGNOSIS — Z96652 Presence of left artificial knee joint: Secondary | ICD-10-CM | POA: Diagnosis not present

## 2023-03-18 DIAGNOSIS — I10 Essential (primary) hypertension: Secondary | ICD-10-CM | POA: Insufficient documentation

## 2023-03-18 DIAGNOSIS — M009 Pyogenic arthritis, unspecified: Secondary | ICD-10-CM | POA: Insufficient documentation

## 2023-03-18 DIAGNOSIS — Z01818 Encounter for other preprocedural examination: Secondary | ICD-10-CM | POA: Diagnosis present

## 2023-03-18 LAB — BASIC METABOLIC PANEL
Anion gap: 9 (ref 5–15)
BUN: 21 mg/dL (ref 8–23)
CO2: 24 mmol/L (ref 22–32)
Calcium: 9.2 mg/dL (ref 8.9–10.3)
Chloride: 106 mmol/L (ref 98–111)
Creatinine, Ser: 0.82 mg/dL (ref 0.61–1.24)
GFR, Estimated: >60 mL/min (ref >60)
Glucose, Bld: 91 mg/dL (ref 70–99)
Potassium: 4.7 mmol/L (ref 3.5–5.1)
Sodium: 139 mmol/L (ref 135–145)

## 2023-03-18 LAB — CBC
HCT: 39.3 % (ref 39.0–52.0)
Hemoglobin: 12.9 g/dL — ABNORMAL LOW (ref 13.0–17.0)
MCH: 28.6 pg (ref 26.0–34.0)
MCHC: 32.8 g/dL (ref 30.0–36.0)
MCV: 87.1 fL (ref 80.0–100.0)
Platelets: 215 10*3/uL (ref 150–400)
RBC: 4.51 MIL/uL (ref 4.22–5.81)
RDW: 14.5 % (ref 11.5–15.5)
WBC: 4.9 10*3/uL (ref 4.0–10.5)
nRBC: 0 % (ref 0.0–0.2)

## 2023-03-18 LAB — SURGICAL PCR SCREEN
MRSA, PCR: POSITIVE — AB
Staphylococcus aureus: POSITIVE — AB

## 2023-03-18 LAB — GLUCOSE, CAPILLARY: Glucose-Capillary: 102 mg/dL — ABNORMAL HIGH (ref 70–99)

## 2023-03-18 NOTE — Progress Notes (Signed)
STAPH+ and MRSA+ results routed to Dr. Blackman 

## 2023-03-19 LAB — HEMOGLOBIN A1C
Hgb A1c MFr Bld: 8.2 % — ABNORMAL HIGH (ref 4.8–5.6)
Mean Plasma Glucose: 189 mg/dL

## 2023-03-21 ENCOUNTER — Encounter (HOSPITAL_COMMUNITY): Payer: Self-pay

## 2023-03-21 NOTE — Anesthesia Preprocedure Evaluation (Signed)
Anesthesia Evaluation    Airway        Dental   Pulmonary           Cardiovascular hypertension,      Neuro/Psych    GI/Hepatic   Endo/Other  diabetes    Renal/GU      Musculoskeletal   Abdominal   Peds  Hematology   Anesthesia Other Findings   Reproductive/Obstetrics                              Anesthesia Physical Anesthesia Plan  ASA:   Anesthesia Plan:    Post-op Pain Management:    Induction:   PONV Risk Score and Plan:   Airway Management Planned:   Additional Equipment:   Intra-op Plan:   Post-operative Plan:   Informed Consent:   Plan Discussed with:   Anesthesia Plan Comments: (See PAT note from 12/27 by Sherlie Ban PA-C )         Anesthesia Quick Evaluation

## 2023-03-21 NOTE — Progress Notes (Signed)
Case: [id number] Date/Time: 03/25/23 1500   Procedure: INCISION AND DRAINAGE LEFT KNEE, SYNOVECTOMY,  POLY-LINER EXCHANGE (Left: Knee)   Anesthesia type: Choice   Pre-op diagnosis: chronic left knee infection   [facility]: [facility] 09 / [facility]   Surgeons: Kathryne Hitch, MD       DISCUSSION: [name] is a 67 yo male who presents to PAT prior to surgery above. PMH of HTN, GERD, IDDM, hx of prostate cancer s/p prostatectomy (2019), arthritis s/p L TKA on 02/22/2019 c/b joint infection in 2022 s/p I&D on 08/14/2020 and 05/15/2021 followed by L TKA revision on 08/14/2021.   Patient has known uncontrolled diabetes. A1c is 8.2 on PAT labs. He follows with PCP and clinical pharmacist at [facility] for his diabetes management. Last seen on 12/22/22. A1c was 7.0 at that time.  Previously saw Cardiology for DOE evaluation. Had a normal stress test and echo in 2020. Has since been lost to follow up but regularly follows with PCP. Denies CP/SOB at PAT visit.  VS: BP (!) 156/76   Pulse 65   Temp 36.7 C (Oral)   Resp 16   Ht 5\' 9"  (1.753 m)   Wt 93 kg   SpO2 99%   BMI 30.27 kg/m   PROVIDERS: Avva, [name], MD   LABS: Labs reviewed: Acceptable for surgery. (all labs ordered are listed, but only abnormal results are displayed)  Labs Reviewed  SURGICAL PCR SCREEN - Abnormal; Notable for the following components:      Result Value   MRSA, PCR POSITIVE (*)    Staphylococcus aureus POSITIVE (*)    All other components within normal limits  HEMOGLOBIN A1C - Abnormal; Notable for the following components:   Hgb A1c MFr Bld 8.2 (*)    All other components within normal limits  CBC - Abnormal; Notable for the following components:   Hemoglobin 12.9 (*)    All other components within normal limits  GLUCOSE, CAPILLARY - Abnormal; Notable for the following components:   Glucose-Capillary 102 (*)    All other components within normal limits  BASIC METABOLIC PANEL  TYPE AND SCREEN      IMAGES:  PET scan 12/02/22  IMPRESSION: 1. Small sometimes wispy intensely radiotracer avid metastatic lymph nodes along the common iliac vessels, periaortic retroperitoneum, mediastinum, thoracic inlet and RIGHT supraclavicular nodal stations. 2. No evidence of local recurrence in the prostate fossa. 3. No evidence of visceral metastasis or skeletal metastasis. 4.  Aortic Atherosclerosis (ICD10-I70.0).  EKG 03/18/23  Normal sinus rhythm, rate 66 Left axis deviation When compared with ECG of 19-May-2020 10:47, No significant change since last tracing  CV:  Echocardiogram 12/07/2018: Left ventricle cavity is normal in size. Normal left ventricular wall thickness. Normal LV systolic function with EF 55%. Normal global wall motion. Normal diastolic filling pattern. Calculated EF 55%. Mild (Grade I) mitral regurgitation. No evidence of pulmonary hypertension.   Exercise myoview stress test  12/04/2018: 1. Patient exercised on the Bruce protocol. They exercised for a total of 8:34 min., achieving approximately 7 METs. Resting EKG demonstrates normal sinus rhythm, stress EKG is negative for myocardial ischemia. Normal blood pressure response.  Stress terminated due to fatigue and achieving THR. 2. Perfusion images reveal mild diaphragmatic attenuation artifact in the inferior wall without evidence of ischemia or scar.  LVEF Later at 45% although visually appears to be normal.   3. This is a low risk stress test. Clinical correlation recommended. No previous exam available for comparison. Past Medical  History:  Diagnosis Date   Arthritis    Diabetes mellitus without complication (HCC) 2019   Diabetes Type II   GERD (gastroesophageal reflux disease)    diet controlled   HTN (hypertension) 10/27/2018   Hyperlipidemia    Internal hemorrhoids 2014   noted on colonoscopy   MSSA (methicillin susceptible Staphylococcus aureus) infection 09/03/2020   Osteoarthritis of both knees     Prostate cancer (HCC)    prostate   Sigmoid diverticulosis 2014   Mild, noted on colonoscopy   Skin lesion 01/01/2021   Type 2 diabetes mellitus with other specified complication (HCC) 01/01/2021   Vaccine counseling 05/04/2021    Past Surgical History:  Procedure Laterality Date   CARPAL TUNNEL RELEASE Right 02/22/2019   Procedure: CARPAL TUNNEL RELEASE;  Surgeon: Vickki Hearing, MD;  [facility];  Service: Orthopedics;  Laterality: Right;   COLONOSCOPY N/A 09/13/2012   Procedure: COLONOSCOPY;  Surgeon: West Bali, MD;  [facility];  Service: Endoscopy;  Laterality: N/A;  9:30 AM   CYSTOSCOPY WITH URETHRAL DILATATION N/A 06/02/2020   Procedure: CYSTOSCOPY WITH URETHRAL DILATATION;  Surgeon: Heloise Purpura, MD;  [facility];  Service: Urology;  Laterality: N/A;  ONLY NEEDS 30 MIN   EXCISIONAL TOTAL KNEE ARTHROPLASTY WITH ANTIBIOTIC SPACERS Left 05/15/2021   Procedure: LEFT TOTAL KNEE IRRIGATION AND DEBRIDEMENT, EXCISION LEFT TOTAL KNEE ARTHROPLASTY WITH ANTIBIOTIC SPACERS;  Surgeon: Kathryne Hitch, MD;  [facility];  Service: Orthopedics;  Laterality: Left;   HERNIA REPAIR     20 years ago   I & D KNEE WITH POLY EXCHANGE Left 08/14/2020   Procedure: IRRIGATION AND DEBRIDEMENT LEFT KNEE with synovectomy WITH POLY EXCHANGE;  Surgeon: Kathryne Hitch, MD;  [facility];  Service: Orthopedics;  Laterality: Left;   KNEE ARTHROSCOPY Left    15 years ago   LYMPHADENECTOMY Bilateral 05/30/2017   Procedure: LYMPHADENECTOMY, PELVIC EXTENDED;  Surgeon: Heloise Purpura, MD;  [facility];  Service: Urology;  Laterality: Bilateral;   ROBOT ASSISTED LAPAROSCOPIC RADICAL PROSTATECTOMY N/A 05/30/2017   Procedure: XI ROBOTIC ASSISTED LAPAROSCOPIC RADICAL PROSTATECTOMY LEVEL 3;  Surgeon: Heloise Purpura, MD;  [facility];  Service: Urology;  Laterality: N/A;   SHOULDER ARTHROSCOPY Right    15 years ago   TOTAL KNEE ARTHROPLASTY Left 03/21/2019    Procedure: LEFT TOTAL KNEE ARTHROPLASTY;  Surgeon: Kathryne Hitch, MD;  [facility];  Service: Orthopedics;  Laterality: Left;   TOTAL KNEE REVISION Left 08/14/2021   Procedure: REMOVAL OF ANTIBIOTIC SPACER AND LEFT TOTAL KNEE REVISION ARTHROPLASTY;  Surgeon: Kathryne Hitch, MD;  [facility];  Service: Orthopedics;  Laterality: Left;    MEDICATIONS:  amoxicillin (AMOXIL) 500 MG tablet   aspirin 81 MG chewable tablet   celecoxib (CELEBREX) 200 MG capsule   Continuous Blood Gluc Sensor (FREESTYLE LIBRE 14 DAY SENSOR) MISC   diclofenac (VOLTAREN) 75 MG EC tablet   doxycycline (VIBRA-TABS) 100 MG tablet   escitalopram (LEXAPRO) 10 MG tablet   HYDROcodone-acetaminophen (NORCO) 7.5-325 MG tablet   insulin aspart (NOVOLOG) 100 UNIT/ML injection   losartan-hydrochlorothiazide (HYZAAR) 50-12.5 MG tablet   methocarbamol (ROBAXIN) 500 MG tablet   ondansetron (ZOFRAN) 4 MG tablet   polyethylene glycol (MIRALAX / GLYCOLAX) 17 g packet   rosuvastatin (CRESTOR) 20 MG tablet   senna (SENOKOT) 8.6 MG TABS tablet   tiZANidine (ZANAFLEX) 4 MG tablet   TRESIBA FLEXTOUCH 100 UNIT/ML SOPN FlexTouch Pen   No current facility-administered medications for  this encounter.   Marcille Blanco [facility]/Anesthesiology [place] Phone [phone] 03/21/2023 10:05 AM

## 2023-03-21 NOTE — Progress Notes (Signed)
A1C 8.2, results routed to Dr. Magnus Ivan

## 2023-03-24 DIAGNOSIS — M009 Pyogenic arthritis, unspecified: Principal | ICD-10-CM | POA: Insufficient documentation

## 2023-03-24 NOTE — H&P (Signed)
 [name] is an 68 y.o. male.   Chief Complaint: Left knee swelling and redness HPI: The patient is a 68 year old gentleman who had a left total knee arthroplasty performed in December 2020 secondary to severe arthritis.  He unfortunately ended up having significantly poorly controlled diabetes and developed an infection in that left knee.  He underwent an excision arthroplasty in February 2023 of all components and placement of an antibiotic spacer.  In May 2023 he underwent removal of antibiotic spacer.  It was felt that the knee was sterile and the infection cleared and a revision arthroplasty was performed.  Recently he has continued to have recurrent effusions and swelling with his left knee.  We have aspirated the knee just a few months ago and his white blood cell count was less than 3000 in that left knee.  The Gram stain was negative for any organisms and he has been off antibiotics.  He continues to have recurrent swelling and redness with that knee and there is definitely concern for an indolent infection given his past history.  His blood glucose is again under poor control with a recent hemoglobin A1c of 8.2.  At this point we have recommended an open arthrotomy with a synovectomy and a poly liner exchange with the hopes of identifying any type of organism that may be causing her chronic infection with that left knee.  His original infection was MRSA and that left knee.  Past Medical History:  Diagnosis Date   Arthritis    Diabetes mellitus without complication (HCC) 2019   Diabetes Type II   GERD (gastroesophageal reflux disease)    diet controlled   HTN (hypertension) 10/27/2018   Hyperlipidemia    Internal hemorrhoids 2014   noted on colonoscopy   MSSA (methicillin susceptible Staphylococcus aureus) infection 09/03/2020   Osteoarthritis of both knees    Prostate cancer (HCC)    prostate   Sigmoid diverticulosis 2014   Mild, noted on colonoscopy   Skin lesion 01/01/2021    Type 2 diabetes mellitus with other specified complication (HCC) 01/01/2021   Vaccine counseling 05/04/2021    Past Surgical History:  Procedure Laterality Date   CARPAL TUNNEL RELEASE Right 02/22/2019   Procedure: CARPAL TUNNEL RELEASE;  Surgeon: Margrette Taft BRAVO, MD;  [facility];  Service: Orthopedics;  Laterality: Right;   COLONOSCOPY N/A 09/13/2012   Procedure: COLONOSCOPY;  Surgeon: Margo LITTIE Haddock, MD;  [facility];  Service: Endoscopy;  Laterality: N/A;  9:30 AM   CYSTOSCOPY WITH URETHRAL DILATATION N/A 06/02/2020   Procedure: CYSTOSCOPY WITH URETHRAL DILATATION;  Surgeon: Renda Glance, MD;  [facility];  Service: Urology;  Laterality: N/A;  ONLY NEEDS 30 MIN   EXCISIONAL TOTAL KNEE ARTHROPLASTY WITH ANTIBIOTIC SPACERS Left 05/15/2021   Procedure: LEFT TOTAL KNEE IRRIGATION AND DEBRIDEMENT, EXCISION LEFT TOTAL KNEE ARTHROPLASTY WITH ANTIBIOTIC SPACERS;  Surgeon: Vernetta Lonni GRADE, MD;  [facility];  Service: Orthopedics;  Laterality: Left;   HERNIA REPAIR     20 years ago   I & D KNEE WITH POLY EXCHANGE Left 08/14/2020   Procedure: IRRIGATION AND DEBRIDEMENT LEFT KNEE with synovectomy WITH POLY EXCHANGE;  Surgeon: Vernetta Lonni GRADE, MD;  [facility];  Service: Orthopedics;  Laterality: Left;   KNEE ARTHROSCOPY Left    15 years ago   LYMPHADENECTOMY Bilateral 05/30/2017   Procedure: LYMPHADENECTOMY, PELVIC EXTENDED;  Surgeon: Renda Glance, MD;  [facility];  Service: Urology;  Laterality: Bilateral;   ROBOT ASSISTED  LAPAROSCOPIC RADICAL PROSTATECTOMY N/A 05/30/2017   Procedure: XI ROBOTIC ASSISTED LAPAROSCOPIC RADICAL PROSTATECTOMY LEVEL 3;  Surgeon: Renda Glance, MD;  [facility];  Service: Urology;  Laterality: N/A;   SHOULDER ARTHROSCOPY Right    15 years ago   TOTAL KNEE ARTHROPLASTY Left 03/21/2019   Procedure: LEFT TOTAL KNEE ARTHROPLASTY;  Surgeon: Vernetta Lonni GRADE, MD;  [facility];  Service: Orthopedics;   Laterality: Left;   TOTAL KNEE REVISION Left 08/14/2021   Procedure: REMOVAL OF ANTIBIOTIC SPACER AND LEFT TOTAL KNEE REVISION ARTHROPLASTY;  Surgeon: Vernetta Lonni GRADE, MD;  [facility];  Service: Orthopedics;  Laterality: Left;    Family History  Problem Relation Age of Onset   Cancer Mother        small cell lung   Social History:  reports that he has never smoked. He has never used smokeless tobacco. He reports that he does not drink alcohol and does not use drugs.  Allergies: No Known Allergies  No medications prior to admission.    No results found for this or any previous visit (from the past 48 hours). No results found.  Review of Systems  There were no vitals taken for this visit. Physical Exam Vitals reviewed.  Constitutional:      Appearance: Normal appearance. He is normal weight.  HENT:     Head: Normocephalic and atraumatic.  Eyes:     Extraocular Movements: Extraocular movements intact.     Pupils: Pupils are equal, round, and reactive to light.  Cardiovascular:     Rate and Rhythm: Normal rate and regular rhythm.  Pulmonary:     Effort: Pulmonary effort is normal.     Breath sounds: Normal breath sounds.  Abdominal:     Palpations: Abdomen is soft.  Musculoskeletal:     Cervical back: Normal range of motion and neck supple.     Left knee: Effusion and erythema present. Decreased range of motion. Tenderness present over the medial joint line and lateral joint line.  Neurological:     Mental Status: He is alert and oriented to person, place, and time.  Psychiatric:        Behavior: Behavior normal.      Assessment/Plan Possible chronic infection involving the left total knee revision arthroplasty  The plan is to proceed to surgery for an open arthrotomy with synovectomy and polyliner exchange.  We are trying to identify what organism may be present in the knee so than a determination if he made on the next steps in terms of another excision  arthroplasty versus other surgical options as a relates to dealing with a chronic prosthetic joint infection.  Lonni GRADE Vernetta, MD 03/24/2023, 5:07 PM

## 2023-03-25 ENCOUNTER — Inpatient Hospital Stay (HOSPITAL_COMMUNITY): Payer: PPO | Admitting: Anesthesiology

## 2023-03-25 ENCOUNTER — Other Ambulatory Visit: Payer: Self-pay

## 2023-03-25 ENCOUNTER — Encounter (HOSPITAL_COMMUNITY): Payer: Self-pay | Admitting: Orthopaedic Surgery

## 2023-03-25 ENCOUNTER — Inpatient Hospital Stay (HOSPITAL_COMMUNITY): Payer: PPO | Admitting: Medical

## 2023-03-25 ENCOUNTER — Inpatient Hospital Stay (HOSPITAL_COMMUNITY)
Admission: RE | Admit: 2023-03-25 | Discharge: 2023-03-30 | DRG: 486 | Disposition: A | Discharge status: Home Health | Payer: PPO | Attending: Orthopaedic Surgery | Admitting: Orthopaedic Surgery

## 2023-03-25 ENCOUNTER — Encounter (HOSPITAL_COMMUNITY)
Admission: RE | Disposition: A | Discharge status: Home Health | Payer: Self-pay | Source: Home / Self Care | Attending: Orthopaedic Surgery

## 2023-03-25 DIAGNOSIS — K219 Gastro-esophageal reflux disease without esophagitis: Secondary | ICD-10-CM | POA: Diagnosis present

## 2023-03-25 DIAGNOSIS — E669 Obesity, unspecified: Secondary | ICD-10-CM | POA: Diagnosis present

## 2023-03-25 DIAGNOSIS — Z6830 Body mass index (BMI) 30.0-30.9, adult: Secondary | ICD-10-CM | POA: Diagnosis not present

## 2023-03-25 DIAGNOSIS — E1165 Type 2 diabetes mellitus with hyperglycemia: Secondary | ICD-10-CM | POA: Diagnosis present

## 2023-03-25 DIAGNOSIS — Z79899 Other long term (current) drug therapy: Secondary | ICD-10-CM

## 2023-03-25 DIAGNOSIS — Z794 Long term (current) use of insulin: Secondary | ICD-10-CM | POA: Diagnosis not present

## 2023-03-25 DIAGNOSIS — M00862 Arthritis due to other bacteria, left knee: Secondary | ICD-10-CM | POA: Diagnosis present

## 2023-03-25 DIAGNOSIS — Z7982 Long term (current) use of aspirin: Secondary | ICD-10-CM

## 2023-03-25 DIAGNOSIS — E785 Hyperlipidemia, unspecified: Secondary | ICD-10-CM | POA: Diagnosis present

## 2023-03-25 DIAGNOSIS — Z8546 Personal history of malignant neoplasm of prostate: Secondary | ICD-10-CM | POA: Diagnosis not present

## 2023-03-25 DIAGNOSIS — I890 Lymphedema, not elsewhere classified: Secondary | ICD-10-CM | POA: Diagnosis present

## 2023-03-25 DIAGNOSIS — Y831 Surgical operation with implant of artificial internal device as the cause of abnormal reaction of the patient, or of later complication, without mention of misadventure at the time of the procedure: Secondary | ICD-10-CM | POA: Diagnosis present

## 2023-03-25 DIAGNOSIS — E119 Type 2 diabetes mellitus without complications: Secondary | ICD-10-CM

## 2023-03-25 DIAGNOSIS — E1169 Type 2 diabetes mellitus with other specified complication: Secondary | ICD-10-CM

## 2023-03-25 DIAGNOSIS — Z801 Family history of malignant neoplasm of trachea, bronchus and lung: Secondary | ICD-10-CM

## 2023-03-25 DIAGNOSIS — I10 Essential (primary) hypertension: Secondary | ICD-10-CM | POA: Diagnosis present

## 2023-03-25 DIAGNOSIS — T8454XA Infection and inflammatory reaction due to internal left knee prosthesis, initial encounter: Secondary | ICD-10-CM

## 2023-03-25 DIAGNOSIS — M009 Pyogenic arthritis, unspecified: Secondary | ICD-10-CM

## 2023-03-25 DIAGNOSIS — B957 Other staphylococcus as the cause of diseases classified elsewhere: Secondary | ICD-10-CM | POA: Diagnosis present

## 2023-03-25 DIAGNOSIS — Z7901 Long term (current) use of anticoagulants: Secondary | ICD-10-CM | POA: Diagnosis not present

## 2023-03-25 DIAGNOSIS — Z8614 Personal history of Methicillin resistant Staphylococcus aureus infection: Secondary | ICD-10-CM | POA: Diagnosis not present

## 2023-03-25 HISTORY — PX: I & D KNEE WITH POLY EXCHANGE: SHX5024

## 2023-03-25 LAB — TYPE AND SCREEN
ABO/RH(D): O POS
Antibody Screen: NEGATIVE

## 2023-03-25 LAB — GLUCOSE, CAPILLARY
Glucose-Capillary: 85 mg/dL (ref 70–99)
Glucose-Capillary: 102 mg/dL — ABNORMAL HIGH (ref 70–99)
Glucose-Capillary: 211 mg/dL — ABNORMAL HIGH (ref 70–99)
Glucose-Capillary: 220 mg/dL — ABNORMAL HIGH (ref 70–99)

## 2023-03-25 SURGERY — IRRIGATION AND DEBRIDEMENT KNEE WITH POLY EXCHANGE
Anesthesia: Monitor Anesthesia Care | Site: Knee | Laterality: Left

## 2023-03-25 MED ORDER — ONDANSETRON HCL 4 MG/2ML IJ SOLN: 4.0000 mg | Freq: Once | INTRAMUSCULAR | Status: DC | PRN

## 2023-03-25 MED ORDER — TOBRAMYCIN SULFATE 1.2 G IJ SOLR
INTRAMUSCULAR | Status: DC | PRN
  Administered 2023-03-25: 1.2 g via TOPICAL

## 2023-03-25 MED ORDER — OXYCODONE HCL 5 MG PO TABS
10.0000 mg | ORAL_TABLET | ORAL | Status: DC | PRN
  Administered 2023-03-26 – 2023-03-28 (×5): 15 mg via ORAL
  Filled 2023-03-25: qty 3
  Filled 2023-03-25: qty 2
  Filled 2023-03-25 (×2): qty 3
  Filled 2023-03-25: qty 2
  Filled 2023-03-25: qty 3
  Filled 2023-03-25: qty 2

## 2023-03-25 MED ORDER — ACETAMINOPHEN 160 MG/5ML PO SOLN: 325.0000 mg | ORAL | Status: DC | PRN

## 2023-03-25 MED ORDER — PROPOFOL 10 MG/ML IV BOLUS
INTRAVENOUS | Status: DC | PRN
  Administered 2023-03-25: 30 mg via INTRAVENOUS

## 2023-03-25 MED ORDER — METHOCARBAMOL 500 MG PO TABS
500.0000 mg | ORAL_TABLET | Freq: Four times a day (QID) | ORAL | Status: DC | PRN
  Administered 2023-03-25 – 2023-03-28 (×4): 500 mg via ORAL
  Filled 2023-03-25 (×4): qty 1

## 2023-03-25 MED ORDER — DEXAMETHASONE SODIUM PHOSPHATE 10 MG/ML IJ SOLN
INTRAMUSCULAR | Status: AC
  Filled 2023-03-25: qty 1

## 2023-03-25 MED ORDER — METHOCARBAMOL 1000 MG/10ML IJ SOLN: 500.0000 mg | Freq: Four times a day (QID) | INTRAMUSCULAR | Status: DC | PRN

## 2023-03-25 MED ORDER — PANTOPRAZOLE SODIUM 40 MG PO TBEC
40.0000 mg | DELAYED_RELEASE_TABLET | Freq: Every day | ORAL | Status: DC
  Administered 2023-03-26 – 2023-03-30 (×5): 40 mg via ORAL
  Filled 2023-03-25 (×5): qty 1

## 2023-03-25 MED ORDER — DOCUSATE SODIUM 100 MG PO CAPS
100.0000 mg | ORAL_CAPSULE | Freq: Two times a day (BID) | ORAL | Status: DC
  Administered 2023-03-25 – 2023-03-30 (×10): 100 mg via ORAL
  Filled 2023-03-25 (×10): qty 1

## 2023-03-25 MED ORDER — ONDANSETRON HCL 4 MG/2ML IJ SOLN: 4.0000 mg | Freq: Four times a day (QID) | INTRAMUSCULAR | Status: DC | PRN

## 2023-03-25 MED ORDER — INSULIN ASPART 100 UNIT/ML ~~LOC~~ SOLN: 10.0000 [IU] | Freq: Three times a day (TID) | SUBCUTANEOUS | Status: DC

## 2023-03-25 MED ORDER — INSULIN ASPART 100 UNIT/ML IJ SOLN
20.0000 [IU] | Freq: Once | INTRAMUSCULAR | Status: AC
  Administered 2023-03-25: 20 [IU] via SUBCUTANEOUS

## 2023-03-25 MED ORDER — MIDAZOLAM HCL 5 MG/5ML IJ SOLN
INTRAMUSCULAR | Status: DC | PRN
  Administered 2023-03-25: 2 mg via INTRAVENOUS

## 2023-03-25 MED ORDER — TRANEXAMIC ACID-NACL 1000-0.7 MG/100ML-% IV SOLN
1000.0000 mg | INTRAVENOUS | Status: AC
  Administered 2023-03-25: 1000 mg via INTRAVENOUS
  Filled 2023-03-25: qty 100

## 2023-03-25 MED ORDER — LOSARTAN POTASSIUM 50 MG PO TABS
50.0000 mg | ORAL_TABLET | Freq: Every day | ORAL | Status: DC
  Administered 2023-03-26 – 2023-03-30 (×5): 50 mg via ORAL
  Filled 2023-03-25 (×5): qty 1

## 2023-03-25 MED ORDER — 0.9 % SODIUM CHLORIDE (POUR BTL) OPTIME
TOPICAL | Status: DC | PRN
  Administered 2023-03-25: 1000 mL

## 2023-03-25 MED ORDER — ESCITALOPRAM OXALATE 10 MG PO TABS
10.0000 mg | ORAL_TABLET | Freq: Every day | ORAL | Status: DC
  Administered 2023-03-26 – 2023-03-30 (×5): 10 mg via ORAL
  Filled 2023-03-25 (×5): qty 1

## 2023-03-25 MED ORDER — LACTATED RINGERS IV SOLN: INTRAVENOUS | Status: DC

## 2023-03-25 MED ORDER — DEXAMETHASONE SODIUM PHOSPHATE 10 MG/ML IJ SOLN
INTRAMUSCULAR | Status: DC | PRN
  Administered 2023-03-25: 10 mg via INTRAVENOUS

## 2023-03-25 MED ORDER — PHENYLEPHRINE HCL-NACL 20-0.9 MG/250ML-% IV SOLN
INTRAVENOUS | Status: AC
  Filled 2023-03-25: qty 250

## 2023-03-25 MED ORDER — RIVAROXABAN 10 MG PO TABS
10.0000 mg | ORAL_TABLET | Freq: Every day | ORAL | Status: DC
  Administered 2023-03-26 – 2023-03-30 (×5): 10 mg via ORAL
  Filled 2023-03-25 (×5): qty 1

## 2023-03-25 MED ORDER — INSULIN DEGLUDEC 100 UNIT/ML ~~LOC~~ SOPN: 90.0000 [IU] | PEN_INJECTOR | Freq: Every day | SUBCUTANEOUS | Status: DC

## 2023-03-25 MED ORDER — FENTANYL CITRATE PF 50 MCG/ML IJ SOSY
25.0000 ug | PREFILLED_SYRINGE | INTRAMUSCULAR | Status: DC | PRN
  Administered 2023-03-25: 50 ug via INTRAVENOUS

## 2023-03-25 MED ORDER — VANCOMYCIN HCL 1000 MG IV SOLR
INTRAVENOUS | Status: AC
  Filled 2023-03-25: qty 20

## 2023-03-25 MED ORDER — VANCOMYCIN HCL 1000 MG IV SOLR
INTRAVENOUS | Status: DC | PRN
  Administered 2023-03-25: 1000 mg via TOPICAL

## 2023-03-25 MED ORDER — ONDANSETRON HCL 4 MG PO TABS: 4.0000 mg | ORAL_TABLET | Freq: Four times a day (QID) | ORAL | Status: DC | PRN

## 2023-03-25 MED ORDER — METOCLOPRAMIDE HCL 5 MG PO TABS: 5.0000 mg | ORAL_TABLET | Freq: Three times a day (TID) | ORAL | Status: DC | PRN

## 2023-03-25 MED ORDER — PRONTOSAN WOUND IRRIGATION OPTIME
TOPICAL | Status: DC | PRN
  Administered 2023-03-25: 1 via TOPICAL

## 2023-03-25 MED ORDER — CHLORHEXIDINE GLUCONATE 0.12 % MT SOLN
15.0000 mL | Freq: Once | OROMUCOSAL | Status: AC
Start: 2023-03-25 — End: 2023-03-25
  Administered 2023-03-25: 15 mL via OROMUCOSAL

## 2023-03-25 MED ORDER — FENTANYL CITRATE (PF) 100 MCG/2ML IJ SOLN
INTRAMUSCULAR | Status: AC
  Filled 2023-03-25: qty 2

## 2023-03-25 MED ORDER — PHENOL 1.4 % MT LIQD: 1.0000 | OROMUCOSAL | Status: DC | PRN

## 2023-03-25 MED ORDER — OXYCODONE HCL 5 MG PO TABS
5.0000 mg | ORAL_TABLET | ORAL | Status: DC | PRN
  Administered 2023-03-25 – 2023-03-30 (×8): 10 mg via ORAL
  Filled 2023-03-25 (×3): qty 2
  Filled 2023-03-25: qty 1
  Filled 2023-03-25 (×5): qty 2

## 2023-03-25 MED ORDER — FENTANYL CITRATE PF 50 MCG/ML IJ SOSY
PREFILLED_SYRINGE | INTRAMUSCULAR | Status: AC
  Filled 2023-03-25: qty 1

## 2023-03-25 MED ORDER — METOCLOPRAMIDE HCL 5 MG/ML IJ SOLN
5.0000 mg | Freq: Three times a day (TID) | INTRAMUSCULAR | Status: DC | PRN
Start: 2023-03-25 — End: 2023-03-30

## 2023-03-25 MED ORDER — VANCOMYCIN HCL 1250 MG/250ML IV SOLN
1250.0000 mg | Freq: Two times a day (BID) | INTRAVENOUS | Status: DC
  Administered 2023-03-25 – 2023-03-30 (×10): 1250 mg via INTRAVENOUS
  Filled 2023-03-25 (×10): qty 250

## 2023-03-25 MED ORDER — ACETAMINOPHEN 325 MG PO TABS: 325.0000 mg | ORAL_TABLET | Freq: Four times a day (QID) | ORAL | Status: DC | PRN

## 2023-03-25 MED ORDER — ACETAMINOPHEN 325 MG PO TABS: 325.0000 mg | ORAL_TABLET | ORAL | Status: DC | PRN

## 2023-03-25 MED ORDER — VANCOMYCIN HCL 1000 MG IV SOLR
INTRAVENOUS | Status: DC | PRN
  Administered 2023-03-25: 1000 mg via INTRAVENOUS

## 2023-03-25 MED ORDER — VANCOMYCIN HCL IN DEXTROSE 1-5 GM/200ML-% IV SOLN
INTRAVENOUS | Status: AC
  Filled 2023-03-25: qty 200

## 2023-03-25 MED ORDER — FENTANYL CITRATE (PF) 100 MCG/2ML IJ SOLN
INTRAMUSCULAR | Status: DC | PRN
  Administered 2023-03-25 (×2): 50 ug via INTRAVENOUS

## 2023-03-25 MED ORDER — HYDROMORPHONE HCL 1 MG/ML IJ SOLN
INTRAMUSCULAR | Status: DC | PRN
  Administered 2023-03-25 (×2): 1 mg via INTRAVENOUS

## 2023-03-25 MED ORDER — ORAL CARE MOUTH RINSE: 15.0000 mL | Freq: Once | OROMUCOSAL | Status: AC

## 2023-03-25 MED ORDER — LOSARTAN POTASSIUM-HCTZ 50-12.5 MG PO TABS: 1.0000 | ORAL_TABLET | Freq: Every day | ORAL | Status: DC

## 2023-03-25 MED ORDER — INSULIN ASPART 100 UNIT/ML IJ SOLN: 0.0000 [IU] | Freq: Three times a day (TID) | INTRAMUSCULAR | Status: DC

## 2023-03-25 MED ORDER — MIDAZOLAM HCL 2 MG/2ML IJ SOLN
1.0000 mg | Freq: Once | INTRAMUSCULAR | Status: AC
  Administered 2023-03-25: 2 mg via INTRAVENOUS
  Filled 2023-03-25: qty 2

## 2023-03-25 MED ORDER — PROPOFOL 1000 MG/100ML IV EMUL
INTRAVENOUS | Status: AC
  Filled 2023-03-25: qty 100

## 2023-03-25 MED ORDER — MEPERIDINE HCL 50 MG/ML IJ SOLN
6.2500 mg | INTRAMUSCULAR | Status: DC | PRN
Start: 2023-03-25 — End: 2023-03-25

## 2023-03-25 MED ORDER — SODIUM CHLORIDE 0.9 % IV SOLN: INTRAVENOUS | Status: AC

## 2023-03-25 MED ORDER — INSULIN ASPART 100 UNIT/ML IJ SOLN
0.0000 [IU] | INTRAMUSCULAR | Status: DC | PRN
Start: 2023-03-25 — End: 2023-03-25

## 2023-03-25 MED ORDER — HYDROCHLOROTHIAZIDE 12.5 MG PO TABS
12.5000 mg | ORAL_TABLET | Freq: Every day | ORAL | Status: DC
  Administered 2023-03-26 – 2023-03-30 (×5): 12.5 mg via ORAL
  Filled 2023-03-25 (×5): qty 1

## 2023-03-25 MED ORDER — BUPIVACAINE-EPINEPHRINE 0.25% -1:200000 IJ SOLN
INTRAMUSCULAR | Status: AC
  Filled 2023-03-25: qty 1

## 2023-03-25 MED ORDER — PNEUMOCOCCAL 20-VAL CONJ VACC 0.5 ML IM SUSY
0.5000 mL | PREFILLED_SYRINGE | INTRAMUSCULAR | Status: DC
  Filled 2023-03-25: qty 0.5

## 2023-03-25 MED ORDER — MUPIROCIN 2 % EX OINT: 1.0000 | TOPICAL_OINTMENT | Freq: Two times a day (BID) | CUTANEOUS | 0 refills | Status: AC

## 2023-03-25 MED ORDER — SODIUM CHLORIDE 0.9 % IR SOLN
Status: DC | PRN
  Administered 2023-03-25 (×2): 3000 mL

## 2023-03-25 MED ORDER — INSULIN ASPART 100 UNIT/ML IJ SOLN
0.0000 [IU] | Freq: Every day | INTRAMUSCULAR | Status: DC
  Administered 2023-03-25: 2 [IU] via SUBCUTANEOUS

## 2023-03-25 MED ORDER — ONDANSETRON HCL 4 MG/2ML IJ SOLN
INTRAMUSCULAR | Status: AC
  Filled 2023-03-25: qty 2

## 2023-03-25 MED ORDER — MENTHOL 3 MG MT LOZG: 1.0000 | LOZENGE | OROMUCOSAL | Status: DC | PRN

## 2023-03-25 MED ORDER — ALUM & MAG HYDROXIDE-SIMETH 200-200-20 MG/5ML PO SUSP: 30.0000 mL | ORAL | Status: DC | PRN

## 2023-03-25 MED ORDER — ROPIVACAINE HCL 5 MG/ML IJ SOLN
INTRAMUSCULAR | Status: DC | PRN
Start: 2023-03-25 — End: 2023-03-25
  Administered 2023-03-25: 20 mL via PERINEURAL

## 2023-03-25 MED ORDER — HYDROMORPHONE HCL 2 MG/ML IJ SOLN
INTRAMUSCULAR | Status: AC
  Filled 2023-03-25: qty 1

## 2023-03-25 MED ORDER — CHLORHEXIDINE GLUCONATE 4 % EX SOLN: 1.0000 | CUTANEOUS | 1 refills | Status: DC

## 2023-03-25 MED ORDER — FENTANYL CITRATE PF 50 MCG/ML IJ SOSY
50.0000 ug | PREFILLED_SYRINGE | Freq: Once | INTRAMUSCULAR | Status: AC
  Administered 2023-03-25: 100 ug via INTRAVENOUS
  Filled 2023-03-25: qty 2

## 2023-03-25 MED ORDER — INSULIN GLARGINE-YFGN 100 UNIT/ML ~~LOC~~ SOLN
90.0000 [IU] | Freq: Every day | SUBCUTANEOUS | Status: DC
  Administered 2023-03-26 – 2023-03-30 (×5): 90 [IU] via SUBCUTANEOUS
  Filled 2023-03-25 (×5): qty 0.9

## 2023-03-25 MED ORDER — PROPOFOL 500 MG/50ML IV EMUL
INTRAVENOUS | Status: DC | PRN
  Administered 2023-03-25: 75 ug/kg/min via INTRAVENOUS

## 2023-03-25 MED ORDER — TOBRAMYCIN SULFATE 1.2 G IJ SOLR
INTRAMUSCULAR | Status: AC
  Filled 2023-03-25: qty 1.2

## 2023-03-25 MED ORDER — PHENYLEPHRINE HCL-NACL 20-0.9 MG/250ML-% IV SOLN
INTRAVENOUS | Status: DC | PRN
  Administered 2023-03-25: 50 ug/min via INTRAVENOUS

## 2023-03-25 MED ORDER — OXYCODONE HCL 5 MG PO TABS
5.0000 mg | ORAL_TABLET | Freq: Once | ORAL | Status: AC | PRN
  Administered 2023-03-25: 5 mg via ORAL

## 2023-03-25 MED ORDER — HYDROMORPHONE HCL 1 MG/ML IJ SOLN
0.5000 mg | INTRAMUSCULAR | Status: DC | PRN
  Administered 2023-03-26 – 2023-03-27 (×4): 1 mg via INTRAVENOUS
  Filled 2023-03-25 (×4): qty 1

## 2023-03-25 MED ORDER — OXYCODONE HCL 5 MG/5ML PO SOLN: 5.0000 mg | Freq: Once | ORAL | Status: AC | PRN

## 2023-03-25 MED ORDER — MIDAZOLAM HCL 2 MG/2ML IJ SOLN
INTRAMUSCULAR | Status: AC
  Filled 2023-03-25: qty 2

## 2023-03-25 MED ORDER — OXYCODONE HCL 5 MG PO TABS
ORAL_TABLET | ORAL | Status: AC
  Filled 2023-03-25: qty 1

## 2023-03-25 SURGICAL SUPPLY — 41 items
ANCHOR PEEK ZIP 5.5 NDL NO2 (Orthopedic Implant) IMPLANT
BAG COUNTER SPONGE SURGICOUNT (BAG) IMPLANT
BAG ZIPLOCK 12X15 (MISCELLANEOUS) ×1 IMPLANT
BNDG ELASTIC 4INX 5YD STR LF (GAUZE/BANDAGES/DRESSINGS) ×1 IMPLANT
BNDG ELASTIC 6INX 5YD STR LF (GAUZE/BANDAGES/DRESSINGS) ×1 IMPLANT
COVER SURGICAL LIGHT HANDLE (MISCELLANEOUS) ×1 IMPLANT
CUFF TRNQT CYL 34X4.125X (TOURNIQUET CUFF) ×1 IMPLANT
DRAPE U-SHAPE 47X51 STRL (DRAPES) ×1 IMPLANT
DRSG ADAPTIC 3X8 NADH LF (GAUZE/BANDAGES/DRESSINGS) ×1 IMPLANT
ELECT REM PT RETURN 15FT ADLT (MISCELLANEOUS) ×1 IMPLANT
GAUZE PAD ABD 8X10 STRL (GAUZE/BANDAGES/DRESSINGS) ×2 IMPLANT
GAUZE SPONGE 4X4 12PLY STRL (GAUZE/BANDAGES/DRESSINGS) ×2 IMPLANT
GAUZE XEROFORM 1X8 LF (GAUZE/BANDAGES/DRESSINGS) IMPLANT
GLOVE BIO SURGEON STRL SZ7.5 (GLOVE) ×1 IMPLANT
GLOVE BIOGEL PI IND STRL 8 (GLOVE) ×2 IMPLANT
GLOVE ECLIPSE 8.0 STRL XLNG CF (GLOVE) ×1 IMPLANT
GOWN STRL REUS W/ TWL XL LVL3 (GOWN DISPOSABLE) ×2 IMPLANT
IMMOBILIZER KNEE 20 (SOFTGOODS) IMPLANT
IMMOBILIZER KNEE 20 THIGH 36 (SOFTGOODS) IMPLANT
INSERT RTNG PLTFRM SZ7 12 KNEE (Miscellaneous) IMPLANT
INSERT TIB CRS ATTUNE SZ7 14 (Insert) IMPLANT
KIT STIMULAN RAPID CURE 10CC (Orthopedic Implant) IMPLANT
KIT TURNOVER KIT A (KITS) IMPLANT
NS IRRIG 1000ML POUR BTL (IV SOLUTION) ×1 IMPLANT
PACK TOTAL KNEE CUSTOM (KITS) ×1 IMPLANT
PAD ORTHO SHOULDER 7X19 LRG (SOFTGOODS) IMPLANT
PADDING CAST COTTON 6X4 STRL (CAST SUPPLIES) IMPLANT
PROTECTOR NERVE ULNAR (MISCELLANEOUS) ×1 IMPLANT
SET HNDPC FAN SPRY TIP SCT (DISPOSABLE) ×1 IMPLANT
SPIKE FLUID TRANSFER (MISCELLANEOUS) ×1 IMPLANT
SPONGE T-LAP 18X18 ~~LOC~~+RFID (SPONGE) IMPLANT
STRIP CLOSURE SKIN 1/2X4 (GAUZE/BANDAGES/DRESSINGS) ×2 IMPLANT
SUT ETHIBOND NAB CT1 #1 30IN (SUTURE) IMPLANT
SUT ETHILON 2 0 PS N (SUTURE) IMPLANT
SUT MNCRL AB 4-0 PS2 18 (SUTURE) ×1 IMPLANT
SUT VIC AB 0 CT1 27XBRD ANTBC (SUTURE) IMPLANT
SUT VIC AB 1 CT1 36 (SUTURE) ×3 IMPLANT
SUT VIC AB 2-0 CT1 TAPERPNT 27 (SUTURE) ×3 IMPLANT
SWAB COLLECTION DEVICE MRSA (MISCELLANEOUS) ×1 IMPLANT
SWAB CULTURE ESWAB REG 1ML (MISCELLANEOUS) ×1 IMPLANT
TRAY FOLEY MTR SLVR 16FR STAT (SET/KITS/TRAYS/PACK) ×1 IMPLANT

## 2023-03-25 NOTE — Anesthesia Procedure Notes (Signed)
 Anesthesia Regional Block: Adductor canal block   Pre-Anesthetic Checklist: , timeout performed,  Correct Patient, Correct Site, Correct Laterality,  Correct Procedure, Correct Position, site marked,  Risks and benefits discussed,  Surgical consent,  Pre-op evaluation,  At surgeon's request and post-op pain management  Laterality: Left  Prep: chloraprep       Needles:  Injection technique: Single-shot  Needle Type: Echogenic Stimulator Needle     Needle Length: 5cm  Needle Gauge: 22     Additional Needles:   Procedures:,,,, ultrasound used (permanent image in chart),,    Narrative:  Start time: 03/25/2023 3:00 PM End time: 03/25/2023 3:06 PM Injection made incrementally with aspirations every 5 mL.  Performed by: Personally  Anesthesiologist: Mallory Manus, MD  Additional Notes: Functioning IV was confirmed and monitors were applied.  A 50mm 22ga Arrow echogenic stimulator needle was used. Sterile prep and drape,hand hygiene and sterile gloves were used. Ultrasound guidance: relevant anatomy identified, needle position confirmed, local anesthetic spread visualized around nerve(s)., vascular puncture avoided.  Image printed for medical record. Negative aspiration and negative test dose prior to incremental administration of local anesthetic. The patient tolerated the procedure well.

## 2023-03-25 NOTE — Progress Notes (Signed)
 Patient's glucose level is 220. The patient requested his home dose of insulin, which is 20 units of Novolog. Dr. Magnus Ivan has been notified and has agreed to administer the same dosage. A verbal order was given and documented by the RN.

## 2023-03-25 NOTE — Discharge Instructions (Addendum)
 Information on my medicine - XARELTO  (Rivaroxaban )  Why was Xarelto  prescribed for you? Xarelto  was prescribed for you to reduce the risk of blood clots forming after orthopedic surgery. The medical term for these abnormal blood clots is venous thromboembolism (VTE).  The typical prophylactic dosing of 10 mg once a day has been INCREASED to 20 mg once a day while you are concurrently taking Rifampin . If you cannot tolerate or discontinue Rifampin  with approval of the ID physician - your dosing will need to be reduced back down to 10 mg once a day for the intended duration decided by your orthopedic team.   What do you need to know about xarelto  ? Take your Xarelto  ONCE DAILY with food - typically with supper as this is the biggest meal of the day.   If you have difficulty swallowing the tablet whole, you may crush it and mix in applesauce just prior to taking your dose.  Take Xarelto  exactly as prescribed by your doctor and DO NOT stop taking Xarelto  without talking to the doctor who prescribed the medication.  Stopping without other VTE prevention medication to take the place of Xarelto  may increase your risk of developing a clot.  After discharge, you should have regular check-up appointments with your healthcare provider that is prescribing your Xarelto .    What do you do if you miss a dose? If you miss a dose, take it as soon as you remember on the same day then continue your regularly scheduled once daily regimen the next day. Do not take two doses of Xarelto  on the same day.   Important Safety Information A possible side effect of Xarelto  is bleeding. You should call your healthcare provider right away if you experience any of the following: Bleeding from an injury or your nose that does not stop. Unusual colored urine (red or dark brown) or unusual colored stools (red or black). Unusual bruising for unknown reasons. A serious fall or if you hit your head (even if there is  no bleeding).  Some medicines may interact with Xarelto  and might increase your risk of bleeding while on Xarelto . To help avoid this, consult your healthcare provider or pharmacist prior to using any new prescription or non-prescription medications, including herbals, vitamins, non-steroidal anti-inflammatory drugs (NSAIDs) and supplements.  This website has more information on Xarelto : www.xarelto .com.

## 2023-03-25 NOTE — Progress Notes (Signed)
 Pharmacy Antibiotic Note  [name] is a 68 y.o. male admitted on 03/25/2023 with chronic infection of left total knee revision arthroplasty. S/p open synovectomy with incision and drainage left knee, polyliner exchange left knee in OR today. Patient received Vancomycin  1g IV x 1 today at 1637. Pharmacy has been consulted for Vancomycin  dosing post-operatively.  Plan: Vancomycin  1250mg  IV q12h Vancomycin  levels at steady state, as indicated Monitor renal function, cultures, clinical course  Height: 5' 9 (175.3 cm) Weight: 93 kg (205 lb) IBW/kg (Calculated) : 70.7  Temp (24hrs), Avg:97.9 F (36.6 C), Min:97.4 F (36.3 C), Max:98.3 F (36.8 C)  No results for input(s): WBC, CREATININE, LATICACIDVEN, VANCOTROUGH, VANCOPEAK, VANCORANDOM, GENTTROUGH, GENTPEAK, GENTRANDOM, TOBRATROUGH, TOBRAPEAK, TOBRARND, AMIKACINPEAK, AMIKACINTROU, AMIKACIN in the last 168 hours.  Estimated Creatinine Clearance: 98.4 mL/min (by C-G formula based on SCr of 0.82 mg/dL).    No Known Allergies  Antimicrobials this admission: 1/3 IV Vancomycin  >>  Dose adjustments this admission: --  Microbiology results: 1/3 Synovial fluid:  Thank you for allowing pharmacy to be a part of this patient's care.   Skylee Baird, PharmD, BCPS Clinical Pharmacist 03/25/2023 9:38 PM

## 2023-03-25 NOTE — Interval H&P Note (Signed)
 History and Physical Interval Note: Patient understands he is here today due to continued swelling and effusion of his left knee combined with redness.  We are concerned about a chronic indolent infection in that left knee revision arthroplasty.  The plan is to proceed to surgery today for an open arthrotomy with synovectomy and sending off soft tissue and fluid samples for organism assessment.  We are going to change out the poly liner and washout the knee as much as possible with the hope of retaining the implants.  He understands that there is a chance of eventually needing other excision arthroplasty due to a chronic infection.  The risks and benefits of surgery been discussed in detail.  There is been no acute or interval change in medical status.  Informed consent has been obtained and the left operative knee has been marked.  03/25/2023 2:18 PM  [name]  has presented today for surgery, with the diagnosis of chronic left knee infection.  The various methods of treatment have been discussed with the patient and family. After consideration of risks, benefits and other options for treatment, the patient has consented to  Procedure(s): INCISION AND DRAINAGE LEFT KNEE, SYNOVECTOMY,  POLY-LINER EXCHANGE (Left) as a surgical intervention.  The patient's history has been reviewed, patient examined, no change in status, stable for surgery.  I have reviewed the patient's chart and labs.  Questions were answered to the patient's satisfaction.     [name]

## 2023-03-25 NOTE — Op Note (Signed)
 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 Item Serial No. Manufacturer Lot No. LRB No. Used Action  INSERT TIB CRS ATTUNE SZ7 14 - [id number] Insert INSERT TIB CRS ATTUNE SZ7 14  [employer]PU[employer] [id number] Left 1 Explanted  ANCHOR PEEK ZIP 5.5 NDL NO2 - [id number] Orthopedic Implant ANCHOR PEEK ZIP 5.5 NDL NO2  [employer] [id number] Left 1 Implanted  KIT STIMULAN RAPID CURE 10CC - [id number] Orthopedic Implant KIT STIMULAN RAPID CURE 10CC  [employer] [id number] Left 1 Implanted  INSERT TIB CRS ATTUNE SZ7 14 - [id number] Insert INSERT TIB CRS ATTUNE SZ7 14  [employer] [id number] Left 1 Implanted   Surgeon: Lonni GRADE. [place], MD Assistant: Tory Gaskins, PA-C  Anesthesia: #1 left lower extremity adductor canal block, #2 spinal Tourniquet time: 90 minutes EBL: 100 cc Complications: None Antibiotics: IV vancomycin  after cultures obtained  Indications: The patient is a 68 year old gentleman who underwent a left primary total knee arthroplasty in December 2020.  He unfortunately then developed an infection in that knee and in February 2023 underwent an excision arthroplasty of all components and placement of an antibiotic spacer.  He then was eventually returned to the operating room in May 2023 and underwent a revision arthroplasty with removal of the antibiotic spacer and placement of revision components in his knee.  Since that time he has had intermittent swelling with his knee.  We have aspirated on  several occasions and found white blood cell count of less than 5000 and no organisms.  He has been off antibiotics.  Unfortunately he has medial compromise.  His diabetes is under poor control with the most recent A1c of over 8.0.  He is also dealing with metastatic prostate cancer.  We have recommended to return to the operating room today given swelling and redness with his knee for an open arthrotomy with a synovectomy and incision and drainage with poly liner exchange.  We are looking at a situation of probably chronic suppression and potentially and most likely further surgery if the infection is not kept under control.  He understands that eventually he could end up with even a knee fusion or an above-knee amputation.  Procedure description: After informed consent was obtained appropriate left knee was marked, the patient had an adductor canal block of the left lower extremity holding room.  He was then brought to the operating room and set up on the operative table where spinal anesthesia was the tourniquet a timeout was called and he was then divided scar patient the correct left knee.  With the knee extended we then made incision over the patella and carried this proximally distally.  We dissected down the joint and found a small area of the inferior aspect of incision that had slight opening.  There was gross purulence in this area so we did send off fluid samples for Gram stain and culture.  We then performed an arthrotomy and opened up the knee in's entirety.  There was concerned about some bone loss at the superior medial aspect of the tibial plateau.  He does have longstem implants.  There was no gross evidence of loosening of the implants but they are longstem.  We took some time but we were able to remove his revision poly constrained liner.  We then performed a synovectomy in all 3 compartments removing is much inflamed tissue.  We did send off soft tissue for Gram stain and culture as well as  soft tissue for pathologic assessment for inflammatory cells per high-powered field.  There is concerned that this will not eradicate infection at all.  Once we have thoroughly irrigated the knee with normal saline solution and pulsatile lavage solution.  We then irrigated the knee with antibiotic solution.  After that we did use some calcium  pyrophosphate beads that we mixed with tobramycin  and we packed into the superior medial proximal tibia.  We then placed antibiotic vancomycin  powder throughout the arthrotomy and the knee itself.  The patient had such limited motion and scar tissue prior to surgery that we decided to go down to a size 12 thickness constrained poly and placed this within his knee but his knee was not stable so we had to remove that poly liner and go up to a size 14 constrained poly liner.  This gave him the stability that we appreciated.  We then took a long period time to close the arthrotomy.  Some of the patella tendon did pull off of the bone so we had to place a a suture anchor with FiberWire suture into the proximal tibia to secure the tibial tendon back to the bone.  We then closed the arthrotomy its entirety.  The tourniquet was let down.  We then closed 0 Vicryl to close subcutaneous tissue and closed the skin with interrupted 2-0 nylon suture.  Well-padded compression dressing was applied.  The patient was taken to the recovery room in stable condition.  Tory Gaskins, PA-C did assist during the entire case from beginning to end and his assistance was crucial and medically necessary throughout the case with soft tissue management and retraction, helping guide implant placement and helping with a layered closure of the wound.

## 2023-03-25 NOTE — Transfer of Care (Signed)
 Immediate Anesthesia Transfer of Care Note  Patient: [name]  Procedure(s) Performed: INCISION AND DRAINAGE LEFT KNEE, SYNOVECTOMY,  POLY-LINER EXCHANGE (Left: Knee)  Patient Location: PACU  Anesthesia Type:Spinal  Level of Consciousness: awake and patient cooperative  Airway & Oxygen Therapy: Patient Spontanous Breathing and Patient connected to face mask  Post-op Assessment: Report given to RN and Post -op Vital signs reviewed and stable  Post vital signs: Reviewed and stable  Last Vitals:  Vitals Value Taken Time  BP    Temp    Pulse    Resp    SpO2      Last Pain:  Vitals:   03/25/23 1500  TempSrc:   PainSc: 0-No pain         Complications: No notable events documented.

## 2023-03-25 NOTE — Plan of Care (Signed)
   Problem: Education: Goal: Knowledge of General Education information will improve Description Including pain rating scale, medication(s)/side effects and non-pharmacologic comfort measures Outcome: Progressing

## 2023-03-26 ENCOUNTER — Other Ambulatory Visit: Payer: Self-pay

## 2023-03-26 LAB — CBC
HCT: 34.5 % — ABNORMAL LOW (ref 39.0–52.0)
Hemoglobin: 11.5 g/dL — ABNORMAL LOW (ref 13.0–17.0)
MCH: 28.8 pg (ref 26.0–34.0)
MCHC: 33.3 g/dL (ref 30.0–36.0)
MCV: 86.5 fL (ref 80.0–100.0)
Platelets: 203 10*3/uL (ref 150–400)
RBC: 3.99 MIL/uL — ABNORMAL LOW (ref 4.22–5.81)
RDW: 13.9 % (ref 11.5–15.5)
WBC: 7.3 10*3/uL (ref 4.0–10.5)
nRBC: 0 % (ref 0.0–0.2)

## 2023-03-26 LAB — GLUCOSE, CAPILLARY
Glucose-Capillary: 145 mg/dL — ABNORMAL HIGH (ref 70–99)
Glucose-Capillary: 203 mg/dL — ABNORMAL HIGH (ref 70–99)
Glucose-Capillary: 251 mg/dL — ABNORMAL HIGH (ref 70–99)
Glucose-Capillary: 205 mg/dL — ABNORMAL HIGH (ref 70–99)
Glucose-Capillary: 192 mg/dL — ABNORMAL HIGH (ref 70–99)

## 2023-03-26 LAB — BASIC METABOLIC PANEL
Anion gap: 8 (ref 5–15)
BUN: 23 mg/dL (ref 8–23)
CO2: 24 mmol/L (ref 22–32)
Calcium: 8.9 mg/dL (ref 8.9–10.3)
Chloride: 106 mmol/L (ref 98–111)
Creatinine, Ser: 0.91 mg/dL (ref 0.61–1.24)
GFR, Estimated: >60 mL/min (ref >60)
Glucose, Bld: 167 mg/dL — ABNORMAL HIGH (ref 70–99)
Potassium: 4.9 mmol/L (ref 3.5–5.1)
Sodium: 138 mmol/L (ref 135–145)

## 2023-03-26 MED ORDER — INSULIN ASPART 100 UNIT/ML IJ SOLN: 0.0000 [IU] | Freq: Every day | INTRAMUSCULAR | Status: DC

## 2023-03-26 MED ORDER — CHLORHEXIDINE GLUCONATE CLOTH 2 % EX PADS
6.0000 | MEDICATED_PAD | Freq: Every day | CUTANEOUS | Status: DC
  Administered 2023-03-27 – 2023-03-30 (×4): 6 via TOPICAL

## 2023-03-26 MED ORDER — INSULIN NPH (HUMAN) (ISOPHANE) 100 UNIT/ML ~~LOC~~ SUSP: 20.0000 [IU] | Freq: Two times a day (BID) | SUBCUTANEOUS | Status: DC

## 2023-03-26 MED ORDER — INSULIN ASPART 100 UNIT/ML IJ SOLN: 10.0000 [IU] | Freq: Three times a day (TID) | INTRAMUSCULAR | Status: DC

## 2023-03-26 MED ORDER — MUPIROCIN 2 % EX OINT
TOPICAL_OINTMENT | Freq: Two times a day (BID) | CUTANEOUS | Status: DC
  Administered 2023-03-27 – 2023-03-29 (×3): 1 via NASAL
  Filled 2023-03-26: qty 22

## 2023-03-26 MED ORDER — INSULIN ASPART 100 UNIT/ML IJ SOLN
24.0000 [IU] | Freq: Once | INTRAMUSCULAR | Status: AC
  Administered 2023-03-26: 24 [IU] via SUBCUTANEOUS

## 2023-03-26 MED ORDER — INSULIN ASPART 100 UNIT/ML IJ SOLN: 0.0000 [IU] | Freq: Three times a day (TID) | INTRAMUSCULAR | Status: DC

## 2023-03-26 MED ORDER — SODIUM CHLORIDE 0.9% FLUSH: 10.0000 mL | INTRAVENOUS | Status: DC | PRN

## 2023-03-26 NOTE — Progress Notes (Signed)
 Subjective: 1 Day Post-Op Procedure(s) (LRB): INCISION AND DRAINAGE LEFT KNEE, SYNOVECTOMY,  POLY-LINER EXCHANGE (Left) Patient reports pain as severe.  His wife is at the bedside.  He understands that we are concerned about a chronic infection in his left knee revision arthroplasty.  We are are awaiting Gram stain and culture results from the fluid and soft tissue from his knee as well as pathology to assess the amount of inflammatory cells per high-power field.  Currently he is on vancomycin  per consult with the pharmacy.  We will continue him on the vancomycin .  He has had a history of MRSA in the past.  He will definitely need a PICC line and at least 6 weeks of IV antibiotics after discharge.  Objective: Vital signs in last 24 hours: Temp:  [97.4 F (36.3 C)-98.4 F (36.9 C)] 97.8 F (36.6 C) (01/04 0937) Pulse Rate:  [48-69] 63 (01/04 0937) Resp:  [7-18] 17 (01/04 0937) BP: (107-157)/(55-82) 136/56 (01/04 0937) SpO2:  [94 %-100 %] 96 % (01/04 0937) Weight:  [93 kg] 93 kg (01/03 1353)  Intake/Output from previous day: 01/03 0701 - 01/04 0700 In: 1410 [P.O.:360; I.V.:800; IV Piggyback:250] Out: 550 [Urine:500; Blood:50] Intake/Output this shift: Total I/O In: 360 [P.O.:360] Out: 225 [Urine:225]  Recent Labs    03/26/23 0355  HGB 11.5*   Recent Labs    03/26/23 0355  WBC 7.3  RBC 3.99*  HCT 34.5*  PLT 203   Recent Labs    03/26/23 0355  NA 138  K 4.9  CL 106  CO2 24  BUN 23  CREATININE 0.91  GLUCOSE 167*  CALCIUM  8.9   No results for input(s): LABPT, INR in the last 72 hours.  Sensation intact distally Dorsiflexion/Plantar flexion intact Incision: scant drainage Compartment soft   Assessment/Plan: 1 Day Post-Op Procedure(s) (LRB): INCISION AND DRAINAGE LEFT KNEE, SYNOVECTOMY,  POLY-LINER EXCHANGE (Left) Up with therapy Awaiting Gram stain and culture results Will need a PICC line placed for 6 weeks of IV antibiotics.     [name] 03/26/2023, 10:39 AM

## 2023-03-26 NOTE — Progress Notes (Signed)
 Physical Therapy Treatment Patient Details Name: [name] MRN: [id number] DOB: 1955/04/15 Today's Date: 03/26/2023   History of Present Illness Pt is a 68 year old male s/p open synovectomy with incision and drainage left knee and polyliner exchange left knee on 03/25/23.  PMHx: multiple left knee surgeries, DM, HTN, HLD, left LE lymphedema from prostate cancer hx    PT Comments  Pt reported bleeding earlier with standing at sink however RN reinforced dressing.  Pt ambulated in hallway and no bleeding observed on outer dressing.  Pt assisted back to bed to elevate LEs.  Pt typically wears compression garment for L LE due to lymphedema so placed bed in more trendelenberg position with HOB raised to comfort and feet still above heart (RN notified of bed positioning).  Pt agreeable to all rails up for safety and to call for assist.  Pt also encouraged to perform ankle pumps.   If plan is discharge home, recommend the following:     Can travel by private vehicle        Equipment Recommendations  None recommended by PT    Recommendations for Other Services       Precautions / Restrictions Precautions Precautions: Fall;Knee;Other (comment) Precaution Comments: no aggressive knee flexion Restrictions Weight Bearing Restrictions Per Provider Order: No Other Position/Activity Restrictions: WBAT     Mobility  Bed Mobility Overal bed mobility: Needs Assistance Bed Mobility: Sit to Supine     Supine to sit: Supervision, HOB elevated Sit to supine: Supervision   General bed mobility comments: pt self assists L LE with R LE    Transfers Overall transfer level: Needs assistance Equipment used: Rolling walker (2 wheels) Transfers: Sit to/from Stand Sit to Stand: Contact guard assist, Supervision           General transfer comment: verbal cues for positioning and safety (waiting for RW)    Ambulation/Gait Ambulation/Gait assistance: Contact guard assist,  Supervision Gait Distance (Feet): 240 Feet Assistive device: Rolling walker (2 wheels) Gait Pattern/deviations: Step-through pattern, Decreased stance time - left, Antalgic       General Gait Details: verbal cues for initial sequence, steady with RW, no break through bleeding observed on dressing   Stairs Stairs: Yes Stairs assistance: Contact guard assist Stair Management: Step to pattern, Forwards, One rail Right Number of Stairs: 4 General stair comments: verbal cues for sequence and safety; performed once with 2 rails and then again with only right rail (as set up at home)   Wheelchair Mobility     Tilt Bed    Modified Rankin (Stroke Patients Only)       Balance                                            Cognition Arousal: Alert Behavior During Therapy: WFL for tasks assessed/performed Overall Cognitive Status: Within Functional Limits for tasks assessed                                          Exercises     General Comments        Pertinent Vitals/Pain Pain Assessment Pain Assessment: 0-10 Pain Score: 5  Pain Location: left knee Pain Descriptors / Indicators: Aching, Sore Pain Intervention(s): Repositioned, Monitored during session    Home Living  Family/patient expects to be discharged to:: Private residence Living Arrangements: Spouse/significant other Available Help at Discharge: Family Type of Home: House Home Access: Stairs to enter Entrance Stairs-Rails: Right Entrance Stairs-Number of Steps: 3   Home Layout: One level Home Equipment: Agricultural Consultant (2 wheels)      Prior Function            PT Goals (current goals can now be found in the care plan section) Acute Rehab PT Goals PT Goal Formulation: With patient Time For Goal Achievement: 04/02/23 Potential to Achieve Goals: Good Progress towards PT goals: Progressing toward goals    Frequency    7X/week      PT Plan      Co-evaluation               AM-PAC PT 6 Clicks Mobility   Outcome Measure  Help needed turning from your back to your side while in a flat bed without using bedrails?: A Little Help needed moving from lying on your back to sitting on the side of a flat bed without using bedrails?: A Little Help needed moving to and from a bed to a chair (including a wheelchair)?: A Little Help needed standing up from a chair using your arms (e.g., wheelchair or bedside chair)?: A Little Help needed to walk in hospital room?: A Little Help needed climbing 3-5 steps with a railing? : A Little 6 Click Score: 18    End of Session Equipment Utilized During Treatment: Gait belt Activity Tolerance: Patient tolerated treatment well Patient left: in bed;with call bell/phone within reach;with family/visitor present Nurse Communication: Mobility status PT Visit Diagnosis: Difficulty in walking, not elsewhere classified (R26.2)     Time: 8571-8559 PT Time Calculation (min) (ACUTE ONLY): 12 min  Charges:    $Gait Training: 8-22 mins PT General Charges $$ ACUTE PT VISIT: 1 Visit                     Tari KLEIN, DPT Physical Therapist Acute Rehabilitation Services Office: [phone]    Tari CROME [place] 03/26/2023, 3:38 PM

## 2023-03-26 NOTE — Anesthesia Postprocedure Evaluation (Signed)
 Anesthesia Post Note  Patient: [name]  Procedure(s) Performed: INCISION AND DRAINAGE LEFT KNEE, SYNOVECTOMY,  POLY-LINER EXCHANGE (Left: Knee)     Patient location during evaluation: PACU Anesthesia Type: Regional, Spinal and MAC Level of consciousness: oriented and awake and alert Pain management: pain level controlled Vital Signs Assessment: post-procedure vital signs reviewed and stable Respiratory status: spontaneous breathing, respiratory function stable and patient connected to nasal cannula oxygen Cardiovascular status: blood pressure returned to baseline and stable Postop Assessment: no headache, no backache and no apparent nausea or vomiting Anesthetic complications: no   No notable events documented.  Last Vitals:  Vitals:   03/26/23 0400 03/26/23 0937  BP: 122/66 (!) 136/56  Pulse: 64 63  Resp: 18 17  Temp: 36.7 C 36.6 C  SpO2: 97% 96%    Last Pain:  Vitals:   03/26/23 0824  TempSrc:   PainSc: 5                  Tarshia Kot

## 2023-03-26 NOTE — Progress Notes (Signed)
 Peripherally Inserted Central Catheter Placement  The IV Nurse has discussed with the patient and/or persons authorized to consent for the patient, the purpose of this procedure and the potential benefits and risks involved with this procedure.  The benefits include less needle sticks, lab draws from the catheter, and the patient may be discharged home with the catheter. Risks include, but not limited to, infection, bleeding, blood clot (thrombus formation), and puncture of an artery; nerve damage and irregular heartbeat and possibility to perform a PICC exchange if needed/ordered by physician.  Alternatives to this procedure were also discussed.  Bard Power PICC patient education guide, fact sheet on infection prevention and patient information card has been provided to patient /or left at bedside.    PICC Placement Documentation  PICC Single Lumen 03/26/23 Right Basilic 39 cm 0 cm (Active)  Indication for Insertion or Continuance of Line Home intravenous therapies (PICC only) 03/26/23 1827  Exposed Catheter (cm) 0 cm 03/26/23 1827  Site Assessment Clean, Dry, Intact 03/26/23 1827  Line Status Saline locked;Blood return noted 03/26/23 1827  Dressing Type Transparent;Securing device 03/26/23 1827  Dressing Status Antimicrobial disc in place;Clean, Dry, Intact 03/26/23 1827  Line Care Connections checked and tightened 03/26/23 1827  Line Adjustment (NICU/IV Team Only) No 03/26/23 1827  Dressing Intervention New dressing;Adhesive placed at insertion site (IV team only) 03/26/23 1827  Dressing Change Due 04/02/23 03/26/23 1827       [name] 03/26/2023, 6:27 PM

## 2023-03-26 NOTE — Evaluation (Signed)
 Physical Therapy Evaluation Patient Details Name: [name] MRN: [id number] DOB: 1955-04-20 Today's Date: 03/26/2023  History of Present Illness  Pt is a 68 year old male s/p open synovectomy with incision and drainage left knee and polyliner exchange left knee on 03/25/23.  PMHx: multiple left knee surgeries, DM, HTN, HLD, left LE lymphedema from prostate cancer hx  Clinical Impression  Patient is s/p above surgery resulting in functional limitations due to the deficits listed below (see PT Problem List).  Patient will benefit from acute skilled PT to increase their independence and safety with mobility to facilitate discharge.  Pt ambulated in hallway, practiced safe stair technique and initiated performing LE exercises.  Pt anticipates d/c home tomorrow since he will need PICC line.  Will continue to safely assist pt with mobility and exercises as he remains in hospital.         If plan is discharge home, recommend the following:     Can travel by private vehicle        Equipment Recommendations None recommended by PT  Recommendations for Other Services       Functional Status Assessment Patient has had a recent decline in their functional status and demonstrates the ability to make significant improvements in function in a reasonable and predictable amount of time.     Precautions / Restrictions Precautions Precautions: Fall;Knee;Other (comment) Precaution Comments: no aggressive knee flexion Restrictions Weight Bearing Restrictions Per Provider Order: No Other Position/Activity Restrictions: WBAT      Mobility  Bed Mobility Overal bed mobility: Needs Assistance Bed Mobility: Supine to Sit     Supine to sit: Supervision, HOB elevated          Transfers Overall transfer level: Needs assistance Equipment used: Rolling walker (2 wheels) Transfers: Sit to/from Stand Sit to Stand: Contact guard assist           General transfer comment: verbal cues for  positioning    Ambulation/Gait Ambulation/Gait assistance: Contact guard assist Gait Distance (Feet): 200 Feet Assistive device: Rolling walker (2 wheels) Gait Pattern/deviations: Step-to pattern, Step-through pattern, Decreased stance time - left, Antalgic       General Gait Details: verbal cues for initial sequence, steady with RW  Stairs Stairs: Yes Stairs assistance: Contact guard assist Stair Management: Step to pattern, Forwards, One rail Right Number of Stairs: 4 General stair comments: verbal cues for sequence and safety; performed once with 2 rails and then again with only right rail (as set up at home)  Wheelchair Mobility     Tilt Bed    Modified Rankin (Stroke Patients Only)       Balance                                             Pertinent Vitals/Pain Pain Assessment Pain Assessment: 0-10 Pain Score: 4  Pain Location: left knee Pain Descriptors / Indicators: Aching, Sore Pain Intervention(s): Monitored during session, Repositioned    Home Living Family/patient expects to be discharged to:: Private residence Living Arrangements: Spouse/significant other Available Help at Discharge: Family Type of Home: House Home Access: Stairs to enter Entrance Stairs-Rails: Right Entrance Stairs-Number of Steps: 3   Home Layout: One level Home Equipment: Agricultural Consultant (2 wheels)      Prior Function Prior Level of Function : Independent/Modified Independent  Extremity/Trunk Assessment        Lower Extremity Assessment Lower Extremity Assessment: LLE deficits/detail LLE Deficits / Details: able to perform SLR, no agressive knee flexion per orders but tolerated approx 20*       Communication   Communication Communication: No apparent difficulties  Cognition Arousal: Alert Behavior During Therapy: WFL for tasks assessed/performed Overall Cognitive Status: Within Functional Limits for tasks assessed                                           General Comments      Exercises Total Joint Exercises Ankle Circles/Pumps: AROM, Both, 10 reps Quad Sets: AROM, Both, 10 reps Short Arc Quad: AROM, Left, 10 reps   Assessment/Plan    PT Assessment Patient needs continued PT services  PT Problem List Decreased mobility;Decreased range of motion;Decreased strength;Pain;Decreased activity tolerance;Decreased knowledge of precautions       PT Treatment Interventions Stair training;Gait training;DME instruction;Balance training;Functional mobility training;Therapeutic activities;Therapeutic exercise;Patient/family education    PT Goals (Current goals can be found in the Care Plan section)  Acute Rehab PT Goals PT Goal Formulation: With patient Time For Goal Achievement: 04/02/23 Potential to Achieve Goals: Good    Frequency 7X/week     Co-evaluation               AM-PAC PT 6 Clicks Mobility  Outcome Measure Help needed turning from your back to your side while in a flat bed without using bedrails?: A Little Help needed moving from lying on your back to sitting on the side of a flat bed without using bedrails?: A Little Help needed moving to and from a bed to a chair (including a wheelchair)?: A Little Help needed standing up from a chair using your arms (e.g., wheelchair or bedside chair)?: A Little Help needed to walk in hospital room?: A Little Help needed climbing 3-5 steps with a railing? : A Little 6 Click Score: 18    End of Session Equipment Utilized During Treatment: Gait belt Activity Tolerance: Patient tolerated treatment well Patient left: in chair;with call bell/phone within reach;with family/visitor present Nurse Communication: Mobility status PT Visit Diagnosis: Difficulty in walking, not elsewhere classified (R26.2)    Time: 9050-8987 PT Time Calculation (min) (ACUTE ONLY): 23 min   Charges:   PT Evaluation $PT Eval Low Complexity: 1  Low PT Treatments $Gait Training: 8-22 mins PT General Charges $$ ACUTE PT VISIT: 1 Visit       Tari KLEIN, DPT Physical Therapist Acute Rehabilitation Services Office: [phone]   Tari CROME [place] 03/26/2023, 12:21 PM

## 2023-03-27 LAB — GLUCOSE, CAPILLARY
Glucose-Capillary: 81 mg/dL (ref 70–99)
Glucose-Capillary: 99 mg/dL (ref 70–99)
Glucose-Capillary: 117 mg/dL — ABNORMAL HIGH (ref 70–99)
Glucose-Capillary: 194 mg/dL — ABNORMAL HIGH (ref 70–99)

## 2023-03-27 LAB — CBC
HCT: 32.9 % — ABNORMAL LOW (ref 39.0–52.0)
Hemoglobin: 10.9 g/dL — ABNORMAL LOW (ref 13.0–17.0)
MCH: 29.3 pg (ref 26.0–34.0)
MCHC: 33.1 g/dL (ref 30.0–36.0)
MCV: 88.4 fL (ref 80.0–100.0)
Platelets: 207 10*3/uL (ref 150–400)
RBC: 3.72 MIL/uL — ABNORMAL LOW (ref 4.22–5.81)
RDW: 14.5 % (ref 11.5–15.5)
WBC: 8.5 10*3/uL (ref 4.0–10.5)
nRBC: 0 % (ref 0.0–0.2)

## 2023-03-27 LAB — BASIC METABOLIC PANEL
Anion gap: 8 (ref 5–15)
BUN: 25 mg/dL — ABNORMAL HIGH (ref 8–23)
CO2: 26 mmol/L (ref 22–32)
Calcium: 8.7 mg/dL — ABNORMAL LOW (ref 8.9–10.3)
Chloride: 101 mmol/L (ref 98–111)
Creatinine, Ser: 0.74 mg/dL (ref 0.61–1.24)
GFR, Estimated: >60 mL/min (ref >60)
Glucose, Bld: 151 mg/dL — ABNORMAL HIGH (ref 70–99)
Potassium: 3.9 mmol/L (ref 3.5–5.1)
Sodium: 135 mmol/L (ref 135–145)

## 2023-03-27 LAB — C-REACTIVE PROTEIN: CRP: 2.9 mg/dL — ABNORMAL HIGH (ref <1.0)

## 2023-03-27 LAB — SEDIMENTATION RATE: Sed Rate: 35 mm/h — ABNORMAL HIGH (ref 0–16)

## 2023-03-27 NOTE — TOC Initial Note (Addendum)
 Transition of Care [facility]) - Initial/Assessment Note    Patient Details  Name: [name] MRN: [id number] Date of Birth: 09-12-1955  Transition of Care [facility]) CM/SW Contact:    Dalila Camellia SAUNDERS, LCSW Phone Number: 03/27/2023, 3:34 PM  Clinical Narrative:                  Patient is a 68 year old male who is alert and oriented x4.  Patient lives in a house with his wife.  Per conversation with patient he has had home health before, and has had home IV antibiotics in the past.  Patient's home health was prearranged through [facility], [facility] will be assisting with patient's home IV antibiotics.  Patient stated that he is feeling better and is looking forward to going home once he is medically ready for discharge.  Patient stated that he does not have any problems getting to his doctor appointments or paying for his medications.  Patient has insurance and a PCP.  Patient stated he does not need any other DME at home.  TOC to continue to follow patient's progress throughout discharge planning.  CSW confirmed with patient that he is aware of [facility] for [facility] and [facility] for home IV antibiotics.  CSW confirmed with Lebron Music from [place] [phone] that she will be able to accept patient.  Per patient he has already spoken to [place], and she will be here tomorrow at 1pm to complete teaching for IV antibiotics.  Expected Discharge Plan: Home w Home Health Services Barriers to Discharge: Continued Medical Work up   Patient Goals and CMS Choice Patient states their goals for this hospitalization and ongoing recovery are:: To return home with home IV antibiotics and home health PT and RN. CMS Medicare.gov Compare Post Acute Care list provided to:: Patient Choice offered to / list presented to : Patient [place] ownership interest in [facility].provided to:: Patient    Expected Discharge Plan and Services     Post Acute Care Choice: Home Health Living arrangements for the  past 2 months: Single Family Home                 DME Arranged: IV pump/equipment DME Agency: Other - Comment [employer]ar[employer] Manager) Date DME Agency Contacted: 03/26/23 Time DME Agency Contacted: [phone] Representative spoke with at DME Agency: Margie HH Arranged: PT, RN HH Agency: [facility] Date [facility] Agency Contacted: 03/26/23 Time HH Agency Contacted: [phone] Representative spoke with at [facility] Agency: Arna  Prior Living Arrangements/Services Living arrangements for the past 2 months: Single Family Home Lives with:: Spouse Patient language and need for interpreter reviewed:: Yes Do you feel safe going back to the place where you live?: Yes      Need for Family Participation in Patient Care: No (Comment) Care giver support system in place?: No (comment)   Criminal Activity/Legal Involvement Pertinent to Current Situation/Hospitalization: No - Comment as needed  Activities of Daily Living   ADL Screening (condition at time of admission) Independently performs ADLs?: Yes (appropriate for developmental age) Is the patient deaf or have difficulty hearing?: No Does the patient have difficulty seeing, even when wearing glasses/contacts?: No Does the patient have difficulty concentrating, remembering, or making decisions?: No  Permission Sought/Granted Permission sought to share information with : Case Manager, Family Supports, Magazine Features Editor Permission granted to share information with : Yes, Verbal Permission Granted, Yes, Release of Information Signed  Share Information with NAMEEbbie, [place] [phone]  [phone]  Permission granted to  share info w AGENCY: HH agency        Emotional Assessment Appearance:: Appears stated age Attitude/Demeanor/Rapport: Engaged Affect (typically observed): Calm, Hopeful, Stable Orientation: : Oriented to Self, Oriented to Place, Oriented to  Time, Oriented to Situation Alcohol / Substance Use: Not Applicable Psych Involvement:  No (comment)  Admission diagnosis:  Infection of total left knee replacement (HCC) [T84.54XA] Patient Active Problem List   Diagnosis Date Noted   Chronic infection of left knee (HCC) 03/24/2023   Status post revision of total replacement of left knee 08/27/2021   Status post revision of total knee replacement, left 08/14/2021   PICC (peripherally inserted central catheter) in place 06/09/2021   Vaccine counseling 05/04/2021   Skin lesion 01/01/2021   Type 2 diabetes mellitus with other specified complication (HCC) 01/01/2021   Nausea and vomiting 09/11/2020   MSSA (methicillin susceptible Staphylococcus aureus) infection 09/03/2020   Effusion, left knee 08/14/2020   Infection of total left knee replacement (HCC) 08/14/2020   Status post total left knee replacement 03/21/2019   S/P carpal tunnel release right 02/22/19 03/08/2019   Carpal tunnel syndrome of right wrist    HTN (hypertension) 10/27/2018   Prostate cancer (HCC) 05/30/2017   Malignant neoplasm of prostate (HCC) 04/28/2017   Chronic pain of both knees 02/21/2017   Unilateral primary osteoarthritis, left knee 02/21/2017   Unilateral primary osteoarthritis, right knee 02/21/2017   PCP:  Janey Santos, MD Pharmacy:   [facility] [place], [place] - [address] [place] [address] Phone: [phone] Fax: [phone]     Social Drivers of Health (SDOH) Social History: SDOH Screenings   Food Insecurity: No Food Insecurity (03/25/2023)  Housing: Low Risk  (03/25/2023)  Transportation Needs: No Transportation Needs (03/25/2023)  Utilities: Not At Risk (03/25/2023)  Depression (PHQ2-9): Low Risk  (06/15/2021)  Financial Resource Strain: Low Risk  (11/12/2021)  Social Connections: Unknown (03/26/2023)  Stress: No Stress Concern Present (09/01/2020)  Tobacco Use: Low Risk  (03/25/2023)   SDOH Interventions:     Readmission Risk Interventions     No data to display

## 2023-03-27 NOTE — Progress Notes (Signed)
 Physical Therapy Treatment Patient Details Name: [name] MRN: [id number] DOB: [birth year] Today's Date: 03/27/2023   History of Present Illness Pt is a 68 year old male s/p open synovectomy with incision and drainage left knee and polyliner exchange left knee on 03/25/23.  PMHx: multiple left knee surgeries, DM, HTN, HLD, left LE lymphedema from prostate cancer hx    PT Comments  Pt ambulated in hallway and performed LE exercises.  Pt aware of precaution for no aggressive knee flexion and gentle bending to left knee only (which was also written on his HEP handout).  Pt anticipates d/c pending cultures and home care for PICC line.  If pt remains overnight, will return and ambulate and practice steps again however pt is capable from PT standpoint to d/c home.   If plan is discharge home, recommend the following:     Can travel by private vehicle        Equipment Recommendations  None recommended by PT    Recommendations for Other Services       Precautions / Restrictions Precautions Precautions: Fall;Knee;Other (comment) Precaution Comments: no aggressive knee flexion Restrictions Other Position/Activity Restrictions: WBAT     Mobility  Bed Mobility Overal bed mobility: Needs Assistance Bed Mobility: Supine to Sit     Supine to sit: Supervision, HOB elevated Sit to supine: Supervision   General bed mobility comments: pt self assists L LE with R LE    Transfers Overall transfer level: Needs assistance Equipment used: Rolling walker (2 wheels) Transfers: Sit to/from Stand Sit to Stand: Supervision           General transfer comment: verbal cues for positioning and safety (waiting for RW)    Ambulation/Gait Ambulation/Gait assistance: Contact guard assist, Supervision Gait Distance (Feet): 400 Feet Assistive device: Rolling walker (2 wheels) Gait Pattern/deviations: Step-through pattern, Decreased stance time - left, Antalgic       General Gait  Details: steady with RW, no break through bleeding observed on dressing; pt reports feels good to ambulate   Stairs             Wheelchair Mobility     Tilt Bed    Modified Rankin (Stroke Patients Only)       Balance                                            Cognition Arousal: Alert Behavior During Therapy: WFL for tasks assessed/performed Overall Cognitive Status: Within Functional Limits for tasks assessed                                          Exercises Total Joint Exercises Ankle Circles/Pumps: AROM, Both, 10 reps Quad Sets: AROM, Both, 10 reps Heel Slides: AAROM, Left, 10 reps, Limitations Heel Slides Limitations: gentle only due to precaution for no agressive flexion Hip ABduction/ADduction: AAROM, Left, 10 reps Straight Leg Raises: AAROM, Left, 10 reps    General Comments        Pertinent Vitals/Pain Pain Assessment Pain Assessment: 0-10 Pain Score: 4  Pain Location: left knee Pain Descriptors / Indicators: Aching, Sore Pain Intervention(s): Monitored during session, Repositioned    Home Living  Prior Function            PT Goals (current goals can now be found in the care plan section) Progress towards PT goals: Progressing toward goals    Frequency    7X/week      PT Plan      Co-evaluation              AM-PAC PT 6 Clicks Mobility   Outcome Measure  Help needed turning from your back to your side while in a flat bed without using bedrails?: A Little Help needed moving from lying on your back to sitting on the side of a flat bed without using bedrails?: A Little Help needed moving to and from a bed to a chair (including a wheelchair)?: A Little Help needed standing up from a chair using your arms (e.g., wheelchair or bedside chair)?: A Little Help needed to walk in hospital room?: A Little Help needed climbing 3-5 steps with a railing? : A  Little 6 Click Score: 18    End of Session Equipment Utilized During Treatment: Gait belt Activity Tolerance: Patient tolerated treatment well Patient left: in chair;with call bell/phone within reach;with family/visitor present Nurse Communication: Mobility status PT Visit Diagnosis: Difficulty in walking, not elsewhere classified (R26.2)     Time: 8943-8877 PT Time Calculation (min) (ACUTE ONLY): 26 min  Charges:    $Gait Training: 8-22 mins $Therapeutic Exercise: 8-22 mins PT General Charges $$ ACUTE PT VISIT: 1 Visit                    Tari KLEIN, DPT Physical Therapist Acute Rehabilitation Services Office: [phone]    Tari CROME [place] 03/27/2023, 4:56 PM

## 2023-03-27 NOTE — Plan of Care (Signed)
  Problem: Education: Goal: Knowledge of General Education information will improve Description: Including pain rating scale, medication(s)/side effects and non-pharmacologic comfort measures Outcome: Progressing   Problem: Activity: Goal: Risk for activity intolerance will decrease Outcome: Progressing   Problem: Pain Management: Goal: General experience of comfort will improve Outcome: Progressing

## 2023-03-27 NOTE — Progress Notes (Signed)
 Patient ID: [name], male   DOB: 08/18/1955, 68 y.o.   MRN: [id number] The patient is awake and alert.  His vital signs are stable.  A PICC line has been placed.  Unfortunately, the Gram stain and culture still showed nothing for us  to follow in the computer.  I believe here at [facility] that those types of labs are sent over the [facility].  We may not know anything until tomorrow.  He does have a compressive dressing around his left knee due to bloody drainage.  He is on vancomycin .  I will change the dressing again tomorrow.  I will order a new set of labs today as well.

## 2023-03-28 ENCOUNTER — Encounter (HOSPITAL_COMMUNITY): Payer: Self-pay | Admitting: Orthopaedic Surgery

## 2023-03-28 ENCOUNTER — Ambulatory Visit: Payer: PPO | Admitting: Orthopaedic Surgery

## 2023-03-28 LAB — GLUCOSE, CAPILLARY
Glucose-Capillary: 84 mg/dL (ref 70–99)
Glucose-Capillary: 77 mg/dL (ref 70–99)
Glucose-Capillary: 111 mg/dL — ABNORMAL HIGH (ref 70–99)
Glucose-Capillary: 113 mg/dL — ABNORMAL HIGH (ref 70–99)

## 2023-03-28 NOTE — Progress Notes (Addendum)
 Pharmacy Antibiotic Note  [name] is a 68 y.o. male admitted on 03/25/2023 with recurrent L-knee PJI with intra-op cultures that grew MRSE.  Pharmacy has been consulted for Vancomycin  dosing.  Attempted levels prior to discharge this morning however the dose was given before the level could be drawn. It is scheduled for the home health team to check is first trough outpatient tomorrow, 1/9.   The current doses of Vancomycin  1250 mg IV every 12 hours it estimated to achieve a target AUC of 474, VT 13.4 - will continue the current dosing for now  Rifampin  is being added this morning for synergy with PJI and staph species. The patient's Rivaroxaban  dose will need to be increased to 20 mg qdws while he is on the concurrent rifampin . If the rifampin  is held due to intolerance, the rivaroxaban  dose will need to be reduced back down.   Plan: - Continue Vancomycin  1250 mg IV every 12 hours - OPAT orders/note to follow - Adding Rifampin  300 mg po bid - Increase Rivaroxaban  to 20 mg daily with supper while on concurrent Rifampin    Height: 5' 9 (175.3 cm) Weight: 93 kg (205 lb) IBW/kg (Calculated) : 70.7  Temp (24hrs), Avg:98 F (36.7 C), Min:97.7 F (36.5 C), Max:98.3 F (36.8 C)  Recent Labs  Lab 03/26/23 0355 03/27/23 1050  WBC 7.3 8.5  CREATININE 0.91 0.74    Estimated Creatinine Clearance: 100.9 mL/min (by C-G formula based on SCr of 0.74 mg/dL).    No Known Allergies  Antimicrobials this admission: Vancomycin  1/3 >> Rocephin  1/7 x 1  Dose adjustments this admission:   Microbiology results: 1/3 L-knee synovial fluid >> MRSE 1/3 L-knee tissue cultures >> rare staph epi  Thank you for allowing pharmacy to be a part of this patient's care.  Almarie Lunger, PharmD, BCPS, BCIDP Infectious Diseases Clinical Pharmacist 03/30/2023 10:29 AM   **Pharmacist phone directory can now be found on [email or web address] ([email or web address]).  Listed under [facility].

## 2023-03-28 NOTE — Plan of Care (Signed)
   Problem: Coping: Goal: Level of anxiety will decrease Outcome: Progressing   Problem: Pain Management: Goal: General experience of comfort will improve Outcome: Progressing   Problem: Safety: Goal: Ability to remain free from injury will improve Outcome: Progressing

## 2023-03-28 NOTE — Progress Notes (Signed)
 Patient ID: [name], male   DOB: 1955/04/02, 68 y.o.   MRN: [id number] The patient is awake and alert.  He appears comfortable.  I was able to change his left knee dressing and there is no redness with his left knee and the incision is clean and dry today.  I did place a new dressing.  Unfortunately, the patient's soft tissue and fluid cultures from his left knee were left in her refrigerator at the lab here for the weekend.  They were able to at least get a stat Gram stain from the samples which were negative for any organisms.  We need to see what the cultures show over the next day to 2 days before discharging the patient.  My biggest concern was the fact that we did encounter fluid that was suspicious for infection.  I did explain this to the patient and he understands this as well.  If the cultures remain negative, I will still want him on IV antibiotics for a minimum of 3 weeks.  He does have a PICC line in place.  Once dosing is decided on in terms of his vancomycin , this can be set up through home health pharmacy for labs and appropriate dosing over the next several weeks.

## 2023-03-28 NOTE — Consult Note (Addendum)
 [facility]                                                                                        Patient Identification: Patient Name: [name] MRN: [id number] Admit Date: 03/25/2023 12:57 PM Today's Date: 03/28/2023 Reason for consult: Recurrent/chronic PJI  Requesting provider: Dr Vernetta  Principal Problem:   Chronic infection of left knee [facility]) Active Problems:   Infection of total left knee replacement (HCC)   Antibiotics:  Vancomycin  1/3  Lines/Hardware: left knee TKR  Assessment # Recurrent Left knee PJI ( MSSA, MRSA, Staph epidermidis) - 1/3 S/p open synovecytomy with I and D and polyliner exchange. Findings: Gross purulence involving the left knee tibial revision component, Gram stain and cultures pending of fluid and soft tissue as well as tissue sent to pathology to evaluate inflammatory cells per high-power field. OR cx staph epidermidis - Of note, he was able to tolerate Rifampin  partially when he had PJI in 2022.    # DM2 - A1c 8.2 - discussed optimal BG control   Recommendations  - continue Vancomycin , pharmacy to dose, will DC ceftriaxone  which was started earlier today prior to cultures were reported as Staph epidermidis. Fu sensi's for OPAT - Already has a PICC in place. Plan for 6 weeks IV abtx then PO suppression thereafter, possibly indefinitely due to number of recurrences - Monitor CBC , BMP and Vancomycin  trough, ESR and CRP weekly  - Following cultures for OPAT - d/w Dr Vernetta  Rest of the management as per the primary team. Please call with questions or concerns.  Thank you for the consult  __________________________________________________________________________________________________________ HPI and Hospital Course: 68 Y O male with HTN, HLD, GERD, DM, Prostate ca, Left TKA in 02/2019 complicated with PJI s/p DAIR in 07/2020 with cx + for MSSA  treated with IV cefazolin  for 6 weeks ( + Rifampin  given only partially due to nausea) followed by PO cefadroxil , further complicated with another Left knee PJI s/p excision arthroplasty with removal of hardware and abtx spacer placement 05/15/21 Cx MRSA, s/p IV cefazolin > Vancomycin  after MRSA isolation on 05/21/21> daptomycin , EOT 07/02/21, followed by removal of antibiotic spacer, and 2 stage revision who was admitted due to recurrent effusions and swelling of left knee for concerns for PJI. He had an aspiration done of the left knee 12/16/21 ( 5K wbc, neutrophilic, cx NG and 01/13/23 less than 3K WBC, neutrophilic,)  Patient reports he was doing well for few months after his last surgery in 07/2021 until ? Oct 2023 when he started having recurrent swelling. He started following with Orthopedics and was started on doxycycline  for sometime but reports he was taken off of doxycycline  2 weeks prior to admission for getting reliable cultures.   ROS: General- Denies fever, chills, loss of appetite and loss of weight HEENT - Denies headache, blurry vision, neck pain, sinus pain Chest - Denies any chest pain, SOB or cough CVS- Denies any dizziness/lightheadedness, syncopal attacks, palpitations Abdomen- Denies any nausea, vomiting, abdominal pain, hematochezia and diarrhea Neuro - Denies any weakness, numbness, tingling sensation Psych - Denies any changes in mood irritability  or depressive symptoms GU- Denies any burning, dysuria, hematuria or increased frequency of urination Skin - denies any rashes/lesions MSK - soreness in the left knee   Past Medical History:  Diagnosis Date   Arthritis    Diabetes mellitus without complication (HCC) 2019   Diabetes Type II   GERD (gastroesophageal reflux disease)    diet controlled   HTN (hypertension) 10/27/2018   Hyperlipidemia    Internal hemorrhoids 2014   noted on colonoscopy   MSSA (methicillin susceptible Staphylococcus aureus) infection 09/03/2020    Osteoarthritis of both knees    Prostate cancer (HCC)    prostate   Sigmoid diverticulosis 2014   Mild, noted on colonoscopy   Skin lesion 01/01/2021   Type 2 diabetes mellitus with other specified complication (HCC) 01/01/2021   Vaccine counseling 05/04/2021   Past Surgical History:  Procedure Laterality Date   CARPAL TUNNEL RELEASE Right 02/22/2019   Procedure: CARPAL TUNNEL RELEASE;  Surgeon: Margrette Taft BRAVO, MD;  Location: [facility];  Service: Orthopedics;  Laterality: Right;   COLONOSCOPY N/A 09/13/2012   Procedure: COLONOSCOPY;  Surgeon: Margo LITTIE Haddock, MD;  Location: [facility];  Service: Endoscopy;  Laterality: N/A;  9:30 AM   CYSTOSCOPY WITH URETHRAL DILATATION N/A 06/02/2020   Procedure: CYSTOSCOPY WITH URETHRAL DILATATION;  Surgeon: Renda Glance, MD;  Location: [facility];  Service: Urology;  Laterality: N/A;  ONLY NEEDS 30 MIN   EXCISIONAL TOTAL KNEE ARTHROPLASTY WITH ANTIBIOTIC SPACERS Left 05/15/2021   Procedure: LEFT TOTAL KNEE IRRIGATION AND DEBRIDEMENT, EXCISION LEFT TOTAL KNEE ARTHROPLASTY WITH ANTIBIOTIC SPACERS;  Surgeon: Vernetta Lonni GRADE, MD;  Location: [facility];  Service: Orthopedics;  Laterality: Left;   HERNIA REPAIR     20 years ago   I & D KNEE WITH POLY EXCHANGE Left 08/14/2020   Procedure: IRRIGATION AND DEBRIDEMENT LEFT KNEE with synovectomy WITH POLY EXCHANGE;  Surgeon: Vernetta Lonni GRADE, MD;  Location: [facility];  Service: Orthopedics;  Laterality: Left;   KNEE ARTHROSCOPY Left    15 years ago   LYMPHADENECTOMY Bilateral 05/30/2017   Procedure: LYMPHADENECTOMY, PELVIC EXTENDED;  Surgeon: Renda Glance, MD;  Location: [facility];  Service: Urology;  Laterality: Bilateral;   ROBOT ASSISTED LAPAROSCOPIC RADICAL PROSTATECTOMY N/A 05/30/2017   Procedure: XI ROBOTIC ASSISTED LAPAROSCOPIC RADICAL PROSTATECTOMY LEVEL 3;  Surgeon: Renda Glance, MD;  Location: [facility];  Service: Urology;  Laterality: N/A;   SHOULDER ARTHROSCOPY Right    15 years ago   TOTAL KNEE  ARTHROPLASTY Left 03/21/2019   Procedure: LEFT TOTAL KNEE ARTHROPLASTY;  Surgeon: Vernetta Lonni GRADE, MD;  Location: [facility];  Service: Orthopedics;  Laterality: Left;   TOTAL KNEE REVISION Left 08/14/2021   Procedure: REMOVAL OF ANTIBIOTIC SPACER AND LEFT TOTAL KNEE REVISION ARTHROPLASTY;  Surgeon: Vernetta Lonni GRADE, MD;  Location: [facility];  Service: Orthopedics;  Laterality: Left;   Scheduled Meds:  Chlorhexidine  Gluconate Cloth  6 each Topical Daily   docusate sodium   100 mg Oral BID   escitalopram   10 mg Oral Daily   losartan   50 mg Oral Daily   And   hydrochlorothiazide   12.5 mg Oral Daily   insulin  aspart  0-5 Units Subcutaneous QHS   insulin  aspart  10-24 Units Subcutaneous TID WC   insulin  glargine-yfgn  90 Units Subcutaneous Daily   mupirocin  ointment   Nasal BID   pantoprazole   40 mg Oral Daily   pneumococcal 20-valent conjugate vaccine  0.5 mL Intramuscular Tomorrow-1000   rivaroxaban   10 mg Oral Q breakfast  Continuous Infusions:  vancomycin  1,250 mg (03/28/23 0036)   PRN Meds:.acetaminophen , alum & mag hydroxide-simeth, HYDROmorphone  (DILAUDID ) injection, menthol -cetylpyridinium **OR** phenol, methocarbamol  **OR** methocarbamol  (ROBAXIN ) injection, metoCLOPramide  **OR** metoCLOPramide  (REGLAN ) injection, ondansetron  **OR** ondansetron  (ZOFRAN ) IV, oxyCODONE , oxyCODONE , sodium chloride  flush  No Known Allergies  Social History   Socioeconomic History   Marital status: Married    Spouse name: Not on file   Number of children: 1   Years of education: Not on file   Highest education level: Not on file  Occupational History   Occupation: pastor  Tobacco Use   Smoking status: Never   Smokeless tobacco: Never  Vaping Use   Vaping status: Never Used  Substance and Sexual Activity   Alcohol use: No   Drug use: No   Sexual activity: Yes  Other Topics Concern   Not on file  Social History Narrative   Juliene   Married to [employer]   Daughter, Abby  Rory Burkes, RN III, Resides in [place]   Social Drivers of Health   Financial Resource Strain: Low Risk  (11/12/2021)   Overall Financial Resource Strain (CARDIA)    Difficulty of Paying Living Expenses: Not hard at all  Food Insecurity: No Food Insecurity (03/25/2023)   Hunger Vital Sign    Worried About Running Out of Food in the Last Year: Never true    Ran Out of Food in the Last Year: Never true  Transportation Needs: No Transportation Needs (03/25/2023)   PRAPARE - Administrator, Civil Service (Medical): No    Lack of Transportation (Non-Medical): No  Physical Activity: Not on file  Stress: No Stress Concern Present (09/01/2020)   [employer] of Occupational Health - Occupational Stress Questionnaire    Feeling of Stress : Not at all  Social Connections: Unknown (03/26/2023)   Social Connection and Isolation Panel [NHANES]    Frequency of Communication with Friends and Family: Patient declined    Frequency of Social Gatherings with Friends and Family: Patient declined    Attends Religious Services: Not on Insurance Claims Handler of Clubs or Organizations: Patient declined    Attends Banker Meetings: Patient unable to answer    Marital Status: Patient declined  Intimate Partner Violence: Not At Risk (03/25/2023)   Humiliation, Afraid, Rape, and Kick questionnaire    Fear of Current or Ex-Partner: No    Emotionally Abused: No    Physically Abused: No    Sexually Abused: No   Family History  Problem Relation Age of Onset   Cancer Mother        small cell lung    Vitals BP 111/66 (BP Location: Left Arm)   Pulse 65   Temp 98.4 F (36.9 C) (Oral)   Resp 16   Ht 5' 9 (1.753 m)   Wt 93 kg   SpO2 97%   BMI 30.27 kg/m    Physical Exam Constitutional:  adult male sitting in the recliner, not in acute distress    Comments: HEENT wnl   Cardiovascular:     Rate and Rhythm: Normal rate and regular rhythm.     Heart sounds:  s1s2  Pulmonary:     Effort: Pulmonary effort is normal.     Comments: Normal breath sounds   Abdominal:     Palpations: Abdomen is soft.     Tenderness: non distended and non tender   Musculoskeletal:        General: No swelling or tenderness in  other peripheral joints, Lymphedema in left leg, left knee is in a dressed in a bandage C/D/I.   Skin:    Comments: no rashes   Neurological:     General: awake, alert and oriented, following commands   Psychiatric:        Mood and Affect: Mood normal.    Pertinent Microbiology Results for orders placed or performed during the hospital encounter of 03/25/23  Aerobic/Anaerobic Culture w Gram Stain (surgical/deep wound)     Status: None (Preliminary result)   Collection Time: 03/25/23  4:28 PM   Specimen: Synovial, Left Knee; Body Fluid  Result Value Ref Range Status   Specimen Description   Final    KNEE Synov, Knee Performed at [facility], [address] [place], [place] [address]    Special Requests   Final    NONE Performed at [facility], [address] [place], [place] [address]    Gram Stain FEW WBC SEEN NO ORGANISMS SEEN   Final   Culture   Final    CULTURE REINCUBATED FOR BETTER GROWTH Performed at [facility], [address] [place], [place] [address]    Report Status PENDING  Incomplete  Aerobic/Anaerobic Culture w Gram Stain (surgical/deep wound)     Status: None (Preliminary result)   Collection Time: 03/25/23  4:33 PM   Specimen: Soft Tissue, Other  Result Value Ref Range Status   Specimen Description   Final    TISSUE SFT TISS OTH Performed at [facility], [address] [place], [place] [address]    Special Requests   Final    NONE Performed at [facility], [address] [place], [place] [address]    Gram Stain FEW WBC SEEN NO ORGANISMS SEEN   Final   Culture   Final    NO GROWTH < 24 HOURS Performed at  [facility], [address] [place], [place] [address]    Report Status PENDING  Incomplete   Pertinent Lab seen by me:    Latest Ref Rng & Units 03/27/2023   10:50 AM 03/26/2023    3:55 AM 03/18/2023    8:28 AM  CBC  WBC 4.0 - 10.5 K/uL 8.5  7.3  4.9   Hemoglobin 13.0 - 17.0 g/dL 89.0  88.4  87.0   Hematocrit 39.0 - 52.0 % 32.9  34.5  39.3   Platelets 150 - 400 K/uL 207  203  215       Latest Ref Rng & Units 03/27/2023   10:50 AM 03/26/2023    3:55 AM 03/18/2023    8:28 AM  CMP  Glucose 70 - 99 mg/dL 848  832  91   BUN 8 - 23 mg/dL 25  23  21    Creatinine 0.61 - 1.24 mg/dL 9.25  9.08  9.17   Sodium 135 - 145 mmol/L 135  138  139   Potassium 3.5 - 5.1 mmol/L 3.9  4.9  4.7   Chloride 98 - 111 mmol/L 101  106  106   CO2 22 - 32 mmol/L 26  24  24    Calcium  8.9 - 10.3 mg/dL 8.7  8.9  9.2     Pertinent Imagings/Other Imagings Plain films and CT images have been personally visualized and interpreted; radiology reports have been reviewed. Decision making incorporated into the Impression / Recommendations.  US  EKG SITE RITE Result Date: 03/26/2023 If Site Rite image not attached, placement  could not be confirmed due to current cardiac rhythm.  I have personally spent 86 minutes involved in face-to-face and non-face-to-face activities for this patient on the day of the visit. Professional time spent includes the following activities: Preparing to see the patient (review of tests), Obtaining and/or reviewing separately obtained history (admission/discharge record), Performing a medically appropriate examination and/or evaluation , Ordering medications/tests/procedures, referring and communicating with other health care professionals, Documenting clinical information in the EMR, Independently interpreting results (not separately reported), Communicating results to the patient/family/caregiver, Counseling and educating the patient/family/caregiver and Care coordination (not separately  reported).  Electronically signed by:   Plan d/w requesting provider as well as ID pharm D  Of note, portions of this note may have been created with voice recognition software. While this note has been edited for accuracy, occasional wrong-word or 'sound-a-like' substitutions may have occurred due to the inherent limitations of voice recognition software.   Annalee Orem, MD Infectious Disease Physician [facility] Pager: [phone]

## 2023-03-28 NOTE — Plan of Care (Signed)
  Problem: Activity: Goal: Risk for activity intolerance will decrease Outcome: Progressing   Problem: Pain Management: Goal: General experience of comfort will improve Outcome: Progressing   Problem: Safety: Goal: Ability to remain free from injury will improve Outcome: Progressing

## 2023-03-28 NOTE — Progress Notes (Signed)
 Pharmacy Antibiotic Note  [name] is a 68 y.o. male admitted on 03/25/2023 with chronic infection of left total knee revision arthroplasty. S/p open synovectomy with incision and drainage left knee, polyliner exchange left knee in OR today. Patient received Vancomycin  1g IV x 1 today at 1637. Pharmacy has been consulted for Vancomycin  dosing post-operatively.  Will likely be discharged on vancomycin . Anticipating ID consult for OPAT   Plan: Continue vancomycin  1250mg  IV q12h Vancomycin  levels at steady state, as indicated Monitor renal function, cultures, clinical course  Height: 5' 9 (175.3 cm) Weight: 93 kg (205 lb) IBW/kg (Calculated) : 70.7  Temp (24hrs), Avg:98.4 F (36.9 C), Min:98.4 F (36.9 C), Max:98.5 F (36.9 C)  Recent Labs  Lab 03/26/23 0355 03/27/23 1050  WBC 7.3 8.5  CREATININE 0.91 0.74    Estimated Creatinine Clearance: 100.9 mL/min (by C-G formula based on SCr of 0.74 mg/dL).    No Known Allergies  Antimicrobials this admission: 1/3 IV Vancomycin  >>  Dose adjustments this admission: --  Microbiology results: 1/3 Synovial fluid: NGTD    Dolphus Roller, PharmD, BCPS 03/28/2023 12:11 PM

## 2023-03-28 NOTE — Progress Notes (Signed)
 Physical Therapy Treatment Patient Details Name: [name] MRN: [id number] DOB: Jan 18, 1956 Today's Date: 03/28/2023   History of Present Illness Pt is a 68 year old male s/p open synovectomy with incision and drainage left knee and polyliner exchange left knee on 03/25/23.  PMHx: multiple left knee surgeries, DM, HTN, HLD, left LE lymphedema from prostate cancer hx    PT Comments  Pt progressing toward goals, reports a little more LLE soreness today however still motivated to work with PT.  Deferred knee flexion d/t slight breakthrough bleeding noted L knee dressing after being changed earlier this am Continue PT in acute setting.    If plan is discharge home, recommend the following: Assist for transportation;Help with stairs or ramp for entrance   Can travel by private vehicle        Equipment Recommendations  None recommended by PT    Recommendations for Other Services       Precautions / Restrictions Precautions Precautions: Knee;Other (comment) Precaution Comments: no aggressive knee flexion per MD Restrictions Weight Bearing Restrictions Per Provider Order: No LLE Weight Bearing Per Provider Order: Weight bearing as tolerated     Mobility  Bed Mobility Overal bed mobility: Needs Assistance Bed Mobility: Supine to Sit     Supine to sit: Supervision, HOB elevated     General bed mobility comments: pt self assists L LE with R LE, supervision for lines and safety    Transfers Overall transfer level: Needs assistance Equipment used: Rolling walker (2 wheels) Transfers: Sit to/from Stand Sit to Stand: Supervision           General transfer comment: verbal cues for UE placement  and safety    Ambulation/Gait Ambulation/Gait assistance: Contact guard assist, Supervision Gait Distance (Feet): 360 Feet Assistive device: Rolling walker (2 wheels) Gait Pattern/deviations: Step-through pattern, Decreased stance time - left       General Gait Details:  steady with RW, slight break through bleeding observed on dressing which was replaced this am; pt reports feels good to ambulate   Stairs             Wheelchair Mobility     Tilt Bed    Modified Rankin (Stroke Patients Only)       Balance                                            Cognition Arousal: Alert Behavior During Therapy: WFL for tasks assessed/performed Overall Cognitive Status: Within Functional Limits for tasks assessed                                          Exercises Total Joint Exercises Ankle Circles/Pumps: AROM, Both, 10 reps Quad Sets: AROM, Both, 10 reps Heel Slides: Limitations Heel Slides Limitations: deferred d/t slight break through bleeding    General Comments        Pertinent Vitals/Pain Pain Assessment Pain Assessment: 0-10 Pain Score: 4  Pain Location: left knee Pain Descriptors / Indicators: Aching, Sore Pain Intervention(s): Limited activity within patient's tolerance, Monitored during session, Premedicated before session, Repositioned, Ice applied    Home Living                          Prior Function  PT Goals (current goals can now be found in the care plan section) Acute Rehab PT Goals PT Goal Formulation: With patient Time For Goal Achievement: 04/02/23 Potential to Achieve Goals: Good Progress towards PT goals: Progressing toward goals    Frequency    7X/week      PT Plan      Co-evaluation              AM-PAC PT 6 Clicks Mobility   Outcome Measure  Help needed turning from your back to your side while in a flat bed without using bedrails?: A Little Help needed moving from lying on your back to sitting on the side of a flat bed without using bedrails?: A Little Help needed moving to and from a bed to a chair (including a wheelchair)?: A Little Help needed standing up from a chair using your arms (e.g., wheelchair or bedside chair)?: A  Little Help needed to walk in hospital room?: A Little Help needed climbing 3-5 steps with a railing? : A Little 6 Click Score: 18    End of Session Equipment Utilized During Treatment: Gait belt Activity Tolerance: Patient tolerated treatment well Patient left: in chair;with call bell/phone within reach;with chair alarm set Nurse Communication: Mobility status PT Visit Diagnosis: Difficulty in walking, not elsewhere classified (R26.2)     Time: 9047-8981 PT Time Calculation (min) (ACUTE ONLY): 26 min  Charges:    $Gait Training: 23-37 mins PT General Charges $$ ACUTE PT VISIT: 1 Visit                     Eve Rey, PT  Acute Rehab Dept [facility]) [phone]  03/28/2023    [facility] 03/28/2023, 10:29 AM

## 2023-03-29 DIAGNOSIS — M00862 Arthritis due to other bacteria, left knee: Secondary | ICD-10-CM

## 2023-03-29 LAB — GLUCOSE, CAPILLARY
Glucose-Capillary: 65 mg/dL — ABNORMAL LOW (ref 70–99)
Glucose-Capillary: 92 mg/dL (ref 70–99)
Glucose-Capillary: 97 mg/dL (ref 70–99)
Glucose-Capillary: 128 mg/dL — ABNORMAL HIGH (ref 70–99)
Glucose-Capillary: 196 mg/dL — ABNORMAL HIGH (ref 70–99)

## 2023-03-29 MED ORDER — SODIUM CHLORIDE 0.9 % IV SOLN
2.0000 g | INTRAVENOUS | Status: DC
  Administered 2023-03-29: 2 g via INTRAVENOUS
  Filled 2023-03-29: qty 20

## 2023-03-29 NOTE — Plan of Care (Signed)
  Problem: Education: Goal: Knowledge of General Education information will improve Description: Including pain rating scale, medication(s)/side effects and non-pharmacologic comfort measures Outcome: Adequate for Discharge   Problem: Health Behavior/Discharge Planning: Goal: Ability to manage health-related needs will improve Outcome: Adequate for Discharge   Problem: Clinical Measurements: Goal: Ability to maintain clinical measurements within normal limits will improve Outcome: Progressing Goal: Will remain free from infection Outcome: Progressing Goal: Diagnostic test results will improve Outcome: Progressing Goal: Respiratory complications will improve Outcome: Progressing Goal: Cardiovascular complication will be avoided Outcome: Progressing   Problem: Activity: Goal: Risk for activity intolerance will decrease Outcome: Adequate for Discharge   Problem: Nutrition: Goal: Adequate nutrition will be maintained Outcome: Completed/Met   Problem: Coping: Goal: Level of anxiety will decrease Outcome: Progressing   Problem: Elimination: Goal: Will not experience complications related to bowel motility Outcome: Progressing Goal: Will not experience complications related to urinary retention Outcome: Completed/Met   Problem: Pain Management: Goal: General experience of comfort will improve Outcome: Adequate for Discharge   Problem: Safety: Goal: Ability to remain free from injury will improve Outcome: Adequate for Discharge   Problem: Skin Integrity: Goal: Risk for impaired skin integrity will decrease Outcome: Adequate for Discharge   Problem: Metabolic: Goal: Ability to maintain appropriate glucose levels will improve Outcome: Completed/Met   Problem: Nutritional: Goal: Maintenance of adequate nutrition will improve Outcome: Completed/Met Goal: Progress toward achieving an optimal weight will improve Outcome: Adequate for Discharge   Problem: Skin  Integrity: Goal: Risk for impaired skin integrity will decrease Outcome: Completed/Met   Problem: Tissue Perfusion: Goal: Adequacy of tissue perfusion will improve Outcome: Completed/Met   Problem: Education: Goal: Knowledge of the prescribed therapeutic regimen will improve Outcome: Adequate for Discharge Goal: Individualized Educational Video(s) Outcome: Completed/Met   Problem: Activity: Goal: Ability to avoid complications of mobility impairment will improve Outcome: Adequate for Discharge Goal: Range of joint motion will improve Outcome: Adequate for Discharge   Problem: Clinical Measurements: Goal: Postoperative complications will be avoided or minimized Outcome: Progressing   Problem: Pain Management: Goal: Pain level will decrease with appropriate interventions Outcome: Adequate for Discharge   Problem: Skin Integrity: Goal: Will show signs of wound healing Outcome: Progressing

## 2023-03-29 NOTE — Progress Notes (Signed)
 Patient ID: [name], male   DOB: 05-28-1955, 68 y.o.   MRN: [id number] The patient is awake and alert.  He reports only mild pain and is comfortable.  His left leg has definitely decreased in terms of swelling.  There is no redness at all and incision looks great.  Our goal is to try to salvage this knee.  Although the cultures have not grown anything, we felt that there was suspect fluid and tissue consistent with some type of infectious process.  I do feel that he needs prolonged IV antibiotics and then oral antibiotics.  He is immunocompromise with prostate cancer and diabetes and is working on better diabetic control.  I do appreciate the Infectious Disease service consulting on this patient.  Hopefully a decision can be made soon about the type of antibiotic to send him out on in the near future.  I will check on him later this afternoon.

## 2023-03-29 NOTE — Inpatient Diabetes Management (Signed)
 Inpatient Diabetes Program Recommendations  AACE/ADA: New Consensus Statement on Inpatient Glycemic Control (2015)  Target Ranges:  Prepandial:   less than 140 mg/dL      Peak postprandial:   less than 180 mg/dL (1-2 hours)      Critically ill patients:  140 - 180 mg/dL   Lab Results  Component Value Date   GLUCAP 92 03/29/2023   HGBA1C 8.2 (H) 03/18/2023    Review of Glycemic Control  Latest Reference Range & Units 03/28/23 07:53 03/28/23 11:51 03/28/23 17:01 03/28/23 21:45 03/29/23 07:54 03/29/23 08:28  Glucose-Capillary 70 - 99 mg/dL 84 77 888 (H) 886 (H) 65 (L) 92  (H): Data is abnormally high (L): Data is abnormally low  Diabetes history: DM2 Outpatient Diabetes medications: Tresiba  90 units every day, Novolog  10-24 units TID Current orders for Inpatient glycemic control: Semglee  90 units every day, Novolog  10-24 units TID  Inpatient Diabetes Program Recommendations:    Please consider:  Semglee  70 units every day DC Novolog  10-24 units TID  Add Novolog  0-15 units TID  Will continue to follow while inpatient.  Thank you, Wyvonna Pinal, MSN, CDCES Diabetes Coordinator Inpatient Diabetes Program [phone] (team pager from 8a-5p)

## 2023-03-29 NOTE — Progress Notes (Signed)
 Hypoglycemic Event  CBG: 65  Treatment:  4 oz juice/soda Symptoms: None  Follow-up CBG: Time:0830 CBG Result:92  Possible Reasons for Event: Medication regimen:   and Unknown  Comments/MD notified: will notify    Lynelle Doctor

## 2023-03-29 NOTE — Progress Notes (Signed)
 Physical Therapy Treatment Patient Details Name: [name] MRN: [id number] DOB: [birth year] Today's Date: 03/29/2023   History of Present Illness Pt is a 68 year old male s/p open synovectomy with incision and drainage left knee and polyliner exchange left knee on 03/25/23.  PMHx: multiple left knee surgeries, DM, HTN, HLD, left LE lymphedema from prostate cancer hx    PT Comments  Pt progressing steadily, feels LLE is more edematous today however does not appear so to PT or pt wife. Instructed  pt wife how to transition bed into reverse trendelenberg for lymphatic drainage/elevation when pt returns to bed later today.  Pt asking about using lymphedema pumps at home, likely ok but advised pt to clear this with Dr. Vernetta.  Pt provided with shampoo cap per his request; requested visit from Theo, therapy dog as pt feeling frustrated with hospitalization today (per wife).  Continue PT POC.    If plan is discharge home, recommend the following: Assist for transportation;Help with stairs or ramp for entrance   Can travel by private vehicle        Equipment Recommendations  None recommended by PT    Recommendations for Other Services       Precautions / Restrictions Precautions Precautions: Knee;Other (comment) Precaution Comments: no aggressive knee flexion per MD Restrictions LLE Weight Bearing Per Provider Order: Weight bearing as tolerated     Mobility  Bed Mobility Overal bed mobility: Needs Assistance Bed Mobility: Supine to Sit     Supine to sit: HOB elevated, Modified independent (Device/Increase time)     General bed mobility comments: pt self assists L LE with R LE,    Transfers Overall transfer level: Needs assistance Equipment used: Rolling walker (2 wheels) Transfers: Sit to/from Stand Sit to Stand: Supervision           General transfer comment: for safety    Ambulation/Gait Ambulation/Gait assistance: Supervision Gait Distance (Feet): 360  Feet Assistive device: Rolling walker (2 wheels) Gait Pattern/deviations: Step-through pattern       General Gait Details: good gait stability, improved wt shift to LLE   Stairs         General stair comments: verbalizes technique, reviewed stairs on 03/26/2023   Wheelchair Mobility     Tilt Bed    Modified Rankin (Stroke Patients Only)       Balance                                            Cognition Arousal: Alert Behavior During Therapy: WFL for tasks assessed/performed Overall Cognitive Status: Within Functional Limits for tasks assessed                                          Exercises Total Joint Exercises Ankle Circles/Pumps: AROM, Both, 10 reps Heel Slides Limitations:  (functional active knee flexion EOB to pt tol)    General Comments        Pertinent Vitals/Pain Pain Assessment Pain Assessment: 0-10 Pain Score: 4  Pain Location: left knee Pain Descriptors / Indicators: Aching, Sore Pain Intervention(s): Limited activity within patient's tolerance, Monitored during session, Premedicated before session, Repositioned    Home Living  Prior Function            PT Goals (current goals can now be found in the care plan section) Acute Rehab PT Goals PT Goal Formulation: With patient Time For Goal Achievement: 04/02/23 Potential to Achieve Goals: Good Progress towards PT goals: Progressing toward goals    Frequency    7X/week      PT Plan      Co-evaluation              AM-PAC PT 6 Clicks Mobility   Outcome Measure  Help needed turning from your back to your side while in a flat bed without using bedrails?: A Little Help needed moving from lying on your back to sitting on the side of a flat bed without using bedrails?: A Little Help needed moving to and from a bed to a chair (including a wheelchair)?: A Little Help needed standing up from a chair using  your arms (e.g., wheelchair or bedside chair)?: A Little Help needed to walk in hospital room?: A Little Help needed climbing 3-5 steps with a railing? : A Little 6 Click Score: 18    End of Session Equipment Utilized During Treatment: Gait belt Activity Tolerance: Patient tolerated treatment well Patient left: in chair;with call bell/phone within reach;with family/visitor present Nurse Communication: Mobility status PT Visit Diagnosis: Difficulty in walking, not elsewhere classified (R26.2)     Time: 8881-8856 PT Time Calculation (min) (ACUTE ONLY): 25 min  Charges:    $Gait Training: 23-37 mins PT General Charges $$ ACUTE PT VISIT: 1 Visit                     Jillana Selph, PT  Acute Rehab Dept [facility]) [phone]  03/29/2023    [facility] 03/29/2023, 11:49 AM

## 2023-03-30 ENCOUNTER — Other Ambulatory Visit (HOSPITAL_COMMUNITY): Payer: Self-pay

## 2023-03-30 ENCOUNTER — Other Ambulatory Visit: Payer: Self-pay | Admitting: Radiology

## 2023-03-30 ENCOUNTER — Telehealth (HOSPITAL_COMMUNITY): Payer: Self-pay | Admitting: Pharmacy Technician

## 2023-03-30 DIAGNOSIS — M00862 Arthritis due to other bacteria, left knee: Secondary | ICD-10-CM | POA: Diagnosis not present

## 2023-03-30 DIAGNOSIS — Z96659 Presence of unspecified artificial knee joint: Secondary | ICD-10-CM

## 2023-03-30 LAB — CREATININE, SERUM
Creatinine, Ser: 0.68 mg/dL (ref 0.61–1.24)
GFR, Estimated: >60 mL/min (ref >60)

## 2023-03-30 LAB — SURGICAL PATHOLOGY

## 2023-03-30 MED ORDER — RIVAROXABAN 10 MG PO TABS: 20.0000 mg | ORAL_TABLET | Freq: Every day | ORAL | Status: DC

## 2023-03-30 MED ORDER — RIFAMPIN 300 MG PO CAPS
300.0000 mg | ORAL_CAPSULE | Freq: Two times a day (BID) | ORAL | Status: DC
Start: 2023-03-30 — End: 2023-03-30
  Filled 2023-03-30: qty 1

## 2023-03-30 MED ORDER — HEPARIN SOD (PORK) LOCK FLUSH 100 UNIT/ML IV SOLN
250.0000 [IU] | INTRAVENOUS | Status: AC | PRN
  Administered 2023-03-30: 250 [IU]
  Filled 2023-03-30: qty 3

## 2023-03-30 MED ORDER — RIFAMPIN 300 MG PO CAPS: 300.0000 mg | ORAL_CAPSULE | Freq: Two times a day (BID) | ORAL | 3 refills | Status: DC

## 2023-03-30 MED ORDER — OXYCODONE HCL 5 MG PO TABS: 5.0000 mg | ORAL_TABLET | ORAL | 0 refills | Status: DC | PRN

## 2023-03-30 MED ORDER — VANCOMYCIN IV (FOR PTA / DISCHARGE USE ONLY): 1250.0000 mg | Freq: Two times a day (BID) | INTRAVENOUS | 0 refills | Status: DC

## 2023-03-30 MED ORDER — RIVAROXABAN 20 MG PO TABS: 20.0000 mg | ORAL_TABLET | Freq: Every day | ORAL | 0 refills | Status: DC

## 2023-03-30 MED ORDER — VANCOMYCIN HCL 1250 MG/250ML IV SOLN: 1250.0000 mg | Freq: Two times a day (BID) | INTRAVENOUS | 6 refills | Status: DC

## 2023-03-30 NOTE — TOC Transition Note (Signed)
 Transition of Care [facility]) - Discharge Note   Patient Details  Name: [name] MRN: [id number] Date of Birth: 1955-10-02  Transition of Care [facility]) CM/SW Contact:  NORMAN ASPEN, LCSW Phone Number: 03/30/2023, 1:07 PM   Clinical Narrative:     Pt medically cleared for dc home today.  Have confirmed with both [facility] (IV abx) and [facility]) they are aware of dc and ready to begin services.  Pt and family aware/ agreeable.  No further TOC needs.  Final next level of care: Home w Home Health Services Barriers to Discharge: Barriers Resolved   Patient Goals and CMS Choice Patient states their goals for this hospitalization and ongoing recovery are:: To return home with home IV antibiotics and home health PT and RN. CMS Medicare.gov Compare Post Acute Care list provided to:: Patient Choice offered to / list presented to : Patient [place] ownership interest in [facility]provided to:: Patient    Discharge Placement                       Discharge Plan and Services Additional resources added to the After Visit Summary for       Post Acute Care Choice: Home Health          DME Arranged: IV pump/equipment DME Agency: Other - Comment [facility]) Date DME Agency Contacted: 03/26/23 Time DME Agency Contacted: [phone] Representative spoke with at DME Agency: [facility] HH Arranged: PT, RN HH Agency: [facility] Date [facility] Agency Contacted: 03/26/23 Time HH Agency Contacted: [phone] Representative spoke with at [facility] Agency: Ar[facility]  Social Drivers of Health (SDOH) Interventions SDOH Screenings   Food Insecurity: No Food Insecurity (03/25/2023)  Housing: Low Risk  (03/25/2023)  Transportation Needs: No Transportation Needs (03/25/2023)  Utilities: Not At Risk (03/25/2023)  Depression (PHQ2-9): Low Risk  (06/15/2021)  Financial Resource Strain: Low Risk  (11/12/2021)  Social Connections: Unknown (03/26/2023)  Stress: No Stress Concern Present (09/01/2020)  Tobacco Use:  Low Risk  (03/25/2023)     Readmission Risk Interventions    03/30/2023    1:06 PM  Readmission Risk Prevention Plan  Post Dischage Appt Complete  Medication Screening Complete  Transportation Screening Complete

## 2023-03-30 NOTE — Progress Notes (Signed)
 PHARMACY CONSULT NOTE FOR:  OUTPATIENT  PARENTERAL ANTIBIOTIC THERAPY (OPAT)  Indication: MRSE L-knee PJI Regimen: Vancomycin  1250 mg IV every 12 hours + Rifampin  300 mg po bid End date: 05/06/23 (6 weeks from OR 03/25/23)  Of noting, the patient's Rivaroxaban  for VTE px s/p ortho surgery was increased from 10 mg every day to 20 mg daily with supper due to the interaction with Rifampin . If the patient discontinues or stops rifampin  prior to the end of his rivaroxaban  course, his dose will need to be reduced back down to 10 mg.   IV antibiotic discharge orders are pended. To discharging provider:  please sign these orders via discharge navigator,  Select New Orders & click on the button choice - Manage This Unsigned Work.     Thank you for allowing pharmacy to be a part of this patient's care.  Almarie Lunger, PharmD, BCPS, BCIDP Infectious Diseases Clinical Pharmacist 03/30/2023 10:30 AM   **Pharmacist phone directory can now be found on [email or web address] ([email or web address]).  Listed under [facility].

## 2023-03-30 NOTE — Plan of Care (Signed)
  Problem: Education: Goal: Knowledge of General Education information will improve Description: Including pain rating scale, medication(s)/side effects and non-pharmacologic comfort measures Outcome: Adequate for Discharge   Problem: Health Behavior/Discharge Planning: Goal: Ability to manage health-related needs will improve Outcome: Adequate for Discharge   Problem: Clinical Measurements: Goal: Ability to maintain clinical measurements within normal limits will improve Outcome: Progressing Goal: Will remain free from infection Outcome: Progressing Goal: Diagnostic test results will improve Outcome: Adequate for Discharge Goal: Respiratory complications will improve Outcome: Completed/Met Goal: Cardiovascular complication will be avoided Outcome: Completed/Met   Problem: Activity: Goal: Risk for activity intolerance will decrease Outcome: Adequate for Discharge   Problem: Coping: Goal: Level of anxiety will decrease Outcome: Progressing   Problem: Elimination: Goal: Will not experience complications related to bowel motility Outcome: Completed/Met   Problem: Pain Management: Goal: General experience of comfort will improve Outcome: Adequate for Discharge   Problem: Safety: Goal: Ability to remain free from injury will improve Outcome: Progressing   Problem: Skin Integrity: Goal: Risk for impaired skin integrity will decrease Outcome: Adequate for Discharge   Problem: Education: Goal: Knowledge of the prescribed therapeutic regimen will improve Outcome: Completed/Met   Problem: Activity: Goal: Ability to avoid complications of mobility impairment will improve Outcome: Adequate for Discharge Goal: Range of joint motion will improve Outcome: Adequate for Discharge   Problem: Clinical Measurements: Goal: Postoperative complications will be avoided or minimized Outcome: Progressing   Problem: Pain Management: Goal: Pain level will decrease with appropriate  interventions Outcome: Adequate for Discharge   Problem: Skin Integrity: Goal: Will show signs of wound healing Outcome: Progressing

## 2023-03-30 NOTE — Discharge Summary (Signed)
 Patient ID: [name] MRN: [id number] DOB/AGE: 1955-12-11 68 y.o.  Admit date: 03/25/2023 Discharge date: 03/30/2023  Admission Diagnoses:  Principal Problem:   Chronic infection of left knee [facility]) Active Problems:   Infection of total left knee replacement [facility])   Discharge Diagnoses:  Same  Past Medical History:  Diagnosis Date   Arthritis    Diabetes mellitus without complication (HCC) 2019   Diabetes Type II   GERD (gastroesophageal reflux disease)    diet controlled   HTN (hypertension) 10/27/2018   Hyperlipidemia    Internal hemorrhoids 2014   noted on colonoscopy   MSSA (methicillin susceptible Staphylococcus aureus) infection 09/03/2020   Osteoarthritis of both knees    Prostate cancer (HCC)    prostate   Sigmoid diverticulosis 2014   Mild, noted on colonoscopy   Skin lesion 01/01/2021   Type 2 diabetes mellitus with other specified complication (HCC) 01/01/2021   Vaccine counseling 05/04/2021    Surgeries: Procedure(s): INCISION AND DRAINAGE LEFT KNEE, SYNOVECTOMY,  POLY-LINER EXCHANGE on 03/25/2023   Consultants:   Discharged Condition: Improved  Hospital Course: [name] is an 68 y.o. male who was admitted 03/25/2023 for operative treatment ofChronic infection of left knee (HCC).  He was taken to the operating room on the day of admission and we did find what we felt was gross purulence from the knee.  A thorough synovectomy was carried out combined with placement of deep antibiotics and a poly liner exchange.  During his hospitalization a PICC line was placed.  The infectious disease service was consulted.  His cultures did grow a staph species which was Staph epidermidis.  He was placed on appropriate antibiotics and the plan is for 6 weeks of IV antibiotics and chronic suppression.  The patient understands that he still may end up needing all components removed for second time.  He will be seen in the office in close  follow-up.   Procedure(s): INCISION AND DRAINAGE LEFT KNEE, SYNOVECTOMY,  POLY-LINER EXCHANGE.    Patient was given perioperative antibiotics:  Anti-infectives (From admission, onward)    Start     Dose/Rate Route Frequency Ordered Stop   03/30/23 1115  rifampin  (RIFADIN ) capsule 300 mg        300 mg Oral Every 12 hours 03/30/23 1015     03/30/23 0000  rifampin  (RIFADIN ) 300 MG capsule        300 mg Oral Every 12 hours 03/30/23 1146     03/30/23 0000  vancomycin  (VANCOREADY) 1250 MG/250ML SOLN        1,250 mg Intravenous Every 12 hours 03/30/23 1146     03/29/23 0800  cefTRIAXone  (ROCEPHIN ) 2 g in sodium chloride  0.9 % 100 mL IVPB  Status:  Discontinued        2 g 200 mL/hr over 30 Minutes Intravenous Every 24 hours 03/29/23 0636 03/29/23 1322   03/26/23 0000  vancomycin  (VANCOREADY) IVPB 1250 mg/250 mL        1,250 mg 166.7 mL/hr over 90 Minutes Intravenous Every 12 hours 03/25/23 2137     03/25/23 1738  tobramycin  (NEBCIN ) powder  Status:  Discontinued          As needed 03/25/23 1738 03/25/23 2120   03/25/23 1737  vancomycin  (VANCOCIN ) powder  Status:  Discontinued          As needed 03/25/23 1738 03/25/23 2120   03/25/23 1345  vancomycin  (VANCOCIN ) 1-5 GM/200ML-% IVPB       Note to Pharmacy: Gaines, Janel  R: cabinet override      03/25/23 1345 03/26/23 0159        Patient was given sequential compression devices, early ambulation, and chemoprophylaxis to prevent DVT.  Patient benefited maximally from hospital stay and there were no complications.    Recent vital signs: Patient Vitals for the past 24 hrs:  BP Temp Temp src Pulse Resp SpO2  03/30/23 0641 128/69 97.7 F (36.5 C) Oral 70 18 97 %  03/29/23 2141 129/65 98.1 F (36.7 C) Oral 76 17 93 %  03/29/23 1359 118/74 98.3 F (36.8 C) Oral 76 18 97 %     Recent laboratory studies:  Recent Labs    03/30/23 1001  CREATININE 0.68     Discharge Medications:   Allergies as of 03/30/2023   No Known Allergies       Medication List     TAKE these medications    amoxicillin  500 MG tablet Commonly known as: AMOXIL  Take 1 tablet (500 mg total) by mouth 2 (two) times daily. Take 2 tablets 30 minutes before your dental procedure and then the other 2 tablets 6 hours after the procedure.   aspirin  81 MG chewable tablet Chew 1 tablet (81 mg total) by mouth 2 (two) times daily.   celecoxib  200 MG capsule Commonly known as: CELEBREX  Take 1 capsule (200 mg total) by mouth 2 (two) times daily between meals as needed.   chlorhexidine  4 % external liquid Commonly known as: HIBICLENS  Apply 15 mLs (1 Application total) topically as directed for 30 doses. Use as directed daily for 5 days every other week for 6 weeks.   diclofenac  75 MG EC tablet Commonly known as: VOLTAREN  TAKE (1) TABLET BY MOUTH TWICE DAILY WITH A MEAL AS NEEDED FOR PAIN/INFLAMMATION.   escitalopram  10 MG tablet Commonly known as: LEXAPRO  Take 10 mg by mouth daily.   FreeStyle Libre 14 Day Sensor Misc SMARTSIG:Topical Every 10 Days   HYDROcodone -acetaminophen  7.5-325 MG tablet Commonly known as: NORCO Take 1 tablet by mouth 2 (two) times daily as needed for moderate pain (pain score 4-6).   insulin  aspart 100 UNIT/ML injection Commonly known as: novoLOG  Inject 10-24 Units into the skin 3 (three) times daily with meals. Per sliding scale   losartan -hydrochlorothiazide  50-12.5 MG tablet Commonly known as: HYZAAR TAKE (1) TABLET BY MOUTH ONCE DAILY.   mupirocin  ointment 2 % Commonly known as: BACTROBAN  Place 1 Application into the nose 2 (two) times daily for 60 doses. Use as directed 2 times daily for 5 days every other week for 6 weeks.   oxyCODONE  5 MG immediate release tablet Commonly known as: Oxy IR/ROXICODONE  Take 1-2 tablets (5-10 mg total) by mouth every 4 (four) hours as needed for moderate pain (pain score 4-6) (pain score 4-6).   rifampin  300 MG capsule Commonly known as: RIFADIN  Take 1 capsule (300 mg total) by  mouth every 12 (twelve) hours.   rivaroxaban  20 MG Tabs tablet Commonly known as: XARELTO  Take 1 tablet (20 mg total) by mouth daily with supper. Start taking on: March 31, 2023   rosuvastatin  20 MG tablet Commonly known as: CRESTOR  TAKE 1 TABLET BY MOUTH EVERY DAY   Tresiba  FlexTouch 100 UNIT/ML FlexTouch Pen Generic drug: insulin  degludec Inject 90 Units into the skin daily.   vancomycin  1250 MG/250ML Soln Commonly known as: VANCOREADY Inject 250 mLs (1,250 mg total) into the vein every 12 (twelve) hours.               Durable  Medical Equipment  (From admission, onward)           Start     Ordered   03/25/23 2126  DME 3 n 1  Once        03/25/23 2125   03/25/23 2126  DME Walker rolling  Once       Question Answer Comment  Walker: With 5 Inch Wheels   Patient needs a walker to treat with the following condition Status post revision of total knee replacement, left      03/25/23 2125            Diagnostic Studies: US  EKG SITE RITE Result Date: 03/26/2023 If Site Rite image not attached, placement could not be confirmed due to current cardiac rhythm.   Disposition: Discharge disposition: 01-Home or Self Care            Signed: [name] 03/30/2023, 12:05 PM

## 2023-03-30 NOTE — Progress Notes (Addendum)
 [facility] Infectious Diseases Follow Up Note  Patient Identification: Patient Name: [name] MRN: [id number] Admit Date: 03/25/2023 12:57 PM Age: 68 y.o.Today's Date: 03/30/2023  Reason for Visit: recurrent PJI   Principal Problem:   Chronic infection of left knee (HCC) Active Problems:   Infection of total left knee replacement (HCC)  Antibiotics:  Vancomycin  1/3-   Lines/Hardware: left knee TKR, PICC rt arm    Interval Events: continues to be afebrile, no labs today    Assessment 79 Y O male with HTN, HLD, GERD, DM, Prostate ca, complicated Left knee PJI ( see consult note ) admitted with    # Recurrent Left knee PJI ( MSSA, MRSA, MRSE) - 1/3 S/p open synovecytomy with I and D and polyliner exchange. Findings: Gross purulence involving the left knee tibial revision component, Gram stain and cultures pending of fluid and soft tissue as well as tissue sent to pathology to evaluate inflammatory cells per high-power field. OR cx MRSE - Of note, he was able to tolerate Rifampin  partially when he had PJI in 2022.     # DM2 - A1c 8.2 - discussed optimal BG control    Recommendations  - Continue Vancomycin , pharmacy to dose.  - Discussed about adding PO rifampin  to help with biofilm- he is agreeable. DDIs reviewed with ID pharm D and will need minor dose adjustment for his VTE prophylaxis ( Rivaroxaban  , dose increased from 10 mg daily to 20 mg daily) in short term - Already has a PICC in place. Plan for 6 weeks IV abtx from OR then PO suppression thereafter, possibly indefinitely due to number of recurrences - Monitor CBC , BMP and Vancomycin  trough, ESR and CRP weekly  - Will send for daptomycin  and omadacycline sensitivity to [facility].  - Fu OR cx to completion, otherwise ID will SO.   OPAT  Diagnosis: Recurrent PJI   Culture Result: MRSE   No Known Allergies  OPAT Orders Discharge antibiotics to be given via PICC  line Discharge antibiotics:  Vancomycin  1250 mg IV every 12 hours + Rifampin  300 mg po bid  Per pharmacy protocol  Aim for Vancomycin  trough 15-20 or AUC 400-550 (unless otherwise indicated) Duration: 6 weeks  End Date: 05/04/22  [facility] Care Per Protocol:  Home health RN for IV administration and teaching; PICC line care and labs.    Labs weekly while on IV antibiotics: _X_ CBC with differential _X_ BMP __ CMP _X_ CRP _X_ ESR _X_ Vancomycin  trough __ CK  __ Please pull PIC at completion of IV antibiotics X__ Please leave PIC in place until doctor has seen patient or been notified  Fax weekly labs to [phone]  Clinic Follow Up Appt: 1/24 at 10: 45 am  Rest of the management as per the primary team. Thank you for the consult. Please page with pertinent questions or concerns.  ______________________________________________________________________ Subjective patient seen and examined at the bedside. No complaints, eager to go home. He cannot remember details about if any tolerability issues with Rifampim previously but willing to re-try   Vitals BP 128/69 (BP Location: Left Arm)   Pulse 70   Temp 97.7 F (36.5 C) (Oral)   Resp 18   Ht 5' 9 (1.753 m)   Wt 93 kg   SpO2 97%   BMI 30.27 kg/m     Physical Exam Constitutional:  adult male sitting up in the bed and not in acute distress     Comments: HEENT wnl   Cardiovascular:  Rate and Rhythm: Normal rate and regular rhythm.     Heart sounds:   Pulmonary:     Effort: Pulmonary effort is normal.     Comments:   Abdominal:     Palpations: Abdomen is non distended     Tenderness:   Musculoskeletal:        General: No swelling or tenderness in peripheral joints, Left knee bandaged C/D/I  Skin:    Comments: no rashes   Neurological:     General: awake, alert and oriented, grossly non focal   Psychiatric:        Mood and Affect: Mood normal.   Pertinent Microbiology Results for orders placed or  performed during the hospital encounter of 03/25/23  Aerobic/Anaerobic Culture w Gram Stain (surgical/deep wound)     Status: None (Preliminary result)   Collection Time: 03/25/23  4:28 PM   Specimen: Synovial, Left Knee; Body Fluid  Result Value Ref Range Status   Specimen Description   Final    KNEE Synov, Knee Performed at [facility], [address], [place], [place] [address]    Special Requests   Final    NONE Performed at [facility], [address], [place], [place] [address]    Gram Stain   Final    FEW WBC SEEN NO ORGANISMS SEEN Performed at [facility], [address], [place], [place] [address]    Culture   Final    RARE STAPHYLOCOCCUS EPIDERMIDIS NO ANAEROBES ISOLATED; CULTURE IN PROGRESS FOR 5 DAYS    Report Status PENDING  Incomplete   Organism ID, Bacteria STAPHYLOCOCCUS EPIDERMIDIS  Final      Susceptibility   Staphylococcus epidermidis - MIC*    CIPROFLOXACIN <=0.5 SENSITIVE Sensitive     ERYTHROMYCIN >=8 RESISTANT Resistant     GENTAMICIN <=0.5 SENSITIVE Sensitive     OXACILLIN >=4 RESISTANT Resistant     TETRACYCLINE >=16 RESISTANT Resistant     VANCOMYCIN  1 SENSITIVE Sensitive     TRIMETH /SULFA  20 SENSITIVE Sensitive     CLINDAMYCIN <=0.25 SENSITIVE Sensitive     RIFAMPIN  <=0.5 SENSITIVE Sensitive     Inducible Clindamycin NEGATIVE Sensitive     * RARE STAPHYLOCOCCUS EPIDERMIDIS  Aerobic/Anaerobic Culture w Gram Stain (surgical/deep wound)     Status: None (Preliminary result)   Collection Time: 03/25/23  4:33 PM   Specimen: Soft Tissue, Other  Result Value Ref Range Status   Specimen Description   Final    TISSUE SFT TISS OTH Performed at [facility], [address], [place], [place] [address]    Special Requests   Final    NONE Performed at [facility], [address], [place], [place] [address]    Gram Stain FEW WBC SEEN NO ORGANISMS SEEN   Final   Culture    Final    RARE STAPHYLOCOCCUS EPIDERMIDIS SUSCEPTIBILITIES PERFORMED ON PREVIOUS CULTURE WITHIN THE LAST 5 DAYS. Performed at [facility], [address] [place], [place] [address]    Report Status PENDING  Incomplete   Pertinent Lab.    Latest Ref Rng & Units 03/27/2023   10:50 AM 03/26/2023    3:55 AM 03/18/2023    8:28 AM  CBC  WBC 4.0 - 10.5 K/uL 8.5  7.3  4.9   Hemoglobin 13.0 - 17.0 g/dL 89.0  88.4  87.0   Hematocrit 39.0 - 52.0 % 32.9  34.5  39.3   Platelets 150 - 400  K/uL 207  203  215       Latest Ref Rng & Units 03/30/2023   10:01 AM 03/27/2023   10:50 AM 03/26/2023    3:55 AM  CMP  Glucose 70 - 99 mg/dL  848  832   BUN 8 - 23 mg/dL  25  23   Creatinine 9.38 - 1.24 mg/dL 9.31  9.25  9.08   Sodium 135 - 145 mmol/L  135  138   Potassium 3.5 - 5.1 mmol/L  3.9  4.9   Chloride 98 - 111 mmol/L  101  106   CO2 22 - 32 mmol/L  26  24   Calcium  8.9 - 10.3 mg/dL  8.7  8.9      Pertinent Imaging today Plain films and CT images have been personally visualized and interpreted; radiology reports have been reviewed. Decision making incorporated into the Impression /   No results found.  I have personally spent involved in face-to-face and non-face-to-face activities for this patient on the day of the visit. Professional time spent includes the following activities: Preparing to see the patient (review of tests), Obtaining and/or reviewing separately obtained history (admission/discharge record), Performing a medically appropriate examination and/or evaluation , Ordering medications/tests/procedures, referring and communicating with other health care professionals, Documenting clinical information in the EMR, Independently interpreting results (not separately reported), Communicating results to the patient/family/caregiver, Counseling and educating the patient/family/caregiver and Care coordination (not separately reported).   Plan d/w requesting provider as well as ID  pharm D  Of note, portions of this note may have been created with voice recognition software. While this note has been edited for accuracy, occasional wrong-word or 'sound-a-like' substitutions may have occurred due to the inherent limitations of voice recognition software.   Electronically signed by:   Annalee Orem, MD Infectious Disease Physician [facility] Pager: [phone]

## 2023-03-30 NOTE — Telephone Encounter (Signed)
 Pharmacy Patient Advocate Encounter  Insurance verification completed.    The patient is insured through [employer]/ [employer]. Patient has Medicare and is not eligible for a copay card, but may be able to apply for patient assistance or Medicare RX Payment Plan (Patient Must reach out to their plan, if eligible for payment plan), if available.    Ran test claim for Rifampin  300mg  and the current 42 day co-pay is $52.49.   This test claim was processed through [facility]- copay amounts may vary at other pharmacies due to pharmacy/plan contracts, or as the patient moves through the different stages of their insurance plan.

## 2023-03-30 NOTE — Progress Notes (Signed)
 Patient ID: [name], male   DOB: 1955-11-08, 68 y.o.   MRN: [id number] The patient continues to do well clinically.  I did change his left knee dressing at the bedside and gave extra dressing supplies for him to go home with.  He can get the dressing wet in the shower.  He can also use his lymphedema machine.  I absolutely appreciate the infectious disease service consulting on this patient and the recommendations in an attempt to try to salvage the patient's knee.  Will work on getting him discharged to home later today.

## 2023-03-30 NOTE — Progress Notes (Signed)
 Patient ID: [name], male   DOB: [birth year], 68 y.o.   MRN: [id number] I have not seen the patient yet this morning.  I did see that his cultures grew out staph epidermidis.  There are also sensitivities that are listed.  Hopefully we can discharge him to home later this afternoon once ID gives their final recommendations as to antibiotics and duration.

## 2023-03-30 NOTE — Progress Notes (Signed)
 Physical Therapy Treatment Patient Details Name: [name] MRN: [id number] DOB: 1955/12/17 Today's Date: 03/30/2023   History of Present Illness Pt is a 68 year old male s/p open synovectomy with incision and drainage left knee and polyliner exchange left knee on 03/25/23.  PMHx: multiple left knee surgeries, DM, HTN, HLD, left LE lymphedema from prostate cancer hx    PT Comments  Pt making progressing  well, meeting goals. Ready for d/c from PT standpoint.    If plan is discharge home, recommend the following: Assist for transportation;Help with stairs or ramp for entrance   Can travel by private vehicle        Equipment Recommendations  None recommended by PT    Recommendations for Other Services       Precautions / Restrictions Precautions Precautions: Knee;Other (comment) Precaution Comments: no aggressive knee flexion per MD Restrictions Weight Bearing Restrictions Per Provider Order: No LLE Weight Bearing Per Provider Order: Weight bearing as tolerated     Mobility  Bed Mobility Overal bed mobility: Needs Assistance Bed Mobility: Supine to Sit     Supine to sit: HOB elevated, Modified independent (Device/Increase time)     General bed mobility comments: pt self assists L LE with R LE,    Transfers   Equipment used: Rolling walker (2 wheels) Transfers: Sit to/from Stand Sit to Stand: Modified independent (Device/Increase time)                Ambulation/Gait Ambulation/Gait assistance: Modified independent (Device/Increase time) Gait Distance (Feet): 360 Feet Assistive device: Rolling walker (2 wheels) Gait Pattern/deviations: Step-through pattern       General Gait Details: good gait stability, improved wt shift to LLE. working on knee flexion during swing phase   Comptroller Bed    Modified Rankin (Stroke Patients Only)       Balance                                             Cognition Arousal: Alert Behavior During Therapy: WFL for tasks assessed/performed Overall Cognitive Status: Within Functional Limits for tasks assessed                                          Exercises      General Comments        Pertinent Vitals/Pain Pain Assessment Pain Assessment: 0-10 Pain Score: 4  Pain Location: left knee Pain Descriptors / Indicators: Aching, Sore Pain Intervention(s): Limited activity within patient's tolerance, Monitored during session, Premedicated before session, Repositioned    Home Living                          Prior Function            PT Goals (current goals can now be found in the care plan section) Acute Rehab PT Goals PT Goal Formulation: With patient Time For Goal Achievement: 04/02/23 Potential to Achieve Goals: Good Progress towards PT goals: Progressing toward goals    Frequency    7X/week      PT Plan      Co-evaluation  AM-PAC PT 6 Clicks Mobility   Outcome Measure  Help needed turning from your back to your side while in a flat bed without using bedrails?: None Help needed moving from lying on your back to sitting on the side of a flat bed without using bedrails?: None Help needed moving to and from a bed to a chair (including a wheelchair)?: A Little Help needed standing up from a chair using your arms (e.g., wheelchair or bedside chair)?: A Little Help needed to walk in hospital room?: A Little Help needed climbing 3-5 steps with a railing? : A Little 6 Click Score: 20    End of Session Equipment Utilized During Treatment: Gait belt Activity Tolerance: Patient tolerated treatment well Patient left: in chair;with call bell/phone within reach;with family/visitor present Nurse Communication: Mobility status PT Visit Diagnosis: Difficulty in walking, not elsewhere classified (R26.2)     Time: 8984-8964 PT Time Calculation (min) (ACUTE ONLY):  20 min  Charges:    $Gait Training: 8-22 mins PT General Charges $$ ACUTE PT VISIT: 1 Visit                     Madge Therrien, PT  Acute Rehab Dept [facility]) [phone]  03/30/2023    [facility] 03/30/2023, 10:51 AM

## 2023-03-30 NOTE — Inpatient Diabetes Management (Signed)
 Inpatient Diabetes Program Recommendations  AACE/ADA: New Consensus Statement on Inpatient Glycemic Control (2015)  Target Ranges:  Prepandial:   less than 140 mg/dL      Peak postprandial:   less than 180 mg/dL (1-2 hours)      Critically ill patients:  140 - 180 mg/dL   Lab Results  Component Value Date   GLUCAP 196 (H) 03/29/2023   HGBA1C 8.2 (H) 03/18/2023    Review of Glycemic Control  Latest Reference Range & Units 03/29/23 07:54 03/29/23 08:28 03/29/23 11:41 03/29/23 17:04 03/29/23 21:43  Glucose-Capillary 70 - 99 mg/dL 65 (L) 92 97 871 (H) 803 (H)  (L): Data is abnormally low (H): Data is abnormally high  Diabetes history: DM2 Outpatient Diabetes medications: Tresiba  90 units every day, Novolog  10-24 units TID Current orders for Inpatient glycemic control: Semglee  90 units every day, Novolog  10-24 units TID  Patient refusing correction Novolog  10-24 units TID due to running low which is appropriate.   Inpatient Diabetes Program Recommendations:     Please consider:   Semglee  70 units every day DC Novolog  10-24 units TID  Add Novolog  0-15 units TID   Will continue to follow while inpatient.   Thank you, Wyvonna Pinal, MSN, CDCES Diabetes Coordinator Inpatient Diabetes Program [phone] (team pager from 8a-5p)

## 2023-03-31 NOTE — Progress Notes (Signed)
 Making a slight change to lab orders to accommodate for weekly hepatic function checks while on Rifampin . See the following changes bolded below.   Labs - Sunday/Monday:  CBC/D, CMP, and vancomycin  trough. Labs - Thursday:  BMP and vancomycin  trough Labs - Once weekly: ESR and CRP  OPAT Orders Discharge antibiotics to be given via PICC line Discharge antibiotics:  Vancomycin  1250 mg IV every 12 hours + Rifampin  300 mg po bid  Per pharmacy protocol  Aim for Vancomycin  trough 15-20 or AUC 400-550 (unless otherwise indicated) Duration: 6 weeks  End Date: 05/04/22   [facility] Care Per Protocol:   Home health RN for IV administration and teaching; PICC line care and labs.     Labs weekly while on IV antibiotics: _X_ CBC with differential __ BMP _X_ CMP _X_ CRP _X_ ESR _X_ Vancomycin  trough __ CK   __ Please pull PIC at completion of IV antibiotics X__ Please leave PIC in place until doctor has seen patient or been notified   Fax weekly labs to [phone]   Clinic Follow Up Appt: 1/24 at 10: 45 am  Thank you for allowing pharmacy to be a part of this patient's care.  Almarie Lunger, PharmD, BCPS, BCIDP Infectious Diseases Clinical Pharmacist 03/31/2023 8:47 AM   **Pharmacist phone directory can now be found on [email or web address] ([email or web address]).  Listed under [facility].

## 2023-04-01 LAB — AEROBIC/ANAEROBIC CULTURE W GRAM STAIN (SURGICAL/DEEP WOUND)

## 2023-04-04 ENCOUNTER — Telehealth: Payer: Self-pay | Admitting: Orthopaedic Surgery

## 2023-04-04 ENCOUNTER — Other Ambulatory Visit: Payer: Self-pay | Admitting: Cardiology

## 2023-04-04 DIAGNOSIS — I10 Essential (primary) hypertension: Secondary | ICD-10-CM

## 2023-04-04 NOTE — Telephone Encounter (Signed)
 Patient's wife Diane called advised (PT) was suppose to be coming out to the house but, no one has contacted them yet. Diane asked for a call back as soon as possible to let them know if someone will be coming. The number to contact Diane is [phone]

## 2023-04-04 NOTE — Telephone Encounter (Signed)
 Spoke with [facility] they are contacting him now

## 2023-04-04 NOTE — Telephone Encounter (Signed)
**Note De-identified  [place] Obfuscation** Please advise 

## 2023-04-05 LAB — MINIMUM INHIBITORY CONC. (1 DRUG)

## 2023-04-05 LAB — MIC RESULT

## 2023-04-05 LAB — LAB REPORT - SCANNED: EGFR: 95

## 2023-04-07 ENCOUNTER — Encounter: Payer: Self-pay | Admitting: Physician Assistant

## 2023-04-07 ENCOUNTER — Other Ambulatory Visit: Payer: Self-pay | Admitting: *Deleted

## 2023-04-07 ENCOUNTER — Other Ambulatory Visit: Payer: Self-pay

## 2023-04-07 ENCOUNTER — Ambulatory Visit: Payer: PPO | Admitting: Physician Assistant

## 2023-04-07 ENCOUNTER — Ambulatory Visit: Payer: PPO

## 2023-04-07 VITALS — Temp 97.8°F

## 2023-04-07 DIAGNOSIS — T8454XD Infection and inflammatory reaction due to internal left knee prosthesis, subsequent encounter: Secondary | ICD-10-CM

## 2023-04-07 MED ORDER — ALTEPLASE 100 MG IV SOLR: 2.0000 mg | Freq: Once | INTRAVENOUS | Status: AC

## 2023-04-07 NOTE — Progress Notes (Signed)
Patient presented today for Cath flo appointment. Stated nurse had been able to flush PICC line earlier today, but unable to draw blood. This RN flushed PICC line with 20 cc NS. Line flushed easily with no resistance noted. Blood return readily obtained. Patient stated he instilled heparin this morning, and plans to do so again this evening after next dose of antibiotics. All questions answered.  Wyvonne Lenz, RN

## 2023-04-07 NOTE — Progress Notes (Signed)
HPI: [name] returns today status post I&D left knee with poly exchange.  He reports he has had fever 3 days ago 100 to the 103.  Now fevers subsided.  He is still having shakes.  He states overall he does not feel well.  He is on Xarelto for DVT prophylaxis.  He has been taking ibuprofen and Celebrex for pain.  This also taking oxycodone.  He does have a PICC line that is indwelling.  He states that the home health nurse was able to infuse his IV antibiotics while he was unable to do his lab draws through the PICC line.  He is receiving vancomycin and rifampin.  Cultures interoperatively showed rare staph epidermis.  Left knee: Surgical incisions well-approximated nylon sutures no signs of infection or drainage.  No expressible purulence.  Calf supple nontender.  Significant lower leg edema throughout the left leg.  Full extension left knee flexion to approximately 70 degrees.  Impression: Status post left knee I&D with poly exchange  Plan: He is sent to the ID clinic on [address] for flushing of the PICC line.  They were able to flush and readily obtained blood from the line.  Will see him back on Monday the 21st for suture removal.  Continue IV antibiotics.  Did discuss with him that he should take no NSAIDs ibuprofen Advil Aleve or Celebrex while on Xarelto.  Questions were encouraged and answered by Dr. Magnus Ivan myself.

## 2023-04-11 ENCOUNTER — Ambulatory Visit (INDEPENDENT_AMBULATORY_CARE_PROVIDER_SITE_OTHER): Payer: PPO | Admitting: Physician Assistant

## 2023-04-11 DIAGNOSIS — T84068A Wear of articular bearing surface of other internal prosthetic joint, initial encounter: Secondary | ICD-10-CM

## 2023-04-11 DIAGNOSIS — Z96659 Presence of unspecified artificial knee joint: Secondary | ICD-10-CM

## 2023-04-11 DIAGNOSIS — T8454XD Infection and inflammatory reaction due to internal left knee prosthesis, subsequent encounter: Secondary | ICD-10-CM

## 2023-04-11 NOTE — Progress Notes (Signed)
HPI: Mr. Boch returns today for suture removal from his left knee.  Now 17 days status post I&D and poly exchange left knee.  He reports that his fevers have gone away.  He feels that he may have had the flu.  He continues to take Xarelto for DVT prophylaxis.  He is no longer taking oxycodone only taking Tylenol.  We did have his PICC line flushed and this is working well at this point in time.  Left knee: Near full extension flexion to approximate 85 degrees.  Surgical incisions healing well no signs of dehiscence or gross infection.  Impression: Status post left knee I&D with poly exchange Left knee infection with rare staph epidermis  Plan: He will continue work on range of motion.  Continue IV antibiotics.  Follow-up in 2 weeks sooner if there is any questions concerns.  Sutures removed Steri-Strips applied.  He is able to get the incision wet shower.

## 2023-04-14 ENCOUNTER — Encounter: Payer: Self-pay | Admitting: Internal Medicine

## 2023-04-14 ENCOUNTER — Other Ambulatory Visit: Payer: Self-pay

## 2023-04-14 ENCOUNTER — Telehealth: Payer: Self-pay | Admitting: Orthopaedic Surgery

## 2023-04-14 ENCOUNTER — Other Ambulatory Visit (HOSPITAL_COMMUNITY): Payer: Self-pay

## 2023-04-14 ENCOUNTER — Telehealth: Payer: Self-pay | Admitting: Radiology

## 2023-04-14 ENCOUNTER — Telehealth: Payer: Self-pay | Admitting: Pharmacist

## 2023-04-14 ENCOUNTER — Ambulatory Visit (INDEPENDENT_AMBULATORY_CARE_PROVIDER_SITE_OTHER): Payer: PPO | Admitting: Internal Medicine

## 2023-04-14 VITALS — BP 134/80 | HR 86 | Resp 16 | Ht 69.0 in | Wt 207.2 lb

## 2023-04-14 DIAGNOSIS — B9562 Methicillin resistant Staphylococcus aureus infection as the cause of diseases classified elsewhere: Secondary | ICD-10-CM | POA: Diagnosis not present

## 2023-04-14 DIAGNOSIS — B9561 Methicillin susceptible Staphylococcus aureus infection as the cause of diseases classified elsewhere: Secondary | ICD-10-CM

## 2023-04-14 DIAGNOSIS — T8454XD Infection and inflammatory reaction due to internal left knee prosthesis, subsequent encounter: Secondary | ICD-10-CM | POA: Diagnosis not present

## 2023-04-14 DIAGNOSIS — T8450XD Infection and inflammatory reaction due to unspecified internal joint prosthesis, subsequent encounter: Secondary | ICD-10-CM

## 2023-04-14 MED ORDER — ONDANSETRON 4 MG PO TBDP: 4.0000 mg | ORAL_TABLET | Freq: Three times a day (TID) | ORAL | 0 refills | Status: DC | PRN

## 2023-04-14 MED ORDER — RIVAROXABAN 10 MG PO TABS: 10.0000 mg | ORAL_TABLET | Freq: Every day | ORAL | 0 refills | Status: DC

## 2023-04-14 NOTE — Telephone Encounter (Signed)
Pts wife called triage, states that they are at Infectious Disease and that the Dr. There is asking about stopping an antibiotic and changing the blood thinner. Please call his wife back @ [phone]

## 2023-04-14 NOTE — Addendum Note (Signed)
Addended byRutha Bouchard T on: 04/14/2023 03:05 PM   Modules accepted: Orders

## 2023-04-14 NOTE — Telephone Encounter (Signed)
[facility] nurse Selena Batten [phone]) called this morning to inform us that patient has been experiencing flu-like symptoms including body aches, no appetite, and feeling like his "skin is crawling" (though no rash is present). States he has been experiencing these since starting rifampin and that they feel similar to his side effects when he took rifampin in the past. Informed them to hold the dose today but that I would reach out to you for further recommendations. He is scheduled to follow up with you tomorrow. Currently receiving vancomycin and rifampin for recurrent MRSE PJI with projected end date of 05/05/23.   Of note, if his rifampin is discontinued, his Xarelto dosing will need to be reduced from 20 mg daily to 10 mg daily for his DVT prophylaxis.  Thank you!   Margarite Gouge, PharmD, CPP, BCIDP, AAHIVP Clinical Pharmacist Practitioner Infectious Diseases Clinical Pharmacist [facility] for Infectious Disease

## 2023-04-14 NOTE — Progress Notes (Signed)
Labs drawn via PICC line per Dr. Gale Journey. Line flushed with 10 mL normal saline and clamped. Patient tolerated procedure well.   Beryle Flock, RN

## 2023-04-14 NOTE — Telephone Encounter (Signed)
Pt's wife Diane called back requesting a call from Dr Magnus Ivan. States they just left [facility] and they cut pt blood thinners down to 10 and they need to know for how long. Please call Diane at [phone].

## 2023-04-14 NOTE — Progress Notes (Addendum)
[facility]  Patient Active Problem List   Diagnosis Date Noted   Chronic infection of left knee (HCC) 03/24/2023   Status post revision of total replacement of left knee 08/27/2021   Status post revision of total knee replacement, left 08/14/2021   PICC (peripherally inserted central catheter) in place 06/09/2021   Vaccine counseling 05/04/2021   Skin lesion 01/01/2021   Type 2 diabetes mellitus with other specified complication (HCC) 01/01/2021   Nausea and vomiting 09/11/2020   MSSA (methicillin susceptible Staphylococcus aureus) infection 09/03/2020   Effusion, left knee 08/14/2020   Infection of total left knee replacement (HCC) 08/14/2020   Status post total left knee replacement 03/21/2019   S/P carpal tunnel release right 02/22/19 03/08/2019   Carpal tunnel syndrome of right wrist    HTN (hypertension) 10/27/2018   Prostate cancer (HCC) 05/30/2017   Malignant neoplasm of prostate (HCC) 04/28/2017   Chronic pain of both knees 02/21/2017   Unilateral primary osteoarthritis, left knee 02/21/2017   Unilateral primary osteoarthritis, right knee 02/21/2017      Subjective:    Patient ID: [name], male    DOB: April 25, 1955, 68 y.o.   MRN: [id number]  No chief complaint on file.   HPI:  [name] is a 68 y.o. male here with concern for medication side effect  He has recurrent left knee pji and was recently haid DAIR. Mrse grew this time and was started on vanc/rifampin  Has flu like sx with rifampin or what he thinks is due to it. Similar sx in the past with rifampin No obvious sick contact No rash No n/v/diarrhea No jaundice  Taking xarelto for dvt prophylaxis with ortho procedures    No Known Allergies    Outpatient Medications Prior to Visit  Medication Sig Dispense Refill   amoxicillin (AMOXIL) 500 MG tablet Take 1 tablet (500 mg total) by mouth 2 (two) times daily. Take 2 tablets 30 minutes before your  dental procedure and then the other 2 tablets 6 hours after the procedure. 4 tablet 1   aspirin 81 MG chewable tablet Chew 1 tablet (81 mg total) by mouth 2 (two) times daily. 60 tablet 0   chlorhexidine (HIBICLENS) 4 % external liquid Apply 15 mLs (1 Application total) topically as directed for 30 doses. Use as directed daily for 5 days every other week for 6 weeks. 946 mL 1   Continuous Blood Gluc Sensor (FREESTYLE LIBRE 14 DAY SENSOR) MISC SMARTSIG:Topical Every 10 Days     escitalopram (LEXAPRO) 10 MG tablet Take 10 mg by mouth daily.     insulin aspart (NOVOLOG) 100 UNIT/ML injection Inject 10-24 Units into the skin 3 (three) times daily with meals. Per sliding scale     losartan-hydrochlorothiazide (HYZAAR) 50-12.5 MG tablet TAKE (1) TABLET BY MOUTH ONCE DAILY. 30 tablet 0   mupirocin ointment (BACTROBAN) 2 % Place 1 Application into the nose 2 (two) times daily for 60 doses. Use as directed 2 times daily for 5 days every other week for 6 weeks. 60 g 0   rifampin (RIFADIN) 300 MG capsule Take 1 capsule (300 mg total) by mouth every 12 (twelve) hours. 60 capsule 3   rivaroxaban (XARELTO) 20 MG TABS tablet Take 1 tablet (20 mg total) by mouth daily with supper. 30 tablet 0   rosuvastatin (CRESTOR) 20 MG tablet TAKE 1 TABLET BY MOUTH EVERY DAY 90 tablet 1   TRESIBA FLEXTOUCH 100 UNIT/ML SOPN  FlexTouch Pen Inject 90 Units into the skin daily.     vancomycin (VANCOCIN) 10 G SOLR injection Inject into the vein.     vancomycin IVPB Inject 1,250 mg into the vein every 12 (twelve) hours. Indication:  MRSE PJI First Dose: Yes Last Day of Therapy:  05/06/23 Labs - Sunday/Monday:  CBC/D, BMP, and vancomycin trough. Labs - Thursday:  BMP and vancomycin trough Labs - Once weekly: ESR and CRP Method of administration:Elastomeric Method of administration may be changed at the discretion of the patient and/or caregiver's ability to self-administer the medication ordered. 74 Units 0   Facility-Administered  Medications Prior to Visit  Medication Dose Route Frequency Provider Last Rate Last Admin   alteplase (ACTIVASE) injection 2 mg  2 mg Intravenous Once          Social History   Socioeconomic History   Marital status: Married    Spouse name: Not on file   Number of children: 1   Years of education: Not on file   Highest education level: Not on file  Occupational History   Occupation: pastor  Tobacco Use   Smoking status: Never   Smokeless tobacco: Never  Vaping Use   Vaping status: Never Used  Substance and Sexual Activity   Alcohol use: No   Drug use: No   Sexual activity: Yes  Other Topics Concern   Not on file  Social History Narrative   Renato Gails   Married to [employer]   Daughter, Abby Junious Dresser, RN III, Resides in [place]   Social Drivers of Health   Financial Resource Strain: Low Risk  (11/12/2021)   Overall Financial Resource Strain (CARDIA)    Difficulty of Paying Living Expenses: Not hard at all  Food Insecurity: No Food Insecurity (03/25/2023)   Hunger Vital Sign    Worried About Running Out of Food in the Last Year: Never true    Ran Out of Food in the Last Year: Never true  Transportation Needs: No Transportation Needs (03/25/2023)   PRAPARE - Administrator, Civil Service (Medical): No    Lack of Transportation (Non-Medical): No  Physical Activity: Not on file  Stress: No Stress Concern Present (09/01/2020)   [employer] of Occupational Health - Occupational Stress Questionnaire    Feeling of Stress : Not at all  Social Connections: Unknown (03/26/2023)   Social Connection and Isolation Panel [NHANES]    Frequency of Communication with Friends and Family: Patient declined    Frequency of Social Gatherings with Friends and Family: Patient declined    Attends Religious Services: Not on Insurance claims handler of Clubs or Organizations: Patient declined    Attends Banker Meetings: Patient unable to answer    Marital Status:  Patient declined  Intimate Partner Violence: Not At Risk (03/25/2023)   Humiliation, Afraid, Rape, and Kick questionnaire    Fear of Current or Ex-Partner: No    Emotionally Abused: No    Physically Abused: No    Sexually Abused: No      Review of Systems    All other ros negative  Objective:    Resp 16   Ht 5\' 9"  (1.753 m)   Wt 207 lb 3.2 oz (94 kg)   BMI 30.60 kg/m  Nursing note and vital signs reviewed.  Physical Exam     General/constitutional: no distress, pleasant HEENT: Normocephalic, PER, Conj Clear, EOMI, Oropharynx clear Neck supple CV: rrr no mrg Lungs: clear to auscultation,  normal respiratory effort Abd: Soft, Nontender Ext: no edema Skin: No Rash Neuro: nonfocal MSK: left knee incision healed; no erythema/tenderness; good rom; slight swelling left lower ext  Labs: 03/31/23 opat labs  Cr 0.96 No lft No cbc No esr/crp Vanc trough 14.8  1/22 labs not available  Micro:  Serology:  Imaging:  Assessment & Plan:   Problem List Items Addressed This Visit   None Visit Diagnoses       Infection of prosthetic joint, subsequent encounter    -  Primary   Relevant Orders   CBC w/Diff   COMPLETE METABOLIC PANEL WITH GFR         Antibiotics:  Vancomycin 1/3- Rifampin 1/3-c   Lines/Hardware: Right Upper Ext Picc   Assessment 68 Y O male with HTN, HLD, GERD, DM, Prostate ca, complicated Left knee PJI, here for follow up       # Recurrent Left knee PJI ( MSSA, MRSA, MRSE) - 03/25/23 S/p open synovecytomy with I and D and polyliner exchange. Findings: Gross purulence involving the left knee tibial revision component, Gram stain and cultures pending of fluid and soft tissue as well as tissue sent to pathology to evaluate inflammatory cells per high-power field. OR cx MRSE - Of note, he was able to tolerate Rifampin partially when he had PJI in 2022.  And decision to retreat with combination therapy with rifampin/vanc - Dr Elinor Parkinson  previously reviewed with ID pharm --> VTE prophylaxis ( Rivaroxaban , dose increased from 10 mg daily to 20 mg daily) in short term - plan for 6 weeks iv then oral suppression possibly indefinitely given multiple recurrences - daptomycin and omadacycline sensitivity to [facility].     At this time 6 weeks planned therapy to end on 05/04/22   ------ 04/14/23 id clinic visit Patient reports flu like sx (body aches, decreased appetite, "skin crawling", since starting rifampin. Similar sx in the past with rifampin Noted pharmacy note to decrease xarelto back to 10 mg if rifampin stopped  If dress is pressent might have to use steroid  No opat labs for me to review recently  Will get cbc/cmp today to evaluate for possible adverse drug reaction  In the mean time will stop rifampin for now  Decrease xarelto back to 10 mg  F/u 2 weeks with dr Elinor Parkinson    Follow-up: Return in about 2 weeks (around 04/28/2023).   Addendum: Zofran rx for nausea   Raymondo Band, MD [facility] 04/14/2023, 2:33 PM

## 2023-04-14 NOTE — Telephone Encounter (Signed)
LMOM for wife of the below message

## 2023-04-14 NOTE — Telephone Encounter (Signed)
LVM with patient to review symptoms further. Labs show slight uptrend in CRP and ESR from 1/13 to 1/20.   1/13 labs = CRP 34, ESR 27, eosinophils at 3% 1/20 labs = CRP 77, ESR 42, eosinophils at 6%  Tried to reach home health nurse as well without success. Will try to reach her again soon.

## 2023-04-14 NOTE — Patient Instructions (Signed)
Please pickup your xarelto 10 mg today and start that Stop the 20 mg dosing Ask your orthopedic surgeon how long you need to be on xarelto   Stop rifampin today   Labs to be drawn here   See dr Elinor Parkinson in 2 weeks or sooner if symptoms are worse with stopping rifampin

## 2023-04-15 ENCOUNTER — Telehealth: Payer: Self-pay

## 2023-04-15 ENCOUNTER — Inpatient Hospital Stay: Payer: PPO | Admitting: Infectious Diseases

## 2023-04-15 DIAGNOSIS — R112 Nausea with vomiting, unspecified: Secondary | ICD-10-CM

## 2023-04-15 LAB — CBC WITH DIFFERENTIAL/PLATELET
Absolute Lymphocytes: 577 {cells}/uL — ABNORMAL LOW (ref 850–3900)
Absolute Monocytes: 577 {cells}/uL (ref 200–950)
Basophils Absolute: 21 {cells}/uL (ref 0–200)
Basophils Relative: 0.4 %
Eosinophils Absolute: 291 {cells}/uL (ref 15–500)
Eosinophils Relative: 5.6 %
HCT: 31.3 % — ABNORMAL LOW (ref 38.5–50.0)
Hemoglobin: 10.3 g/dL — ABNORMAL LOW (ref 13.2–17.1)
MCH: 27.9 pg (ref 27.0–33.0)
MCHC: 32.9 g/dL (ref 32.0–36.0)
MCV: 84.8 fL (ref 80.0–100.0)
MPV: 9.7 fL (ref 7.5–12.5)
Monocytes Relative: 11.1 %
Neutro Abs: 3734 {cells}/uL (ref 1500–7800)
Neutrophils Relative %: 71.8 %
Platelets: 236 10*3/uL (ref 140–400)
RBC: 3.69 10*6/uL — ABNORMAL LOW (ref 4.20–5.80)
RDW: 14.5 % (ref 11.0–15.0)
Total Lymphocyte: 11.1 %
WBC: 5.2 10*3/uL (ref 3.8–10.8)

## 2023-04-15 LAB — COMPLETE METABOLIC PANEL WITH GFR
AG Ratio: 1.1 (calc) (ref 1.0–2.5)
ALT: 33 U/L (ref 9–46)
AST: 31 U/L (ref 10–35)
Albumin: 3.3 g/dL — ABNORMAL LOW (ref 3.6–5.1)
Alkaline phosphatase (APISO): 282 U/L — ABNORMAL HIGH (ref 35–144)
BUN: 15 mg/dL (ref 7–25)
CO2: 24 mmol/L (ref 20–32)
Calcium: 9.1 mg/dL (ref 8.6–10.3)
Chloride: 101 mmol/L (ref 98–110)
Creat: 0.9 mg/dL (ref 0.70–1.35)
Globulin: 2.9 g/dL (ref 1.9–3.7)
Glucose, Bld: 178 mg/dL — ABNORMAL HIGH (ref 65–99)
Potassium: 3.9 mmol/L (ref 3.5–5.3)
Sodium: 138 mmol/L (ref 135–146)
Total Bilirubin: 0.4 mg/dL (ref 0.2–1.2)
Total Protein: 6.2 g/dL (ref 6.1–8.1)
eGFR: 94 mL/min/{1.73_m2} (ref >=60)

## 2023-04-15 NOTE — Telephone Encounter (Signed)
Patient wife Diane aware and voiced her understanding.   Brittinee Risk Lesli Albee, CMA

## 2023-04-15 NOTE — Telephone Encounter (Signed)
Thank you :)

## 2023-04-15 NOTE — Telephone Encounter (Signed)
Patient wife called to ask if provider has reviewed lab results from yesterday. Also wanted to know if provider was going to send in a steroid for patient. If so patient uses [facility] in [place].    Geisha Abernathy Lesli Albee, CMA

## 2023-04-15 NOTE — Telephone Encounter (Signed)
PA for ondansetron submitted via covermymeds. Awaiting response.   Key: [id number]  Sandie Ano, RN

## 2023-04-15 NOTE — Telephone Encounter (Signed)
I actually just spoke with [name] and his wife to ensure he stopped the rifampin and changed his Xarelto dose which he confirmed. They understand his labs look great and no steroids are needed.  Margarite Gouge, PharmD, CPP, BCIDP, AAHIVP Clinical Pharmacist Practitioner Infectious Diseases Clinical Pharmacist [facility]

## 2023-04-15 NOTE — Telephone Encounter (Signed)
Labs look ok   No steroid  Thanks

## 2023-04-18 ENCOUNTER — Other Ambulatory Visit: Payer: Self-pay

## 2023-04-18 ENCOUNTER — Other Ambulatory Visit: Payer: Self-pay | Admitting: Orthopaedic Surgery

## 2023-04-18 ENCOUNTER — Telehealth: Payer: Self-pay | Admitting: Radiology

## 2023-04-18 ENCOUNTER — Other Ambulatory Visit (HOSPITAL_COMMUNITY): Payer: Self-pay

## 2023-04-18 ENCOUNTER — Ambulatory Visit: Payer: PPO

## 2023-04-18 VITALS — BP 120/66 | HR 73 | Temp 98.1°F | Resp 16 | Ht 69.0 in | Wt 198.8 lb

## 2023-04-18 DIAGNOSIS — Z452 Encounter for adjustment and management of vascular access device: Secondary | ICD-10-CM | POA: Diagnosis not present

## 2023-04-18 MED ORDER — PROMETHAZINE HCL 25 MG PO TABS: 25.0000 mg | ORAL_TABLET | Freq: Three times a day (TID) | ORAL | 0 refills | Status: DC | PRN

## 2023-04-18 MED ORDER — ALTEPLASE 2 MG IJ SOLR
2.0000 mg | Freq: Once | INTRAMUSCULAR | Status: AC
  Administered 2023-04-18: 2 mg

## 2023-04-18 NOTE — Telephone Encounter (Signed)
Let's do 25 q8hr prn  And we can give him 10 days supply  Thanks Meg

## 2023-04-18 NOTE — Telephone Encounter (Signed)
Received call from [facility] that she is unable to get blood back from PICC line this morning. She states that this happened a couple of weeks ago and the patient was sent to a facility to have it done, however, she does not know where.  Per Ralph Dowdy, RNCM, patient was sent directly from here last time to [facility] on [address]-[phone].   I spoke with Erin at the Infusion Center. She is going to work patient in today. She will reach out to her supervisor to see if he can get the order entered and then will let me know through Secure Chat so that I can call and give patient appointment time.   HHRN is aware and will explain to patient.

## 2023-04-18 NOTE — Telephone Encounter (Signed)
Per covermymeds, PA not required

## 2023-04-18 NOTE — Telephone Encounter (Signed)
Message below from [place] with Infusion Center.  Patient is scheduled for 1:30pm. Stated he received his medicine this morning, and they just did an additional blood draw to get his labs this morning

## 2023-04-18 NOTE — Telephone Encounter (Signed)
That's fine we can get him some promethazine for now  Thank you

## 2023-04-18 NOTE — Progress Notes (Signed)
Diagnosis: PICC Line in Place  Provider:  Chilton Greathouse MD  Procedure:  Cath flo  IV Type: PICC, IV Location: R Upper Arm  Cathflo (Altepase), Dose: 2 mg  Patient presented for PICC assessment/cath flo. Per patient, [facility] RN able to administer medication but unable to obtain blood return from PICC this morning. PICC flushed with 20 mL NS; flushed easily but unable to obtain blood return. Cath flo instilled at 1350. PICC flushed and blood return obtained after 30 minute dwell time.   Post Infusion IV Care: PICC Line Flushed/Capped  Discharge: Condition: Stable, Destination: Home . AVS Declined  Performed by:  Wyvonne Lenz, RN

## 2023-04-18 NOTE — Addendum Note (Signed)
Addended by: Linna Hoff D on: 04/18/2023 03:23 PM   Modules accepted: Orders

## 2023-04-18 NOTE — Telephone Encounter (Signed)
Spoke with [place], notified him that ondansetron is not covered and that Dr. Renold Don is sending in promethazine instead. Patient verbalized understanding and has no further questions.   Sandie Ano, RN

## 2023-04-18 NOTE — Telephone Encounter (Signed)
Received notification that PA for ondansetron was denied.   Sandie Ano, RN

## 2023-04-19 ENCOUNTER — Telehealth: Payer: Self-pay

## 2023-04-19 LAB — AEROBIC/ANAEROBIC CULTURE W GRAM STAIN (SURGICAL/DEEP WOUND)

## 2023-04-19 NOTE — Telephone Encounter (Signed)
Patient's wife called stating patient has increased fatigue and back pain since IV abx.  Patient also has rash on his back.  Patient wife would like to know if these are side effects of the abx.  Please advise

## 2023-04-19 NOTE — Telephone Encounter (Signed)
Spoke to patient's wife.  She states the physical therapist was at the home today and feels the patient's rash is "drying out".  Patient wife will continue to monitor the rash and the patient and call if needing a sooner appointment.  Patient's wife requested lab results Transferred to the pharmacy team to discuss labs with the patient's wife.

## 2023-04-20 LAB — MISC LABCORP TEST (SEND OUT)

## 2023-04-25 ENCOUNTER — Encounter: Payer: PPO | Admitting: Physician Assistant

## 2023-04-28 ENCOUNTER — Ambulatory Visit: Payer: PPO | Admitting: Internal Medicine

## 2023-04-28 ENCOUNTER — Other Ambulatory Visit: Payer: Self-pay

## 2023-04-28 ENCOUNTER — Encounter: Payer: Self-pay | Admitting: Internal Medicine

## 2023-04-28 VITALS — BP 122/77 | HR 84 | Resp 16 | Ht 69.0 in | Wt 198.0 lb

## 2023-04-28 DIAGNOSIS — T847XXD Infection and inflammatory reaction due to other internal orthopedic prosthetic devices, implants and grafts, subsequent encounter: Secondary | ICD-10-CM | POA: Diagnosis not present

## 2023-04-28 DIAGNOSIS — T8450XD Infection and inflammatory reaction due to unspecified internal joint prosthesis, subsequent encounter: Secondary | ICD-10-CM

## 2023-04-28 DIAGNOSIS — T8454XD Infection and inflammatory reaction due to internal left knee prosthesis, subsequent encounter: Secondary | ICD-10-CM

## 2023-04-28 MED ORDER — SULFAMETHOXAZOLE-TRIMETHOPRIM 800-160 MG PO TABS: 2.0000 | ORAL_TABLET | Freq: Two times a day (BID) | ORAL | 1 refills | Status: AC

## 2023-04-28 NOTE — Patient Instructions (Signed)
 No vanc today Keep picc till next week   Start bactrim   2 tablets twice a day and can do tomorrow   If you tolerate bactrim  next week I'll remove picc   See me next week

## 2023-04-28 NOTE — Progress Notes (Signed)
 [facility]  Patient Active Problem List   Diagnosis Date Noted   Chronic infection of left knee (HCC) 03/24/2023   Status post revision of total replacement of left knee 08/27/2021   Status post revision of total knee replacement, left 08/14/2021   PICC (peripherally inserted central catheter) in place 06/09/2021   Vaccine counseling 05/04/2021   Skin lesion 01/01/2021   Type 2 diabetes mellitus with other specified complication (HCC) 01/01/2021   Nausea and vomiting 09/11/2020   MSSA (methicillin susceptible Staphylococcus aureus) infection 09/03/2020   Effusion, left knee 08/14/2020   Infection of total left knee replacement (HCC) 08/14/2020   Status post total left knee replacement 03/21/2019   S/P carpal tunnel release right 02/22/19 03/08/2019   Carpal tunnel syndrome of right wrist    HTN (hypertension) 10/27/2018   Prostate cancer (HCC) 05/30/2017   Malignant neoplasm of prostate (HCC) 04/28/2017   Chronic pain of both knees 02/21/2017   Unilateral primary osteoarthritis, left knee 02/21/2017   Unilateral primary osteoarthritis, right knee 02/21/2017      Subjective:    Patient ID: [name], male    DOB: 1955-12-03, 68 y.o.   MRN: [id number]  Chief Complaint  Patient presents with   Follow-up    Tingle sensations continue but not as bad -rash healed.     HPI:  [name] is a 68 y.o. male here with concern for medication side effect  He has recurrent left knee pji and was recently haid DAIR. Mrse grew this time and was started on vanc/rifampin   Has flu like sx with rifampin  or what he thinks is due to it. Similar sx in the past with rifampin  No obvious sick contact No rash No n/v/diarrhea No jaundice  Taking xarelto  for dvt prophylaxis with ortho procedures   04/28/23 id clinic f/u Symptoms of last visit almost gone A little tingling and lousy still  No fever, chill No rash No  n/v/diarrhea  Reviewed opat labs     Allergies  Allergen Reactions   Rifampin  Nausea Only and Other (See Comments)    Skin crawling- bodyache      Outpatient Medications Prior to Visit  Medication Sig Dispense Refill   Continuous Blood Gluc Sensor (FREESTYLE LIBRE 14 DAY SENSOR) MISC SMARTSIG:Topical Every 10 Days     escitalopram  (LEXAPRO ) 10 MG tablet Take 10 mg by mouth daily.     insulin  aspart (NOVOLOG ) 100 UNIT/ML injection Inject 10-24 Units into the skin 3 (three) times daily with meals. Per sliding scale     losartan -hydrochlorothiazide  (HYZAAR) 50-12.5 MG tablet TAKE (1) TABLET BY MOUTH ONCE DAILY. 30 tablet 0   promethazine  (PHENERGAN ) 25 MG tablet Take 1 tablet (25 mg total) by mouth every 8 (eight) hours as needed for nausea or vomiting. 30 tablet 0   rivaroxaban  (XARELTO ) 10 MG TABS tablet Take 1 tablet (10 mg total) by mouth daily. 30 tablet 0   rosuvastatin  (CRESTOR ) 20 MG tablet TAKE 1 TABLET BY MOUTH EVERY DAY 90 tablet 1   TRESIBA  FLEXTOUCH 100 UNIT/ML SOPN FlexTouch Pen Inject 90 Units into the skin daily.     vancomycin  IVPB Inject 1,250 mg into the vein every 12 (twelve) hours. Indication:  MRSE PJI First Dose: Yes Last Day of Therapy:  05/06/23 Labs - Sunday/Monday:  CBC/D, BMP, and vancomycin  trough. Labs - Thursday:  BMP and vancomycin  trough Labs - Once weekly: ESR and CRP Method of administration:Elastomeric Method of  administration may be changed at the discretion of the patient and/or caregiver's ability to self-administer the medication ordered. 74 Units 0   amoxicillin  (AMOXIL ) 500 MG tablet Take 1 tablet (500 mg total) by mouth 2 (two) times daily. Take 2 tablets 30 minutes before your dental procedure and then the other 2 tablets 6 hours after the procedure. (Patient not taking: Reported on 04/14/2023) 4 tablet 1   aspirin  81 MG chewable tablet Chew 1 tablet (81 mg total) by mouth 2 (two) times daily. (Patient not taking: Reported on 04/14/2023) 60  tablet 0   chlorhexidine  (HIBICLENS ) 4 % external liquid Apply 15 mLs (1 Application total) topically as directed for 30 doses. Use as directed daily for 5 days every other week for 6 weeks. (Patient not taking: Reported on 04/28/2023) 946 mL 1   vancomycin  (VANCOCIN ) 10 G SOLR injection Inject into the vein. (Patient not taking: Reported on 04/28/2023)     Facility-Administered Medications Prior to Visit  Medication Dose Route Frequency Provider Last Rate Last Admin   alteplase  (ACTIVASE ) injection 2 mg  2 mg Intravenous Once          Social History   Socioeconomic History   Marital status: Married    Spouse name: Not on file   Number of children: 1   Years of education: Not on file   Highest education level: Not on file  Occupational History   Occupation: pastor  Tobacco Use   Smoking status: Never   Smokeless tobacco: Never  Vaping Use   Vaping status: Never Used  Substance and Sexual Activity   Alcohol use: No   Drug use: No   Sexual activity: Yes  Other Topics Concern   Not on file  Social History Narrative   Juliene   Married to [employer]   Daughter, Abby Rory Burkes, RN III, Resides in [place]   Social Drivers of Health   Financial Resource Strain: Low Risk  (11/12/2021)   Overall Financial Resource Strain (CARDIA)    Difficulty of Paying Living Expenses: Not hard at all  Food Insecurity: No Food Insecurity (03/25/2023)   Hunger Vital Sign    Worried About Running Out of Food in the Last Year: Never true    Ran Out of Food in the Last Year: Never true  Transportation Needs: No Transportation Needs (03/25/2023)   PRAPARE - Administrator, Civil Service (Medical): No    Lack of Transportation (Non-Medical): No  Physical Activity: Not on file  Stress: No Stress Concern Present (09/01/2020)   [employer] of Occupational Health - Occupational Stress Questionnaire    Feeling of Stress : Not at all  Social Connections: Unknown (03/26/2023)   Social  Connection and Isolation Panel [NHANES]    Frequency of Communication with Friends and Family: Patient declined    Frequency of Social Gatherings with Friends and Family: Patient declined    Attends Religious Services: Not on Insurance Claims Handler of Clubs or Organizations: Patient declined    Attends Banker Meetings: Patient unable to answer    Marital Status: Patient declined  Intimate Partner Violence: Not At Risk (03/25/2023)   Humiliation, Afraid, Rape, and Kick questionnaire    Fear of Current or Ex-Partner: No    Emotionally Abused: No    Physically Abused: No    Sexually Abused: No      Review of Systems    All other ros negative  Objective:    BP 122/77  Pulse 84   Resp 16   Ht 5' 9 (1.753 m)   Wt 198 lb (89.8 kg)   BMI 29.24 kg/m  Nursing note and vital signs reviewed.  Physical Exam     General/constitutional: no distress, pleasant HEENT: Normocephalic, PER, Conj Clear, EOMI, Oropharynx clear Neck supple CV: rrr no mrg Lungs: clear to auscultation, normal respiratory effort Abd: Soft, Nontender Ext: no edema Skin: No Rash Neuro: nonfocal MSK: left knee incision healed; no erythema/tenderness; good rom; slight swelling left lower ext  Labs: See a&p  Micro:  Serology:  Imaging:  Assessment & Plan:   Problem List Items Addressed This Visit   None Visit Diagnoses       Infection of prosthetic joint, subsequent encounter    -  Primary   Relevant Medications   sulfamethoxazole -trimethoprim  (BACTRIM  DS) 800-160 MG tablet     Hardware complicating wound infection, subsequent encounter       Relevant Medications   sulfamethoxazole -trimethoprim  (BACTRIM  DS) 800-160 MG tablet          Antibiotics:  Vancomycin  1/3-04/28/23 Rifampin  1/3-1/23   Lines/Hardware: Right Upper Ext Picc   Assessment 68 Y O male with HTN, HLD, GERD, DM, Prostate ca, complicated Left knee PJI, here for follow up       # Recurrent Left knee PJI  ( MSSA, MRSA, MRSE) - 03/25/23 S/p open synovecytomy with I and D and polyliner exchange. Findings: Gross purulence involving the left knee tibial revision component, Gram stain and cultures pending of fluid and soft tissue as well as tissue sent to pathology to evaluate inflammatory cells per high-power field. OR cx MRSE - Of note, he was able to tolerate Rifampin  partially when he had PJI in 2022.  And decision to retreat with combination therapy with rifampin /vanc - Dr Dea previously reviewed with ID pharm --> VTE prophylaxis ( Rivaroxaban  , dose increased from 10 mg daily to 20 mg daily) in short term - plan for 6 weeks iv then oral suppression possibly indefinitely given multiple recurrences - daptomycin  and omadacycline sensitivity to [facility].     At this time 6 weeks planned therapy to end on 05/04/22   ------ 04/14/23 id clinic visit Patient reports flu like sx (body aches, decreased appetite, skin crawling, since starting rifampin . Similar sx in the past with rifampin  Noted pharmacy note to decrease xarelto  back to 10 mg if rifampin  stopped  If dress is pressent might have to use steroid  No opat labs for me to review recently  Will get cbc/cmp today to evaluate for possible adverse drug reaction  In the mean time will stop rifampin  for now  Decrease xarelto  back to 10 mg  F/u 2 weeks with dr Dea  ------------------- 04/28/23 id clinic assessment Opat labs 1/27 cbc 6/12/308 (no diff); cr 0.9; lft 35/34/231/0.3 Esr 47 Crp 7  Discussed need aggressive initial 3 months treatment and then lower dose suppression medication Given 3 recurrence I suggest to potentially indefinitely suppress   Reviewed mrsa, mssa, mrse susceptibility and bactrim  currently the only oral med working; most recently mrse   Today, start bactrim  2 double strength tablet twice a day for 6 weeks, then 1 ds tablet bid there after  Keep picc in until we know patient tolerate bactrim   See  me next week and will plan on removing picc in clinic if tolerate bactrim    Renal function test next week    Follow-up: Return in about 1 week (around 05/05/2023).   Addendum: Zofran  rx  for nausea   Constance ONEIDA Passer, MD [facility] 04/28/2023, 11:06 AM

## 2023-04-29 ENCOUNTER — Other Ambulatory Visit: Payer: Self-pay | Admitting: Orthopaedic Surgery

## 2023-05-05 ENCOUNTER — Ambulatory Visit: Payer: PPO | Admitting: Internal Medicine

## 2023-05-05 ENCOUNTER — Other Ambulatory Visit: Payer: Self-pay

## 2023-05-05 VITALS — BP 135/81 | HR 79 | Temp 97.9°F | Resp 16 | Ht 69.0 in | Wt 195.2 lb

## 2023-05-05 DIAGNOSIS — T8454XD Infection and inflammatory reaction due to internal left knee prosthesis, subsequent encounter: Secondary | ICD-10-CM

## 2023-05-05 DIAGNOSIS — T8450XD Infection and inflammatory reaction due to unspecified internal joint prosthesis, subsequent encounter: Secondary | ICD-10-CM

## 2023-05-05 DIAGNOSIS — B9561 Methicillin susceptible Staphylococcus aureus infection as the cause of diseases classified elsewhere: Secondary | ICD-10-CM | POA: Diagnosis not present

## 2023-05-05 DIAGNOSIS — B9562 Methicillin resistant Staphylococcus aureus infection as the cause of diseases classified elsewhere: Secondary | ICD-10-CM

## 2023-05-05 NOTE — Patient Instructions (Addendum)
Your crp is just a touch higher than 2 weeks ago  Clinically your left leg is baseline more swollen and ?slightly warmer then the right knee   If increased pain/swelling/redness left knee let me know sooner. Otherwise will see you in 2 weeks and repeat blood test I am not available but you'll see another provider just for blood testing   If you do well at 2 weeks will space next appointment to 6 weeks after that then likely every 3 months for the first year

## 2023-05-05 NOTE — Progress Notes (Signed)
 [facility]  Patient Active Problem List   Diagnosis Date Noted   Chronic infection of left knee (HCC) 03/24/2023   Status post revision of total replacement of left knee 08/27/2021   Status post revision of total knee replacement, left 08/14/2021   PICC (peripherally inserted central catheter) in place 06/09/2021   Vaccine counseling 05/04/2021   Skin lesion 01/01/2021   Type 2 diabetes mellitus with other specified complication (HCC) 01/01/2021   Nausea and vomiting 09/11/2020   MSSA (methicillin susceptible Staphylococcus aureus) infection 09/03/2020   Effusion, left knee 08/14/2020   Infection of total left knee replacement (HCC) 08/14/2020   Status post total left knee replacement 03/21/2019   S/P carpal tunnel release right 02/22/19 03/08/2019   Carpal tunnel syndrome of right wrist    HTN (hypertension) 10/27/2018   Prostate cancer (HCC) 05/30/2017   Malignant neoplasm of prostate (HCC) 04/28/2017   Chronic pain of both knees 02/21/2017   Unilateral primary osteoarthritis, left knee 02/21/2017   Unilateral primary osteoarthritis, right knee 02/21/2017      Subjective:    Patient ID: [name], male    DOB: 1955-10-05, 68 y.o.   MRN: [id number]  Chief Complaint  Patient presents with   Follow-up    PJI      HPI:  [name] is a 68 y.o. male here with concern for medication side effect  He has recurrent left knee pji and was recently haid DAIR. Mrse grew this time and was started on vanc/rifampin  Has flu like sx with rifampin or what he thinks is due to it. Similar sx in the past with rifampin No obvious sick contact No rash No n/v/diarrhea No jaundice  Taking xarelto for dvt prophylaxis with ortho procedures   04/28/23 id clinic f/u Symptoms of last visit almost gone A little tingling and lousy still  No fever, chill No rash No n/v/diarrhea  Reviewed opat labs   05/05/23 id clinic f/u Started on  bactrim ds 2 tab bid last week Tolerating it well The fatigue is improving, feeling a lot better today No f/c Left leg chronically lymphedema Left knee stable no increased pain/swelling  Reviewed opat labs; crp slightly higher than a couple weeks ago  Allergies  Allergen Reactions   Rifampin Nausea Only and Other (See Comments)    Skin crawling- bodyache      Outpatient Medications Prior to Visit  Medication Sig Dispense Refill   aspirin 81 MG chewable tablet Chew 1 tablet (81 mg total) by mouth 2 (two) times daily. (Patient not taking: Reported on 04/14/2023) 60 tablet 0   chlorhexidine (HIBICLENS) 4 % external liquid Apply 15 mLs (1 Application total) topically as directed for 30 doses. Use as directed daily for 5 days every other week for 6 weeks. (Patient not taking: Reported on 04/28/2023) 946 mL 1   Continuous Blood Gluc Sensor (FREESTYLE LIBRE 14 DAY SENSOR) MISC SMARTSIG:Topical Every 10 Days     escitalopram (LEXAPRO) 10 MG tablet Take 10 mg by mouth daily.     insulin aspart (NOVOLOG) 100 UNIT/ML injection Inject 10-24 Units into the skin 3 (three) times daily with meals. Per sliding scale     losartan-hydrochlorothiazide (HYZAAR) 50-12.5 MG tablet TAKE (1) TABLET BY MOUTH ONCE DAILY. 30 tablet 0   oxyCODONE (OXY IR/ROXICODONE) 5 MG immediate release tablet Take 1-2 tablets (5-10 mg total) by mouth every 4 (four) hours as needed for moderate pain (pain  score 4-6) (pain score 4-6). 30 tablet 0   promethazine (PHENERGAN) 25 MG tablet Take 1 tablet (25 mg total) by mouth every 8 (eight) hours as needed for nausea or vomiting. 30 tablet 0   rivaroxaban (XARELTO) 10 MG TABS tablet Take 1 tablet (10 mg total) by mouth daily. 30 tablet 0   rosuvastatin (CRESTOR) 20 MG tablet TAKE 1 TABLET BY MOUTH EVERY DAY 90 tablet 1   sulfamethoxazole-trimethoprim (BACTRIM DS) 800-160 MG tablet Take 2 tablets by mouth 2 (two) times daily. 120 tablet 1   TRESIBA FLEXTOUCH 100 UNIT/ML SOPN FlexTouch  Pen Inject 90 Units into the skin daily.     Facility-Administered Medications Prior to Visit  Medication Dose Route Frequency Provider Last Rate Last Admin   alteplase (ACTIVASE) injection 2 mg  2 mg Intravenous Once          Social History   Socioeconomic History   Marital status: Married    Spouse name: Not on file   Number of children: 1   Years of education: Not on file   Highest education level: Not on file  Occupational History   Occupation: pastor  Tobacco Use   Smoking status: Never   Smokeless tobacco: Never  Vaping Use   Vaping status: Never Used  Substance and Sexual Activity   Alcohol use: No   Drug use: No   Sexual activity: Yes  Other Topics Concern   Not on file  Social History Narrative   Renato Gails   Married to [employer]   Daughter, Abby Junious Dresser, RN III, Resides in [place]   Social Drivers of Health   Financial Resource Strain: Low Risk  (11/12/2021)   Overall Financial Resource Strain (CARDIA)    Difficulty of Paying Living Expenses: Not hard at all  Food Insecurity: No Food Insecurity (03/25/2023)   Hunger Vital Sign    Worried About Running Out of Food in the Last Year: Never true    Ran Out of Food in the Last Year: Never true  Transportation Needs: No Transportation Needs (03/25/2023)   PRAPARE - Administrator, Civil Service (Medical): No    Lack of Transportation (Non-Medical): No  Physical Activity: Not on file  Stress: No Stress Concern Present (09/01/2020)   [employer] of Occupational Health - Occupational Stress Questionnaire    Feeling of Stress : Not at all  Social Connections: Unknown (03/26/2023)   Social Connection and Isolation Panel [NHANES]    Frequency of Communication with Friends and Family: Patient declined    Frequency of Social Gatherings with Friends and Family: Patient declined    Attends Religious Services: Not on Insurance claims handler of Clubs or Organizations: Patient declined    Attends Tax inspector Meetings: Patient unable to answer    Marital Status: Patient declined  Intimate Partner Violence: Not At Risk (03/25/2023)   Humiliation, Afraid, Rape, and Kick questionnaire    Fear of Current or Ex-Partner: No    Emotionally Abused: No    Physically Abused: No    Sexually Abused: No      Review of Systems    All other ros negative  Objective:    BP 135/81   Pulse 79   Temp 97.9 F (36.6 C) (Temporal)   Resp 16   Ht 5\' 9"  (1.753 m)   Wt 195 lb 3.2 oz (88.5 kg)   SpO2 98%   BMI 28.83 kg/m  Nursing note and vital signs reviewed.  Physical Exam     General/constitutional: no distress, pleasant HEENT: Normocephalic, PER, Conj Clear, EOMI, Oropharynx clear Neck supple CV: rrr no mrg Lungs: clear to auscultation, normal respiratory effort Abd: Soft, Nontender Ext: no edema Skin: No Rash Neuro: nonfocal MSK: left knee incision healed; no erythema/tenderness; good rom; slight swelling left lower ext chronically stable; slightly warmer left knee than right  Labs: See a&p  Micro:  Serology:  Imaging:  Assessment & Plan:   Problem List Items Addressed This Visit   None Visit Diagnoses       Infection of prosthetic joint, subsequent encounter    -  Primary           Antibiotics:  Vancomycin 1/3-04/28/23 Rifampin 1/3-1/23   Lines/Hardware: Right Upper Ext Picc   Assessment 33 Y O male with HTN, HLD, GERD, DM, Prostate ca, complicated Left knee PJI, here for follow up       # Recurrent Left knee PJI ( MSSA, MRSA, MRSE) - 03/25/23 S/p open synovecytomy with I and D and polyliner exchange. Findings: Gross purulence involving the left knee tibial revision component, Gram stain and cultures pending of fluid and soft tissue as well as tissue sent to pathology to evaluate inflammatory cells per high-power field. OR cx MRSE - Of note, he was able to tolerate Rifampin partially when he had PJI in 2022.  And decision to retreat with combination  therapy with rifampin/vanc - Dr Elinor Parkinson previously reviewed with ID pharm --> VTE prophylaxis ( Rivaroxaban , dose increased from 10 mg daily to 20 mg daily) in short term - plan for 6 weeks iv then oral suppression possibly indefinitely given multiple recurrences - daptomycin and omadacycline sensitivity to [facility].     At this time 6 weeks planned therapy to end on 05/04/22   ------ 04/14/23 id clinic visit Patient reports flu like sx (body aches, decreased appetite, "skin crawling", since starting rifampin. Similar sx in the past with rifampin Noted pharmacy note to decrease xarelto back to 10 mg if rifampin stopped  If dress is pressent might have to use steroid  No opat labs for me to review recently  Will get cbc/cmp today to evaluate for possible adverse drug reaction  In the mean time will stop rifampin for now  Decrease xarelto back to 10 mg  F/u 2 weeks with dr Elinor Parkinson  ------------------- 04/28/23 id clinic assessment Opat labs 1/27 cbc 6/12/308 (no diff); cr 0.9; lft 35/34/231/0.3 Esr 47 Crp 7  Discussed need aggressive initial 3 months treatment and then lower dose suppression medication Given 3 recurrence I suggest to potentially indefinitely suppress   Reviewed mrsa, mssa, mrse susceptibility and bactrim currently the only oral med working; most recently mrse   Today, start bactrim 2 double strength tablet twice a day for 6 weeks, then 1 ds tablet bid there after  Keep picc in until we know patient tolerate bactrim  See me next week and will plan on removing picc in clinic if tolerate bactrim   Renal function test next week   05/05/23 id clinic assessment Opat labs 05/02/23 - cbc 6/11/209; normal eosinophil; cr 1.24; k 4.7; lft normal; sed rate 42; crp 13 04/28/23 - cr 1.12; k 4.3 04/25/23 - cbc 4.4/11/248; normal eos; cr 1.1; potassium 4.0; lft 4/20/128/0.3; sed rate 45; crp 8 Stable esr since switching to bactrim Clinically appear no worsening or  breakthrough infection Picc removed in clinic today Continue bactrim ds 2 tab bid Again given recurrent pji, will keep  on bactrim probably indefinitely or until he can't tolerate, but at least 6 months.  At 3 months into tx, possibly can go down to 1 tab bid if clinically well controlled/suppressed F/u 2 weeks for kidney function check. Cr increase a few days ago from baseline; potassium level 4.7  Follow-up: Return in about 2 weeks (around 05/19/2023).   Raymondo Band, MD [facility] 05/05/2023, 9:23 AM

## 2023-05-06 ENCOUNTER — Telehealth: Payer: Self-pay

## 2023-05-06 NOTE — Telephone Encounter (Signed)
PICC line removed at appointment on 05/05/2023 by Dr.Vu.   Message sent to [facility].  Yamel Bale Lesli Albee, CMA

## 2023-05-09 ENCOUNTER — Encounter: Payer: PPO | Admitting: Physician Assistant

## 2023-05-17 ENCOUNTER — Encounter: Payer: Self-pay | Admitting: Infectious Disease

## 2023-05-17 DIAGNOSIS — A498 Other bacterial infections of unspecified site: Secondary | ICD-10-CM | POA: Insufficient documentation

## 2023-05-17 DIAGNOSIS — A4902 Methicillin resistant Staphylococcus aureus infection, unspecified site: Secondary | ICD-10-CM | POA: Insufficient documentation

## 2023-05-17 HISTORY — DX: Other bacterial infections of unspecified site: A49.8

## 2023-05-17 HISTORY — DX: Methicillin resistant Staphylococcus aureus infection, unspecified site: A49.02

## 2023-05-17 NOTE — Progress Notes (Deleted)
 Subjective:  Chief complaint: follow-up for chronic, recurrent prosthetic knee infection   Patient ID: [name], male    DOB: [birth year], 68 y.o.   MRN: [id number]  HPI  34 Y O male with HTN, HLD, GERD, DM, Prostate ca, Left TKA in 02/2019 complicated with PJI s/p DAIR in 07/2020 with cx + for MSSA treated with IV cefazolin for 6 weeks ( + Rifampin given only partially due to nausea) followed by PO cefadroxil, further complicated with another Left knee PJI s/p excision arthroplasty with removal of hardware and abtx spacer placement 05/15/21 Cx MRSA, s/p IV cefazolin> Vancomycin after MRSA isolation on 05/21/21> daptomycin, EOT 07/02/21, followed by removal of antibiotic spacer, and 2 stage revision who was admitted due to recurrent effusions and swelling of left knee for concerns for PJI. He had an aspiration done of the left knee 12/16/21 ( 5K wbc, neutrophilic, cx NG and 01/13/23 less than 3K WBC, neutrophilic,)  03/25/23 S/p open synovecytomy with I and D and polyliner exchange. Findings: Gross purulence involving the left knee tibial revision component, Gram stain and cultures of fluid and soft tissue as well as tissue sent to pathology to evaluate inflammatory cells per high-power field. OR cx MRSE. He was treated with vancomycin and then rifampin but had trouble tolerating the latter again. He has now been placed on high dose bactrim 2 DS BID.    Past Medical History:  Diagnosis Date   Arthritis    Diabetes mellitus without complication (HCC) 2019   Diabetes Type II   GERD (gastroesophageal reflux disease)    diet controlled   HTN (hypertension) 10/27/2018   Hyperlipidemia    Internal hemorrhoids 2014   noted on colonoscopy   MSSA (methicillin susceptible Staphylococcus aureus) infection 09/03/2020   Osteoarthritis of both knees    Prostate cancer (HCC)    prostate   Sigmoid diverticulosis 2014   Mild, noted on colonoscopy   Skin lesion 01/01/2021   Type 2 diabetes mellitus with  other specified complication (HCC) 01/01/2021   Vaccine counseling 05/04/2021    Past Surgical History:  Procedure Laterality Date   CARPAL TUNNEL RELEASE Right 02/22/2019   Procedure: CARPAL TUNNEL RELEASE;  Surgeon: Vickki Hearing, MD;  Location: [facility];  Service: Orthopedics;  Laterality: Right;   COLONOSCOPY N/A 09/13/2012   Procedure: COLONOSCOPY;  Surgeon: West Bali, MD;  Location: [facility];  Service: Endoscopy;  Laterality: N/A;  9:30 AM   CYSTOSCOPY WITH URETHRAL DILATATION N/A 06/02/2020   Procedure: CYSTOSCOPY WITH URETHRAL DILATATION;  Surgeon: Heloise Purpura, MD;  Location: [facility];  Service: Urology;  Laterality: N/A;  ONLY NEEDS 30 MIN   EXCISIONAL TOTAL KNEE ARTHROPLASTY WITH ANTIBIOTIC SPACERS Left 05/15/2021   Procedure: LEFT TOTAL KNEE IRRIGATION AND DEBRIDEMENT, EXCISION LEFT TOTAL KNEE ARTHROPLASTY WITH ANTIBIOTIC SPACERS;  Surgeon: Kathryne Hitch, MD;  Location: [facility];  Service: Orthopedics;  Laterality: Left;   HERNIA REPAIR     20 years ago   I & D KNEE WITH POLY EXCHANGE Left 08/14/2020   Procedure: IRRIGATION AND DEBRIDEMENT LEFT KNEE with synovectomy WITH POLY EXCHANGE;  Surgeon: Kathryne Hitch, MD;  Location: [facility];  Service: Orthopedics;  Laterality: Left;   I & D KNEE WITH POLY EXCHANGE Left 03/25/2023   Procedure: INCISION AND DRAINAGE LEFT KNEE, SYNOVECTOMY,  POLY-LINER EXCHANGE;  Surgeon: Kathryne Hitch, MD;  Location: [facility];  Service: Orthopedics;  Laterality: Left;   KNEE ARTHROSCOPY Left    15 years  ago   LYMPHADENECTOMY Bilateral 05/30/2017   Procedure: LYMPHADENECTOMY, PELVIC EXTENDED;  Surgeon: Heloise Purpura, MD;  Location: [facility];  Service: Urology;  Laterality: Bilateral;   ROBOT ASSISTED LAPAROSCOPIC RADICAL PROSTATECTOMY N/A 05/30/2017   Procedure: XI ROBOTIC ASSISTED LAPAROSCOPIC RADICAL PROSTATECTOMY LEVEL 3;  Surgeon: Heloise Purpura, MD;  Location: [facility];  Service: Urology;  Laterality: N/A;   SHOULDER  ARTHROSCOPY Right    15 years ago   TOTAL KNEE ARTHROPLASTY Left 03/21/2019   Procedure: LEFT TOTAL KNEE ARTHROPLASTY;  Surgeon: Kathryne Hitch, MD;  Location: [facility];  Service: Orthopedics;  Laterality: Left;   TOTAL KNEE REVISION Left 08/14/2021   Procedure: REMOVAL OF ANTIBIOTIC SPACER AND LEFT TOTAL KNEE REVISION ARTHROPLASTY;  Surgeon: Kathryne Hitch, MD;  Location: [facility];  Service: Orthopedics;  Laterality: Left;    Family History  Problem Relation Age of Onset   Cancer Mother        small cell lung      Social History   Socioeconomic History   Marital status: Married    Spouse name: Not on file   Number of children: 1   Years of education: Not on file   Highest education level: Not on file  Occupational History   Occupation: pastor  Tobacco Use   Smoking status: Never   Smokeless tobacco: Never  Vaping Use   Vaping status: Never Used  Substance and Sexual Activity   Alcohol use: No   Drug use: No   Sexual activity: Yes  Other Topics Concern   Not on file  Social History Narrative   Renato Gails   Married to [employer]   Daughter, Abby Junious Dresser, RN III, Resides in [place]   Social Drivers of Health   Financial Resource Strain: Low Risk  (11/12/2021)   Overall Financial Resource Strain (CARDIA)    Difficulty of Paying Living Expenses: Not hard at all  Food Insecurity: No Food Insecurity (03/25/2023)   Hunger Vital Sign    Worried About Running Out of Food in the Last Year: Never true    Ran Out of Food in the Last Year: Never true  Transportation Needs: No Transportation Needs (03/25/2023)   PRAPARE - Administrator, Civil Service (Medical): No    Lack of Transportation (Non-Medical): No  Physical Activity: Not on file  Stress: No Stress Concern Present (09/01/2020)   [employer] of Occupational Health - Occupational Stress Questionnaire    Feeling of Stress : Not at all  Social Connections: Unknown (03/26/2023)   Social  Connection and Isolation Panel [NHANES]    Frequency of Communication with Friends and Family: Patient declined    Frequency of Social Gatherings with Friends and Family: Patient declined    Attends Religious Services: Not on Marketing executive or Organizations: Patient declined    Attends Banker Meetings: Patient unable to answer    Marital Status: Patient declined    Allergies  Allergen Reactions   Rifampin Nausea Only and Other (See Comments)    Skin crawling- bodyache     Current Outpatient Medications:    aspirin 81 MG chewable tablet, Chew 1 tablet (81 mg total) by mouth 2 (two) times daily. (Patient not taking: Reported on 04/14/2023), Disp: 60 tablet, Rfl: 0   chlorhexidine (HIBICLENS) 4 % external liquid, Apply 15 mLs (1 Application total) topically as directed for 30 doses. Use as directed daily for 5 days every other week for 6 weeks. (  Patient not taking: Reported on 04/14/2023), Disp: 946 mL, Rfl: 1   Continuous Blood Gluc Sensor (FREESTYLE LIBRE 14 DAY SENSOR) MISC, SMARTSIG:Topical Every 10 Days, Disp: , Rfl:    escitalopram (LEXAPRO) 10 MG tablet, Take 10 mg by mouth daily., Disp: , Rfl:    insulin aspart (NOVOLOG) 100 UNIT/ML injection, Inject 10-24 Units into the skin 3 (three) times daily with meals. Per sliding scale, Disp: , Rfl:    losartan-hydrochlorothiazide (HYZAAR) 50-12.5 MG tablet, TAKE (1) TABLET BY MOUTH ONCE DAILY., Disp: 30 tablet, Rfl: 0   oxyCODONE (OXY IR/ROXICODONE) 5 MG immediate release tablet, Take 1-2 tablets (5-10 mg total) by mouth every 4 (four) hours as needed for moderate pain (pain score 4-6) (pain score 4-6)., Disp: 30 tablet, Rfl: 0   promethazine (PHENERGAN) 25 MG tablet, Take 1 tablet (25 mg total) by mouth every 8 (eight) hours as needed for nausea or vomiting., Disp: 30 tablet, Rfl: 0   rivaroxaban (XARELTO) 10 MG TABS tablet, Take 1 tablet (10 mg total) by mouth daily., Disp: 30 tablet, Rfl: 0   rosuvastatin  (CRESTOR) 20 MG tablet, TAKE 1 TABLET BY MOUTH EVERY DAY, Disp: 90 tablet, Rfl: 1   sulfamethoxazole-trimethoprim (BACTRIM DS) 800-160 MG tablet, Take 2 tablets by mouth 2 (two) times daily., Disp: 120 tablet, Rfl: 1   TRESIBA FLEXTOUCH 100 UNIT/ML SOPN FlexTouch Pen, Inject 90 Units into the skin daily., Disp: , Rfl:   Current Facility-Administered Medications:    alteplase (ACTIVASE) injection 2 mg, 2 mg, Intravenous, Once,    Review of Systems     Objective:   Physical Exam        Assessment & Plan:

## 2023-05-18 ENCOUNTER — Ambulatory Visit: Payer: PPO | Admitting: Infectious Disease

## 2023-05-18 ENCOUNTER — Telehealth: Payer: Self-pay

## 2023-05-18 DIAGNOSIS — A4902 Methicillin resistant Staphylococcus aureus infection, unspecified site: Secondary | ICD-10-CM

## 2023-05-18 DIAGNOSIS — A498 Other bacterial infections of unspecified site: Secondary | ICD-10-CM

## 2023-05-18 DIAGNOSIS — A4901 Methicillin susceptible Staphylococcus aureus infection, unspecified site: Secondary | ICD-10-CM

## 2023-05-18 DIAGNOSIS — T8454XD Infection and inflammatory reaction due to internal left knee prosthesis, subsequent encounter: Secondary | ICD-10-CM

## 2023-05-18 DIAGNOSIS — Z7185 Encounter for immunization safety counseling: Secondary | ICD-10-CM

## 2023-05-18 NOTE — Telephone Encounter (Signed)
 Called patient to reschedule missed appointment. no answer left voice message to contact office.

## 2023-05-22 NOTE — Progress Notes (Unsigned)
 Subjective:  Chief complaint: follow-up for chronic, recurrent prosthetic knee infection   Patient ID: [name], male    DOB: January 18, 1956, 69 y.o.   MRN: [id number]  HPI  85 Y O male with HTN, HLD, GERD, DM, Prostate ca, Left TKA in 02/2019 complicated with PJI s/p DAIR in 07/2020 with cx + for MSSA treated with IV cefazolin for 6 weeks ( + Rifampin given only partially due to nausea) followed by PO cefadroxil, further complicated with another Left knee PJI s/p excision arthroplasty with removal of hardware and abtx spacer placement 05/15/21 Cx MRSA, s/p IV cefazolin> Vancomycin after MRSA isolation on 05/21/21> daptomycin, EOT 07/02/21, followed by removal of antibiotic spacer, and 2 stage revision who was admitted due to recurrent effusions and swelling of left knee for concerns for PJI. He had an aspiration done of the left knee 12/16/21 ( 5K wbc, neutrophilic, cx NG and 01/13/23 less than 3K WBC, neutrophilic,)  03/25/23 S/p open synovecytomy with I and D and polyliner exchange. Findings: Gross purulence involving the left knee tibial revision component, Gram stain and cultures of fluid and soft tissue as well as tissue sent to pathology to evaluate inflammatory cells per high-power field. OR cx MRSE. He was treated with vancomycin and then rifampin but had trouble tolerating the latter again. He has now been placed on high dose bactrim 2 DS BID.  Discussed the use of AI scribe software for clinical note transcription with the patient, who gave verbal consent to proceed.  History of Present Illness   The patient, with a history of multiple knee surgeries due to infections, presents with concerns about the recent knee infection and the side effects of the current antibiotic treatment. The patient had a prosthetic joint infection with MSSA, then MRSA and then MR Staphylococcus epidermidis, which was resistant to many antibiotics He had I and D and polyexchange  on 03/25/2023 with vancomycin and  rifampin--until tolerability issues arose and he went back on Bactrim       Past Medical History:  Diagnosis Date   Arthritis    Diabetes mellitus without complication (HCC) 2019   Diabetes Type II   GERD (gastroesophageal reflux disease)    diet controlled   HTN (hypertension) 10/27/2018   Hyperlipidemia    Internal hemorrhoids 2014   noted on colonoscopy   MRSA infection 05/17/2023   MSSA (methicillin susceptible Staphylococcus aureus) infection 09/03/2020   Osteoarthritis of both knees    Prostate cancer (HCC)    prostate   Sigmoid diverticulosis 2014   Mild, noted on colonoscopy   Skin lesion 01/01/2021   Staphylococcus epidermidis infection 05/17/2023   Type 2 diabetes mellitus with other specified complication (HCC) 01/01/2021   Vaccine counseling 05/04/2021    Past Surgical History:  Procedure Laterality Date   CARPAL TUNNEL RELEASE Right 02/22/2019   Procedure: CARPAL TUNNEL RELEASE;  Surgeon: Vickki Hearing, MD;  Location: [facility];  Service: Orthopedics;  Laterality: Right;   COLONOSCOPY N/A 09/13/2012   Procedure: COLONOSCOPY;  Surgeon: West Bali, MD;  Location: [facility];  Service: Endoscopy;  Laterality: N/A;  9:30 AM   CYSTOSCOPY WITH URETHRAL DILATATION N/A 06/02/2020   Procedure: CYSTOSCOPY WITH URETHRAL DILATATION;  Surgeon: Heloise Purpura, MD;  Location: [facility];  Service: Urology;  Laterality: N/A;  ONLY NEEDS 30 MIN   EXCISIONAL TOTAL KNEE ARTHROPLASTY WITH ANTIBIOTIC SPACERS Left 05/15/2021   Procedure: LEFT TOTAL KNEE IRRIGATION AND DEBRIDEMENT, EXCISION LEFT TOTAL KNEE ARTHROPLASTY WITH ANTIBIOTIC SPACERS;  Surgeon: Kathryne Hitch, MD;  Location: [facility];  Service: Orthopedics;  Laterality: Left;   HERNIA REPAIR     20 years ago   I & D KNEE WITH POLY EXCHANGE Left 08/14/2020   Procedure: IRRIGATION AND DEBRIDEMENT LEFT KNEE with synovectomy WITH POLY EXCHANGE;  Surgeon: Kathryne Hitch, MD;  Location: [facility];  Service: Orthopedics;   Laterality: Left;   I & D KNEE WITH POLY EXCHANGE Left 03/25/2023   Procedure: INCISION AND DRAINAGE LEFT KNEE, SYNOVECTOMY,  POLY-LINER EXCHANGE;  Surgeon: Kathryne Hitch, MD;  Location: [facility];  Service: Orthopedics;  Laterality: Left;   KNEE ARTHROSCOPY Left    15 years ago   LYMPHADENECTOMY Bilateral 05/30/2017   Procedure: LYMPHADENECTOMY, PELVIC EXTENDED;  Surgeon: Heloise Purpura, MD;  Location: [facility];  Service: Urology;  Laterality: Bilateral;   ROBOT ASSISTED LAPAROSCOPIC RADICAL PROSTATECTOMY N/A 05/30/2017   Procedure: XI ROBOTIC ASSISTED LAPAROSCOPIC RADICAL PROSTATECTOMY LEVEL 3;  Surgeon: Heloise Purpura, MD;  Location: [facility];  Service: Urology;  Laterality: N/A;   SHOULDER ARTHROSCOPY Right    15 years ago   TOTAL KNEE ARTHROPLASTY Left 03/21/2019   Procedure: LEFT TOTAL KNEE ARTHROPLASTY;  Surgeon: Kathryne Hitch, MD;  Location: [facility];  Service: Orthopedics;  Laterality: Left;   TOTAL KNEE REVISION Left 08/14/2021   Procedure: REMOVAL OF ANTIBIOTIC SPACER AND LEFT TOTAL KNEE REVISION ARTHROPLASTY;  Surgeon: Kathryne Hitch, MD;  Location: [facility];  Service: Orthopedics;  Laterality: Left;    Family History  Problem Relation Age of Onset   Cancer Mother        small cell lung      Social History   Socioeconomic History   Marital status: Married    Spouse name: Not on file   Number of children: 1   Years of education: Not on file   Highest education level: Not on file  Occupational History   Occupation: pastor  Tobacco Use   Smoking status: Never   Smokeless tobacco: Never  Vaping Use   Vaping status: Never Used  Substance and Sexual Activity   Alcohol use: No   Drug use: No   Sexual activity: Yes  Other Topics Concern   Not on file  Social History Narrative   Renato Gails   Married to [employer]   Daughter, Abby Junious Dresser, RN III, Resides in [place]   Social Drivers of Health   Financial Resource Strain: Low Risk  (11/12/2021)    Overall Financial Resource Strain (CARDIA)    Difficulty of Paying Living Expenses: Not hard at all  Food Insecurity: No Food Insecurity (03/25/2023)   Hunger Vital Sign    Worried About Running Out of Food in the Last Year: Never true    Ran Out of Food in the Last Year: Never true  Transportation Needs: No Transportation Needs (03/25/2023)   PRAPARE - Administrator, Civil Service (Medical): No    Lack of Transportation (Non-Medical): No  Physical Activity: Not on file  Stress: No Stress Concern Present (09/01/2020)   [employer] of Occupational Health - Occupational Stress Questionnaire    Feeling of Stress : Not at all  Social Connections: Unknown (03/26/2023)   Social Connection and Isolation Panel [NHANES]    Frequency of Communication with Friends and Family: Patient declined    Frequency of Social Gatherings with Friends and Family: Patient declined    Attends Religious Services: Not on Insurance claims handler of Clubs or  Organizations: Patient declined    Attends Banker Meetings: Patient unable to answer    Marital Status: Patient declined    Allergies  Allergen Reactions   Rifampin Nausea Only and Other (See Comments)    Skin crawling- bodyache     Current Outpatient Medications:    aspirin 81 MG chewable tablet, Chew 1 tablet (81 mg total) by mouth 2 (two) times daily. (Patient not taking: Reported on 04/14/2023), Disp: 60 tablet, Rfl: 0   chlorhexidine (HIBICLENS) 4 % external liquid, Apply 15 mLs (1 Application total) topically as directed for 30 doses. Use as directed daily for 5 days every other week for 6 weeks. (Patient not taking: Reported on 04/14/2023), Disp: 946 mL, Rfl: 1   Continuous Blood Gluc Sensor (FREESTYLE LIBRE 14 DAY SENSOR) MISC, SMARTSIG:Topical Every 10 Days, Disp: , Rfl:    escitalopram (LEXAPRO) 10 MG tablet, Take 10 mg by mouth daily., Disp: , Rfl:    insulin aspart (NOVOLOG) 100 UNIT/ML injection, Inject 10-24 Units into  the skin 3 (three) times daily with meals. Per sliding scale, Disp: , Rfl:    losartan-hydrochlorothiazide (HYZAAR) 50-12.5 MG tablet, TAKE (1) TABLET BY MOUTH ONCE DAILY., Disp: 30 tablet, Rfl: 0   oxyCODONE (OXY IR/ROXICODONE) 5 MG immediate release tablet, Take 1-2 tablets (5-10 mg total) by mouth every 4 (four) hours as needed for moderate pain (pain score 4-6) (pain score 4-6)., Disp: 30 tablet, Rfl: 0   promethazine (PHENERGAN) 25 MG tablet, Take 1 tablet (25 mg total) by mouth every 8 (eight) hours as needed for nausea or vomiting., Disp: 30 tablet, Rfl: 0   rivaroxaban (XARELTO) 10 MG TABS tablet, Take 1 tablet (10 mg total) by mouth daily., Disp: 30 tablet, Rfl: 0   rosuvastatin (CRESTOR) 20 MG tablet, TAKE 1 TABLET BY MOUTH EVERY DAY, Disp: 90 tablet, Rfl: 1   sulfamethoxazole-trimethoprim (BACTRIM DS) 800-160 MG tablet, Take 2 tablets by mouth 2 (two) times daily., Disp: 120 tablet, Rfl: 1   TRESIBA FLEXTOUCH 100 UNIT/ML SOPN FlexTouch Pen, Inject 90 Units into the skin daily., Disp: , Rfl:   Current Facility-Administered Medications:    alteplase (ACTIVASE) injection 2 mg, 2 mg, Intravenous, Once,    Review of Systems  Constitutional:  Negative for activity change, appetite change, chills, diaphoresis, fatigue, fever and unexpected weight change.  HENT:  Negative for congestion, rhinorrhea, sinus pressure, sneezing, sore throat and trouble swallowing.   Eyes:  Negative for photophobia and visual disturbance.  Respiratory:  Negative for cough, chest tightness, shortness of breath, wheezing and stridor.   Cardiovascular:  Negative for chest pain, palpitations and leg swelling.  Gastrointestinal:  Negative for abdominal distention, abdominal pain, anal bleeding, blood in stool, constipation, diarrhea, nausea and vomiting.  Genitourinary:  Negative for difficulty urinating, dysuria, flank pain and hematuria.  Musculoskeletal:  Positive for arthralgias. Negative for back pain, gait  problem, joint swelling and myalgias.  Skin:  Negative for color change, pallor, rash and wound.  Neurological:  Negative for dizziness, tremors, weakness and light-headedness.  Hematological:  Negative for adenopathy. Does not bruise/bleed easily.  Psychiatric/Behavioral:  Negative for agitation, behavioral problems, confusion, decreased concentration, dysphoric mood and sleep disturbance.        Objective:   Physical Exam Constitutional:      Appearance: He is well-developed.  HENT:     Head: Normocephalic and atraumatic.  Eyes:     Conjunctiva/sclera: Conjunctivae normal.  Cardiovascular:     Rate and Rhythm: Normal rate and regular  rhythm.  Pulmonary:     Effort: Pulmonary effort is normal. No respiratory distress.     Breath sounds: No wheezing.  Abdominal:     General: There is no distension.     Palpations: Abdomen is soft.  Musculoskeletal:        General: No tenderness. Normal range of motion.     Cervical back: Normal range of motion and neck supple.  Skin:    General: Skin is warm and dry.     Coloration: Skin is not pale.     Findings: No erythema or rash.  Neurological:     General: No focal deficit present.     Mental Status: He is alert and oriented to person, place, and time.  Psychiatric:        Mood and Affect: Mood normal.        Behavior: Behavior normal.        Thought Content: Thought content normal.        Judgment: Judgment normal.           Assessment & Plan:   Assessment and Plan    Prosthetic Joint Infection History of multiple infections post knee replacement, most recently with Staphylococcus epidermidis. Currently on Bactrim, with good clinical response but potential for renal and electrolyte disturbances. -Check renal function and potassium levels today due to potential side effects of Bactrim. -IF labs reassuring Continue Bactrim at current dose (2 tablets twice daily) for the next two weeks, provided labs are within normal  limits. -Plan to reduce Bactrim to 1 tablet twice daily after two weeks but may need to make an intervention if we run into hyperkalemia, elevated creatinine, renal impairment -Consider long-term antibiotic therapy, potentially for at least a year or indefinitely, depending on clinical course and tolerance of medication.  Follow-up in two months, with Dr. Renold Don      Therapeutic drug monitoring: as above will check BMP, CBC and diff, will also check ESR and CRP to assess his PJI  I have personally spent 26 minutes involved in face-to-face and non-face-to-face activities for this patient on the day of the visit. Professional time spent includes the following activities: Preparing to see the patient (review of tests), Obtaining and/or reviewing separately obtained history (admission/discharge record), Performing a medically appropriate examination and/or evaluation , Ordering medications/tests/procedures, referring and communicating with other health care professionals, Documenting clinical information in the EMR, Independently interpreting results (not separately reported), Communicating results to the patient/family/caregiver, Counseling and educating the patient/family/caregiver and Care coordination (not separately reported).

## 2023-05-23 ENCOUNTER — Other Ambulatory Visit: Payer: Self-pay

## 2023-05-23 ENCOUNTER — Encounter: Payer: Self-pay | Admitting: Infectious Disease

## 2023-05-23 ENCOUNTER — Ambulatory Visit: Payer: PPO | Admitting: Infectious Disease

## 2023-05-23 VITALS — BP 135/79 | HR 75 | Ht 69.0 in | Wt 199.0 lb

## 2023-05-23 DIAGNOSIS — A4901 Methicillin susceptible Staphylococcus aureus infection, unspecified site: Secondary | ICD-10-CM

## 2023-05-23 DIAGNOSIS — Z23 Encounter for immunization: Secondary | ICD-10-CM

## 2023-05-23 DIAGNOSIS — T8454XD Infection and inflammatory reaction due to internal left knee prosthesis, subsequent encounter: Secondary | ICD-10-CM | POA: Diagnosis not present

## 2023-05-23 DIAGNOSIS — Z5181 Encounter for therapeutic drug level monitoring: Secondary | ICD-10-CM

## 2023-05-24 ENCOUNTER — Telehealth: Payer: Self-pay

## 2023-05-24 ENCOUNTER — Other Ambulatory Visit: Payer: Self-pay | Admitting: Infectious Disease

## 2023-05-24 LAB — CBC WITH DIFFERENTIAL/PLATELET
Absolute Lymphocytes: 1571 {cells}/uL (ref 850–3900)
Absolute Monocytes: 410 {cells}/uL (ref 200–950)
Basophils Absolute: 32 {cells}/uL (ref 0–200)
Basophils Relative: 0.6 %
Eosinophils Absolute: 157 {cells}/uL (ref 15–500)
Eosinophils Relative: 2.9 %
HCT: 37 % — ABNORMAL LOW (ref 38.5–50.0)
Hemoglobin: 12.3 g/dL — ABNORMAL LOW (ref 13.2–17.1)
MCH: 28.6 pg (ref 27.0–33.0)
MCHC: 33.2 g/dL (ref 32.0–36.0)
MCV: 86 fL (ref 80.0–100.0)
MPV: 8.8 fL (ref 7.5–12.5)
Monocytes Relative: 7.6 %
Neutro Abs: 3229 {cells}/uL (ref 1500–7800)
Neutrophils Relative %: 59.8 %
Platelets: 230 10*3/uL (ref 140–400)
RBC: 4.3 10*6/uL (ref 4.20–5.80)
RDW: 14.2 % (ref 11.0–15.0)
Total Lymphocyte: 29.1 %
WBC: 5.4 10*3/uL (ref 3.8–10.8)

## 2023-05-24 LAB — BASIC METABOLIC PANEL WITH GFR
BUN: 24 mg/dL (ref 7–25)
CO2: 26 mmol/L (ref 20–32)
Calcium: 9.9 mg/dL (ref 8.6–10.3)
Chloride: 100 mmol/L (ref 98–110)
Creat: 1.24 mg/dL (ref 0.70–1.35)
Glucose, Bld: 47 mg/dL — ABNORMAL LOW (ref 65–99)
Potassium: 4.1 mmol/L (ref 3.5–5.3)
Sodium: 136 mmol/L (ref 135–146)
eGFR: 64 mL/min/{1.73_m2} (ref >=60)

## 2023-05-24 LAB — SEDIMENTATION RATE: Sed Rate: 36 mm/h — ABNORMAL HIGH (ref 0–20)

## 2023-05-24 LAB — C-REACTIVE PROTEIN: CRP: 14.5 mg/L — ABNORMAL HIGH (ref <8.0)

## 2023-05-24 MED ORDER — SULFAMETHOXAZOLE-TRIMETHOPRIM 800-160 MG PO TABS: 1.0000 | ORAL_TABLET | Freq: Two times a day (BID) | ORAL | 11 refills | Status: DC

## 2023-05-24 NOTE — Telephone Encounter (Signed)
 Spoke with Christen Bame, relayed per Dr. Daiva Eves that lab work was stable, including creatinine and potassium.   Notified him that provider sent in prescription for DS Bactrim 1 tablet BID for him to start once he finishes the DS Bactrim 2 tablets BID.   Discussed stable ESR and mildly elevated CRP, but that Dr. Daiva Eves states he appears to be doing well from a clinical perspective. Patient verbalized understanding and has no further questions.   Sandie Ano, RN

## 2023-05-24 NOTE — Telephone Encounter (Signed)
 Patient wife called regarding lab results to see what they need to do about antibiotic.    Nimsi Males Lesli Albee, CMA

## 2023-05-24 NOTE — Telephone Encounter (Signed)
-----   Message from [place] sent at 05/24/2023 12:35 PM EST ----- Labs stable, cr and k fine. I sent in his 1 DS BID that he can start after the 2 DS BID. ESR is stable, CRP up slightly but clinically he seems well ----- Message ----- From: Interface, [facility] Lab Results In Sent: 05/23/2023   4:26 PM EST To: Randall Hiss, MD

## 2023-05-31 ENCOUNTER — Ambulatory Visit (INDEPENDENT_AMBULATORY_CARE_PROVIDER_SITE_OTHER): Payer: PPO | Admitting: Physician Assistant

## 2023-05-31 ENCOUNTER — Encounter: Payer: Self-pay | Admitting: Physician Assistant

## 2023-05-31 DIAGNOSIS — T8454XD Infection and inflammatory reaction due to internal left knee prosthesis, subsequent encounter: Secondary | ICD-10-CM

## 2023-05-31 MED ORDER — OXYCODONE HCL 5 MG PO TABS: 5.0000 mg | ORAL_TABLET | Freq: Four times a day (QID) | ORAL | 0 refills | Status: DC | PRN

## 2023-05-31 NOTE — Progress Notes (Signed)
 HPI: [name] returns today follow-up status post I&D left knee poly liner exchange on 03/25/2023 for infected left total knee.  He states he has achiness in the knee.  He is noted some swelling but feels this is overall improved.  He is really been just doing his daily activities and does not so if he is doing a lot of exercise.  He is taking oxycodone at night when he has the most pain.  Otherwise he describes his pain as dull achy.  He has been to infectious disease earlier this month.  He is on Bactrim orally and will be for at least a year if not indefinitely.  Review of systems: Denies any fevers or chills.  See HPI otherwise.  Physical exam: General Well-developed well-nourished pleasant male in no acute distress. Left knee: Full extension flexion to 85 degrees.  Surgical incisions well-healed.  There is very slight fluctuance mid incision but no expressible purulence.  No significant abnormal warmth or erythema about the knee.  There are areas of folliculitis bilateral lower legs.  Impression: Status post left knee I&D with poly exchange  Plan: Recommend the use of compression hose which she is already using.  Also periodic elevation throughout the day.  Continue to work on range of motion strengthening the leg.  He will continue to wash the leg with an antibacterial soap recommend also moisturizer to both legs.  Will see back in 3 weeks sooner if there is any questions concerns.  Questions were encouraged and answered.

## 2023-06-02 ENCOUNTER — Other Ambulatory Visit: Payer: Self-pay | Admitting: Cardiology

## 2023-06-02 DIAGNOSIS — I10 Essential (primary) hypertension: Secondary | ICD-10-CM

## 2023-06-02 NOTE — Telephone Encounter (Signed)
 Pharmacy is requesting medication for this pt; however, pt hasn't been seen since Sept, 2020. Does Dr. Jacinto Halim want to continue to refill this medication. Please advise. Thanks.

## 2023-06-02 NOTE — Telephone Encounter (Signed)
 I spoke with patient and he is following with PCP and does not feel he needs to see cardiology at this time.  I told patient message would be sent to his pharmacy to send refill to PCP I spoke with pharmacy and they will send prescription to Dr Felipa Eth

## 2023-06-06 ENCOUNTER — Ambulatory Visit (INDEPENDENT_AMBULATORY_CARE_PROVIDER_SITE_OTHER): Admitting: Physician Assistant

## 2023-06-06 ENCOUNTER — Encounter: Payer: Self-pay | Admitting: Physician Assistant

## 2023-06-06 DIAGNOSIS — T8454XD Infection and inflammatory reaction due to internal left knee prosthesis, subsequent encounter: Secondary | ICD-10-CM

## 2023-06-06 NOTE — Progress Notes (Signed)
 HPI: [name] returns today due to increased pain and decreased mobility and swelling of his left leg.  He states that his leg feels warm to touch.  Notes it was red yesterday.  He has had no fevers chills.  He is status post I&D left knee with poly liner exchange 03/25/2023 for an infected left total knee.  He continues oral antibiotics.  Physical exam: General Well-developed well-nourished male in no acute distress.  Ambulates with an antalgic gait no assistive device.  Left knee full extension flexion to approximately 80 to 85 degrees.  Surgical incisions healing well.  Folliculitis appears improved since last week.  No drainage or expressible purulence.  Impression: Status post I&D left knee with poly exchange Infected left total knee arthroplasty  Plan: Will refer him to the prosthetic joint infection center in [place].  He will continue oral antibiotics.  Follow-up with [place] in as needed or if his condition becomes worse.  He will continue to wash his legs with antibacterial soap and apply moisturizer.  Questions were encouraged and answered at length.

## 2023-06-07 ENCOUNTER — Other Ambulatory Visit: Payer: Self-pay | Admitting: Orthopaedic Surgery

## 2023-06-07 ENCOUNTER — Other Ambulatory Visit: Payer: Self-pay

## 2023-06-07 DIAGNOSIS — T8454XD Infection and inflammatory reaction due to internal left knee prosthesis, subsequent encounter: Secondary | ICD-10-CM

## 2023-06-08 IMAGING — DX DG KNEE 1-2V PORT*L*
2 series · 2 of 2 positions shown · non-contrast
Comparison: Left knee radiographs 12/31/2020

CLINICAL DATA: Status post removal of antibiotic spacer and status
post left total knee revision arthroplasty.

EXAM:
PORTABLE LEFT KNEE - 1-2 VIEW

[knee ap]
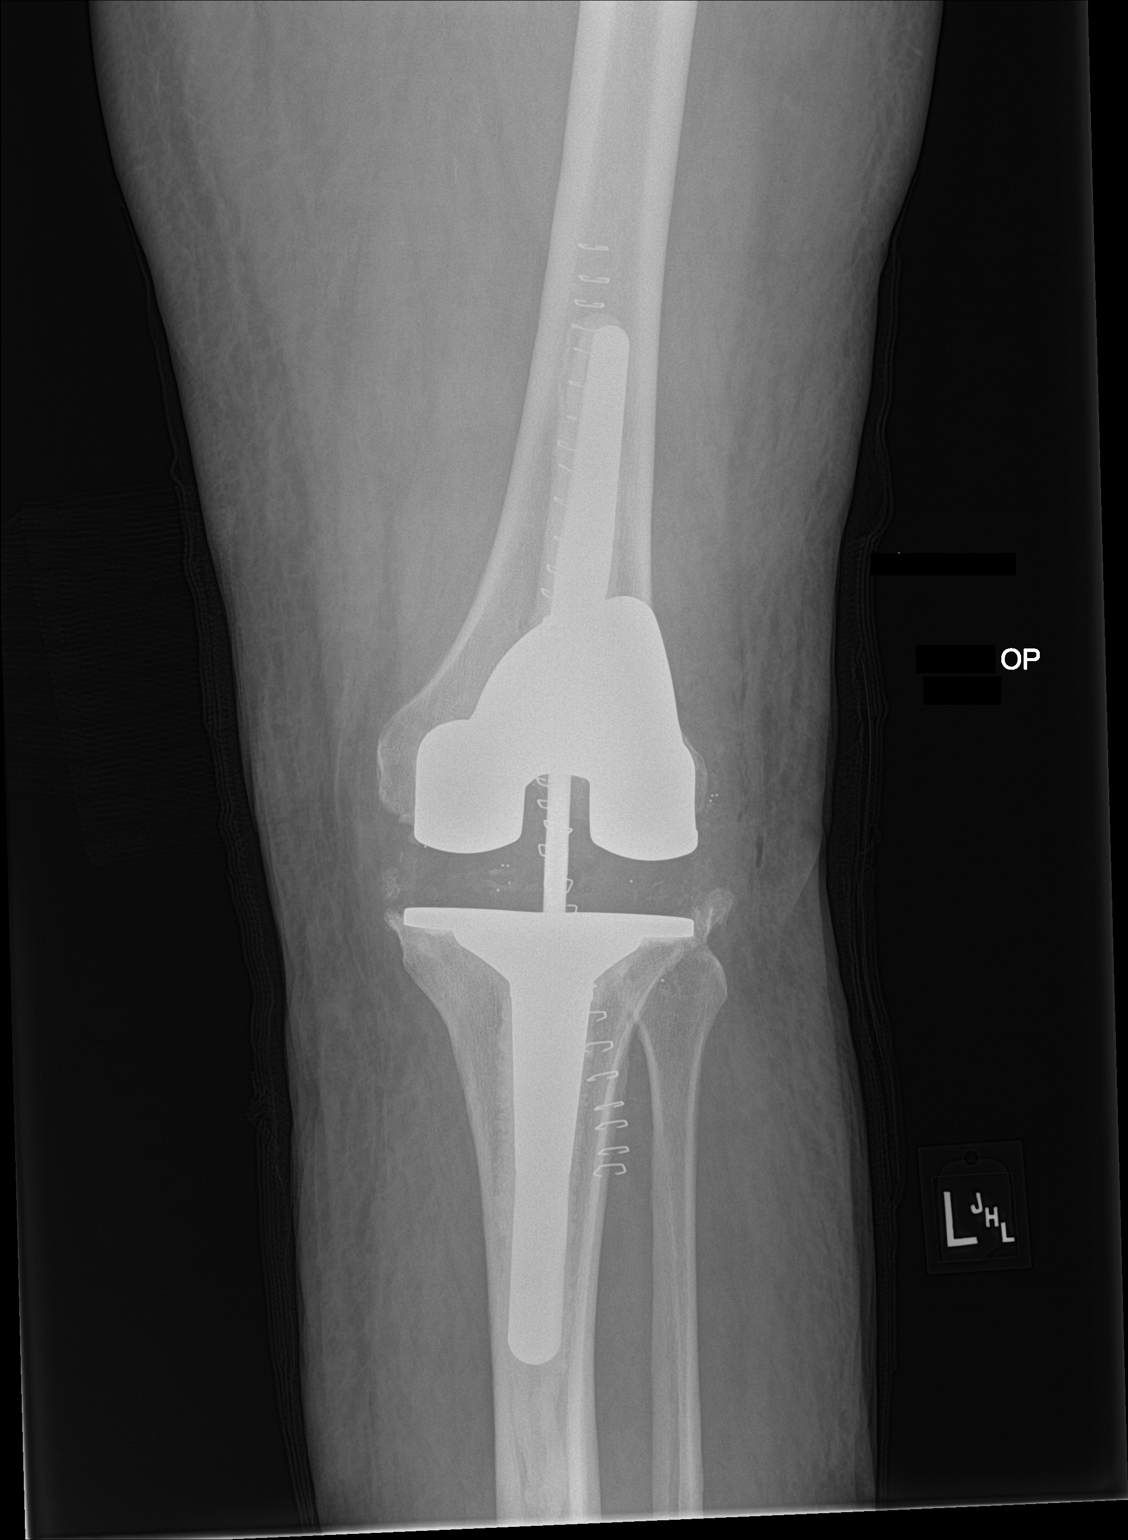

[knee lat]
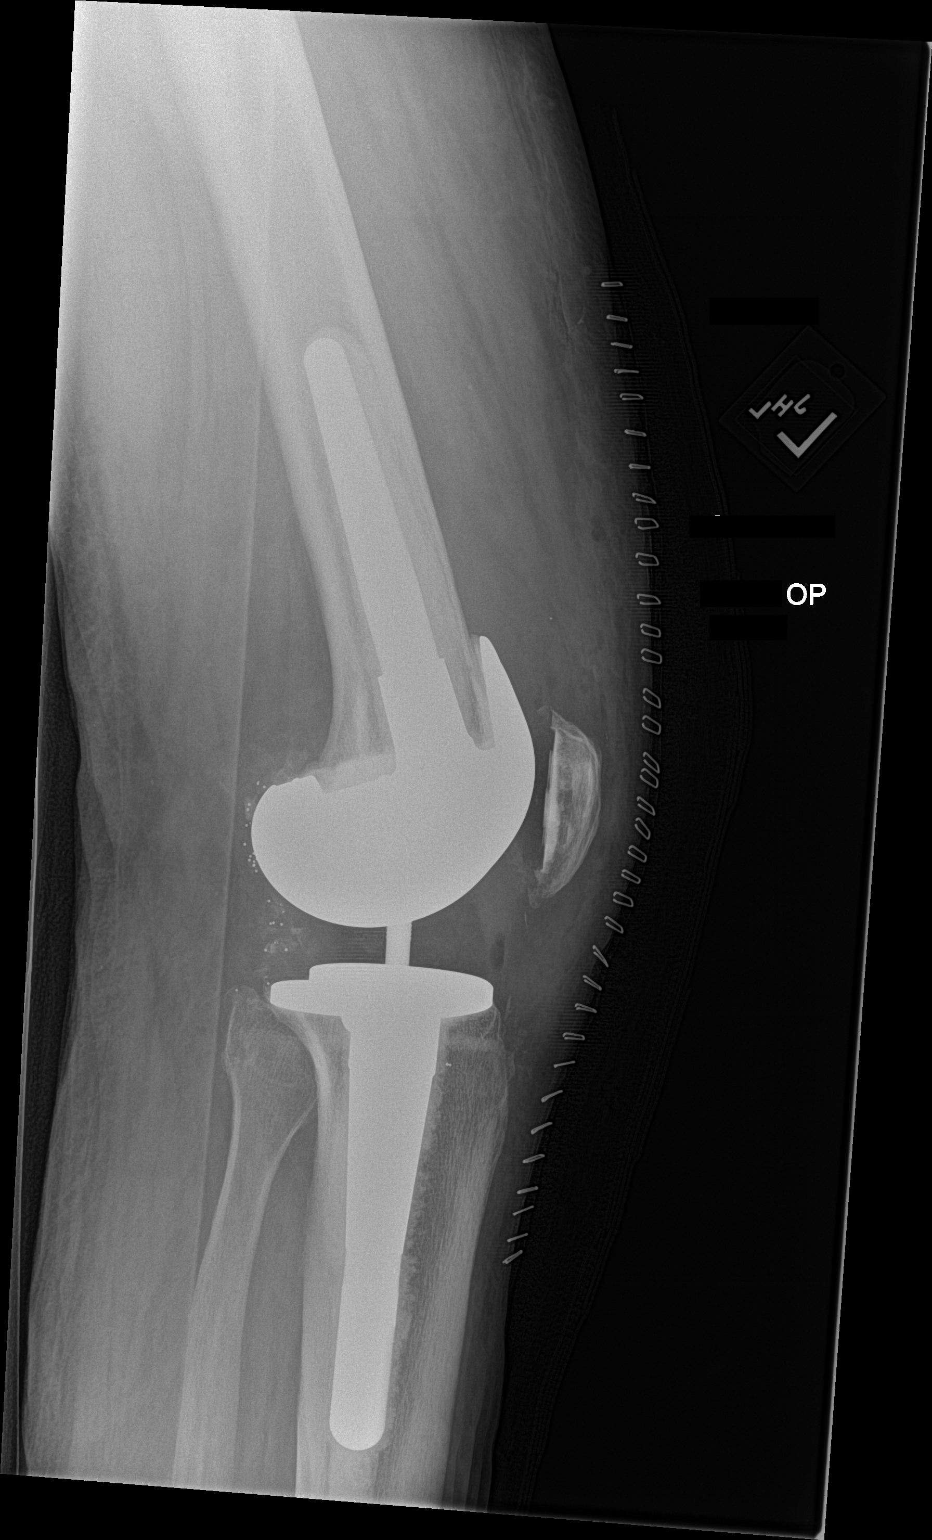

[2 of 2 positions shown; findings below may reference images not displayed]

FINDINGS: Interval removal of the prior total left knee arthroplasty hardware
and interval placement of new total left knee arthroplasty hardware
with longer femoral and tibial stems. No perihardware lucency is
seen to indicate hardware failure or loosening. Expected
postoperative changes including soft tissue swelling and mild
subcutaneous air. Anterior surgical skin staples. There are tiny
densities overlying the posterior knee joint on lateral view,
presumably prior antibiotic beads. No acute fracture or dislocation.
IMPRESSION: Interval revision of total left knee arthroplasty without evidence
of hardware failure.

## 2023-06-17 ENCOUNTER — Telehealth: Payer: Self-pay | Admitting: Orthopaedic Surgery

## 2023-06-17 NOTE — Telephone Encounter (Signed)
 Pt's wife [name] called requesting a call back from [facility]. Pt's [name] states they have been unsuccessful at getting an appt with infectious disease appt. She is asking for a call back asking maybe to be referral to another locations since they are having a hard time trying to contact [place] location. Please call [name] at  [phone].

## 2023-06-17 NOTE — Telephone Encounter (Signed)
 Patient wife aware I am going to re-fax to I&D in [place]

## 2023-06-21 ENCOUNTER — Encounter: Payer: Self-pay | Admitting: *Deleted

## 2023-06-22 ENCOUNTER — Telehealth: Payer: Self-pay | Admitting: Orthopaedic Surgery

## 2023-06-22 ENCOUNTER — Encounter: Admitting: Physician Assistant

## 2023-06-22 ENCOUNTER — Other Ambulatory Visit: Payer: Self-pay | Admitting: Physician Assistant

## 2023-06-22 MED ORDER — OXYCODONE HCL 5 MG PO TABS: 5.0000 mg | ORAL_TABLET | Freq: Four times a day (QID) | ORAL | 0 refills | Status: DC | PRN

## 2023-06-22 NOTE — Telephone Encounter (Signed)
 Pt called requesting refill of oxycodone. Lease send to [facility] [place]. Pt phone number is [phone].

## 2023-06-29 ENCOUNTER — Telehealth: Payer: Self-pay | Admitting: Orthopaedic Surgery

## 2023-06-29 NOTE — Telephone Encounter (Signed)
 Spoke with [facility] and told me to have her call them

## 2023-06-29 NOTE — Telephone Encounter (Signed)
 Patient called and gabby has not gotten back with them about the referral. CB#[phone]

## 2023-07-19 ENCOUNTER — Ambulatory Visit: Payer: Self-pay | Admitting: Internal Medicine

## 2023-08-08 ENCOUNTER — Ambulatory Visit: Admitting: Physician Assistant

## 2023-08-12 ENCOUNTER — Other Ambulatory Visit: Payer: Self-pay | Admitting: Physician Assistant

## 2023-08-23 ENCOUNTER — Ambulatory Visit: Admitting: Physician Assistant

## 2023-08-23 ENCOUNTER — Encounter: Payer: Self-pay | Admitting: Physician Assistant

## 2023-08-23 DIAGNOSIS — T8454XD Infection and inflammatory reaction due to internal left knee prosthesis, subsequent encounter: Secondary | ICD-10-CM | POA: Diagnosis not present

## 2023-08-23 MED ORDER — CELECOXIB 100 MG PO CAPS: 100.0000 mg | ORAL_CAPSULE | Freq: Every day | ORAL | 1 refills | Status: DC

## 2023-08-23 NOTE — Progress Notes (Signed)
 Office Visit Note   Patient: [name]           Date of Birth: Aug 22, 1955           MRN: [id number] Visit Date: 08/23/2023              Requested by: Lonzie Robins, MD [address] [place],  [place] [address] PCP: Avva, [name], MD   Assessment & Plan: Visit Diagnoses:  1. Infection of total left knee replacement, subsequent encounter     Plan: He will make an a follow-up appointment with infectious disease.  Continue his current Bactrim  DS.  Will start him on Celebrex  as he is found this beneficial.  He will take 100 mg once daily.  Encouraged him to continue to work on his glucose levels, wear compression stockings and use compression devices.  Follow-Up Instructions: Return in about 3 months (around 11/23/2023).   Orders:  No orders of the defined types were placed in this encounter.  Meds ordered this encounter  Medications   celecoxib  (CELEBREX ) 100 MG capsule    Sig: Take 1 capsule (100 mg total) by mouth daily.    Dispense:  30 capsule    Refill:  1      Procedures: No procedures performed   Clinical Data: No additional findings.   Subjective: Chief Complaint  Patient presents with   Left Knee - Edema, Pain    HPI Mr. Lynam returns today for follow-up of his left knee and has known left knee joint infection.  He was seen in [place] by Dr. Manley Seeds for second opinion regarding his left knee.  Recommendations were given by Dr. Manley Seeds to continue to work on his diabetes use compression devices daily, continue antibiotic suppression and to follow-up with him in 3 months to see overall how he is doing.  He did discuss potential of moving forward with explantation of his total knee and placement of antibiotic spacer with additional antibiotic treatment prior to potential reimplantation.  Also discussed possible above-knee amputation.  Mr. Magnone presents states that he did not feel that they offered him anything new.  He does not feel that he will be  returning to [facility] to the joint infection center.  States overall today that he is having an overall good day.  He has been using his compression stockings.  He is also been taking his wife's Celebrex  and felt that was beneficial.  Asked about possibly obtaining a prescription for Celebrex .  States last week that his knee pain was 8-9 out of 10 and he is having trouble walking and bending the knee.  He does note that he has had better control of his glucose levels.  He has had no fevers or chills. Review of Systems See HPI  Objective: Vital Signs: There were no vitals taken for this visit.  Physical Exam General: Well-developed well-nourished male no acute distress mood and affect appropriate.  Psych alert and oriented x 3 Ortho Exam Bilateral lower extremities: Full range of motion of the right knee without pain.  Left knee has full extension and flexion to approximately 90 degrees.  Neither knee with any signs of erythema.  Slight warmth to the left knee compared to the right.  Mild edema left lower leg.  Specialty Comments:  No specialty comments available.  Imaging: No results found.   PMFS History: Patient Active Problem List   Diagnosis Date Noted   MRSA infection 05/17/2023   Staphylococcus epidermidis infection 05/17/2023   Chronic infection  of left knee (HCC) 03/24/2023   Status post revision of total replacement of left knee 08/27/2021   Status post revision of total knee replacement, left 08/14/2021   PICC (peripherally inserted central catheter) in place 06/09/2021   Vaccine counseling 05/04/2021   Skin lesion 01/01/2021   Type 2 diabetes mellitus with other specified complication (HCC) 01/01/2021   Nausea and vomiting 09/11/2020   MSSA (methicillin susceptible Staphylococcus aureus) infection 09/03/2020   Effusion, left knee 08/14/2020   Infection of total left knee replacement (HCC) 08/14/2020   Status post total left knee replacement 03/21/2019   S/P carpal  tunnel release right 02/22/19 03/08/2019   Carpal tunnel syndrome of right wrist    HTN (hypertension) 10/27/2018   Prostate cancer (HCC) 05/30/2017   Malignant neoplasm of prostate (HCC) 04/28/2017   Chronic pain of both knees 02/21/2017   Unilateral primary osteoarthritis, left knee 02/21/2017   Unilateral primary osteoarthritis, right knee 02/21/2017   Past Medical History:  Diagnosis Date   Arthritis    Diabetes mellitus without complication (HCC) 2019   Diabetes Type II   GERD (gastroesophageal reflux disease)    diet controlled   HTN (hypertension) 10/27/2018   Hyperlipidemia    Internal hemorrhoids 2014   noted on colonoscopy   MRSA infection 05/17/2023   MSSA (methicillin susceptible Staphylococcus aureus) infection 09/03/2020   Osteoarthritis of both knees    Prostate cancer (HCC)    prostate   Sigmoid diverticulosis 2014   Mild, noted on colonoscopy   Skin lesion 01/01/2021   Staphylococcus epidermidis infection 05/17/2023   Type 2 diabetes mellitus with other specified complication (HCC) 01/01/2021   Vaccine counseling 05/04/2021    Family History  Problem Relation Age of Onset   Cancer Mother        small cell lung    Past Surgical History:  Procedure Laterality Date   CARPAL TUNNEL RELEASE Right 02/22/2019   Procedure: CARPAL TUNNEL RELEASE;  Surgeon: Darrin Emerald, MD;  Location: [facility];  Service: Orthopedics;  Laterality: Right;   COLONOSCOPY N/A 09/13/2012   Procedure: COLONOSCOPY;  Surgeon: Alyce Jubilee, MD;  Location: [facility];  Service: Endoscopy;  Laterality: N/A;  9:30 AM   CYSTOSCOPY WITH URETHRAL DILATATION N/A 06/02/2020   Procedure: CYSTOSCOPY WITH URETHRAL DILATATION;  Surgeon: Florencio Hunting, MD;  Location: [facility];  Service: Urology;  Laterality: N/A;  ONLY NEEDS 30 MIN   EXCISIONAL TOTAL KNEE ARTHROPLASTY WITH ANTIBIOTIC SPACERS Left 05/15/2021   Procedure: LEFT TOTAL KNEE IRRIGATION AND DEBRIDEMENT, EXCISION LEFT TOTAL KNEE  ARTHROPLASTY WITH ANTIBIOTIC SPACERS;  Surgeon: Arnie Lao, MD;  Location: [facility];  Service: Orthopedics;  Laterality: Left;   HERNIA REPAIR     20 years ago   I & D KNEE WITH POLY EXCHANGE Left 08/14/2020   Procedure: IRRIGATION AND DEBRIDEMENT LEFT KNEE with synovectomy WITH POLY EXCHANGE;  Surgeon: Arnie Lao, MD;  Location: [facility];  Service: Orthopedics;  Laterality: Left;   I & D KNEE WITH POLY EXCHANGE Left 03/25/2023   Procedure: INCISION AND DRAINAGE LEFT KNEE, SYNOVECTOMY,  POLY-LINER EXCHANGE;  Surgeon: Arnie Lao, MD;  Location: [facility];  Service: Orthopedics;  Laterality: Left;   KNEE ARTHROSCOPY Left    15 years ago   LYMPHADENECTOMY Bilateral 05/30/2017   Procedure: LYMPHADENECTOMY, PELVIC EXTENDED;  Surgeon: Florencio Hunting, MD;  Location: [facility];  Service: Urology;  Laterality: Bilateral;   ROBOT ASSISTED LAPAROSCOPIC RADICAL PROSTATECTOMY N/A 05/30/2017   Procedure: XI ROBOTIC  ASSISTED LAPAROSCOPIC RADICAL PROSTATECTOMY LEVEL 3;  Surgeon: Florencio Hunting, MD;  Location: [facility];  Service: Urology;  Laterality: N/A;   SHOULDER ARTHROSCOPY Right    15 years ago   TOTAL KNEE ARTHROPLASTY Left 03/21/2019   Procedure: LEFT TOTAL KNEE ARTHROPLASTY;  Surgeon: Arnie Lao, MD;  Location: [facility];  Service: Orthopedics;  Laterality: Left;   TOTAL KNEE REVISION Left 08/14/2021   Procedure: REMOVAL OF ANTIBIOTIC SPACER AND LEFT TOTAL KNEE REVISION ARTHROPLASTY;  Surgeon: Arnie Lao, MD;  Location: [facility];  Service: Orthopedics;  Laterality: Left;   Social History   Occupational History   Occupation: pastor  Tobacco Use   Smoking status: Never   Smokeless tobacco: Never  Vaping Use   Vaping status: Never Used  Substance and Sexual Activity   Alcohol use: No   Drug use: No   Sexual activity: Yes

## 2023-08-30 ENCOUNTER — Ambulatory Visit: Admitting: Internal Medicine

## 2023-09-12 ENCOUNTER — Other Ambulatory Visit: Payer: Self-pay | Admitting: Physician Assistant

## 2023-11-02 ENCOUNTER — Telehealth: Payer: Self-pay | Admitting: Orthopaedic Surgery

## 2023-11-02 ENCOUNTER — Other Ambulatory Visit: Payer: Self-pay | Admitting: Orthopaedic Surgery

## 2023-11-02 ENCOUNTER — Ambulatory Visit: Admitting: Orthopaedic Surgery

## 2023-11-02 ENCOUNTER — Other Ambulatory Visit (INDEPENDENT_AMBULATORY_CARE_PROVIDER_SITE_OTHER): Payer: Self-pay

## 2023-11-02 ENCOUNTER — Encounter: Payer: Self-pay | Admitting: Orthopaedic Surgery

## 2023-11-02 ENCOUNTER — Telehealth: Payer: Self-pay

## 2023-11-02 DIAGNOSIS — T8454XD Infection and inflammatory reaction due to internal left knee prosthesis, subsequent encounter: Secondary | ICD-10-CM | POA: Diagnosis not present

## 2023-11-02 DIAGNOSIS — Z96659 Presence of unspecified artificial knee joint: Secondary | ICD-10-CM

## 2023-11-02 DIAGNOSIS — Z96652 Presence of left artificial knee joint: Secondary | ICD-10-CM

## 2023-11-02 DIAGNOSIS — T84068A Wear of articular bearing surface of other internal prosthetic joint, initial encounter: Secondary | ICD-10-CM

## 2023-11-02 MED ORDER — OXYCODONE HCL 5 MG PO TABS: 5.0000 mg | ORAL_TABLET | Freq: Four times a day (QID) | ORAL | 0 refills | Status: DC | PRN

## 2023-11-02 NOTE — Progress Notes (Signed)
 The patient is well-known to us .  He underwent a left total knee arthroplasty in December 2020 secondary to severe arthritis in the knee.  In May 2022 he underwent an irrigation debridement with a synovectomy and polyliner exchange of that knee secondary infection.  He is someone who at the time and become a poorly controlled diabetic.  He is work on getting his blood glucose under control.  In February 2023 he underwent an excision arthroplasty of his left knee and placement of an antibiotic spacer.  By May 2023 he had removal of antibiotic spacer and revision arthroplasty having cleared the infection it was thought.  However in January of this year we had to take him back to the operating room for incision and drainage of his left knee with synovectomy and polyliner exchange.  We had also eventually sent him to the infectious disease orthopedic specialty clinic in [place] for their opinion.  He does report that his blood glucose does still run high.  He said in [place] they basically told him that they would have to basically remove everything and even consider an above-knee amputation.  Examination of his left knee shows a swollen area medially.  I was able to aspirate fluid from this area to decompress the medial aspect of the knee and this is where we were worried about continued infection.  2 views of the left knee show revision arthroplasty with otherwise no complicating features.  I did counsel him again about his blood glucose and needing to have better control.  He understands that the only way to eradicate infection would be to remove all the components and even consider an above-knee amputation.  He would like us  to at least try to temporize the infection.  He is on chronic antibiotics.    Our plan will be to proceed to surgery this Friday for an open incision and drainage/irrigation debridement of his left knee with placing antibiotic beads within the left knee medial aspect.  He  understands this will only temporize the chronic infection.  It would better to try this so that he can continue to work on blood glucose control for when he does pursue a larger operation for the knee.

## 2023-11-02 NOTE — Telephone Encounter (Signed)
 Patient requests oxycodone  refill [facility]

## 2023-11-02 NOTE — Telephone Encounter (Signed)
 Patient's wife called. Says his leg looks real bad. Thinks it is infected and can not wait until tomorrow. Would like him seen today. Please call with an appointment.

## 2023-11-03 ENCOUNTER — Encounter (HOSPITAL_COMMUNITY)
Admission: RE | Admit: 2023-11-03 | Discharge: 2023-11-03 | Disposition: A | Discharge status: Home / Self Care | Source: Ambulatory Visit | Attending: Orthopaedic Surgery | Admitting: Orthopaedic Surgery

## 2023-11-03 ENCOUNTER — Ambulatory Visit: Admitting: Physician Assistant

## 2023-11-03 ENCOUNTER — Other Ambulatory Visit: Payer: Self-pay

## 2023-11-03 ENCOUNTER — Ambulatory Visit: Admitting: Orthopaedic Surgery

## 2023-11-03 ENCOUNTER — Encounter (HOSPITAL_COMMUNITY): Payer: Self-pay

## 2023-11-03 ENCOUNTER — Other Ambulatory Visit: Payer: Self-pay | Admitting: Physician Assistant

## 2023-11-03 VITALS — Ht 69.0 in | Wt 205.0 lb

## 2023-11-03 DIAGNOSIS — Z01818 Encounter for other preprocedural examination: Secondary | ICD-10-CM

## 2023-11-03 NOTE — Progress Notes (Addendum)
  Date of COVID positive in last 90 days:  No  PCP - Searcy Overcast, MD (note requested) Cardiologist - Emmalene Lawrence, FNP [phone])  Chest x-ray - N/A EKG - 03-18-23 Epic Stress Test - 12-05-18 Epic ECHO - 12-07-18 Epic Cardiac Cath - N/A Pacemaker/ICD device last checked:  N/A Spinal Cord Stimulator:  N/A  Bowel Prep - N/A  Sleep Study - N/A CPAP -   Freestyle Libre R arm Fasting Blood Sugar - 100s  Checks Blood Sugar - multiple times a day  Last dose of GLP1 agonist-  N/A GLP1 instructions:  Do not take after     Last dose of SGLT-2 inhibitors-  N/A SGLT-2 instructions:  Do not take after    Blood Thinner Instructions:  N/A Aspirin  Instructions: N/A Last Dose:  Activity level:  Can go up a flight of stairs and perform activities of daily living without stopping and without symptoms of chest pain or shortness of breath.  Anesthesia review:  Saw cardiology in 2020 for DOE and hypertriglyceridemia.  Patient states that has not had any issues with DOE in years.    Patient denies shortness of breath, fever, cough and chest pain at PAT appointment (completed over the phone)  Patient verbalized understanding of instructions that were given to them at the PAT appointment. Patient was also instructed that they will need to review over the PAT instructions again at home before surgery.

## 2023-11-03 NOTE — Progress Notes (Signed)
 Surgery orders requested via Epic inbox.

## 2023-11-03 NOTE — Patient Instructions (Addendum)
 SURGICAL WAITING ROOM VISITATION Patients having surgery or a procedure may have no more than 2 support people in the waiting area - these visitors may rotate.    Children under the age of 62 must have an adult with them who is not the patient.  If the patient needs to stay at the hospital during part of their recovery, the visitor guidelines for inpatient rooms apply. Pre-op nurse will coordinate an appropriate time for 1 support person to accompany patient in pre-op.  This support person may not rotate.    Please refer to the [facility] website for the visitor guidelines for Inpatients (after your surgery is over and you are in a regular room).       Your procedure is scheduled on: 11-04-23   Report to [facility] Main Entrance    Report to admitting at 11:00  AM   Call this number if you have problems the morning of surgery [phone]   Do not eat food :After Midnight.   After Midnight you may have the following liquids until 10:15 AM DAY OF SURGERY  Water  Non-Citrus Juices (without pulp, NO RED-Apple, White grape, White cranberry) Black Coffee (NO MILK/CREAM OR CREAMERS, sugar ok)  Clear Tea (NO MILK/CREAM OR CREAMERS, sugar ok) regular and decaf                             Plain Jell-O (NO RED)                                           Fruit ices (not with fruit pulp, NO RED)                                     Popsicles (NO RED)                                                               Sports drinks like Gatorade (NO RED)                       If you have questions, please contact your surgeon's office.   FOLLOW  ANY ADDITIONAL PRE OP INSTRUCTIONS YOU RECEIVED FROM YOUR SURGEON'S OFFICE!!!     Oral Hygiene is also important to reduce your risk of infection.                                    Remember - BRUSH YOUR TEETH THE MORNING OF SURGERY WITH YOUR REGULAR TOOTHPASTE   Do NOT smoke after Midnight   Take these medicines the morning of surgery with A  SIP OF WATER :    Escitalopram    Rosuvastatin     Stop all vitamins and herbal supplements 7 days before surgery  How to Manage Your Diabetes Before and After Surgery  Why is it important to control my blood sugar before and after surgery? Improving blood sugar levels before and after surgery helps healing and can limit problems. A way  of improving blood sugar control is eating a healthy diet by:  Eating less sugar and carbohydrates  Increasing activity/exercise  Talking with your doctor about reaching your blood sugar goals High blood sugars (greater than 180 mg/dL) can raise your risk of infections and slow your recovery, so you will need to focus on controlling your diabetes during the weeks before surgery. Make sure that the doctor who takes care of your diabetes knows about your planned surgery including the date and location.  How do I manage my blood sugar before surgery? Check your blood sugar at least 4 times a day, starting 2 days before surgery, to make sure that the level is not too high or low. Check your blood sugar the morning of your surgery when you wake up and every 2 hours until you get to the Short Stay unit. If your blood sugar is less than 70 mg/dL, you will need to treat for low blood sugar: Do not take insulin . Treat a low blood sugar (less than 70 mg/dL) with  cup of clear juice (cranberry or apple), 4 glucose tablets, OR glucose gel. Recheck blood sugar in 15 minutes after treatment (to make sure it is greater than 70 mg/dL). If your blood sugar is not greater than 70 mg/dL on recheck, call [phone] for further instructions. Report your blood sugar to the short stay nurse when you get to Short Stay.  If you are admitted to the hospital after surgery: Your blood sugar will be checked by the staff and you will probably be given insulin  after surgery (instead of oral diabetes medicines) to make sure you have good blood sugar levels. The goal for blood sugar  control after surgery is 80-180 mg/dL.   WHAT DO I DO ABOUT MY DIABETES MEDICATION?  Do not take oral diabetes medicines (pills) the morning of surgery.  THE MORNING OF SURGERY, take 45 units of Tresiba  insulin .   If your CBG is greater than 220 mg/dL, you may take  of your sliding scale  (correction) dose of insulin .  DO NOT TAKE THE FOLLOWING 7 DAYS PRIOR TO SURGERY: Ozempic, Wegovy, Rybelsus (Semaglutide), Byetta (exenatide), Bydureon (exenatide ER), Victoza, Saxenda (liraglutide), or Trulicity (dulaglutide) Mounjaro (Tirzepatide) Adlyxin (Lixisenatide), Polyethylene Glycol Loxenatide.  Reviewed and Endorsed by [facility] Patient Education Committee, August 2015             You may not have any metal on your body including jewelry, and body piercing             Do not wear lotions, powders, cologne, or deodorant              Men may shave face and neck.   Do not bring valuables to the hospital. [facility] IS NOT RESPONSIBLE   FOR VALUABLES.   Contacts, dentures or bridgework may not be worn into surgery.   Bring small overnight bag day of surgery.   DO NOT BRING YOUR HOME MEDICATIONS TO THE HOSPITAL. PHARMACY WILL DISPENSE MEDICATIONS LISTED ON YOUR MEDICATION LIST TO YOU DURING YOUR ADMISSION IN THE HOSPITAL!              Please read over the following fact sheets you were given: IF YOU HAVE QUESTIONS ABOUT YOUR PRE-OP INSTRUCTIONS PLEASE CALL [phone] Gwen  If you received a COVID test during your pre-op visit  it is requested that you wear a mask when out in public, stay away from anyone that may not be feeling well and notify your  surgeon if you develop symptoms. If you test positive for Covid or have been in contact with anyone that has tested positive in the last 10 days please notify you surgeon.  [facility] - Preparing for Surgery Before surgery, you can play an important role.  Because skin is not sterile, your skin needs to be as free of germs as possible.   You can reduce the number of germs on your skin by washing with CHG (chlorahexidine gluconate) soap before surgery.  CHG is an antiseptic cleaner which kills germs and bonds with the skin to continue killing germs even after washing. Please DO NOT use if you have an allergy to CHG or antibacterial soaps.  If your skin becomes reddened/irritated stop using the CHG and inform your nurse when you arrive at Short Stay. Do not shave (including legs and underarms) for at least 48 hours prior to the first CHG shower.  You may shave your face/neck.  Please follow these instructions carefully:  1.  Shower with CHG Soap the night before surgery and the  morning of surgery.  2.  If you choose to wash your hair, wash your hair first as usual with your normal  shampoo.  3.  After you shampoo, rinse your hair and body thoroughly to remove the shampoo.                             4.  Use CHG as you would any other liquid soap.  You can apply chg directly to the skin and wash.  Gently with a scrungie or clean washcloth.  5.  Apply the CHG Soap to your body ONLY FROM THE NECK DOWN.   Do   not use on face/ open                           Wound or open sores. Avoid contact with eyes, ears mouth and   genitals (private parts).                       Wash face,  Genitals (private parts) with your normal soap.             6.  Wash thoroughly, paying special attention to the area where your    surgery  will be performed.  7.  Thoroughly rinse your body with warm water  from the neck down.  8.  DO NOT shower/wash with your normal soap after using and rinsing off the CHG Soap.                9.  Pat yourself dry with a clean towel.            10.  Wear clean pajamas.            11.  Place clean sheets on your bed the night of your first shower and do not  sleep with pets. Day of Surgery : Do not apply any lotions/deodorants the morning of surgery.  Please wear clean clothes to the hospital/surgery center.  FAILURE TO  FOLLOW THESE INSTRUCTIONS MAY RESULT IN THE CANCELLATION OF YOUR SURGERY  PATIENT SIGNATURE_________________________________  NURSE SIGNATURE__________________________________  ________________________________________________________________________

## 2023-11-03 NOTE — Progress Notes (Signed)
 For Anesthesia: PCP - Searcy Overcast, MD  Cardiologist -   Bowel Prep reminder:  Chest x-ray -  EKG -  Stress Test -  ECHO -  Cardiac Cath -  Pacemaker/ICD device last checked: Pacemaker orders received: Device Rep notified:  Spinal Cord Stimulator:  Sleep Study -  CPAP -   Fasting Blood Sugar -  Checks Blood Sugar _____ times a day Date and result of last Hgb A1c-  Last dose of GLP1 agonist-  GLP1 instructions:   Last dose of SGLT-2 inhibitors-  SGLT-2 instructions:   Blood Thinner Instructions: Aspirin  Instructions: Last Dose:  Activity level: Can go up a flight of stairs and activities of daily living without stopping and without chest pain and/or shortness of breath   Able to exercise without chest pain and/or shortness of breath   Unable to go up a flight of stairs without chest pain and/or shortness of breath     Anesthesia review:   Patient denies shortness of breath, fever, cough and chest pain at PAT appointment   Patient verbalized understanding of instructions that were reviewed over the telephone.

## 2023-11-03 NOTE — H&P (Signed)
 [name] is an 68 y.o. male.   Chief Complaint: Left knee swelling and pain HPI: The patient is a 68 year old diabetic with a chronic infection involving his left total knee revision arthroplasty.  In December 2020 he had his left knee replaced secondary to severe arthritis.  In May 2022 he underwent irrigation and debridement with a synovectomy and polyliner exchange of that left knee secondary to infection.  He is somewhat of the time had become a poorly controlled diabetic.  He continues to work on getting his blood glucose under control but it still runs high.  In February 2023 he underwent excision arthroplasty of the left knee and placement of an antibiotic spacer.  By May 2023 had removal of antibiotic spacer and revision arthroplasty having cleared the infection it was thought.  However in January of this year he had to be taken back to the operating room for an incision and drainage as well as synovectomy and polyliner exchange.  He has been on chronic antibiotics since then for suppression.  He was even sent to an infectious disease orthopedic specialty clinic in [place] for their opinion.  He presented to the office yesterday with left knee pain and swelling.  There is obviously still a chronic infection that is going on with his left knee revision arthroplasty.  We have recommended a repeat irrigation debridement.  He understands this will still just temporize the issue and in order to eradicate the infection he would either need removal of all the components with attempt of salvaging the knee versus an eventual fusion or even an above-knee amputation.  Past Medical History:  Diagnosis Date   Arthritis    Diabetes mellitus without complication (HCC) 2019   Diabetes Type II   GERD (gastroesophageal reflux disease)    diet controlled   HTN (hypertension) 10/27/2018   Hyperlipidemia    Internal hemorrhoids 2014   noted on colonoscopy   MRSA infection 05/17/2023   MSSA (methicillin  susceptible Staphylococcus aureus) infection 09/03/2020   Osteoarthritis of both knees    Prostate cancer (HCC)    prostate   Sigmoid diverticulosis 2014   Mild, noted on colonoscopy   Skin lesion 01/01/2021   Staphylococcus epidermidis infection 05/17/2023   Type 2 diabetes mellitus with other specified complication (HCC) 01/01/2021   Vaccine counseling 05/04/2021    Past Surgical History:  Procedure Laterality Date   CARPAL TUNNEL RELEASE Right 02/22/2019   Procedure: CARPAL TUNNEL RELEASE;  Surgeon: Margrette Taft BRAVO, MD;  Location: [facility];  Service: Orthopedics;  Laterality: Right;   COLONOSCOPY N/A 09/13/2012   Procedure: COLONOSCOPY;  Surgeon: Margo LITTIE Haddock, MD;  Location: [facility];  Service: Endoscopy;  Laterality: N/A;  9:30 AM   CYSTOSCOPY WITH URETHRAL DILATATION N/A 06/02/2020   Procedure: CYSTOSCOPY WITH URETHRAL DILATATION;  Surgeon: Renda Glance, MD;  Location: [facility];  Service: Urology;  Laterality: N/A;  ONLY NEEDS 30 MIN   EXCISIONAL TOTAL KNEE ARTHROPLASTY WITH ANTIBIOTIC SPACERS Left 05/15/2021   Procedure: LEFT TOTAL KNEE IRRIGATION AND DEBRIDEMENT, EXCISION LEFT TOTAL KNEE ARTHROPLASTY WITH ANTIBIOTIC SPACERS;  Surgeon: Vernetta Lonni GRADE, MD;  Location: [facility];  Service: Orthopedics;  Laterality: Left;   HERNIA REPAIR     20 years ago   I & D KNEE WITH POLY EXCHANGE Left 08/14/2020   Procedure: IRRIGATION AND DEBRIDEMENT LEFT KNEE with synovectomy WITH POLY EXCHANGE;  Surgeon: Vernetta Lonni GRADE, MD;  Location: [facility];  Service: Orthopedics;  Laterality: Left;   I &  D KNEE WITH POLY EXCHANGE Left 03/25/2023   Procedure: INCISION AND DRAINAGE LEFT KNEE, SYNOVECTOMY,  POLY-LINER EXCHANGE;  Surgeon: Vernetta Lonni GRADE, MD;  Location: [facility];  Service: Orthopedics;  Laterality: Left;   KNEE ARTHROSCOPY Left    15 years ago   LYMPHADENECTOMY Bilateral 05/30/2017   Procedure: LYMPHADENECTOMY, PELVIC EXTENDED;  Surgeon: Renda Glance, MD;  Location: [facility];  Service: Urology;  Laterality: Bilateral;   ROBOT ASSISTED LAPAROSCOPIC RADICAL PROSTATECTOMY N/A 05/30/2017   Procedure: XI ROBOTIC ASSISTED LAPAROSCOPIC RADICAL PROSTATECTOMY LEVEL 3;  Surgeon: Renda Glance, MD;  Location: [facility];  Service: Urology;  Laterality: N/A;   SHOULDER ARTHROSCOPY Right    15 years ago   TOTAL KNEE ARTHROPLASTY Left 03/21/2019   Procedure: LEFT TOTAL KNEE ARTHROPLASTY;  Surgeon: Vernetta Lonni GRADE, MD;  Location: [facility];  Service: Orthopedics;  Laterality: Left;   TOTAL KNEE REVISION Left 08/14/2021   Procedure: REMOVAL OF ANTIBIOTIC SPACER AND LEFT TOTAL KNEE REVISION ARTHROPLASTY;  Surgeon: Vernetta Lonni GRADE, MD;  Location: [facility];  Service: Orthopedics;  Laterality: Left;    Family History  Problem Relation Age of Onset   Cancer Mother        small cell lung   Social History:  reports that he has never smoked. He has never used smokeless tobacco. He reports that he does not drink alcohol and does not use drugs.  Allergies:  Allergies  Allergen Reactions   Rifampin  Nausea Only and Other (See Comments)    Skin crawling- bodyache    No medications prior to admission.    No results found for this or any previous visit (from the past 48 hours). XR Knee 1-2 Views Left Result Date: 11/02/2023 2 views of the left knee show a revision arthroplasty with no complicating features.   Review of Systems  There were no vitals taken for this visit. Physical Exam Vitals reviewed.  Constitutional:      Appearance: Normal appearance.  HENT:     Head: Normocephalic and atraumatic.  Eyes:     Extraocular Movements: Extraocular movements intact.     Pupils: Pupils are equal, round, and reactive to light.  Cardiovascular:     Rate and Rhythm: Normal rate and regular rhythm.  Pulmonary:     Effort: Pulmonary effort is normal.  Abdominal:     Palpations: Abdomen is soft.  Musculoskeletal:     Cervical back: Normal range of motion and neck  supple.     Left knee: Effusion and erythema present.  Neurological:     Mental Status: He is alert and oriented to person, place, and time.  Psychiatric:        Behavior: Behavior normal.      Assessment/Plan Chronic infection left knee  The plan is to take the patient to the operating room for an open synovectomy with an incision and drainage and placement of antibiotic powder into the knee knee joint.  This is again an effort to only suppress the infection.  Lonni GRADE Vernetta, MD 11/03/2023, 10:25 AM

## 2023-11-04 ENCOUNTER — Other Ambulatory Visit: Payer: Self-pay

## 2023-11-04 ENCOUNTER — Encounter (HOSPITAL_COMMUNITY)
Admission: RE | Disposition: A | Discharge status: Home / Self Care | Payer: Self-pay | Source: Home / Self Care | Attending: Orthopaedic Surgery

## 2023-11-04 ENCOUNTER — Encounter (HOSPITAL_COMMUNITY): Payer: Self-pay | Admitting: Orthopaedic Surgery

## 2023-11-04 ENCOUNTER — Ambulatory Visit (HOSPITAL_COMMUNITY): Admitting: Certified Registered"

## 2023-11-04 ENCOUNTER — Ambulatory Visit (HOSPITAL_BASED_OUTPATIENT_CLINIC_OR_DEPARTMENT_OTHER): Admitting: Certified Registered"

## 2023-11-04 ENCOUNTER — Observation Stay (HOSPITAL_COMMUNITY)
Admission: RE | Admit: 2023-11-04 | Discharge: 2023-11-05 | Disposition: A | Discharge status: Home / Self Care | Attending: Orthopaedic Surgery | Admitting: Orthopaedic Surgery

## 2023-11-04 DIAGNOSIS — Z794 Long term (current) use of insulin: Secondary | ICD-10-CM | POA: Diagnosis not present

## 2023-11-04 DIAGNOSIS — Z96652 Presence of left artificial knee joint: Secondary | ICD-10-CM | POA: Insufficient documentation

## 2023-11-04 DIAGNOSIS — Z79899 Other long term (current) drug therapy: Secondary | ICD-10-CM | POA: Insufficient documentation

## 2023-11-04 DIAGNOSIS — T8454XA Infection and inflammatory reaction due to internal left knee prosthesis, initial encounter: Principal | ICD-10-CM | POA: Insufficient documentation

## 2023-11-04 DIAGNOSIS — Z01818 Encounter for other preprocedural examination: Secondary | ICD-10-CM

## 2023-11-04 DIAGNOSIS — E119 Type 2 diabetes mellitus without complications: Secondary | ICD-10-CM | POA: Insufficient documentation

## 2023-11-04 DIAGNOSIS — Z8546 Personal history of malignant neoplasm of prostate: Secondary | ICD-10-CM | POA: Insufficient documentation

## 2023-11-04 DIAGNOSIS — M009 Pyogenic arthritis, unspecified: Secondary | ICD-10-CM | POA: Diagnosis present

## 2023-11-04 DIAGNOSIS — E1169 Type 2 diabetes mellitus with other specified complication: Principal | ICD-10-CM

## 2023-11-04 DIAGNOSIS — M86662 Other chronic osteomyelitis, left tibia and fibula: Secondary | ICD-10-CM | POA: Insufficient documentation

## 2023-11-04 DIAGNOSIS — T8454XD Infection and inflammatory reaction due to internal left knee prosthesis, subsequent encounter: Secondary | ICD-10-CM

## 2023-11-04 DIAGNOSIS — I10 Essential (primary) hypertension: Secondary | ICD-10-CM | POA: Insufficient documentation

## 2023-11-04 DIAGNOSIS — M25562 Pain in left knee: Secondary | ICD-10-CM | POA: Diagnosis present

## 2023-11-04 HISTORY — PX: IRRIGATION AND DEBRIDEMENT KNEE: SHX5185

## 2023-11-04 LAB — CBC
HCT: 34 % — ABNORMAL LOW (ref 39.0–52.0)
Hemoglobin: 11.3 g/dL — ABNORMAL LOW (ref 13.0–17.0)
MCH: 28.1 pg (ref 26.0–34.0)
MCHC: 33.2 g/dL (ref 30.0–36.0)
MCV: 84.6 fL (ref 80.0–100.0)
Platelets: 160 K/uL (ref 150–400)
RBC: 4.02 MIL/uL — ABNORMAL LOW (ref 4.22–5.81)
RDW: 14.6 % (ref 11.5–15.5)
WBC: 5.1 K/uL (ref 4.0–10.5)
nRBC: 0 % (ref 0.0–0.2)

## 2023-11-04 LAB — GLUCOSE, CAPILLARY
Glucose-Capillary: 173 mg/dL — ABNORMAL HIGH (ref 70–99)
Glucose-Capillary: 164 mg/dL — ABNORMAL HIGH (ref 70–99)
Glucose-Capillary: 244 mg/dL — ABNORMAL HIGH (ref 70–99)
Glucose-Capillary: 328 mg/dL — ABNORMAL HIGH (ref 70–99)

## 2023-11-04 LAB — BASIC METABOLIC PANEL WITH GFR
Anion gap: 10 (ref 5–15)
BUN: 21 mg/dL (ref 8–23)
CO2: 26 mmol/L (ref 22–32)
Calcium: 9.3 mg/dL (ref 8.9–10.3)
Chloride: 101 mmol/L (ref 98–111)
Creatinine, Ser: 1.11 mg/dL (ref 0.61–1.24)
GFR, Estimated: >60 mL/min (ref >60)
Glucose, Bld: 167 mg/dL — ABNORMAL HIGH (ref 70–99)
Potassium: 4.1 mmol/L (ref 3.5–5.1)
Sodium: 137 mmol/L (ref 135–145)

## 2023-11-04 SURGERY — IRRIGATION AND DEBRIDEMENT KNEE
Anesthesia: General | Site: Knee | Laterality: Left

## 2023-11-04 MED ORDER — OXYCODONE HCL 5 MG PO TABS
ORAL_TABLET | ORAL | Status: AC
  Filled 2023-11-04: qty 1

## 2023-11-04 MED ORDER — DEXAMETHASONE SODIUM PHOSPHATE 10 MG/ML IJ SOLN
INTRAMUSCULAR | Status: AC
  Filled 2023-11-04: qty 1

## 2023-11-04 MED ORDER — PROPOFOL 10 MG/ML IV BOLUS
INTRAVENOUS | Status: DC | PRN
  Administered 2023-11-04: 120 mg via INTRAVENOUS

## 2023-11-04 MED ORDER — SODIUM CHLORIDE 0.9 % IR SOLN
Status: DC | PRN
  Administered 2023-11-04: 3000 mL

## 2023-11-04 MED ORDER — CEFAZOLIN SODIUM-DEXTROSE 2-4 GM/100ML-% IV SOLN
2.0000 g | INTRAVENOUS | Status: AC
  Administered 2023-11-04: 2 g via INTRAVENOUS
  Filled 2023-11-04: qty 100

## 2023-11-04 MED ORDER — POVIDONE-IODINE 10 % EX SWAB
2.0000 | Freq: Once | CUTANEOUS | Status: AC
  Administered 2023-11-04: 2 via TOPICAL

## 2023-11-04 MED ORDER — FENTANYL CITRATE (PF) 100 MCG/2ML IJ SOLN
INTRAMUSCULAR | Status: AC
Start: 2023-11-04 — End: 2023-11-04
  Filled 2023-11-04: qty 2

## 2023-11-04 MED ORDER — PROPOFOL 10 MG/ML IV BOLUS
INTRAVENOUS | Status: AC
  Filled 2023-11-04: qty 20

## 2023-11-04 MED ORDER — HYDROMORPHONE HCL 1 MG/ML IJ SOLN
0.5000 mg | INTRAMUSCULAR | Status: AC | PRN
  Administered 2023-11-04 (×4): 0.5 mg via INTRAVENOUS

## 2023-11-04 MED ORDER — ACETAMINOPHEN 10 MG/ML IV SOLN
1000.0000 mg | Freq: Once | INTRAVENOUS | Status: DC | PRN
  Administered 2023-11-04: 1000 mg via INTRAVENOUS

## 2023-11-04 MED ORDER — ONDANSETRON HCL 4 MG PO TABS: 4.0000 mg | ORAL_TABLET | Freq: Four times a day (QID) | ORAL | Status: DC | PRN

## 2023-11-04 MED ORDER — ONDANSETRON HCL 4 MG/2ML IJ SOLN: 4.0000 mg | Freq: Four times a day (QID) | INTRAMUSCULAR | Status: DC | PRN

## 2023-11-04 MED ORDER — HYDROMORPHONE HCL 1 MG/ML IJ SOLN
INTRAMUSCULAR | Status: AC
  Filled 2023-11-04: qty 1

## 2023-11-04 MED ORDER — METOCLOPRAMIDE HCL 5 MG PO TABS: 5.0000 mg | ORAL_TABLET | Freq: Three times a day (TID) | ORAL | Status: DC | PRN

## 2023-11-04 MED ORDER — MUPIROCIN 2 % EX OINT: 1.0000 | TOPICAL_OINTMENT | Freq: Two times a day (BID) | CUTANEOUS | 0 refills | Status: AC

## 2023-11-04 MED ORDER — ACETAMINOPHEN 325 MG PO TABS: 325.0000 mg | ORAL_TABLET | Freq: Four times a day (QID) | ORAL | Status: DC | PRN

## 2023-11-04 MED ORDER — OXYCODONE HCL 5 MG PO TABS
5.0000 mg | ORAL_TABLET | Freq: Once | ORAL | Status: AC | PRN
  Administered 2023-11-04: 5 mg via ORAL

## 2023-11-04 MED ORDER — VANCOMYCIN HCL IN DEXTROSE 1-5 GM/200ML-% IV SOLN
1000.0000 mg | Freq: Once | INTRAVENOUS | Status: AC
  Administered 2023-11-04: 1000 mg via INTRAVENOUS
  Filled 2023-11-04: qty 200

## 2023-11-04 MED ORDER — VANCOMYCIN HCL 1000 MG IV SOLR
INTRAVENOUS | Status: AC
  Filled 2023-11-04: qty 20

## 2023-11-04 MED ORDER — LIDOCAINE HCL (PF) 2 % IJ SOLN
INTRAMUSCULAR | Status: AC
  Filled 2023-11-04: qty 5

## 2023-11-04 MED ORDER — FENTANYL CITRATE PF 50 MCG/ML IJ SOSY
PREFILLED_SYRINGE | INTRAMUSCULAR | Status: AC
  Filled 2023-11-04: qty 1

## 2023-11-04 MED ORDER — VANCOMYCIN HCL IN DEXTROSE 1-5 GM/200ML-% IV SOLN: 1000.0000 mg | INTRAVENOUS | Status: DC

## 2023-11-04 MED ORDER — CHLORHEXIDINE GLUCONATE 0.12 % MT SOLN
15.0000 mL | Freq: Once | OROMUCOSAL | Status: AC
  Administered 2023-11-04: 15 mL via OROMUCOSAL

## 2023-11-04 MED ORDER — ONDANSETRON HCL 4 MG/2ML IJ SOLN: 4.0000 mg | Freq: Once | INTRAMUSCULAR | Status: DC | PRN

## 2023-11-04 MED ORDER — EPHEDRINE SULFATE-NACL 50-0.9 MG/10ML-% IV SOSY
PREFILLED_SYRINGE | INTRAVENOUS | Status: DC | PRN
  Administered 2023-11-04: 5 mg via INTRAVENOUS

## 2023-11-04 MED ORDER — ALTEPLASE 100 MG IV SOLR
2.0000 mg | Freq: Once | INTRAVENOUS | Status: DC
  Filled 2023-11-04: qty 100

## 2023-11-04 MED ORDER — KETOROLAC TROMETHAMINE 15 MG/ML IJ SOLN
15.0000 mg | Freq: Once | INTRAMUSCULAR | Status: AC
  Administered 2023-11-04: 15 mg via INTRAVENOUS

## 2023-11-04 MED ORDER — VANCOMYCIN HCL 1000 MG IV SOLR
INTRAVENOUS | Status: DC | PRN
Start: 2023-11-04 — End: 2023-11-04
  Administered 2023-11-04: 1000 mg

## 2023-11-04 MED ORDER — INSULIN ASPART 100 UNIT/ML IJ SOLN
0.0000 [IU] | Freq: Three times a day (TID) | INTRAMUSCULAR | Status: DC
  Administered 2023-11-04: 5 [IU] via SUBCUTANEOUS
  Administered 2023-11-05: 8 [IU] via SUBCUTANEOUS

## 2023-11-04 MED ORDER — INSULIN ASPART 100 UNIT/ML IJ SOLN
0.0000 [IU] | INTRAMUSCULAR | Status: DC | PRN
  Administered 2023-11-04: 2 [IU] via SUBCUTANEOUS
  Filled 2023-11-04: qty 1

## 2023-11-04 MED ORDER — MIDAZOLAM HCL 2 MG/2ML IJ SOLN: 1.0000 mg | Freq: Once | INTRAMUSCULAR | Status: DC

## 2023-11-04 MED ORDER — PANTOPRAZOLE SODIUM 40 MG PO TBEC
40.0000 mg | DELAYED_RELEASE_TABLET | Freq: Every day | ORAL | Status: DC
  Administered 2023-11-04 – 2023-11-05 (×2): 40 mg via ORAL
  Filled 2023-11-04 (×2): qty 1

## 2023-11-04 MED ORDER — ESCITALOPRAM OXALATE 10 MG PO TABS
10.0000 mg | ORAL_TABLET | Freq: Every day | ORAL | Status: DC
  Administered 2023-11-04 – 2023-11-05 (×2): 10 mg via ORAL
  Filled 2023-11-04 (×2): qty 1

## 2023-11-04 MED ORDER — FENTANYL CITRATE PF 50 MCG/ML IJ SOSY
PREFILLED_SYRINGE | INTRAMUSCULAR | Status: AC
  Filled 2023-11-04: qty 2

## 2023-11-04 MED ORDER — HYDROMORPHONE HCL 1 MG/ML IJ SOLN: 0.5000 mg | INTRAMUSCULAR | Status: DC | PRN

## 2023-11-04 MED ORDER — LOSARTAN POTASSIUM-HCTZ 50-12.5 MG PO TABS: 1.0000 | ORAL_TABLET | Freq: Every day | ORAL | Status: DC

## 2023-11-04 MED ORDER — METHOCARBAMOL 1000 MG/10ML IJ SOLN
500.0000 mg | Freq: Four times a day (QID) | INTRAMUSCULAR | Status: DC | PRN
Start: 2023-11-04 — End: 2023-11-05

## 2023-11-04 MED ORDER — DOCUSATE SODIUM 100 MG PO CAPS
100.0000 mg | ORAL_CAPSULE | Freq: Two times a day (BID) | ORAL | Status: DC
  Administered 2023-11-04 – 2023-11-05 (×2): 100 mg via ORAL
  Filled 2023-11-04 (×2): qty 1

## 2023-11-04 MED ORDER — INSULIN DEGLUDEC 100 UNIT/ML ~~LOC~~ SOPN: 90.0000 [IU] | PEN_INJECTOR | Freq: Every day | SUBCUTANEOUS | Status: DC

## 2023-11-04 MED ORDER — OXYCODONE HCL 5 MG PO TABS
10.0000 mg | ORAL_TABLET | ORAL | Status: DC | PRN
  Filled 2023-11-04: qty 2

## 2023-11-04 MED ORDER — ACETAMINOPHEN 10 MG/ML IV SOLN
INTRAVENOUS | Status: AC
  Filled 2023-11-04: qty 100

## 2023-11-04 MED ORDER — FENTANYL CITRATE PF 50 MCG/ML IJ SOSY: 50.0000 ug | PREFILLED_SYRINGE | Freq: Once | INTRAMUSCULAR | Status: DC

## 2023-11-04 MED ORDER — ORAL CARE MOUTH RINSE: 15.0000 mL | Freq: Once | OROMUCOSAL | Status: AC

## 2023-11-04 MED ORDER — CHLORHEXIDINE GLUCONATE 4 % EX SOLN: 1.0000 | CUTANEOUS | 1 refills | Status: DC

## 2023-11-04 MED ORDER — MIDAZOLAM HCL 2 MG/2ML IJ SOLN
INTRAMUSCULAR | Status: AC
  Filled 2023-11-04: qty 2

## 2023-11-04 MED ORDER — INSULIN GLARGINE-YFGN 100 UNIT/ML ~~LOC~~ SOLN
90.0000 [IU] | Freq: Every day | SUBCUTANEOUS | Status: DC
  Administered 2023-11-05: 90 [IU] via SUBCUTANEOUS
  Filled 2023-11-04: qty 0.9

## 2023-11-04 MED ORDER — OXYCODONE HCL 5 MG PO TABS
5.0000 mg | ORAL_TABLET | ORAL | Status: DC | PRN
  Administered 2023-11-04 – 2023-11-05 (×2): 10 mg via ORAL
  Filled 2023-11-04: qty 2

## 2023-11-04 MED ORDER — KETOROLAC TROMETHAMINE 15 MG/ML IJ SOLN
INTRAMUSCULAR | Status: AC
  Filled 2023-11-04: qty 1

## 2023-11-04 MED ORDER — DEXAMETHASONE SODIUM PHOSPHATE 10 MG/ML IJ SOLN
INTRAMUSCULAR | Status: DC | PRN
  Administered 2023-11-04: 10 mg via INTRAVENOUS

## 2023-11-04 MED ORDER — LOSARTAN POTASSIUM 50 MG PO TABS
50.0000 mg | ORAL_TABLET | Freq: Every day | ORAL | Status: DC
  Administered 2023-11-04 – 2023-11-05 (×2): 50 mg via ORAL
  Filled 2023-11-04 (×2): qty 1

## 2023-11-04 MED ORDER — FENTANYL CITRATE (PF) 100 MCG/2ML IJ SOLN
INTRAMUSCULAR | Status: DC | PRN
  Administered 2023-11-04 (×2): 50 ug via INTRAVENOUS

## 2023-11-04 MED ORDER — SULFAMETHOXAZOLE-TRIMETHOPRIM 800-160 MG PO TABS
1.0000 | ORAL_TABLET | Freq: Two times a day (BID) | ORAL | Status: DC
  Administered 2023-11-04 – 2023-11-05 (×2): 1 via ORAL
  Filled 2023-11-04 (×2): qty 1

## 2023-11-04 MED ORDER — FENTANYL CITRATE PF 50 MCG/ML IJ SOSY
25.0000 ug | PREFILLED_SYRINGE | INTRAMUSCULAR | Status: DC | PRN
  Administered 2023-11-04 (×3): 50 ug via INTRAVENOUS

## 2023-11-04 MED ORDER — ROSUVASTATIN CALCIUM 20 MG PO TABS
20.0000 mg | ORAL_TABLET | Freq: Every day | ORAL | Status: DC
  Administered 2023-11-04 – 2023-11-05 (×2): 20 mg via ORAL
  Filled 2023-11-04 (×2): qty 1

## 2023-11-04 MED ORDER — LACTATED RINGERS IV SOLN: INTRAVENOUS | Status: DC

## 2023-11-04 MED ORDER — OXYCODONE HCL 5 MG/5ML PO SOLN: 5.0000 mg | Freq: Once | ORAL | Status: AC | PRN

## 2023-11-04 MED ORDER — VANCOMYCIN HCL IN DEXTROSE 1-5 GM/200ML-% IV SOLN
1000.0000 mg | Freq: Two times a day (BID) | INTRAVENOUS | Status: DC
  Administered 2023-11-04: 1000 mg via INTRAVENOUS
  Filled 2023-11-04 (×2): qty 200

## 2023-11-04 MED ORDER — METHOCARBAMOL 500 MG PO TABS
500.0000 mg | ORAL_TABLET | Freq: Four times a day (QID) | ORAL | Status: DC | PRN
  Administered 2023-11-04 – 2023-11-05 (×2): 500 mg via ORAL
  Filled 2023-11-04 (×2): qty 1

## 2023-11-04 MED ORDER — EPHEDRINE 5 MG/ML INJ
INTRAVENOUS | Status: AC
  Filled 2023-11-04: qty 5

## 2023-11-04 MED ORDER — ONDANSETRON HCL 4 MG/2ML IJ SOLN
INTRAMUSCULAR | Status: DC | PRN
  Administered 2023-11-04: 4 mg via INTRAVENOUS

## 2023-11-04 MED ORDER — ONDANSETRON HCL 4 MG/2ML IJ SOLN
INTRAMUSCULAR | Status: AC
Start: 2023-11-04 — End: 2023-11-04
  Filled 2023-11-04: qty 2

## 2023-11-04 MED ORDER — METOCLOPRAMIDE HCL 5 MG/ML IJ SOLN: 5.0000 mg | Freq: Three times a day (TID) | INTRAMUSCULAR | Status: DC | PRN

## 2023-11-04 MED ORDER — HYDROCHLOROTHIAZIDE 12.5 MG PO TABS
12.5000 mg | ORAL_TABLET | Freq: Every day | ORAL | Status: DC
  Administered 2023-11-04 – 2023-11-05 (×2): 12.5 mg via ORAL
  Filled 2023-11-04 (×2): qty 1

## 2023-11-04 MED ORDER — DIPHENHYDRAMINE HCL 12.5 MG/5ML PO ELIX: 12.5000 mg | ORAL_SOLUTION | ORAL | Status: DC | PRN

## 2023-11-04 MED ORDER — LIDOCAINE 2% (20 MG/ML) 5 ML SYRINGE
INTRAMUSCULAR | Status: DC | PRN
  Administered 2023-11-04: 80 mg via INTRAVENOUS

## 2023-11-04 MED ORDER — INSULIN ASPART 100 UNIT/ML ~~LOC~~ SOLN: 10.0000 [IU] | Freq: Three times a day (TID) | SUBCUTANEOUS | Status: DC

## 2023-11-04 MED ORDER — MIDAZOLAM HCL 5 MG/5ML IJ SOLN
INTRAMUSCULAR | Status: DC | PRN
  Administered 2023-11-04: 2 mg via INTRAVENOUS

## 2023-11-04 MED ORDER — SODIUM CHLORIDE 0.9 % IV SOLN: INTRAVENOUS | Status: DC

## 2023-11-04 MED ORDER — 0.9 % SODIUM CHLORIDE (POUR BTL) OPTIME
TOPICAL | Status: DC | PRN
  Administered 2023-11-04: 1000 mL

## 2023-11-04 SURGICAL SUPPLY — 41 items
BAG COUNTER SPONGE SURGICOUNT (BAG) IMPLANT
BAG ZIPLOCK 12X15 (MISCELLANEOUS) ×1 IMPLANT
BANDAGE ESMARK 6X9 LF (GAUZE/BANDAGES/DRESSINGS) ×1 IMPLANT
BNDG ELASTIC 6INX 5YD STR LF (GAUZE/BANDAGES/DRESSINGS) ×1 IMPLANT
BNDG ELASTIC 6X10 VLCR STRL LF (GAUZE/BANDAGES/DRESSINGS) IMPLANT
CNTNR URN SCR LID CUP LEK RST (MISCELLANEOUS) IMPLANT
COVER SURGICAL LIGHT HANDLE (MISCELLANEOUS) ×1 IMPLANT
CUFF TRNQT CYL 34X4.125X (TOURNIQUET CUFF) ×1 IMPLANT
DERMABOND ADVANCED .7 DNX12 (GAUZE/BANDAGES/DRESSINGS) ×1 IMPLANT
DRAPE SHEET LG 3/4 BI-LAMINATE (DRAPES) ×1 IMPLANT
DRSG ADAPTIC 3X8 NADH LF (GAUZE/BANDAGES/DRESSINGS) ×1 IMPLANT
DRSG AQUACEL AG ADV 3.5X10 (GAUZE/BANDAGES/DRESSINGS) ×1 IMPLANT
DRSG TEGADERM 4X4.75 (GAUZE/BANDAGES/DRESSINGS) ×1 IMPLANT
DURAPREP 26ML APPLICATOR (WOUND CARE) ×1 IMPLANT
ELECT REM PT RETURN 15FT ADLT (MISCELLANEOUS) ×1 IMPLANT
EVACUATOR 1/8 PVC DRAIN (DRAIN) ×1 IMPLANT
GAUZE PAD ABD 8X10 STRL (GAUZE/BANDAGES/DRESSINGS) ×1 IMPLANT
GAUZE SPONGE 2X2 8PLY STRL LF (GAUZE/BANDAGES/DRESSINGS) ×1 IMPLANT
GAUZE SPONGE 4X4 12PLY STRL (GAUZE/BANDAGES/DRESSINGS) ×1 IMPLANT
GAUZE XEROFORM 1X8 LF (GAUZE/BANDAGES/DRESSINGS) IMPLANT
GLOVE BIO SURGEON STRL SZ7.5 (GLOVE) ×1 IMPLANT
GLOVE BIOGEL PI IND STRL 8 (GLOVE) ×2 IMPLANT
GLOVE ECLIPSE 8.0 STRL XLNG CF (GLOVE) IMPLANT
GOWN SPEC L3 XXLG W/TWL (GOWN DISPOSABLE) ×2 IMPLANT
KIT BASIN OR (CUSTOM PROCEDURE TRAY) ×1 IMPLANT
KIT TURNOVER KIT A (KITS) ×1 IMPLANT
MANIFOLD NEPTUNE II (INSTRUMENTS) ×1 IMPLANT
PACK TOTAL JOINT (CUSTOM PROCEDURE TRAY) ×1 IMPLANT
PADDING CAST COTTON 6X4 STRL (CAST SUPPLIES) ×1 IMPLANT
PROTECTOR NERVE ULNAR (MISCELLANEOUS) ×1 IMPLANT
SET HNDPC FAN SPRY TIP SCT (DISPOSABLE) ×1 IMPLANT
STAPLER SKIN PROX 35W (STAPLE) ×1 IMPLANT
SUT ETHIBOND NAB CT1 #1 30IN (SUTURE) IMPLANT
SUT ETHILON 2 0 PS N (SUTURE) IMPLANT
SUT MNCRL AB 4-0 PS2 18 (SUTURE) ×1 IMPLANT
SUT VIC AB 0 CT1 36 (SUTURE) ×1 IMPLANT
SUT VIC AB 1 CT1 36 (SUTURE) ×2 IMPLANT
SUT VIC AB 2-0 CT1 TAPERPNT 27 (SUTURE) ×3 IMPLANT
SWAB COLLECTION DEVICE MRSA (MISCELLANEOUS) ×1 IMPLANT
SWAB CULTURE ESWAB REG 1ML (MISCELLANEOUS) ×1 IMPLANT
TOWEL OR 17X26 10 PK STRL BLUE (TOWEL DISPOSABLE) ×2 IMPLANT

## 2023-11-04 NOTE — Plan of Care (Signed)

## 2023-11-04 NOTE — Anesthesia Procedure Notes (Signed)
 Procedure Name: LMA Insertion Date/Time: 11/04/2023 12:35 PM  Performed by: Marri Mcneff D, CRNAPre-anesthesia Checklist: Patient identified, Emergency Drugs available, Suction available and Patient being monitored Patient Re-evaluated:Patient Re-evaluated prior to induction Oxygen Delivery Method: Circle system utilized Preoxygenation: Pre-oxygenation with 100% oxygen Induction Type: IV induction Ventilation: Mask ventilation without difficulty LMA: LMA inserted LMA Size: 4.0 Tube type: Oral Number of attempts: 1 Placement Confirmation: positive ETCO2 and breath sounds checked- equal and bilateral Tube secured with: Tape Dental Injury: Teeth and Oropharynx as per pre-operative assessment

## 2023-11-04 NOTE — Interval H&P Note (Signed)
 History and Physical Interval Note: The patient understands that he is here today for an incision and drainage/irrigation and debridement of a chronically infected left knee revision arthroplasty.  He understands this is in hopes of still suppressing his infection.  The only other options are taking everything out and fusing the knee if there is bone quality left with the hopes of potentially replacing again versus an above-knee amputation.  There is been no acute interval changes to medical status.  The risks and benefits of surgery been discussed in detail and informed consent is been obtained.  The left operative knee has been marked.  11/04/2023 11:12 AM  [name]  has presented today for surgery, with the diagnosis of chronic infection left knee.  The various methods of treatment have been discussed with the patient and family. After consideration of risks, benefits and other options for treatment, the patient has consented to  Procedure(s) with comments: IRRIGATION AND DEBRIDEMENT KNEE (Left) - INCISION AND DRAINAGE LEFT KNEE as a surgical intervention.  The patient's history has been reviewed, patient examined, no change in status, stable for surgery.  I have reviewed the patient's chart and labs.  Questions were answered to the patient's satisfaction.     [name]

## 2023-11-04 NOTE — Anesthesia Preprocedure Evaluation (Signed)
 Anesthesia Evaluation  Patient identified by MRN, date of birth, ID band Patient awake    Reviewed: Allergy & Precautions, NPO status , Patient's Chart, lab work & pertinent test results, reviewed documented beta blocker date and time   History of Anesthesia Complications Negative for: history of anesthetic complications  Airway Mallampati: II  TM Distance: >3 FB     Dental no notable dental hx.    Pulmonary neg COPD   breath sounds clear to auscultation       Cardiovascular hypertension, (-) CAD, (-) Past MI, (-) Cardiac Stents, (-) CABG and (-) Peripheral Vascular Disease  Rhythm:Regular Rate:Normal     Neuro/Psych neg Seizures  Neuromuscular disease    GI/Hepatic ,GERD  ,,(+) neg Cirrhosis        Endo/Other  diabetes, Type 2    Renal/GU Renal disease     Musculoskeletal  (+) Arthritis , Osteoarthritis,    Abdominal   Peds  Hematology   Anesthesia Other Findings   Reproductive/Obstetrics                              Anesthesia Physical Anesthesia Plan  ASA: 2  Anesthesia Plan: General   Post-op Pain Management:    Induction: Intravenous  PONV Risk Score and Plan: 2 and Ondansetron  and Dexamethasone   Airway Management Planned: LMA  Additional Equipment:   Intra-op Plan:   Post-operative Plan:   Informed Consent: I have reviewed the patients History and Physical, chart, labs and discussed the procedure including the risks, benefits and alternatives for the proposed anesthesia with the patient or authorized representative who has indicated his/her understanding and acceptance.     Dental advisory given  Plan Discussed with: CRNA  Anesthesia Plan Comments:          Anesthesia Quick Evaluation

## 2023-11-04 NOTE — Transfer of Care (Signed)
 Immediate Anesthesia Transfer of Care Note  Patient: [name]  Procedure(s) Performed: IRRIGATION AND DEBRIDEMENT KNEE (Left: Knee)  Patient Location: PACU  Anesthesia Type:General  Level of Consciousness: awake, alert , and oriented  Airway & Oxygen Therapy: Patient Spontanous Breathing and Patient connected to face mask oxygen  Post-op Assessment: Report given to RN and Post -op Vital signs reviewed and stable  Post vital signs: Reviewed and stable  Last Vitals:  Vitals Value Taken Time  BP 134/72 11/04/23 13:36  Temp    Pulse 73 11/04/23 13:38  Resp 13 11/04/23 13:38  SpO2 100 % 11/04/23 13:38  Vitals shown include unfiled device data.  Last Pain:  Vitals:   11/04/23 1148  TempSrc:   PainSc: 0-No pain         Complications: No notable events documented.

## 2023-11-04 NOTE — Op Note (Signed)
 Operative Note  Date of operation: 11/04/2023 Preoperative diagnosis: Left knee chronically infected prosthesis Postoperative diagnosis: Same  Procedure: Incision and drainage/irrigation debridement with arthrotomy left knee  Surgeon: Lonni GRADE. Vernetta, MD Assistant: Tory Gaskins, PA-C  Anesthesia: General Antibiotics: IV vancomycin  and IV Ancef  Blood loss: Under 100 cc Complications: None  Indications: The patient is a 68 year old gentleman well-known to us .  He has a history of a left total knee arthroplasty done several years ago.  Over a year later he did develop an infection with that left knee.  After an attempted salvage of the knee and excision arthroplasty was performed leaving all components out and antibiotic spacer in place.  Once that infection had cleared and the knee had become sterile, a revision arthroplasty was performed.  Fortunately he has developed another infection within his left knee.  He does not have the best control of his blood glucose as well.  In January of this year we took him to the operating room for an open synovectomy and a polyliner exchange.  He has been on long-term suppressive antibiotics since then.  This week he developed swelling and redness in the knee.  We have recommended another incision and drainage of the knee but this time we will just go to washout the knee fully without changing of the polyliner.  He is at the point where the best recommendation for eradicating infection would be either another reexcision arthroplasty with fusing the knee eventually versus an above-knee amputation.  He understands this as well and also understands that today just temporizing the infection.  Procedure description: After informed consent was attained appropriate left knee was marked, the patient was brought to the op room and placed supine on the operative table.  General anesthesia was then obtained.  A nonsterile tractors placed around his upper left thigh  and his left thigh, knee, leg, and ankle were prepped and draped with DuraPrep and sterile drapes including sterile stockinette.  A timeout was called and he was then advised to correct patient to correct left knee.  The tourniquet was then elevated to 300 mm pressure.  We made an incision over the inferior aspect of his total knee incision from previous incisions and dissected down the knee joint care and a medial parapatellar arthrotomy out.  We did find a lot of fluid within the knee and a lot of this did appear to be infected fluid.  We totally decompressed the knee and suctioned out all the fluid.  We then ran 3 L of normal saline solution using pulsatile lavage throughout the knee joint itself.  Once that was done we dried the knee again and then placed vancomycin  powder within the arthrotomy.  The arthrotomy was then closed with interrupted #1 Ethibond suture followed by #1 Vicryl's deep tissue, 2-0 Vicryl the subcutaneous tissue interrupted 2-0 nylon on the skin incision to close that.  Well-padded sterile dressing was applied.  The patient was awakened, extubated and taken the recovery room in stable condition.

## 2023-11-05 DIAGNOSIS — T8454XA Infection and inflammatory reaction due to internal left knee prosthesis, initial encounter: Secondary | ICD-10-CM | POA: Diagnosis not present

## 2023-11-05 LAB — GLUCOSE, CAPILLARY: Glucose-Capillary: 260 mg/dL — ABNORMAL HIGH (ref 70–99)

## 2023-11-05 MED ORDER — OXYCODONE HCL 5 MG PO TABS: 5.0000 mg | ORAL_TABLET | Freq: Four times a day (QID) | ORAL | 0 refills | Status: DC | PRN

## 2023-11-05 NOTE — Discharge Instructions (Signed)
 Increase your activity as comfort allows. You can get your dressing wet in the shower daily. Change the dressing in a few days (4-5)

## 2023-11-05 NOTE — Plan of Care (Signed)

## 2023-11-05 NOTE — Plan of Care (Signed)
  Problem: Education: Goal: Knowledge of General Education information will improve Description: Including pain rating scale, medication(s)/side effects and non-pharmacologic comfort measures Outcome: Progressing   Problem: Health Behavior/Discharge Planning: Goal: Ability to manage health-related needs will improve Outcome: Progressing   Problem: Clinical Measurements: Goal: Ability to maintain clinical measurements within normal limits will improve Outcome: Progressing Goal: Will remain free from infection Outcome: Progressing Goal: Diagnostic test results will improve Outcome: Progressing Goal: Respiratory complications will improve Outcome: Progressing Goal: Cardiovascular complication will be avoided Outcome: Progressing   Problem: Nutrition: Goal: Adequate nutrition will be maintained Outcome: Progressing   Problem: Coping: Goal: Level of anxiety will decrease Outcome: Progressing   Problem: Elimination: Goal: Will not experience complications related to bowel motility Outcome: Progressing Goal: Will not experience complications related to urinary retention Outcome: Progressing   Problem: Pain Managment: Goal: General experience of comfort will improve and/or be controlled Outcome: Progressing   Problem: Skin Integrity: Goal: Risk for impaired skin integrity will decrease Outcome: Progressing

## 2023-11-05 NOTE — Progress Notes (Signed)
 Patient ID: [name], male   DOB: July 01, 1955, 68 y.o.   MRN: [id number] [name] is feeling good this morning.  He understands that we are suppressing his chronic infection and he will likely need further surgery in order to eradicate the infection.  His white blood count remains normal and he is very comfortable.  His dressing is clean and dry and I did change the dressing.  His knee feels better and looks better overall.  The plan is to discharge him home on his continued oral antibiotics today.

## 2023-11-05 NOTE — Discharge Summary (Signed)
 Patient ID: [name] MRN: [id number] DOB/AGE: 1955/06/28 68 y.o.  Admit date: 11/04/2023 Discharge date: 11/05/2023  Admission Diagnoses:  Principal Problem:   Chronic infection of left knee [facility]) Active Problems:   Infection of total left knee replacement [facility])   Discharge Diagnoses:  Same  Past Medical History:  Diagnosis Date   Arthritis    Diabetes mellitus without complication (HCC) 2019   Diabetes Type II   GERD (gastroesophageal reflux disease)    diet controlled   HTN (hypertension) 10/27/2018   Hyperlipidemia    Internal hemorrhoids 2014   noted on colonoscopy   MRSA infection 05/17/2023   MSSA (methicillin susceptible Staphylococcus aureus) infection 09/03/2020   Osteoarthritis of both knees    Prostate cancer (HCC)    prostate   Sigmoid diverticulosis 2014   Mild, noted on colonoscopy   Skin lesion 01/01/2021   Staphylococcus epidermidis infection 05/17/2023   Type 2 diabetes mellitus with other specified complication (HCC) 01/01/2021   Vaccine counseling 05/04/2021    Surgeries: Procedure(s): IRRIGATION AND DEBRIDEMENT KNEE on 11/04/2023   Consultants:   Discharged Condition: Improved  Hospital Course: [name] is an 68 y.o. male who was admitted 11/04/2023 for operative treatment ofChronic infection of left knee (HCC). Patient has severe unremitting pain that affects sleep, daily activities, and work/hobbies. After pre-op clearance the patient was taken to the operating room on 11/04/2023 and underwent  Procedure(s): IRRIGATION AND DEBRIDEMENT KNEE.    Patient was given perioperative antibiotics:  Anti-infectives (From admission, onward)    Start     Dose/Rate Route Frequency Ordered Stop   11/04/23 2300  vancomycin  (VANCOCIN ) IVPB 1000 mg/200 mL premix        1,000 mg 200 mL/hr over 60 Minutes Intravenous Every 12 hours 11/04/23 1625 11/05/23 2259   11/04/23 2200  sulfamethoxazole -trimethoprim  (BACTRIM  DS) 800-160 MG per tablet 1  tablet       Note to Pharmacy: To start after finishing the high dose 2 DS BID     1 tablet Oral 2 times daily 11/04/23 1625     11/04/23 1306  vancomycin  (VANCOCIN ) powder  Status:  Discontinued          As needed 11/04/23 1306 11/04/23 1333   11/04/23 1200  vancomycin  (VANCOCIN ) IVPB 1000 mg/200 mL premix        1,000 mg 200 mL/hr over 60 Minutes Intravenous  Once 11/04/23 1120 11/04/23 1805   11/04/23 1130  ceFAZolin  (ANCEF ) IVPB 2g/100 mL premix        2 g 200 mL/hr over 30 Minutes Intravenous On call to O.R. 11/04/23 1120 11/04/23 1259   11/04/23 1130  vancomycin  (VANCOCIN ) IVPB 1000 mg/200 mL premix  Status:  Discontinued        1,000 mg 200 mL/hr over 60 Minutes Intravenous On call to O.R. 11/04/23 1120 11/04/23 1122        Patient was given sequential compression devices, early ambulation, and chemoprophylaxis to prevent DVT.  Inpatient Morphine  Milligram Equivalents Per Day 8/15 - 8/16   Values displayed are in units of MME/Day    Order Start / End Date Yesterday Today    oxyCODONE  (Oxy IR/ROXICODONE ) immediate release tablet 5 mg 8/15 - 8/15 7.5 of Unknown --    oxyCODONE  (ROXICODONE ) 5 MG/5ML solution 5 mg 8/15 - 8/15 0 of Unknown --      Group total: 7.5 of Unknown     fentaNYL  (SUBLIMAZE ) injection 50-100 mcg 8/15 - 8/15 0 of 15-30 --  fentaNYL  (SUBLIMAZE ) injection 25-50 mcg 8/15 - 8/15 45 of 45-90 --    fentaNYL  (SUBLIMAZE ) injection 8/15 - 8/15 *30 of 30 --    HYDROmorphone  (DILAUDID ) injection 0.5-1 mg 8/15 - No end date 0 of 20-40 0 of 60-120    oxyCODONE  (Oxy IR/ROXICODONE ) immediate release tablet 5-10 mg 8/15 - No end date 15 of 15-30 15 of 45-90    oxyCODONE  (Oxy IR/ROXICODONE ) immediate release tablet 10-15 mg 8/15 - No end date 0 of 30-45 0 of 90-135    HYDROmorphone  (DILAUDID ) injection 0.5 mg 8/15 - 8/15 40 of 40 --    Daily Totals  * 137.5 of Unknown (at least 195-305) 15 of 195-345  *One-Step medication  Calculation Errors     Order Type Date Details    oxyCODONE  (Oxy IR/ROXICODONE ) immediate release tablet 5 mg Ordered Dose -- Insufficient frequency information   oxyCODONE  (ROXICODONE ) 5 MG/5ML solution 5 mg Ordered Dose -- Insufficient frequency information            Patient benefited maximally from hospital stay and there were no complications.    Recent vital signs: Patient Vitals for the past 24 hrs:  BP Temp Temp src Pulse Resp SpO2 Height Weight  11/05/23 0644 108/62 (!) 97.5 F (36.4 C) Oral 64 16 94 % -- --  11/05/23 0154 (!) 104/55 (!) 97.4 F (36.3 C) -- 62 16 99 % -- --  11/04/23 2023 116/60 97.7 F (36.5 C) -- 66 16 95 % -- --  11/04/23 2000 -- -- -- -- -- -- 5' 9 (1.753 m) --  11/04/23 1600 104/66 (!) 97.3 F (36.3 C) Oral (!) 55 (!) 8 93 % -- --  11/04/23 1545 119/63 -- -- 64 14 93 % -- --  11/04/23 1530 111/66 -- -- (!) 57 (!) 9 93 % -- --  11/04/23 1515 119/64 -- -- 62 11 92 % -- --  11/04/23 1500 127/62 -- -- 63 13 94 % -- --  11/04/23 1445 131/72 -- -- 61 14 95 % -- --  11/04/23 1430 126/66 -- -- 62 12 95 % -- --  11/04/23 1415 (!) 144/79 -- -- 66 15 97 % -- --  11/04/23 1400 128/67 -- -- 69 12 95 % -- --  11/04/23 1345 122/84 (!) 97.3 F (36.3 C) -- 72 13 100 % -- --  11/04/23 1148 -- -- -- -- -- -- -- 93 kg  11/04/23 1118 (!) 153/67 98.4 F (36.9 C) Oral 68 16 97 % -- --     Recent laboratory studies:  Recent Labs    11/04/23 1144  WBC 5.1  HGB 11.3*  HCT 34.0*  PLT 160  NA 137  K 4.1  CL 101  CO2 26  BUN 21  CREATININE 1.11  GLUCOSE 167*  CALCIUM  9.3     Discharge Medications:   Allergies as of 11/05/2023       Reactions   Rifampin  Nausea Only, Other (See Comments)   Skin crawling- bodyache        Medication List     TAKE these medications    celecoxib  100 MG capsule Commonly known as: CeleBREX  Take 1 capsule (100 mg total) by mouth daily.   chlorhexidine  4 % external liquid Commonly known as: HIBICLENS  Apply 15 mLs (1 Application total) topically as directed for  30 doses. Use as directed daily for 5 days every other week for 6 weeks.   escitalopram  20 MG tablet Commonly known as: LEXAPRO  Take  20 mg by mouth daily.   FreeStyle Libre 14 Day Sensor Misc SMARTSIG:Topical Every 10 Days   insulin  aspart 100 UNIT/ML injection Commonly known as: novoLOG  Inject 10-24 Units into the skin 3 (three) times daily with meals. Per sliding scale   losartan -hydrochlorothiazide  50-12.5 MG tablet Commonly known as: HYZAAR TAKE (1) TABLET BY MOUTH ONCE DAILY.   mupirocin  ointment 2 % Commonly known as: BACTROBAN  Place 1 Application into the nose 2 (two) times daily for 60 doses. Use as directed 2 times daily for 5 days every other week for 6 weeks.   oxyCODONE  5 MG immediate release tablet Commonly known as: Oxy IR/ROXICODONE  Take 1 tablet (5 mg total) by mouth every 6 (six) hours as needed for severe pain (pain score 7-10).   rosuvastatin  20 MG tablet Commonly known as: CRESTOR  TAKE 1 TABLET BY MOUTH EVERY DAY   sulfamethoxazole -trimethoprim  800-160 MG tablet Commonly known as: BACTRIM  DS Take 1 tablet by mouth 2 (two) times daily. To start after finishing the high dose 2 DS BID   Tresiba  FlexTouch 200 UNIT/ML FlexTouch Pen Generic drug: insulin  degludec Inject 92 Units into the skin as directed.        Diagnostic Studies: XR Knee 1-2 Views Left Result Date: 11/02/2023 2 views of the left knee show a revision arthroplasty with no complicating features.   Disposition: Discharge disposition: 01-Home or Self Care          Follow-up Information     Vernetta Lonni GRADE, MD. Schedule an appointment as soon as possible for a visit in 2 week(s).   Specialty: Orthopedic Surgery Contact information: [address] [place] [address] [phone]                  Signed: Lonni GRADE Vernetta 11/05/2023, 8:15 AM

## 2023-11-05 NOTE — Progress Notes (Signed)
 AVS reviewed w/ pt and wife who verbalized an understanding. PIV removed as noted. Pt dressed for d/c to home. To lobby via w/c.

## 2023-11-06 LAB — HEMOGLOBIN A1C
Hgb A1c MFr Bld: 8.1 % — ABNORMAL HIGH (ref 4.8–5.6)
Mean Plasma Glucose: 186 mg/dL

## 2023-11-06 NOTE — Anesthesia Postprocedure Evaluation (Signed)
 Anesthesia Post Note  Patient: [name]  Procedure(s) Performed: IRRIGATION AND DEBRIDEMENT KNEE (Left: Knee)     Patient location during evaluation: PACU Anesthesia Type: General Level of consciousness: awake and alert Pain management: pain level controlled Vital Signs Assessment: post-procedure vital signs reviewed and stable Respiratory status: spontaneous breathing, nonlabored ventilation, respiratory function stable and patient connected to nasal cannula oxygen Cardiovascular status: blood pressure returned to baseline and stable Postop Assessment: no apparent nausea or vomiting Anesthetic complications: no   No notable events documented.        Lynwood MARLA Cornea

## 2023-11-07 ENCOUNTER — Encounter (HOSPITAL_COMMUNITY): Payer: Self-pay | Admitting: Orthopaedic Surgery

## 2023-11-17 ENCOUNTER — Ambulatory Visit (INDEPENDENT_AMBULATORY_CARE_PROVIDER_SITE_OTHER): Admitting: Orthopaedic Surgery

## 2023-11-17 ENCOUNTER — Encounter: Payer: Self-pay | Admitting: Orthopaedic Surgery

## 2023-11-17 DIAGNOSIS — T8454XD Infection and inflammatory reaction due to internal left knee prosthesis, subsequent encounter: Secondary | ICD-10-CM

## 2023-11-17 NOTE — Progress Notes (Signed)
 The patient is a 68 year old gentleman with a history of an infected left total knee arthroplasty.  He had this knee replaced removed sometime ago and then a staged revision after having an antibiotic spacer placed for a while and then removed and it was felt the knee was sterile.  A revision arthroplasty was then performed.  Earlier this year he had to be taken back to the operating room in January for an open arthrotomy with a synovectomy and polyliner exchange.  He continues to present with findings worrisome for chronic indolent infection and his revision arthroplasty.  Recently we taken back to the operating room for repeat incision and drainage/irrigation debridement of his left knee and he is here in follow-up for the first time since that surgery his most recent hemoglobin A1c 2 weeks ago was still elevated at 8.1.  He has had a difficult time with diabetic control.  On exam of his left knee the sutures are intact.  There is no drainage.  The swelling his gone down and there is no significant redness.  The plan is to leave the sutures until next week given the fact that the dressings and other type of irritated skin too much and I would not want to put Steri-Strips on the skin.  We will see him back on Wednesday, September 3 for removal of sutures.  I did talk to him in length in detail about this having an indolent infection and have lectured him again about his blood glucose.  He says is under better control now.  He will likely need some type of excision arthroplasty in the future and even an above-knee amputation.

## 2023-11-23 ENCOUNTER — Ambulatory Visit (INDEPENDENT_AMBULATORY_CARE_PROVIDER_SITE_OTHER): Admitting: Physician Assistant

## 2023-11-23 ENCOUNTER — Encounter: Payer: Self-pay | Admitting: Physician Assistant

## 2023-11-23 DIAGNOSIS — T8454XD Infection and inflammatory reaction due to internal left knee prosthesis, subsequent encounter: Secondary | ICD-10-CM

## 2023-11-23 MED ORDER — OXYCODONE HCL 5 MG PO TABS: 5.0000 mg | ORAL_TABLET | Freq: Four times a day (QID) | ORAL | 0 refills | Status: DC | PRN

## 2023-11-23 NOTE — Progress Notes (Signed)
 HPI: [name] returns today status post I&D left knee 11/04/2023.  He is here today mostly for suture removal.  He states he needs a refill on his pain medication.  He states that his stiffness becomes worse in the evening.  He is on chronic Bactrim  due to the chronic infection.  Denies any fevers or chills.  Physical exam: Left knee full extension flexion to approximately 85 degrees.  Surgical incisions healing well there is no gross signs of infection.  No drainage.  Calf supple nontender.  Diffuse swelling throughout the left leg compared to the right.  Impression: Left total knee infection chronic  Plan: Sutures removed Steri-Strips applied.  He will follow-up with us  in 2 weeks is doing overall.  He continues Bactrim .  Follow-up with us  sooner if there is any questions or concerns.

## 2023-12-07 ENCOUNTER — Ambulatory Visit (INDEPENDENT_AMBULATORY_CARE_PROVIDER_SITE_OTHER): Admitting: Physician Assistant

## 2023-12-07 ENCOUNTER — Encounter: Payer: Self-pay | Admitting: Physician Assistant

## 2023-12-07 DIAGNOSIS — T8454XD Infection and inflammatory reaction due to internal left knee prosthesis, subsequent encounter: Secondary | ICD-10-CM

## 2023-12-07 NOTE — Progress Notes (Signed)
 HPI: [name] returns today follow-up status post I&D left knee 11/04/2023.  He states he is concerned about a knot in his back on the medial anterior aspect of the knee.  He notes that he has his best range of motion and decreased swelling first thing in the morning.  He continues to take oxycodone  as needed.  He is on Bactrim  he has suppression.  Reports that his glucose levels have been in the 140s.  Denies any fevers chills nausea vomiting.  Physical exam: General Well-developed well-nourished male who is able to ambulate without any assistive device. Left knee full extension flexion to approximately 85 degrees.  Surgical incisions well-healed.  No signs of gross infection.  Calf supple.  Dorsiflexion plantarflexion at the ankle intact.  Impression: Chronic left total knee infection  Plan: Will see him back in 3 months sooner if there is any signs of infection.  Questions were encouraged and answered by Dr. Vernetta and myself.  Will continue Bactrim  DS per infectious disease.  Questions were encouraged and answered.

## 2023-12-14 ENCOUNTER — Ambulatory Visit: Admitting: Orthopaedic Surgery

## 2023-12-14 ENCOUNTER — Encounter: Payer: Self-pay | Admitting: Orthopaedic Surgery

## 2023-12-14 DIAGNOSIS — T8454XD Infection and inflammatory reaction due to internal left knee prosthesis, subsequent encounter: Secondary | ICD-10-CM

## 2023-12-14 NOTE — Progress Notes (Signed)
 The patient is well-known to us .  He has recurrent infection involving the right knee revision arthroplasty.  We last performed an I&D of this knee back on October 15.  He is on chronic antibiotics and has had recurrence of the infection as well.  This point we have talked in length in detail about an excision arthroplasty and even the potential for an above-knee amputation.  He understands that taking out revision components may do a lot more than destruction to the bone to the point that knee revision would not be advisable or possible at all.  We could potentially remove all components and place antibiotic spacer but only if the bone will allow this.  We definitely need to perform an incision and drainage and placement of a new antibiotic spacer if he wants us  to try to maintain the components.  Exam today the knee is swollen and warm and he has significant lymphedema which is chronic but the left knee does show continued evidence of infection.  We will proceed to surgery this Friday for the above-mentioned surgery as a relates to his left knee.  Again, he understands the only way to fully eradicate infection we remove everything and he may end up only benefiting from an above-knee amputation but a lot will depend on what we find out intraoperatively.  We will work on getting her scheduled in 2 days from now.

## 2023-12-15 ENCOUNTER — Encounter (HOSPITAL_COMMUNITY): Payer: Self-pay | Admitting: Orthopaedic Surgery

## 2023-12-15 ENCOUNTER — Other Ambulatory Visit: Payer: Self-pay

## 2023-12-15 ENCOUNTER — Other Ambulatory Visit: Payer: Self-pay | Admitting: Physician Assistant

## 2023-12-15 DIAGNOSIS — Z01818 Encounter for other preprocedural examination: Secondary | ICD-10-CM

## 2023-12-15 NOTE — H&P (Signed)
 [name] is an 68 y.o. male.   Chief Complaint: Redness and swelling left knee HPI: The patient is a 68 year old gentleman with a complex history as a relates to his left knee.  He underwent a primary left total knee arthroplasty back in December 2020 secondary to severe arthritis of left knee.  In May 2022 he underwent an irrigation debridement of left knee with synovectomy and poly exchange.  Eventually he had to have all the components removed and placement of a temporary antibiotic spacer.  This was done in 2023.  Eventually he had removal of the antibiotic spacer and a two-stage revision with revised components placed in his knee after was felt that the knee itself was sterile and the infection had been eradicated.  He has now presented again with continued obvious infection involving his left total knee revision.  He has had at least 2 incision and drainages in a polyliner exchange.  His most recent I&D was in August.  He presented to clinic yesterday with worsening redness of the left knee.  At this point we have talked about the likely need to excise all components.  However he understands this can be quite difficult and can cause fracture of the bone and inability to ever replace the knee again.  There is the potential at some point he may need an above-knee amputation.  He at least will consent to surgery for an open arthrotomy with incision and drainage of his left knee revision arthroplasty and a polyliner exchange with placement of antibiotic beads and PICC line placement with continued IV antibiotics for 6 weeks versus having all components removed and placement of antibiotic spacer again if we are able to do this easily.  Past Medical History:  Diagnosis Date   Arthritis    Diabetes mellitus without complication (HCC) 2019   Diabetes Type II   GERD (gastroesophageal reflux disease)    diet controlled   HTN (hypertension) 10/27/2018   Hyperlipidemia    Internal hemorrhoids 2014    noted on colonoscopy   MRSA infection 05/17/2023   MSSA (methicillin susceptible Staphylococcus aureus) infection 09/03/2020   Osteoarthritis of both knees    Prostate cancer (HCC)    prostate   Sigmoid diverticulosis 2014   Mild, noted on colonoscopy   Skin lesion 01/01/2021   Staphylococcus epidermidis infection 05/17/2023   Type 2 diabetes mellitus with other specified complication (HCC) 01/01/2021   Vaccine counseling 05/04/2021    Past Surgical History:  Procedure Laterality Date   CARPAL TUNNEL RELEASE Right 02/22/2019   Procedure: CARPAL TUNNEL RELEASE;  Surgeon: Margrette Taft BRAVO, MD;  Location: [facility];  Service: Orthopedics;  Laterality: Right;   COLONOSCOPY N/A 09/13/2012   Procedure: COLONOSCOPY;  Surgeon: Margo LITTIE Haddock, MD;  Location: [facility];  Service: Endoscopy;  Laterality: N/A;  9:30 AM   CYSTOSCOPY WITH URETHRAL DILATATION N/A 06/02/2020   Procedure: CYSTOSCOPY WITH URETHRAL DILATATION;  Surgeon: Renda Glance, MD;  Location: [facility];  Service: Urology;  Laterality: N/A;  ONLY NEEDS 30 MIN   EXCISIONAL TOTAL KNEE ARTHROPLASTY WITH ANTIBIOTIC SPACERS Left 05/15/2021   Procedure: LEFT TOTAL KNEE IRRIGATION AND DEBRIDEMENT, EXCISION LEFT TOTAL KNEE ARTHROPLASTY WITH ANTIBIOTIC SPACERS;  Surgeon: Vernetta Lonni GRADE, MD;  Location: [facility];  Service: Orthopedics;  Laterality: Left;   HERNIA REPAIR     20 years ago   I & D KNEE WITH POLY EXCHANGE Left 08/14/2020   Procedure: IRRIGATION AND DEBRIDEMENT LEFT KNEE with synovectomy WITH POLY  EXCHANGE;  Surgeon: Vernetta Lonni GRADE, MD;  Location: [facility] OR;  Service: Orthopedics;  Laterality: Left;   I & D KNEE WITH POLY EXCHANGE Left 03/25/2023   Procedure: INCISION AND DRAINAGE LEFT KNEE, SYNOVECTOMY,  POLY-LINER EXCHANGE;  Surgeon: Vernetta Lonni GRADE, MD;  Location: [facility];  Service: Orthopedics;  Laterality: Left;   IRRIGATION AND DEBRIDEMENT KNEE Left 11/04/2023   Procedure: IRRIGATION AND DEBRIDEMENT KNEE;   Surgeon: Vernetta Lonni GRADE, MD;  Location: [facility];  Service: Orthopedics;  Laterality: Left;  INCISION AND DRAINAGE LEFT KNEE   KNEE ARTHROSCOPY Left    15 years ago   LYMPHADENECTOMY Bilateral 05/30/2017   Procedure: LYMPHADENECTOMY, PELVIC EXTENDED;  Surgeon: Renda Glance, MD;  Location: [facility];  Service: Urology;  Laterality: Bilateral;   ROBOT ASSISTED LAPAROSCOPIC RADICAL PROSTATECTOMY N/A 05/30/2017   Procedure: XI ROBOTIC ASSISTED LAPAROSCOPIC RADICAL PROSTATECTOMY LEVEL 3;  Surgeon: Renda Glance, MD;  Location: [facility];  Service: Urology;  Laterality: N/A;   SHOULDER ARTHROSCOPY Right    15 years ago   TOTAL KNEE ARTHROPLASTY Left 03/21/2019   Procedure: LEFT TOTAL KNEE ARTHROPLASTY;  Surgeon: Vernetta Lonni GRADE, MD;  Location: [facility];  Service: Orthopedics;  Laterality: Left;   TOTAL KNEE REVISION Left 08/14/2021   Procedure: REMOVAL OF ANTIBIOTIC SPACER AND LEFT TOTAL KNEE REVISION ARTHROPLASTY;  Surgeon: Vernetta Lonni GRADE, MD;  Location: [facility];  Service: Orthopedics;  Laterality: Left;    Family History  Problem Relation Age of Onset   Cancer Mother        small cell lung   Social History:  reports that he has never smoked. He has never used smokeless tobacco. He reports that he does not drink alcohol and does not use drugs.  Allergies:  Allergies  Allergen Reactions   Rifampin  Nausea Only and Other (See Comments)    Skin crawling- bodyache    No medications prior to admission.    No results found for this or any previous visit (from the past 48 hours). No results found.  Review of Systems  Height 5' 10 (1.778 m), weight 90.7 kg. Physical Exam   Assessment/Plan Chronic infection and recurrent infection involving left knee revision arthroplasty  Given the worsening infection with the patient's left knee, we have recommended proceeding to surgery for an open arthrotomy and assessing the components to see if they are loose and to see if they can  be repaired removed without significant distraction to the bone.  The plan is to washout the knee and perform a synovectomy and then assess the components to see if they can potentially be removed without significant destruction of the bone and placement of another antibiotic spacer.  A lot of this will be determined intraoperatively but it is medically necessary to proceed to surgery given the continued infection with his left knee that is worsening to the point that we are worried about it creating a systemic issue.  Lonni GRADE Vernetta, MD 12/15/2023, 4:34 PM

## 2023-12-15 NOTE — Progress Notes (Addendum)
 For Anesthesia: PCP - Searcy Overcast, MD  Cardiologist - Ladona Heinz, MD   Bowel Prep reminder: N/A  Chest x-ray - N/A EKG - 03/18/23 in [facility] Stress Test - 12/05/2018 in [facility] ECHO - 12/07/2018 in [facility] Cardiac Cath - N/A Pacemaker/ICD device last checked: N/A Pacemaker orders received: N/A Device Rep notified: N/A  Spinal Cord Stimulator: N/A  Sleep Study - N/A CPAP - N/A  Fasting Blood Sugar - 100's Checks Blood Sugar _Freestyle Libre Date and result of last Hgb A1c- 11/04/23 8.1 in chl  Last dose of GLP1 agonist- N/A GLP1 instructions: Hold 7 days prior to schedule (Hold 24 hours-daily)   Last dose of SGLT-2 inhibitors- N/A SGLT-2 instructions: Hold 72 hours prior to surgery  Blood Thinner Instructions:N/A Last Dose:N/A Time last taken:N/A  Aspirin  Instructions:N/A Last Dose:N/A Time last taken:N/A  Activity level:  Able to exercise without chest pain and/or shortness of breath     Anesthesia review: HTN, DM  Patient denies shortness of breath, fever, cough and chest pain at PAT appointment   Patient verbalized understanding of instructions that were reviewed over the telephone.

## 2023-12-15 NOTE — Progress Notes (Signed)
 Attempted to obtain medical history via telephone, unable to reach at this time. HIPAA compliant voicemail message left requesting return call to pre surgical testing department.

## 2023-12-16 ENCOUNTER — Encounter (HOSPITAL_COMMUNITY): Payer: Self-pay | Admitting: Orthopaedic Surgery

## 2023-12-16 ENCOUNTER — Inpatient Hospital Stay (HOSPITAL_COMMUNITY)

## 2023-12-16 ENCOUNTER — Inpatient Hospital Stay (HOSPITAL_COMMUNITY): Payer: Self-pay | Admitting: Physician Assistant

## 2023-12-16 ENCOUNTER — Encounter (HOSPITAL_COMMUNITY)
Admission: RE | Disposition: A | Discharge status: Home / Self Care | Payer: Self-pay | Source: Home / Self Care | Attending: Orthopaedic Surgery

## 2023-12-16 ENCOUNTER — Inpatient Hospital Stay (HOSPITAL_COMMUNITY)
Admission: RE | Admit: 2023-12-16 | Discharge: 2023-12-22 | DRG: 464 | Disposition: A | Discharge status: Home Health | Attending: Orthopaedic Surgery | Admitting: Orthopaedic Surgery

## 2023-12-16 DIAGNOSIS — Y831 Surgical operation with implant of artificial internal device as the cause of abnormal reaction of the patient, or of later complication, without mention of misadventure at the time of the procedure: Secondary | ICD-10-CM | POA: Diagnosis present

## 2023-12-16 DIAGNOSIS — Z794 Long term (current) use of insulin: Secondary | ICD-10-CM

## 2023-12-16 DIAGNOSIS — Z792 Long term (current) use of antibiotics: Secondary | ICD-10-CM

## 2023-12-16 DIAGNOSIS — T8454XA Infection and inflammatory reaction due to internal left knee prosthesis, initial encounter: Principal | ICD-10-CM | POA: Diagnosis present

## 2023-12-16 DIAGNOSIS — D62 Acute posthemorrhagic anemia: Secondary | ICD-10-CM | POA: Diagnosis not present

## 2023-12-16 DIAGNOSIS — M170 Bilateral primary osteoarthritis of knee: Secondary | ICD-10-CM | POA: Diagnosis present

## 2023-12-16 DIAGNOSIS — I10 Essential (primary) hypertension: Secondary | ICD-10-CM

## 2023-12-16 DIAGNOSIS — E785 Hyperlipidemia, unspecified: Secondary | ICD-10-CM | POA: Diagnosis present

## 2023-12-16 DIAGNOSIS — Z8546 Personal history of malignant neoplasm of prostate: Secondary | ICD-10-CM | POA: Diagnosis not present

## 2023-12-16 DIAGNOSIS — E1169 Type 2 diabetes mellitus with other specified complication: Secondary | ICD-10-CM | POA: Diagnosis present

## 2023-12-16 DIAGNOSIS — E119 Type 2 diabetes mellitus without complications: Secondary | ICD-10-CM | POA: Diagnosis present

## 2023-12-16 DIAGNOSIS — T8454XS Infection and inflammatory reaction due to internal left knee prosthesis, sequela: Secondary | ICD-10-CM | POA: Diagnosis not present

## 2023-12-16 DIAGNOSIS — Z79899 Other long term (current) drug therapy: Secondary | ICD-10-CM | POA: Diagnosis not present

## 2023-12-16 DIAGNOSIS — I890 Lymphedema, not elsewhere classified: Secondary | ICD-10-CM | POA: Diagnosis present

## 2023-12-16 DIAGNOSIS — K219 Gastro-esophageal reflux disease without esophagitis: Secondary | ICD-10-CM | POA: Diagnosis present

## 2023-12-16 DIAGNOSIS — Z01818 Encounter for other preprocedural examination: Secondary | ICD-10-CM

## 2023-12-16 DIAGNOSIS — T8454XD Infection and inflammatory reaction due to internal left knee prosthesis, subsequent encounter: Secondary | ICD-10-CM

## 2023-12-16 DIAGNOSIS — Z96652 Presence of left artificial knee joint: Secondary | ICD-10-CM | POA: Diagnosis not present

## 2023-12-16 DIAGNOSIS — M009 Pyogenic arthritis, unspecified: Principal | ICD-10-CM

## 2023-12-16 DIAGNOSIS — B957 Other staphylococcus as the cause of diseases classified elsewhere: Secondary | ICD-10-CM | POA: Diagnosis present

## 2023-12-16 HISTORY — PX: I & D KNEE WITH POLY EXCHANGE: SHX5024

## 2023-12-16 HISTORY — PX: EXCISIONAL TOTAL KNEE ARTHROPLASTY WITH ANTIBIOTIC SPACERS: SHX5827

## 2023-12-16 LAB — SURGICAL PCR SCREEN
MRSA, PCR: NEGATIVE
Staphylococcus aureus: NEGATIVE

## 2023-12-16 LAB — GLUCOSE, CAPILLARY
Glucose-Capillary: 135 mg/dL — ABNORMAL HIGH (ref 70–99)
Glucose-Capillary: 118 mg/dL — ABNORMAL HIGH (ref 70–99)
Glucose-Capillary: 156 mg/dL — ABNORMAL HIGH (ref 70–99)
Glucose-Capillary: 260 mg/dL — ABNORMAL HIGH (ref 70–99)

## 2023-12-16 LAB — CBC
HCT: 34 % — ABNORMAL LOW (ref 39.0–52.0)
Hemoglobin: 10.9 g/dL — ABNORMAL LOW (ref 13.0–17.0)
MCH: 26.9 pg (ref 26.0–34.0)
MCHC: 32.1 g/dL (ref 30.0–36.0)
MCV: 84 fL (ref 80.0–100.0)
Platelets: 166 K/uL (ref 150–400)
RBC: 4.05 MIL/uL — ABNORMAL LOW (ref 4.22–5.81)
RDW: 14.4 % (ref 11.5–15.5)
WBC: 4.7 K/uL (ref 4.0–10.5)
nRBC: 0 % (ref 0.0–0.2)

## 2023-12-16 LAB — BASIC METABOLIC PANEL WITH GFR
Anion gap: 12 (ref 5–15)
BUN: 21 mg/dL (ref 8–23)
CO2: 23 mmol/L (ref 22–32)
Calcium: 9.2 mg/dL (ref 8.9–10.3)
Chloride: 103 mmol/L (ref 98–111)
Creatinine, Ser: 1.17 mg/dL (ref 0.61–1.24)
GFR, Estimated: >60 mL/min (ref >60)
Glucose, Bld: 138 mg/dL — ABNORMAL HIGH (ref 70–99)
Potassium: 4.2 mmol/L (ref 3.5–5.1)
Sodium: 137 mmol/L (ref 135–145)

## 2023-12-16 SURGERY — IRRIGATION AND DEBRIDEMENT KNEE WITH POLY EXCHANGE
Anesthesia: Monitor Anesthesia Care | Site: Knee | Laterality: Left

## 2023-12-16 MED ORDER — PROPOFOL 500 MG/50ML IV EMUL
INTRAVENOUS | Status: DC | PRN
  Administered 2023-12-16: 75 ug/kg/min via INTRAVENOUS

## 2023-12-16 MED ORDER — GABAPENTIN 100 MG PO CAPS
100.0000 mg | ORAL_CAPSULE | Freq: Three times a day (TID) | ORAL | Status: DC
  Administered 2023-12-16 – 2023-12-22 (×17): 100 mg via ORAL
  Filled 2023-12-16 (×17): qty 1

## 2023-12-16 MED ORDER — METHOCARBAMOL 1000 MG/10ML IJ SOLN
INTRAMUSCULAR | Status: AC
Start: 2023-12-16 — End: 2023-12-16
  Filled 2023-12-16: qty 10

## 2023-12-16 MED ORDER — PROPOFOL 10 MG/ML IV BOLUS
INTRAVENOUS | Status: AC
  Filled 2023-12-16: qty 20

## 2023-12-16 MED ORDER — VANCOMYCIN HCL 1000 MG IV SOLR
INTRAVENOUS | Status: AC
  Filled 2023-12-16: qty 20

## 2023-12-16 MED ORDER — METHOCARBAMOL 1000 MG/10ML IJ SOLN
500.0000 mg | Freq: Four times a day (QID) | INTRAMUSCULAR | Status: DC | PRN
  Administered 2023-12-16: 500 mg via INTRAVENOUS

## 2023-12-16 MED ORDER — HYDROMORPHONE HCL 1 MG/ML IJ SOLN
0.5000 mg | INTRAMUSCULAR | Status: DC | PRN
  Administered 2023-12-17: 1 mg via INTRAVENOUS
  Filled 2023-12-16: qty 1

## 2023-12-16 MED ORDER — STERILE WATER FOR IRRIGATION IR SOLN
Status: DC | PRN
  Administered 2023-12-16: 2000 mL

## 2023-12-16 MED ORDER — METOCLOPRAMIDE HCL 5 MG PO TABS: 5.0000 mg | ORAL_TABLET | Freq: Three times a day (TID) | ORAL | Status: DC | PRN

## 2023-12-16 MED ORDER — LACTATED RINGERS IV SOLN
INTRAVENOUS | Status: DC
Start: 2023-12-16 — End: 2023-12-16

## 2023-12-16 MED ORDER — POVIDONE-IODINE 10 % EX SWAB
2.0000 | Freq: Once | CUTANEOUS | Status: AC
  Administered 2023-12-16: 2 via TOPICAL

## 2023-12-16 MED ORDER — INSULIN ASPART 100 UNIT/ML ~~LOC~~ SOLN: 20.0000 [IU] | Freq: Three times a day (TID) | SUBCUTANEOUS | Status: DC

## 2023-12-16 MED ORDER — PHENYLEPHRINE 80 MCG/ML (10ML) SYRINGE FOR IV PUSH (FOR BLOOD PRESSURE SUPPORT)
PREFILLED_SYRINGE | INTRAVENOUS | Status: AC
  Filled 2023-12-16: qty 10

## 2023-12-16 MED ORDER — HYDROMORPHONE HCL 2 MG PO TABS
4.0000 mg | ORAL_TABLET | ORAL | Status: DC | PRN
  Administered 2023-12-16 – 2023-12-22 (×16): 4 mg via ORAL
  Filled 2023-12-16 (×17): qty 2

## 2023-12-16 MED ORDER — VANCOMYCIN HCL 1000 MG IV SOLR
INTRAVENOUS | Status: AC
  Filled 2023-12-16: qty 60

## 2023-12-16 MED ORDER — LIDOCAINE HCL (PF) 2 % IJ SOLN
INTRAMUSCULAR | Status: AC
  Filled 2023-12-16: qty 5

## 2023-12-16 MED ORDER — VANCOMYCIN HCL 1000 MG IV SOLR
INTRAVENOUS | Status: DC | PRN
  Administered 2023-12-16: 1000 mg via TOPICAL
  Administered 2023-12-16: 3000 mg

## 2023-12-16 MED ORDER — ACETAMINOPHEN 500 MG PO TABS
1000.0000 mg | ORAL_TABLET | Freq: Once | ORAL | Status: AC
  Administered 2023-12-16: 1000 mg via ORAL
  Filled 2023-12-16: qty 2

## 2023-12-16 MED ORDER — PHENYLEPHRINE HCL-NACL 20-0.9 MG/250ML-% IV SOLN
INTRAVENOUS | Status: DC | PRN
  Administered 2023-12-16: 25 ug/min via INTRAVENOUS

## 2023-12-16 MED ORDER — LOSARTAN POTASSIUM-HCTZ 50-12.5 MG PO TABS: 1.0000 | ORAL_TABLET | Freq: Every day | ORAL | Status: DC

## 2023-12-16 MED ORDER — OXYCODONE HCL 5 MG PO TABS
5.0000 mg | ORAL_TABLET | ORAL | Status: DC | PRN
  Administered 2023-12-17 (×2): 5 mg via ORAL
  Administered 2023-12-17 – 2023-12-22 (×3): 10 mg via ORAL
  Filled 2023-12-16 (×6): qty 2

## 2023-12-16 MED ORDER — TRANEXAMIC ACID-NACL 1000-0.7 MG/100ML-% IV SOLN
1000.0000 mg | INTRAVENOUS | Status: AC
  Administered 2023-12-16: 1000 mg via INTRAVENOUS
  Filled 2023-12-16: qty 100

## 2023-12-16 MED ORDER — ACETAMINOPHEN 325 MG PO TABS: 325.0000 mg | ORAL_TABLET | Freq: Four times a day (QID) | ORAL | Status: DC | PRN

## 2023-12-16 MED ORDER — ONDANSETRON HCL 4 MG/2ML IJ SOLN
INTRAMUSCULAR | Status: DC | PRN
  Administered 2023-12-16: 4 mg via INTRAVENOUS

## 2023-12-16 MED ORDER — OXYCODONE HCL 5 MG PO TABS
10.0000 mg | ORAL_TABLET | ORAL | Status: DC | PRN
  Administered 2023-12-16 – 2023-12-19 (×6): 15 mg via ORAL
  Administered 2023-12-20 (×3): 10 mg via ORAL
  Administered 2023-12-20: 15 mg via ORAL
  Administered 2023-12-21 – 2023-12-22 (×4): 10 mg via ORAL
  Filled 2023-12-16 (×2): qty 3
  Filled 2023-12-16 (×3): qty 2
  Filled 2023-12-16 (×4): qty 3
  Filled 2023-12-16 (×4): qty 2
  Filled 2023-12-16: qty 3

## 2023-12-16 MED ORDER — BUPIVACAINE IN DEXTROSE 0.75-8.25 % IT SOLN
INTRATHECAL | Status: DC | PRN
  Administered 2023-12-16: 1.8 mL via INTRATHECAL

## 2023-12-16 MED ORDER — PANTOPRAZOLE SODIUM 40 MG PO TBEC
40.0000 mg | DELAYED_RELEASE_TABLET | Freq: Every day | ORAL | Status: DC
  Administered 2023-12-16 – 2023-12-22 (×7): 40 mg via ORAL
  Filled 2023-12-16 (×7): qty 1

## 2023-12-16 MED ORDER — INSULIN GLARGINE 100 UNIT/ML ~~LOC~~ SOLN
90.0000 [IU] | Freq: Every day | SUBCUTANEOUS | Status: DC
  Administered 2023-12-17 – 2023-12-22 (×6): 90 [IU] via SUBCUTANEOUS
  Filled 2023-12-16 (×6): qty 0.9

## 2023-12-16 MED ORDER — ROPIVACAINE HCL 5 MG/ML IJ SOLN
INTRAMUSCULAR | Status: DC | PRN
  Administered 2023-12-16: 30 mL via PERINEURAL

## 2023-12-16 MED ORDER — METOCLOPRAMIDE HCL 5 MG/ML IJ SOLN: 5.0000 mg | Freq: Three times a day (TID) | INTRAMUSCULAR | Status: DC | PRN

## 2023-12-16 MED ORDER — 0.9 % SODIUM CHLORIDE (POUR BTL) OPTIME
TOPICAL | Status: DC | PRN
  Administered 2023-12-16: 1000 mL

## 2023-12-16 MED ORDER — INSULIN ASPART 100 UNIT/ML IJ SOLN
0.0000 [IU] | Freq: Three times a day (TID) | INTRAMUSCULAR | Status: DC
  Administered 2023-12-17 (×2): 5 [IU] via SUBCUTANEOUS
  Administered 2023-12-18: 2 [IU] via SUBCUTANEOUS
  Administered 2023-12-20: 3 [IU] via SUBCUTANEOUS
  Administered 2023-12-20 (×2): 2 [IU] via SUBCUTANEOUS
  Administered 2023-12-21: 3 [IU] via SUBCUTANEOUS
  Administered 2023-12-21: 2 [IU] via SUBCUTANEOUS
  Administered 2023-12-22: 3 [IU] via SUBCUTANEOUS

## 2023-12-16 MED ORDER — INSULIN DEGLUDEC 200 UNIT/ML ~~LOC~~ SOPN: 90.0000 [IU] | PEN_INJECTOR | Freq: Every day | SUBCUTANEOUS | Status: DC

## 2023-12-16 MED ORDER — KETOROLAC TROMETHAMINE 15 MG/ML IJ SOLN
INTRAMUSCULAR | Status: AC
  Filled 2023-12-16: qty 1

## 2023-12-16 MED ORDER — DOCUSATE SODIUM 100 MG PO CAPS
100.0000 mg | ORAL_CAPSULE | Freq: Two times a day (BID) | ORAL | Status: DC
Start: 2023-12-16 — End: 2023-12-22
  Administered 2023-12-16 – 2023-12-22 (×12): 100 mg via ORAL
  Filled 2023-12-16 (×12): qty 1

## 2023-12-16 MED ORDER — SODIUM CHLORIDE 0.9 % IV SOLN
1000.0000 mg | INTRAVENOUS | Status: AC
  Administered 2023-12-16: 1000 mg via INTRAVENOUS
  Filled 2023-12-16: qty 20

## 2023-12-16 MED ORDER — FENTANYL CITRATE PF 50 MCG/ML IJ SOSY
50.0000 ug | PREFILLED_SYRINGE | Freq: Once | INTRAMUSCULAR | Status: AC
  Administered 2023-12-16: 100 ug via INTRAVENOUS
  Filled 2023-12-16: qty 2

## 2023-12-16 MED ORDER — METHOCARBAMOL 500 MG PO TABS
500.0000 mg | ORAL_TABLET | Freq: Four times a day (QID) | ORAL | Status: DC | PRN
  Administered 2023-12-17 – 2023-12-22 (×16): 500 mg via ORAL
  Filled 2023-12-16 (×16): qty 1

## 2023-12-16 MED ORDER — EPHEDRINE 5 MG/ML INJ
INTRAVENOUS | Status: AC
  Filled 2023-12-16: qty 5

## 2023-12-16 MED ORDER — SODIUM CHLORIDE 0.9 % IV SOLN: INTRAVENOUS | Status: DC

## 2023-12-16 MED ORDER — HYDROMORPHONE HCL 1 MG/ML IJ SOLN
0.2500 mg | INTRAMUSCULAR | Status: DC | PRN
  Administered 2023-12-16 (×4): 0.5 mg via INTRAVENOUS

## 2023-12-16 MED ORDER — INSULIN ASPART 100 UNIT/ML IJ SOLN
0.0000 [IU] | Freq: Every day | INTRAMUSCULAR | Status: DC
  Administered 2023-12-16: 3 [IU] via SUBCUTANEOUS

## 2023-12-16 MED ORDER — PROPOFOL 10 MG/ML IV BOLUS
INTRAVENOUS | Status: AC
Start: 2023-12-16 — End: 2023-12-16
  Filled 2023-12-16: qty 20

## 2023-12-16 MED ORDER — ORAL CARE MOUTH RINSE: 15.0000 mL | Freq: Once | OROMUCOSAL | Status: AC

## 2023-12-16 MED ORDER — ZOLPIDEM TARTRATE 5 MG PO TABS
10.0000 mg | ORAL_TABLET | Freq: Every evening | ORAL | Status: DC | PRN
Start: 2023-12-16 — End: 2023-12-22
  Administered 2023-12-17 – 2023-12-18 (×2): 10 mg via ORAL
  Filled 2023-12-16 (×2): qty 2

## 2023-12-16 MED ORDER — DEXAMETHASONE SODIUM PHOSPHATE 10 MG/ML IJ SOLN
INTRAMUSCULAR | Status: DC | PRN
  Administered 2023-12-16: 10 mg via PERINEURAL

## 2023-12-16 MED ORDER — ONDANSETRON HCL 4 MG/2ML IJ SOLN: 4.0000 mg | Freq: Four times a day (QID) | INTRAMUSCULAR | Status: DC | PRN

## 2023-12-16 MED ORDER — HYDROCHLOROTHIAZIDE 12.5 MG PO TABS
12.5000 mg | ORAL_TABLET | Freq: Every day | ORAL | Status: DC
  Administered 2023-12-17 – 2023-12-22 (×5): 12.5 mg via ORAL
  Filled 2023-12-16 (×6): qty 1

## 2023-12-16 MED ORDER — EPHEDRINE SULFATE-NACL 50-0.9 MG/10ML-% IV SOSY
PREFILLED_SYRINGE | INTRAVENOUS | Status: DC | PRN
  Administered 2023-12-16: 5 mg via INTRAVENOUS
  Administered 2023-12-16 (×2): 10 mg via INTRAVENOUS

## 2023-12-16 MED ORDER — ROSUVASTATIN CALCIUM 20 MG PO TABS
20.0000 mg | ORAL_TABLET | Freq: Every day | ORAL | Status: DC
  Administered 2023-12-17 – 2023-12-22 (×6): 20 mg via ORAL
  Filled 2023-12-16 (×6): qty 1

## 2023-12-16 MED ORDER — SODIUM CHLORIDE 0.9 % IR SOLN
Status: DC | PRN
  Administered 2023-12-16: 4000 mL

## 2023-12-16 MED ORDER — CHLORHEXIDINE GLUCONATE 0.12 % MT SOLN
15.0000 mL | Freq: Once | OROMUCOSAL | Status: AC
  Administered 2023-12-16: 15 mL via OROMUCOSAL

## 2023-12-16 MED ORDER — OXYCODONE HCL 5 MG/5ML PO SOLN: 5.0000 mg | Freq: Once | ORAL | Status: DC | PRN

## 2023-12-16 MED ORDER — ONDANSETRON HCL 4 MG PO TABS: 4.0000 mg | ORAL_TABLET | Freq: Four times a day (QID) | ORAL | Status: DC | PRN

## 2023-12-16 MED ORDER — ONDANSETRON HCL 4 MG/2ML IJ SOLN: 4.0000 mg | Freq: Once | INTRAMUSCULAR | Status: DC | PRN

## 2023-12-16 MED ORDER — TOBRAMYCIN SULFATE 1.2 G IJ SOLR
INTRAMUSCULAR | Status: AC
  Filled 2023-12-16: qty 3.6

## 2023-12-16 MED ORDER — ONDANSETRON HCL 4 MG/2ML IJ SOLN
INTRAMUSCULAR | Status: AC
  Filled 2023-12-16: qty 2

## 2023-12-16 MED ORDER — INSULIN ASPART 100 UNIT/ML IJ SOLN: 0.0000 [IU] | INTRAMUSCULAR | Status: DC | PRN

## 2023-12-16 MED ORDER — HYDROMORPHONE HCL 1 MG/ML IJ SOLN
INTRAMUSCULAR | Status: AC
  Filled 2023-12-16: qty 2

## 2023-12-16 MED ORDER — KETOROLAC TROMETHAMINE 15 MG/ML IJ SOLN
7.5000 mg | Freq: Once | INTRAMUSCULAR | Status: AC
  Administered 2023-12-16: 7.5 mg via INTRAVENOUS

## 2023-12-16 MED ORDER — MIDAZOLAM HCL 2 MG/2ML IJ SOLN
1.0000 mg | Freq: Once | INTRAMUSCULAR | Status: AC
  Administered 2023-12-16: 2 mg via INTRAVENOUS
  Filled 2023-12-16: qty 2

## 2023-12-16 MED ORDER — OXYCODONE HCL 5 MG PO TABS: 5.0000 mg | ORAL_TABLET | Freq: Once | ORAL | Status: DC | PRN

## 2023-12-16 MED ORDER — DIPHENHYDRAMINE HCL 12.5 MG/5ML PO ELIX: 12.5000 mg | ORAL_SOLUTION | ORAL | Status: DC | PRN

## 2023-12-16 MED ORDER — HYDROMORPHONE HCL 1 MG/ML IJ SOLN
0.5000 mg | INTRAMUSCULAR | Status: DC | PRN
  Administered 2023-12-16 (×4): 0.5 mg via INTRAVENOUS

## 2023-12-16 MED ORDER — LOSARTAN POTASSIUM 50 MG PO TABS
50.0000 mg | ORAL_TABLET | Freq: Every day | ORAL | Status: DC
  Administered 2023-12-17 – 2023-12-22 (×5): 50 mg via ORAL
  Filled 2023-12-16 (×5): qty 1

## 2023-12-16 MED ORDER — PHENYLEPHRINE 80 MCG/ML (10ML) SYRINGE FOR IV PUSH (FOR BLOOD PRESSURE SUPPORT)
PREFILLED_SYRINGE | INTRAVENOUS | Status: DC | PRN
  Administered 2023-12-16 (×3): 160 ug via INTRAVENOUS
  Administered 2023-12-16: 80 ug via INTRAVENOUS
  Administered 2023-12-16: 160 ug via INTRAVENOUS

## 2023-12-16 MED ORDER — AMISULPRIDE (ANTIEMETIC) 5 MG/2ML IV SOLN: 10.0000 mg | Freq: Once | INTRAVENOUS | Status: DC | PRN

## 2023-12-16 MED ORDER — PROPOFOL 10 MG/ML IV BOLUS
INTRAVENOUS | Status: DC | PRN
  Administered 2023-12-16 (×3): 20 mg via INTRAVENOUS

## 2023-12-16 MED ORDER — PROPOFOL 500 MG/50ML IV EMUL
INTRAVENOUS | Status: AC
  Filled 2023-12-16: qty 50

## 2023-12-16 MED ORDER — ESCITALOPRAM OXALATE 20 MG PO TABS
20.0000 mg | ORAL_TABLET | Freq: Every day | ORAL | Status: DC
  Administered 2023-12-17 – 2023-12-22 (×6): 20 mg via ORAL
  Filled 2023-12-16 (×6): qty 1

## 2023-12-16 MED ORDER — CEFAZOLIN SODIUM-DEXTROSE 2-4 GM/100ML-% IV SOLN
2.0000 g | INTRAVENOUS | Status: AC
  Administered 2023-12-16: 2 g via INTRAVENOUS
  Filled 2023-12-16: qty 100

## 2023-12-16 MED ORDER — PHENYLEPHRINE HCL-NACL 20-0.9 MG/250ML-% IV SOLN
INTRAVENOUS | Status: AC
  Filled 2023-12-16: qty 250

## 2023-12-16 MED ORDER — VANCOMYCIN HCL 1750 MG/350ML IV SOLN
1750.0000 mg | INTRAVENOUS | Status: DC
  Filled 2023-12-16: qty 350

## 2023-12-16 SURGICAL SUPPLY — 53 items
BAG COUNTER SPONGE SURGICOUNT (BAG) IMPLANT
BAG ZIPLOCK 12X15 (MISCELLANEOUS) ×1 IMPLANT
BENZOIN TINCTURE PRP APPL 2/3 (GAUZE/BANDAGES/DRESSINGS) ×1 IMPLANT
BNDG ELASTIC 4INX 5YD STR LF (GAUZE/BANDAGES/DRESSINGS) ×1 IMPLANT
BNDG ELASTIC 6INX 5YD STR LF (GAUZE/BANDAGES/DRESSINGS) ×1 IMPLANT
BOWL SMART MIX CTS (DISPOSABLE) IMPLANT
BRUSH FEMORAL CANAL (MISCELLANEOUS) ×1 IMPLANT
CEMENT BONE GENTAMICIN 40 (Cement) IMPLANT
CNTNR URN SCR LID CUP LEK RST (MISCELLANEOUS) IMPLANT
COOLER ICEMAN CLASSIC (MISCELLANEOUS) ×1 IMPLANT
COVER SURGICAL LIGHT HANDLE (MISCELLANEOUS) ×1 IMPLANT
CUFF TRNQT CYL 34X4.125X (TOURNIQUET CUFF) ×1 IMPLANT
DRAPE INCISE IOBAN 66X45 STRL (DRAPES) ×3 IMPLANT
DRAPE U-SHAPE 47X51 STRL (DRAPES) ×1 IMPLANT
DRESSING AQUACEL AG SP 3.5X10 (GAUZE/BANDAGES/DRESSINGS) ×1 IMPLANT
DRSG ADAPTIC 3X8 NADH LF (GAUZE/BANDAGES/DRESSINGS) ×1 IMPLANT
DURAPREP 26ML APPLICATOR (WOUND CARE) ×1 IMPLANT
ELECT REM PT RETURN 15FT ADLT (MISCELLANEOUS) ×1 IMPLANT
GAUZE PAD ABD 8X10 STRL (GAUZE/BANDAGES/DRESSINGS) ×2 IMPLANT
GAUZE SPONGE 4X4 12PLY STRL (GAUZE/BANDAGES/DRESSINGS) ×2 IMPLANT
GAUZE XEROFORM 1X8 LF (GAUZE/BANDAGES/DRESSINGS) IMPLANT
GAUZE XEROFORM 5X9 LF (GAUZE/BANDAGES/DRESSINGS) IMPLANT
GLOVE BIO SURGEON STRL SZ7.5 (GLOVE) ×1 IMPLANT
GLOVE BIOGEL PI IND STRL 8 (GLOVE) ×2 IMPLANT
GLOVE ECLIPSE 8.0 STRL XLNG CF (GLOVE) ×1 IMPLANT
GOWN STRL REUS W/ TWL XL LVL3 (GOWN DISPOSABLE) ×2 IMPLANT
IMMOBILIZER KNEE 20 THIGH 36 (SOFTGOODS) IMPLANT
KIT TURNOVER KIT A (KITS) ×1 IMPLANT
LAVAGE JET IRRISEPT WOUND (IRRIGATION / IRRIGATOR) IMPLANT
MANIFOLD NEPTUNE II (INSTRUMENTS) ×1 IMPLANT
NS IRRIG 1000ML POUR BTL (IV SOLUTION) ×1 IMPLANT
PACK TOTAL KNEE CUSTOM (KITS) ×1 IMPLANT
PADDING CAST COTTON 6X4 STRL (CAST SUPPLIES) ×1 IMPLANT
PROTECTOR NERVE ULNAR (MISCELLANEOUS) ×1 IMPLANT
SET HNDPC FAN SPRY TIP SCT (DISPOSABLE) ×1 IMPLANT
SET PAD KNEE POSITIONER (MISCELLANEOUS) ×1 IMPLANT
SOLUTION PRONTOSAN WOUND 350ML (IRRIGATION / IRRIGATOR) IMPLANT
SPIKE FLUID TRANSFER (MISCELLANEOUS) ×1 IMPLANT
STAPLER SKIN PROX 35W (STAPLE) IMPLANT
STRIP CLOSURE SKIN 1/2X4 (GAUZE/BANDAGES/DRESSINGS) ×2 IMPLANT
SUT ETHILON 2 0 PS N (SUTURE) IMPLANT
SUT MNCRL AB 3-0 PS2 18 (SUTURE) IMPLANT
SUT MNCRL AB 4-0 PS2 18 (SUTURE) ×1 IMPLANT
SUT STRATAFIX PDS+ 0 24IN (SUTURE) ×1 IMPLANT
SUT VIC AB 0 CT1 36 (SUTURE) ×1 IMPLANT
SUT VIC AB 1 CT1 36 (SUTURE) ×3 IMPLANT
SUT VIC AB 2-0 CT1 TAPERPNT 27 (SUTURE) ×3 IMPLANT
SWAB COLLECTION DEVICE MRSA (MISCELLANEOUS) ×1 IMPLANT
SWAB CULTURE ESWAB REG 1ML (MISCELLANEOUS) ×1 IMPLANT
TOWER CARTRIDGE SMART MIX (DISPOSABLE) IMPLANT
TRAY FOLEY MTR SLVR 16FR STAT (SET/KITS/TRAYS/PACK) ×1 IMPLANT
WATER STERILE IRR 1000ML POUR (IV SOLUTION) ×2 IMPLANT
YANKAUER SUCT BULB TIP NO VENT (SUCTIONS) ×1 IMPLANT

## 2023-12-16 NOTE — Op Note (Signed)
 Operative Note  Date of operation: 12/16/2023 Preoperative diagnosis: Chronic infection of left knee revision arthroplasty Postoperative diagnosis: Same  Procedure: Left knee excision arthroplasty with removal of all components and placement of an antibiotic spacer; arthrotomy with synovectomy all compartments left knee  Surgeon: Lonni GRADE. Vernetta, MD Assistant: Tory Gaskins, PA-C  Anesthesia: #1 left lower extremity adductor canal block, #2 spinal Antibiotics: IV vancomycin  and IV Ancef  after cultures obtained EBL: 200 cc Complications: None  Indications: The patient is a 68 year old gentleman who originally had a primary total knee arthroplasty back in December 2020 secondary to an arthritic knee.  After that surgery he unfortunately developed prostate cancer and had surgeries related to prostate cancer and was on immunosuppressive medications.  He also became a poorly controlled diabetic.  He eventually developed an infection of his left total knee arthroplasty and had all of those components removed in February 2023 with placement of a temporary antibiotic spacer.  In late May 2023 he underwent removal of the antibiotic spacer and placement of a revision arthroplasty.  That did well for over a year and a half and then he developed drainage from his knee again and has had several incision and drainages with poly exchanges of that knee and continues to present with a chronic infection of the knee.  At this point we have recommended removal of all components and if possible placement of another antibiotic spacer.  He understands that her options are coming significantly limited and that the only other definitive treatment for his knee could be a knee fusion versus an above-knee amputation.  Regardless, the only way to clear this infection is to get all the components of the knee.  Procedure description: After informed consent was obtained and the appropriate left knee was marked anesthesia  obtained an adductor canal block in the holding room and the patient was then brought to the operating room and set up on the operative table where spinal anesthesia was obtained.  He was then laid in spine position on the operating table and a nonsterile tractors placed around his upper left thigh.  His left thigh, knee, leg, ankle and foot were prepped and draped DuraPrep and sterile drapes including a sterile stockinette.  A timeout was called and identified as correct patient's correct left knee.  We then made an extensile incision over the anterior aspect of his knee carried this proximally distally and dissecting down to the knee joint through abundant scar tissue from his multiple surgeries.  We were able to open up the knee joint and found again fluid that was consistent with a chronic infection.  We did open the knee widely and performed a synovectomy of all compartments of the knee.  Next we were able to remove the tibial component and the femoral component after much effort without significantly destroying the bone but still there is certainly not the best quality bone for considering revision arthroplasty and less we are able to better clear the infection.  We then removed all cement debris that we could from the knee and irrigated the knee with 3 L normal saline solution using pulsatile lavage.  We then irrigated the knee with 2 separate antibiotic solutions and cleansing solutions letting some of that fluid stay within the knee.  We then irrigated that solution and mixed cement with vancomycin  and gentamicin.  We then placed a large ball and a lot of cement within the knee joint itself to preserve a space in an effort to try to eradicate  the infection.  We then placed some more irrigation solution in the knee that was antibiotic laden.  We then let the cement hardened and the tourniquet was let down and hemostasis was obtained electrocautery.  The arthrotomy is closed with interrupted #1 Vicryl suture  followed by 0 Vicryl goes deep tissue and 2-0 Vicryl because subcutaneous tissue.  2-0 nylon suture was used to close the skin.  Well-padded sterile dressing was applied and the patient's knee was placed in a knee immobilizer.  He was taken to the recovery room in stable condition.  Tory Gaskins, PA-C did assist in the interrogation beginning and his assistance was very crucial for helping get these implants out and helping manage soft tissue and a layered closure of the wound.  Postoperatively he will remain in some type of knee brace locked to not allow any motion and he will be nonweightbearing to the barely touchdown weightbearing on that left leg for the foreseeable future.

## 2023-12-16 NOTE — Anesthesia Procedure Notes (Signed)
 Procedure Name: MAC Date/Time: 12/16/2023 11:27 AM  Performed by: Dasie Nena PARAS, CRNAPre-anesthesia Checklist: Patient identified, Emergency Drugs available, Suction available, Patient being monitored and Timeout performed Oxygen Delivery Method: Simple face mask Placement Confirmation: positive ETCO2

## 2023-12-16 NOTE — Progress Notes (Addendum)
 Pharmacy Antibiotic Note  Quan Cybulski is a 68 y.o. male admitted on 12/16/2023 with chronic L knee infection s/p revision arthoplasty on 9/26.  Pharmacy has been consulted for vancomycin  dosing. Vancomycin  IV 1000mg  x1 given peri-operatively.   Afebrile, no leukocytosis, Scr 1.17 (BL ~1)  Plan: Vancomycin  1750mg  IV q24h (eAUC 470, Scr 1.17, Vd 0.72) Monitor renal function, clinical status  Follow up cultures, length of therapy   Height: 5' 10 (177.8 cm) Weight: 90.7 kg (200 lb) IBW/kg (Calculated) : 73  Temp (24hrs), Avg:98.1 F (36.7 C), Min:97.4 F (36.3 C), Max:99.5 F (37.5 C)  Recent Labs  Lab 12/16/23 0925  WBC 4.7  CREATININE 1.17    Estimated Creatinine Clearance: 69.4 mL/min (by C-G formula based on SCr of 1.17 mg/dL).    Allergies  Allergen Reactions   Rifampin  Nausea Only and Other (See Comments)    Skin crawling- bodyache    Antimicrobials this admission: Ancef  x 1 9/26 Vancomycin  9/26 >>   Dose adjustments this admission: None  Microbiology results: 9/26 soft tissue cx: sent  9/26  MRSA PCR: neg  Thank you for allowing pharmacy to be a part of this patient's care.  Gurpreet Mariani 12/16/2023 6:06 PM

## 2023-12-16 NOTE — Anesthesia Procedure Notes (Signed)
 Anesthesia Regional Block: Adductor canal block   Pre-Anesthetic Checklist: , timeout performed,  Correct Patient, Correct Site, Correct Laterality,  Correct Procedure, Correct Position, site marked,  Risks and benefits discussed,  Surgical consent,  Pre-op evaluation,  At surgeon's request and post-op pain management  Laterality: Left  Prep: Maximum Sterile Barrier Precautions used, chloraprep       Needles:  Injection technique: Single-shot  Needle Type: Echogenic Stimulator Needle     Needle Length: 9cm  Needle Gauge: 22     Additional Needles:   Procedures:,,,, ultrasound used (permanent image in chart),,    Narrative:  Start time: 12/16/2023 10:25 AM End time: 12/16/2023 10:30 AM Injection made incrementally with aspirations every 5 mL.  Performed by: Personally  Anesthesiologist: Merla Almarie HERO, DO  Additional Notes: Monitors applied. No increased pain on injection. No increased resistance to injection. Injection made in 5cc increments. Good needle visualization. Patient tolerated procedure well.

## 2023-12-16 NOTE — Transfer of Care (Signed)
 Immediate Anesthesia Transfer of Care Note  Patient: [name]  Procedure(s) Performed: IRRIGATION AND DEBRIDEMENT KNEE WITH POLY EXCHANGE (Left: Knee) REMOVAL, TOTAL ARTHROPLASTY HARDWARE, KNEE, WITH ANTIBIOTIC SPACER INSERTION (Left: Knee)  Patient Location: PACU  Anesthesia Type:Spinal and MAC combined with regional for post-op pain  Level of Consciousness: awake, alert , oriented, and patient cooperative  Airway & Oxygen Therapy: Patient Spontanous Breathing and Patient connected to face mask oxygen  Post-op Assessment: Report given to RN and Post -op Vital signs reviewed and stable  Post vital signs: Reviewed and stable  Last Vitals:  Vitals Value Taken Time  BP 101/59 12/16/23 14:39  Temp    Pulse 82 12/16/23 14:41  Resp 14 12/16/23 14:41  SpO2 96 % 12/16/23 14:41  Vitals shown include unfiled device data.  Last Pain:  Vitals:   12/16/23 1040  TempSrc:   PainSc: 0-No pain         Complications: No notable events documented.

## 2023-12-16 NOTE — Interval H&P Note (Signed)
 History and Physical Interval Note: Patient understands he is here today due to the chronic infection involving his left total knee revision arthroplasty.  He understands today we will open the knee to perform a synovectomy with washing out the knee as best we can and potentially removing all components and placing a temporary antibiotic spacer but a lot of this will depend on intraoperative findings and the quality of his bone.  There is been no acute or interval changes in medical status other than this knee infection.  The risks and benefits of surgery have been discussed in detail and informed consent has been obtained.  The left operative knee has been marked.  12/16/2023 10:10 AM  [name]  has presented today for surgery, with the diagnosis of chronic recurrent infection left knee.  The various methods of treatment have been discussed with the patient and family. After consideration of risks, benefits and other options for treatment, the patient has consented to  Procedure(s) with comments: IRRIGATION AND DEBRIDEMENT KNEE WITH POLY EXCHANGE (Left) - INCISION AND DRAINAGE LEFT KNEE, POSSIBLE POLY EXCHANGE, POSSIBLE EXCISION OF COMPONENTS AND  PLACEMENT ANTIBIOTIC SPACER REMOVAL, TOTAL ARTHROPLASTY HARDWARE, KNEE, WITH ANTIBIOTIC SPACER INSERTION (Left) as a surgical intervention.  The patient's history has been reviewed, patient examined, no change in status, stable for surgery.  I have reviewed the patient's chart and labs.  Questions were answered to the patient's satisfaction.     [name]

## 2023-12-16 NOTE — Anesthesia Procedure Notes (Signed)
 Spinal  Patient location during procedure: OR Start time: 12/16/2023 11:29 AM End time: 12/16/2023 11:40 AM Reason for block: surgical anesthesia Staffing Performed: resident/CRNA and anesthesiologist  Anesthesiologist: Merla Almarie HERO, DO Resident/CRNA: Dasie Nena PARAS, CRNA Performed by: Dasie Nena PARAS, CRNA Authorized by: Merla Almarie HERO, DO   Preanesthetic Checklist Completed: patient identified, IV checked, site marked, risks and benefits discussed, surgical consent, monitors and equipment checked, pre-op evaluation and timeout performed Spinal Block Patient position: sitting Prep: DuraPrep and site prepped and draped Patient monitoring: heart rate, continuous pulse ox, blood pressure and cardiac monitor Approach: midline Location: L3-4 Injection technique: single-shot Needle Needle type: Pencan  Needle gauge: 24 G Needle length: 10 cm Assessment Events: CSF return and second provider Additional Notes Expiration date on kit noted and within range.  Lot #[id number]   Sterile prep and drape.    Local with Lidocaine  1%.  First attempt by CRNA.   Second attempt MDA.  Good CSF flow noted with no heme or c/o paresthesia.   Patient tolerated satisfactorily.

## 2023-12-16 NOTE — Anesthesia Postprocedure Evaluation (Signed)
 Anesthesia Post Note  Patient: [name]  Procedure(s) Performed: IRRIGATION AND DEBRIDEMENT KNEE WITH POLY EXCHANGE (Left: Knee) REMOVAL, TOTAL ARTHROPLASTY HARDWARE, KNEE, WITH ANTIBIOTIC SPACER INSERTION (Left: Knee)     Patient location during evaluation: PACU Anesthesia Type: Regional, MAC and Spinal Level of consciousness: awake and alert, oriented and patient cooperative Pain management: pain level controlled Vital Signs Assessment: post-procedure vital signs reviewed and stable Respiratory status: spontaneous breathing, nonlabored ventilation and respiratory function stable Cardiovascular status: blood pressure returned to baseline and stable Postop Assessment: no apparent nausea or vomiting Anesthetic complications: no   No notable events documented.  Last Vitals:  Vitals:   12/16/23 1515 12/16/23 1535  BP: (!) 99/58   Pulse: 74 70  Resp: 14 12  Temp:    SpO2: 96% 94%    Last Pain:  Vitals:   12/16/23 1612  TempSrc:   PainSc: [address] [place]

## 2023-12-16 NOTE — Anesthesia Preprocedure Evaluation (Addendum)
 Anesthesia Evaluation  Patient identified by MRN, date of birth, ID band Patient awake    Reviewed: Allergy & Precautions, NPO status , Patient's Chart, lab work & pertinent test results  Airway Mallampati: IV  TM Distance: >3 FB Neck ROM: Full    Dental  (+) Teeth Intact, Dental Advisory Given   Pulmonary  Snores at night, no sleep study    Pulmonary exam normal breath sounds clear to auscultation       Cardiovascular hypertension (130/61 preop), Pt. on medications Normal cardiovascular exam Rhythm:Regular Rate:Normal     Neuro/Psych negative neurological ROS  negative psych ROS   GI/Hepatic Neg liver ROS,GERD  Controlled,,  Endo/Other  diabetes, Well Controlled, Type 2, Insulin  Dependent  FS 135 in preop A1c 8.1 Has Libre dexcom on  Renal/GU negative Renal ROS  negative genitourinary   Musculoskeletal  (+) Arthritis , Osteoarthritis,    Abdominal   Peds  Hematology  (+) Blood dyscrasia, anemia Hb 10.9, plt 166   Anesthesia Other Findings   Reproductive/Obstetrics negative OB ROS                              Anesthesia Physical Anesthesia Plan  ASA: 3  Anesthesia Plan: Spinal, MAC and Regional   Post-op Pain Management: Tylenol  PO (pre-op)* and Regional block*   Induction:   PONV Risk Score and Plan: 2 and Propofol  infusion and TIVA  Airway Management Planned: Natural Airway and Nasal Cannula  Additional Equipment: None  Intra-op Plan:   Post-operative Plan:   Informed Consent: I have reviewed the patients History and Physical, chart, labs and discussed the procedure including the risks, benefits and alternatives for the proposed anesthesia with the patient or authorized representative who has indicated his/her understanding and acceptance.       Plan Discussed with: CRNA  Anesthesia Plan Comments: (Will take pt cell phone back for dexcom monitoring throughout  case)         Anesthesia Quick Evaluation

## 2023-12-16 NOTE — Progress Notes (Signed)
 Orthopedic Tech Progress Note Patient Details:  [name] 1955/05/25 [id number] Bledsoe knee brace has been ordered from [facility]  Patient ID: [name], male   DOB: Jan 16, 1956, 68 y.o.   MRN: [id number]  [name] 12/16/2023, 6:06 PM

## 2023-12-16 NOTE — Plan of Care (Signed)

## 2023-12-17 ENCOUNTER — Other Ambulatory Visit: Payer: Self-pay

## 2023-12-17 DIAGNOSIS — T8454XS Infection and inflammatory reaction due to internal left knee prosthesis, sequela: Secondary | ICD-10-CM | POA: Diagnosis not present

## 2023-12-17 DIAGNOSIS — Z96652 Presence of left artificial knee joint: Secondary | ICD-10-CM | POA: Diagnosis not present

## 2023-12-17 DIAGNOSIS — I890 Lymphedema, not elsewhere classified: Secondary | ICD-10-CM

## 2023-12-17 DIAGNOSIS — E119 Type 2 diabetes mellitus without complications: Secondary | ICD-10-CM | POA: Diagnosis not present

## 2023-12-17 LAB — CBC
HCT: 28.5 % — ABNORMAL LOW (ref 39.0–52.0)
Hemoglobin: 8.8 g/dL — ABNORMAL LOW (ref 13.0–17.0)
MCH: 26.3 pg (ref 26.0–34.0)
MCHC: 30.9 g/dL (ref 30.0–36.0)
MCV: 85.1 fL (ref 80.0–100.0)
Platelets: 197 K/uL (ref 150–400)
RBC: 3.35 MIL/uL — ABNORMAL LOW (ref 4.22–5.81)
RDW: 14.1 % (ref 11.5–15.5)
WBC: 6.7 K/uL (ref 4.0–10.5)
nRBC: 0 % (ref 0.0–0.2)

## 2023-12-17 LAB — GLUCOSE, CAPILLARY
Glucose-Capillary: 228 mg/dL — ABNORMAL HIGH (ref 70–99)
Glucose-Capillary: 208 mg/dL — ABNORMAL HIGH (ref 70–99)
Glucose-Capillary: 98 mg/dL (ref 70–99)
Glucose-Capillary: 158 mg/dL — ABNORMAL HIGH (ref 70–99)

## 2023-12-17 LAB — BASIC METABOLIC PANEL WITH GFR
Anion gap: 11 (ref 5–15)
BUN: 24 mg/dL — ABNORMAL HIGH (ref 8–23)
CO2: 22 mmol/L (ref 22–32)
Calcium: 8.5 mg/dL — ABNORMAL LOW (ref 8.9–10.3)
Chloride: 102 mmol/L (ref 98–111)
Creatinine, Ser: 1.1 mg/dL (ref 0.61–1.24)
GFR, Estimated: >60 mL/min (ref >60)
Glucose, Bld: 236 mg/dL — ABNORMAL HIGH (ref 70–99)
Potassium: 4.8 mmol/L (ref 3.5–5.1)
Sodium: 134 mmol/L — ABNORMAL LOW (ref 135–145)

## 2023-12-17 LAB — HEMOGLOBIN A1C
Hgb A1c MFr Bld: 7 % — ABNORMAL HIGH (ref 4.8–5.6)
Mean Plasma Glucose: 154.2 mg/dL

## 2023-12-17 MED ORDER — SODIUM CHLORIDE 0.9 % IV SOLN
2.0000 g | Freq: Three times a day (TID) | INTRAVENOUS | Status: DC
  Administered 2023-12-17 – 2023-12-20 (×9): 2 g via INTRAVENOUS
  Filled 2023-12-17 (×9): qty 12.5

## 2023-12-17 MED ORDER — DAPTOMYCIN-SODIUM CHLORIDE 700-0.9 MG/100ML-% IV SOLN
700.0000 mg | Freq: Every day | INTRAVENOUS | Status: DC
  Administered 2023-12-17 – 2023-12-22 (×6): 700 mg via INTRAVENOUS
  Filled 2023-12-17 (×6): qty 100

## 2023-12-17 NOTE — Progress Notes (Signed)
 There was a order for placing PICC line. There were pending results of wound cultures. No infection diease team consulted yet. Secure chatted to Dr. Overton who was ID team regarding this matter. Dr. Overton wanted to wait until tomorrow. HS [employer]

## 2023-12-17 NOTE — Consult Note (Signed)
 [facility]    Date of Admission:  12/16/2023     Reason for Consult: recurrent left knee pji    Referring Provider: Vernetta Bruckner     Lines:  Peripheral iv's  Abx: 9/26-c vanc         Assessment: 68 yo male dm2, hx prostate cancer, left lower chronic lymphedema, gerd, htn/hlp, index left TKA 02/2019 with recurrent knee pji admitted here for same despite being on suppressive bactrim   Time course: 07/2020 initial left knee pji s/p dair; cx mssa; s/p 6 weeks cefazolin  and couldn't tolerate rifampin , followed by suppressive cefadroxil  04/2021 recurrent left knee pji despite on abx; s/p removal of hardware and abx spacer; cx mrsa; vanc--> dapto course of 05/21/21-07/02/21; underwent new knee placement 12/16/21 recurrent swelling/pain aspiration cx negative (although was getting doxy empirically); and ultimately underwent on 03/25/2023 I&D with polyliner exchange; cx mrse. Initial 3 weeks rifampin  but couldn't tolerate so stopped; transitioned to bactrim  04/28/23 and continued on it 11/04/23 Despite being on bactrim  continue to have swelling/pain and was even seen at [facility] for second opinion and underwent I&D on this date, sent out on high dose bactrim . No cultures were done  12/16/23 readmitted for ongoing sx with decision to remove all hardware and placed abx spacer. Possible aka if unable to rid of infection   I am unclear if there is a new infection or previous infection failing treatment, certainly possible with mssa/mrsa and ConS  He has been taking bactrim  prior to surgery   I reviewed the literature with him, he is high risk obviously for whatever reason (dm2 and lymphedema), and despite 2 stage planned repair, my plan to treat for 3 months and then observe off abx for another 3-6 months to make sure no relapse prior to considering placement new joint    Plan: Change abx to dapto/cefepime Continue standard isolation  precaution F/u operative cx Picc tomorrow Anticipate dc early next week hopefully with cultures available by then      ------------------------------------------------ Principal Problem:   Infection of total left knee replacement    HPI: [name] is a 68 y.o. male  dm2, hx prostate cancer, left lower chronic lymphedema, gerd, htn/hlp, index left TKA 02/2019 with recurrent knee pji admitted here for same despite being on suppressive bactrim   Time course: 07/2020 initial left knee pji s/p dair; cx mssa; s/p 6 weeks cefazolin  and couldn't tolerate rifampin , followed by suppressive cefadroxil  04/2021 recurrent left knee pji despite on abx; s/p removal of hardware and abx spacer; cx mrsa; vanc--> dapto course of 05/21/21-07/02/21; underwent new knee placement 12/16/21 recurrent swelling/pain aspiration cx negative (although was getting doxy empirically); and ultimately underwent on 03/25/2023 I&D with polyliner exchange; cx mrse. Initial 3 weeks rifampin  but couldn't tolerate so stopped; transitioned to bactrim  04/28/23 and continued on it 11/04/23 Despite being on bactrim  continue to have swelling/pain and was even seen at [facility] for second opinion and underwent I&D on this date, sent out on high dose bactrim . No cultures were done  12/16/23 readmitted for ongoing sx with decision to remove all hardware and placed abx spacer. Possible aka if unable to rid of infection     Before 11/04/23 he was deescalated to bactrim  ds 1 tab bid given improvement in sx and inflammatory, but after 11/04/23 he was upped to 2 ds bid. He has lost followup with our id clinic since 05/2023    I  reviewed operative note 9/26 We were able to open up the knee joint and found again fluid that was consistent with a chronic infection. We did open the knee widely and performed a synovectomy of all compartments of the knee. Next we were able to remove the tibial component and the femoral component after  much effort without significantly destroying the bone but still there is certainly not the best quality bone for considering revision arthroplasty and less we are able to better clear the infection. We then removed all cement debris that we could from the knee and irrigated the knee with 3 L normal saline solution using pulsatile lavage. We then irrigated the knee with 2 separate antibiotic solutions and cleansing solutions letting some of that fluid stay within the knee. We then irrigated that solution and mixed cement with vancomycin  and gentamicin. We then placed a large ball and a lot of cement within the knee joint itself to preserve a space in an effort to try to eradicate the infection. We then placed some more irrigation solution in the knee that was antibiotic laden. We then let the cement hardened and the tourniquet was let down and hemostasis was obtained electrocautery      Patient was in a jovial mood actually when I speak with him today  He wants his infection gone   Family History  Problem Relation Age of Onset   Cancer Mother        small cell lung    Social History   Tobacco Use   Smoking status: Never   Smokeless tobacco: Never  Vaping Use   Vaping status: Never Used  Substance Use Topics   Alcohol use: No   Drug use: No    Allergies  Allergen Reactions   Rifampin  Nausea Only and Other (See Comments)    Skin crawling- bodyache    Review of Systems: ROS All Other ROS was negative, except mentioned above   Past Medical History:  Diagnosis Date   Arthritis    Diabetes mellitus without complication (HCC) 2019   Diabetes Type II   GERD (gastroesophageal reflux disease)    diet controlled   HTN (hypertension) 10/27/2018   Hyperlipidemia    Internal hemorrhoids 2014   noted on colonoscopy   MRSA infection 05/17/2023   MSSA (methicillin susceptible Staphylococcus aureus) infection 09/03/2020   Osteoarthritis of both knees    Prostate cancer (HCC)     prostate   Sigmoid diverticulosis 2014   Mild, noted on colonoscopy   Skin lesion 01/01/2021   Staphylococcus epidermidis infection 05/17/2023   Type 2 diabetes mellitus with other specified complication (HCC) 01/01/2021   Vaccine counseling 05/04/2021       Scheduled Meds:  docusate sodium   100 mg Oral BID   escitalopram   20 mg Oral Daily   gabapentin   100 mg Oral TID   losartan   50 mg Oral Daily   And   hydrochlorothiazide   12.5 mg Oral Daily   insulin  aspart  0-15 Units Subcutaneous TID WC   insulin  aspart  0-5 Units Subcutaneous QHS   insulin  glargine  90 Units Subcutaneous Daily   pantoprazole   40 mg Oral Daily   rosuvastatin   20 mg Oral Daily   Continuous Infusions:  sodium chloride  75 mL/hr at 12/16/23 1558   vancomycin      PRN Meds:.acetaminophen , diphenhydrAMINE , HYDROmorphone  (DILAUDID ) injection, HYDROmorphone , methocarbamol  **OR** methocarbamol  (ROBAXIN ) injection, metoCLOPramide  **OR** metoCLOPramide  (REGLAN ) injection, ondansetron  **OR** ondansetron  (ZOFRAN ) IV, oxyCODONE , oxyCODONE , zolpidem    OBJECTIVE: Blood  pressure 130/70, pulse 74, temperature 98.1 F (36.7 C), temperature source Oral, resp. rate 19, height 5' 10 (1.778 m), weight 90.7 kg, SpO2 98%.  Physical Exam  General/constitutional: no distress, pleasant HEENT: Normocephalic, PER, Conj Clear, EOMI, Oropharynx clear Neck supple CV: rrr no mrg Lungs: clear to auscultation, normal respiratory effort Abd: Soft, Nontender Ext: no edema Skin: No Rash Neuro: nonfocal MSK: left leg chronic lymphedema; left knee dressing c/d/i   Lab Results Lab Results  Component Value Date   WBC 6.7 12/17/2023   HGB 8.8 (L) 12/17/2023   HCT 28.5 (L) 12/17/2023   MCV 85.1 12/17/2023   PLT 197 12/17/2023    Lab Results  Component Value Date   CREATININE 1.10 12/17/2023   BUN 24 (H) 12/17/2023   NA 134 (L) 12/17/2023   K 4.8 12/17/2023   CL 102 12/17/2023   CO2 22 12/17/2023    Lab Results   Component Value Date   ALT 33 04/14/2023   AST 31 04/14/2023   ALKPHOS 73 05/26/2021   BILITOT 0.4 04/14/2023      Microbiology: Recent Results (from the past 240 hours)  Surgical pcr screen     Status: None   Collection Time: 12/16/23  8:48 AM   Specimen: Nasal Mucosa; Nasal Swab  Result Value Ref Range Status   MRSA, PCR NEGATIVE NEGATIVE Final   Staphylococcus aureus NEGATIVE NEGATIVE Final    Comment: (NOTE) The Xpert SA Assay (FDA approved for NASAL specimens in patients 33 years of age and older), is one component of a comprehensive surveillance program. It is not intended to diagnose infection nor to guide or monitor treatment. Performed at [facility], [address] [place], [place] [address]   Aerobic/Anaerobic Culture w Gram Stain (surgical/deep wound)     Status: None (Preliminary result)   Collection Time: 12/16/23 11:13 AM   Specimen: PATH Cytology Misc. fluid; Body Fluid  Result Value Ref Range Status   Specimen Description WOUND  Final   Special Requests LEFT KNEE JOINT FLUID  Final   Gram Stain   Final    RARE WBC PRESENT, PREDOMINANTLY PMN NO ORGANISMS SEEN    Culture   Final    NO GROWTH < 24 HOURS Performed at [facility], [address] [place], [place] [address]    Report Status PENDING  Incomplete  Aerobic/Anaerobic Culture w Gram Stain (surgical/deep wound)     Status: None (Preliminary result)   Collection Time: 12/16/23 11:14 AM   Specimen: PATH Soft tissue  Result Value Ref Range Status   Specimen Description   Final    TISSUE Performed at [facility], [address], [place], [place] [address]    Special Requests   Final    NONE Performed at [facility], [address], [place], [place] [address]    Gram Stain   Final    RARE WBC PRESENT, PREDOMINANTLY PMN NO ORGANISMS SEEN    Culture   Final    NO GROWTH < 24 HOURS Performed at [facility], [address], [place], [place] [address]    Report Status PENDING  Incomplete     Serology:    Imaging: If present, new imagings (plain films, ct scans, and mri) have been personally visualized and interpreted; radiology reports have been reviewed. Decision making incorporated into the Impression / Recommendations.  12/16/23 xray left knee Removal of previous left knee arthroplasty. Fusion of the left knee  and filling of the postoperative space with cement. Some medial displacement of the tibia with respect to the femur is identified. Left knee effusion and soft tissue gas are likely postoperative.  Constance ONEIDA Passer, MD [facility] [phone] pager    12/17/2023, 9:50 AM

## 2023-12-17 NOTE — Evaluation (Signed)
 Physical Therapy Evaluation Patient Details Name: [name] MRN: [id number] DOB: 1956-03-02 Today's Date: 12/17/2023  History of Present Illness  s/p Left knee excision arthroplasty with removal of all components and placement of an antibiotic spacer; arthrotomy with synovectomy all compartments left knee  on 12/16/23. PMH: DM, HTN, LLE edema, multiple L knee surgeries--LTKAs, L knee abx spacer, multiple I&Ds  Clinical Impression  Pt is s/p TKA resection and placement of abx spacer resulting in the deficits listed below (see PT Problem List).  Pt is reluctantly agreeable to mobilize d/t L knee pain. With incr time pt able to sit EOB and amb a short distance in room. Requires frequent cues to maintain NWB. Will continue PT in acute setting.   Pt will benefit from acute skilled PT to increase their independence and safety with mobility to allow discharge.          If plan is discharge home, recommend the following: A lot of help with bathing/dressing/bathroom;A lot of help with walking and/or transfers;Help with stairs or ramp for entrance;Assist for transportation;Assistance with cooking/housework   Can travel by private vehicle        Equipment Recommendations None recommended by PT  Recommendations for Other Services       Functional Status Assessment Patient has had a recent decline in their functional status and demonstrates the ability to make significant improvements in function in a reasonable and predictable amount of time.     Precautions / Restrictions Precautions Precautions: Fall;Knee;Other (comment) Required Braces or Orthoses: Other Brace Other Brace: Bledsoe brace      Mobility  Bed Mobility Overal bed mobility: Needs Assistance Bed Mobility: Supine to Sit     Supine to sit: Min assist, Used rails     General bed mobility comments: assist pt to place RLE under LLE for self assist to sitting EOB. incr time needed d/t pain, pt with good effort and able  to elevate trunk with use of rails    Transfers Overall transfer level: Needs assistance Equipment used: Rolling walker (2 wheels) Transfers: Sit to/from Stand Sit to Stand: Min assist           General transfer comment: cues for LLE position and hand position    Ambulation/Gait Ambulation/Gait assistance: Min assist Gait Distance (Feet): 8 Feet Assistive device: Rolling walker (2 wheels)   Gait velocity: decr     General Gait Details: cues for sequence, use of UEs, and NWB on LLE - pt is TDWB at times  Stairs            Wheelchair Mobility     Tilt Bed    Modified Rankin (Stroke Patients Only)       Balance Overall balance assessment: Needs assistance Sitting-balance support: No upper extremity supported, Feet supported Sitting balance-Leahy Scale: Fair Sitting balance - Comments: not challenged d/t elevated pain LLE   Standing balance support: During functional activity, Reliant on assistive device for balance, Bilateral upper extremity supported Standing balance-Leahy Scale: Poor                               Pertinent Vitals/Pain Pain Assessment Pain Assessment: 0-10 Pain Score: 10-Worst pain ever Pain Location: no pain at rest, incr to 10/10 with movement Pain Descriptors / Indicators: Guarding, Sore, Pressure Pain Intervention(s): Limited activity within patient's tolerance, Monitored during session, Premedicated before session, Repositioned, Ice applied    Home Living Family/patient expects to be discharged  to:: Private residence Living Arrangements: Spouse/significant other Available Help at Discharge: Family   Home Access: Stairs to enter Entrance Stairs-Rails: Right Entrance Stairs-Number of Steps: 3   Home Layout: One level Home Equipment: Agricultural consultant (2 wheels)      Prior Function Prior Level of Function : Independent/Modified Independent                     Extremity/Trunk Assessment   Upper Extremity  Assessment Upper Extremity Assessment: Overall WFL for tasks assessed;Defer to OT evaluation    Lower Extremity Assessment Lower Extremity Assessment: LLE deficits/detail LLE Deficits / Details: ankle WFL; bledsoe brace in place locked in full extension. pt is unable to lift LLE against gravity; LE is edematous (baseline) LLE: Unable to fully assess due to pain;Unable to fully assess due to immobilization       Communication   Communication Communication: No apparent difficulties    Cognition Arousal: Alert Behavior During Therapy: WFL for tasks assessed/performed   PT - Cognitive impairments: No apparent impairments                         Following commands: Intact       Cueing       General Comments      Exercises Total Joint Exercises Ankle Circles/Pumps: AROM, Both, 5 reps   Assessment/Plan    PT Assessment Patient needs continued PT services  PT Problem List Decreased balance;Decreased activity tolerance;Decreased mobility;Decreased knowledge of precautions       PT Treatment Interventions DME instruction;Therapeutic activities;Gait training;Stair training;Functional mobility training;Therapeutic exercise;Patient/family education    PT Goals (Current goals can be found in the Care Plan section)  Acute Rehab PT Goals PT Goal Formulation: With patient Time For Goal Achievement: 12/23/23 Potential to Achieve Goals: Good    Frequency 7X/week     Co-evaluation               AM-PAC PT 6 Clicks Mobility  Outcome Measure Help needed turning from your back to your side while in a flat bed without using bedrails?: A Little Help needed moving from lying on your back to sitting on the side of a flat bed without using bedrails?: A Little Help needed moving to and from a bed to a chair (including a wheelchair)?: A Lot Help needed standing up from a chair using your arms (e.g., wheelchair or bedside chair)?: A Lot Help needed to walk in hospital  room?: A Lot Help needed climbing 3-5 steps with a railing? : Total 6 Click Score: 13    End of Session Equipment Utilized During Treatment: Gait belt Activity Tolerance: Patient tolerated treatment well Patient left: in chair;with chair alarm set;with family/visitor present Nurse Communication: Mobility status PT Visit Diagnosis: Other abnormalities of gait and mobility (R26.89)    Time: 1034-1100 PT Time Calculation (min) (ACUTE ONLY): 26 min   Charges:   PT Evaluation $PT Eval Low Complexity: 1 Low PT Treatments $Therapeutic Activity: 8-22 mins PT General Charges $$ ACUTE PT VISIT: 1 Visit         Moyinoluwa Dawe, PT  Acute Rehab Dept [facility]) [phone]  12/17/2023   [facility] 12/17/2023, 12:44 PM

## 2023-12-17 NOTE — Progress Notes (Signed)
 OT Cancellation Note  Patient Details Name: Jaycub Noorani MRN: [id number] DOB: 14-Apr-1955   Cancelled Treatment:    Reason Eval/Treat Not Completed: Fatigue/lethargy limiting ability to participate;Pain limiting ability to participate (Will continue to follow.)  Kennth Mliss Helling 12/17/2023, 11:19 AM Mliss HERO, OTR/L Acute Rehabilitation Services Office: [phone]

## 2023-12-17 NOTE — Plan of Care (Signed)
  Problem: Skin Integrity: Goal: Risk for impaired skin integrity will decrease Outcome: Progressing   Problem: Safety: Goal: Ability to remain free from injury will improve Outcome: Progressing   Problem: Pain Managment: Goal: General experience of comfort will improve and/or be controlled Outcome: Progressing   Problem: Elimination: Goal: Will not experience complications related to urinary retention Outcome: Progressing   Problem: Coping: Goal: Level of anxiety will decrease Outcome: Progressing   Problem: Education: Goal: Knowledge of General Education information will improve Description: Including pain rating scale, medication(s)/side effects and non-pharmacologic comfort measures Outcome: Progressing

## 2023-12-17 NOTE — Progress Notes (Signed)
 Patient ID: [name], male   DOB: 24-Jan-1956, 68 y.o.   MRN: [id number] The patient is awake and alert this morning.  His vital signs are stable.  His left operative knee has intact dressing that is clean as well as a Bledsoe knee brace that is locked at 30 degrees.  His foot is perfused and he is able to flex and extend his left ankle and his calf is soft.  I did talk to him in length in detail about the extent of his surgery.  All components have now been removed and there is an antibiotic spacer holding the space in his left knee.  Cultures are not growing anything yet but obviously there is been a chronic infection in his knee and he has been on suppressive antibiotics for some time but has had several I&D's of that knee this year and it is at the point where we needed to remove everything again.  This is his second attempt at potentially salvaging the knee and he understands that he may end up with a knee fusion or even potentially an above-knee amputation.  The goal was trying to clear the infection.  His most recent hemoglobin A1c is down to 7 which is better for him.  He is on IV vancomycin  now.  I will need to order a PICC line as well as an infectious disease consult for antibiotic recommendations.  I do not foresee any further surgery on his knee for up to 3 months to allow the soft tissue to heal and the knee to hopefully become sterile.  He needs to be basically nonweightbearing on that left lower extremity but can at least touch his foot to the ground but not put weight through the knee.

## 2023-12-17 NOTE — Progress Notes (Signed)
 Physical Therapy Treatment Patient Details Name: [name] MRN: [id number] DOB: 1955/06/19 Today's Date: 12/17/2023   History of Present Illness s/p Left knee excision arthroplasty with removal of all components and placement of an antibiotic spacer; arthrotomy with synovectomy all compartments left knee  on 12/16/23. PMH: DM, HTN, LLE edema, multiple L knee surgeries--LTKAs, L knee abx spacer, multiple I&Ds    PT Comments  Pt progressing this session, able to walk incr distance this pm, 18' with RW, min assist and improved ability to adhere to NWB LLE. Pt concerned about pain control tonight, RN made aware.     If plan is discharge home, recommend the following: A lot of help with bathing/dressing/bathroom;A lot of help with walking and/or transfers;Help with stairs or ramp for entrance;Assist for transportation;Assistance with cooking/housework   Can travel by private vehicle        Equipment Recommendations  None recommended by PT    Recommendations for Other Services       Precautions / Restrictions Precautions Precautions: Fall;Knee;Other (comment) Required Braces or Orthoses: Other Brace Other Brace: Bledsoe brace Restrictions Weight Bearing Restrictions Per Provider Order: Yes LLE Weight Bearing Per Provider Order: Non weight bearing     Mobility  Bed Mobility Overal bed mobility: Needs Assistance Bed Mobility: Sit to Supine     Supine to sit: Min assist, Used rails Sit to supine: Min assist   General bed mobility comments: CGA to elevate LEs    Transfers Overall transfer level: Needs assistance Equipment used: Rolling walker (2 wheels) Transfers: Sit to/from Stand Sit to Stand: Min assist           General transfer comment: cues for LLE position and hand placement    Ambulation/Gait Ambulation/Gait assistance: Min assist Gait Distance (Feet): 18 Feet Assistive device: Rolling walker (2 wheels)   Gait velocity: decr     General Gait  Details: cues for sequence, use of UEs, and NWB on LLE, improved ability maintain NWB   Stairs             Wheelchair Mobility     Tilt Bed    Modified Rankin (Stroke Patients Only)       Balance Overall balance assessment: Needs assistance Sitting-balance support: No upper extremity supported, Feet supported Sitting balance-Leahy Scale: Fair Sitting balance - Comments: not challenged d/t elevated pain LLE   Standing balance support: During functional activity, Reliant on assistive device for balance, Bilateral upper extremity supported Standing balance-Leahy Scale: Poor                              Communication Communication Communication: No apparent difficulties  Cognition Arousal: Alert Behavior During Therapy: WFL for tasks assessed/performed   PT - Cognitive impairments: No apparent impairments                         Following commands: Intact      Cueing Cueing Techniques: Verbal cues  Exercises Total Joint Exercises Ankle Circles/Pumps: AROM, Both, 5 reps    General Comments        Pertinent Vitals/Pain Pain Assessment Pain Assessment: 0-10 Pain Score: 8  Pain Location: no pain at rest, incr to 8/10 with movement Pain Descriptors / Indicators: Guarding, Sore, Pressure Pain Intervention(s): Limited activity within patient's tolerance, Monitored during session, Premedicated before session, Repositioned    Home Living Family/patient expects to be discharged to:: Private residence Living Arrangements: Spouse/significant  other Available Help at Discharge: Family   Home Access: Stairs to enter Entrance Stairs-Rails: Right Entrance Stairs-Number of Steps: 3   Home Layout: One level Home Equipment: Agricultural consultant (2 wheels)      Prior Function            PT Goals (current goals can now be found in the care plan section) Acute Rehab PT Goals PT Goal Formulation: With patient Time For Goal Achievement:  12/23/23 Potential to Achieve Goals: Good Progress towards PT goals: Progressing toward goals    Frequency    7X/week      PT Plan      Co-evaluation              AM-PAC PT 6 Clicks Mobility   Outcome Measure  Help needed turning from your back to your side while in a flat bed without using bedrails?: A Little Help needed moving from lying on your back to sitting on the side of a flat bed without using bedrails?: A Little Help needed moving to and from a bed to a chair (including a wheelchair)?: A Little Help needed standing up from a chair using your arms (e.g., wheelchair or bedside chair)?: A Little Help needed to walk in hospital room?: A Little Help needed climbing 3-5 steps with a railing? : Total 6 Click Score: 16    End of Session Equipment Utilized During Treatment: Gait belt;Other (comment) (Bledsoe brace LLE) Activity Tolerance: Patient tolerated treatment well Patient left: in bed;with call bell/phone within reach;with bed alarm set;with family/visitor present Nurse Communication: Mobility status PT Visit Diagnosis: Other abnormalities of gait and mobility (R26.89)     Time: 8451-8385 PT Time Calculation (min) (ACUTE ONLY): 26 min  Charges:    $Therapeutic Activity: 23-37 mins PT General Charges $$ ACUTE PT VISIT: 1 Visit                     Sabino Denning, PT  Acute Rehab Dept [facility]) [phone]  12/17/2023    [facility] 12/17/2023, 4:30 PM

## 2023-12-18 LAB — CBC
HCT: 23.6 % — ABNORMAL LOW (ref 39.0–52.0)
Hemoglobin: 7.3 g/dL — ABNORMAL LOW (ref 13.0–17.0)
MCH: 26.4 pg (ref 26.0–34.0)
MCHC: 30.9 g/dL (ref 30.0–36.0)
MCV: 85.5 fL (ref 80.0–100.0)
Platelets: 176 K/uL (ref 150–400)
RBC: 2.76 MIL/uL — ABNORMAL LOW (ref 4.22–5.81)
RDW: 14.4 % (ref 11.5–15.5)
WBC: 5.9 K/uL (ref 4.0–10.5)
nRBC: 0 % (ref 0.0–0.2)

## 2023-12-18 LAB — GLUCOSE, CAPILLARY
Glucose-Capillary: 99 mg/dL (ref 70–99)
Glucose-Capillary: 133 mg/dL — ABNORMAL HIGH (ref 70–99)
Glucose-Capillary: 117 mg/dL — ABNORMAL HIGH (ref 70–99)
Glucose-Capillary: 113 mg/dL — ABNORMAL HIGH (ref 70–99)
Glucose-Capillary: 140 mg/dL — ABNORMAL HIGH (ref 70–99)

## 2023-12-18 MED ORDER — ASPIRIN 81 MG PO TBEC
81.0000 mg | DELAYED_RELEASE_TABLET | Freq: Two times a day (BID) | ORAL | Status: DC
  Administered 2023-12-18 – 2023-12-22 (×8): 81 mg via ORAL
  Filled 2023-12-18 (×8): qty 1

## 2023-12-18 NOTE — Progress Notes (Addendum)
 Secure chat with Donzell PEAK re PICC placement.  ID, Dr Overton states okay to proceed with PICC Placement. RN States pt is not feeling well and in a lot of pain, requesting for PICC to not be placed today.

## 2023-12-18 NOTE — Progress Notes (Signed)
 PT TX NOTE   12/18/23 1600  PT Visit Information  Last PT Received On 12/18/23   Pt requesting to go back to bed, limited by pain, RN notified, gave meds and PT returned to assist pt back to bed.  Min assist for transfers and bed mobility, incr time needed, mobility limited d/t elevated pain.  Reviewed pain regimen and timing of meds with pt and his wife Diane. Continue to follow    Assistance Needed +2 for progression  History of Present Illness s/p Left knee excision arthroplasty with removal of all components and placement of an antibiotic spacer; arthrotomy with synovectomy all compartments left knee  on 12/16/23. PMH: DM, HTN, LLE edema, multiple L knee surgeries--LTKAs, L knee abx spacer, multiple I&Ds  Precautions  Precautions Fall;Knee;Other (comment)  Recall of Precautions/Restrictions Intact  Required Braces or Orthoses Other Brace  Other Brace Bledsoe brace  Restrictions  LLE Weight Bearing Per Provider Order NWB  Pain Assessment  Pain Assessment 0-10  Pain Score 10  Pain Descriptors / Indicators Guarding;Sore;Pressure  Pain Intervention(s) Limited activity within patient's tolerance;Monitored during session;Premedicated before session;Repositioned;Patient requesting pain meds-RN notified (asked RN for pain meds on intial check this pm, pt was medicated prior to session)  Cognition  Arousal Alert  Behavior During Therapy WFL for tasks assessed/performed  PT - Cognitive impairments No apparent impairments  Following Commands  Following commands Intact  Cueing  Cueing Techniques Verbal cues  Communication  Communication No apparent difficulties  Bed Mobility  Overal bed mobility Needs Assistance  Bed Mobility Sit to Supine  Sit to supine Min assist;Contact guard assist  General bed mobility comments pt able to self assist LLE to RLE; contact guard/lightmin assist to guide LEs  Transfers  Overall transfer level Needs assistance  Equipment used Rolling walker (2  wheels)  Transfers Sit to/from Stand  Sit to Stand Min assist  General transfer comment cues for LLE position and hand placement, assist to slide LLE fwd prior to sit  Balance  Standing balance support During functional activity;Reliant on assistive device for balance;Bilateral upper extremity supported  Standing balance-Leahy Scale Poor  PT - End of Session  Equipment Utilized During Treatment Gait belt;Other (comment) (bledsoe brace)  Activity Tolerance Patient tolerated treatment well  Patient left with family/visitor present;in bed;with call bell/phone within reach;with bed alarm set  Nurse Communication Mobility status   PT - Assessment/Plan  PT Visit Diagnosis Other abnormalities of gait and mobility (R26.89)  PT Frequency (ACUTE ONLY) 7X/week  Follow Up Recommendations Follow physician's recommendations for discharge plan and follow up therapies  Patient can return home with the following A lot of help with bathing/dressing/bathroom;A lot of help with walking and/or transfers;Help with stairs or ramp for entrance;Assist for transportation;Assistance with cooking/housework  PT equipment None recommended by PT  AM-PAC PT 6 Clicks Mobility Outcome Measure (Version 2)  Help needed turning from your back to your side while in a flat bed without using bedrails? 3  Help needed moving from lying on your back to sitting on the side of a flat bed without using bedrails? 3  Help needed moving to and from a bed to a chair (including a wheelchair)? 3  Help needed standing up from a chair using your arms (e.g., wheelchair or bedside chair)? 3  Help needed to walk in hospital room? 3  Help needed climbing 3-5 steps with a railing?  1  6 Click Score 16  Consider Recommendation of Discharge To: Home with [facility]  Progressive Mobility  What is the highest level of mobility based on the mobility assessment? Level 4 (Ambulates with assistance) - Balance while stepping forward/back - Complete  PT Goal  Progression  Progress towards PT goals Progressing toward goals  Acute Rehab PT Goals  PT Goal Formulation With patient  Time For Goal Achievement 12/23/23  Potential to Achieve Goals Good  PT Time Calculation  PT Start Time (ACUTE ONLY) 1544  PT Stop Time (ACUTE ONLY) 1603  PT Time Calculation (min) (ACUTE ONLY) 19 min  PT General Charges  $$ ACUTE PT VISIT 1 Visit  PT Treatments  $Therapeutic Activity 8-22 mins

## 2023-12-18 NOTE — Progress Notes (Signed)
 Orthopedic Surgery Progress Note   Assessment: Patient is a 68 y.o. male with left knee periprosthetic infection status post explant and placement of antibiotic spacer   Plan: -Operative plans: complete -Diet: diabetic  -Infectious disease consult, appreciate recs -DVT ppx: aspirin  81mg  BID -Antibiotics: daptomycin  and cefepime -Weight bearing status: Weight of leg foot flat left leg -PT evaluate and treat -Pain control -Dispo: remain floor status  ___________________________________________________________________________  Subjective: No acute events overnight. Pain was better controlled last night. Had some muscle spasms for which the muscle relaxer has been helping.    Physical Exam:  General: no acute distress, appears stated age Neurologic: alert, answering questions appropriately, following commands Respiratory: unlabored breathing on room air, symmetric chest rise Psychiatric: appropriate affect, normal cadence to speech  MSK:    -Left lower extremity  Ace wrap over knee c/d/I. Bledsoe brace in place EHL/TA/GSC intact Plantarflexes and dorsiflexes toes Sensation intact to light touch in sural, saphenous, tibial, deep peroneal, and superficial peroneal nerve distributions Foot warm and well perfused   Yesterday's total administered Morphine  Milligram Equivalents: 136.5   Patient name: [name] Patient MRN: [id number] Date: 12/18/23

## 2023-12-18 NOTE — Plan of Care (Signed)
  Problem: Education: Goal: Knowledge of General Education information will improve Description: Including pain rating scale, medication(s)/side effects and non-pharmacologic comfort measures Outcome: Progressing   Problem: Health Behavior/Discharge Planning: Goal: Ability to manage health-related needs will improve Outcome: Progressing   Problem: Clinical Measurements: Goal: Ability to maintain clinical measurements within normal limits will improve Outcome: Progressing Goal: Respiratory complications will improve Outcome: Progressing Goal: Cardiovascular complication will be avoided Outcome: Progressing   Problem: Coping: Goal: Level of anxiety will decrease Outcome: Progressing   Problem: Safety: Goal: Ability to remain free from injury will improve Outcome: Progressing   Problem: Skin Integrity: Goal: Risk for impaired skin integrity will decrease Outcome: Progressing   Problem: Coping: Goal: Ability to adjust to condition or change in health will improve Outcome: Progressing   Problem: Metabolic: Goal: Ability to maintain appropriate glucose levels will improve Outcome: Progressing   Problem: Skin Integrity: Goal: Risk for impaired skin integrity will decrease Outcome: Progressing   Problem: Education: Goal: Knowledge of General Education information will improve Description: Including pain rating scale, medication(s)/side effects and non-pharmacologic comfort measures Outcome: Progressing   Problem: Clinical Measurements: Goal: Respiratory complications will improve Outcome: Progressing Goal: Cardiovascular complication will be avoided Outcome: Progressing   Problem: Coping: Goal: Level of anxiety will decrease Outcome: Progressing   Problem: Safety: Goal: Ability to remain free from injury will improve Outcome: Progressing   Problem: Skin Integrity: Goal: Risk for impaired skin integrity will decrease Outcome: Progressing

## 2023-12-18 NOTE — TOC Initial Note (Signed)
 Transition of Care [facility]) - Initial/Assessment Note    Patient Details  Name: [name] MRN: [id number] Date of Birth: 28-Apr-1955  Transition [facility] CM/SW Contact:    Sheri ONEIDA Sharps, LCSW Phone Number: 12/18/2023, 6:13 PM  Clinical Narrative:                 Pt from home w/ spouse. Pt continues medical workup. Pending eval for need of home IV abx. Pam w/ [employer] notified and following.   Expected Discharge Plan: Home w Home Health Services Barriers to Discharge: Continued Medical Work up   Patient Goals and CMS Choice Patient states their goals for this hospitalization and ongoing recovery are:: return home   Choice offered to / list presented to : NA      Expected Discharge Plan and Services In-house Referral: NA Discharge Planning Services: NA   Living arrangements for the past 2 months: Single Family Home                 DME Arranged: N/A DME Agency: NA       HH Arranged: NA HH Agency: NA        Prior Living Arrangements/Services Living arrangements for the past 2 months: Single Family Home Lives with:: Spouse Patient language and need for interpreter reviewed:: Yes Do you feel safe going back to the place where you live?: Yes      Need for Family Participation in Patient Care: Yes (Comment) Care giver support system in place?: Yes (comment)   Criminal Activity/Legal Involvement Pertinent to Current Situation/Hospitalization: No - Comment as needed  Activities of Daily Living   ADL Screening (condition at time of admission) Independently performs ADLs?: Yes (appropriate for developmental age) Is the patient deaf or have difficulty hearing?: No Does the patient have difficulty seeing, even when wearing glasses/contacts?: No Does the patient have difficulty concentrating, remembering, or making decisions?: No  Permission Sought/Granted                  Emotional Assessment Appearance:: Appears stated age Attitude/Demeanor/Rapport:  Engaged Affect (typically observed): Accepting Orientation: : Oriented to Situation, Oriented to Place, Oriented to  Time, Oriented to Self Alcohol / Substance Use: Not Applicable Psych Involvement: No (comment)  Admission diagnosis:  Infection of total left knee replacement, subsequent encounter [T84.54XD] Infection of total left knee replacement [T84.54XA] Patient Active Problem List   Diagnosis Date Noted   MRSA infection 05/17/2023   Staphylococcus epidermidis infection 05/17/2023   Chronic infection of left knee (HCC) 03/24/2023   Status post revision of total replacement of left knee 08/27/2021   Status post revision of total knee replacement, left 08/14/2021   PICC (peripherally inserted central catheter) in place 06/09/2021   Vaccine counseling 05/04/2021   Skin lesion 01/01/2021   Type 2 diabetes mellitus with other specified complication (HCC) 01/01/2021   Nausea and vomiting 09/11/2020   MSSA (methicillin susceptible Staphylococcus aureus) infection 09/03/2020   Effusion, left knee 08/14/2020   Infection of total left knee replacement 08/14/2020   Status post total left knee replacement 03/21/2019   S/P carpal tunnel release right 02/22/19 03/08/2019   Carpal tunnel syndrome of right wrist    HTN (hypertension) 10/27/2018   Prostate cancer (HCC) 05/30/2017   Malignant neoplasm of prostate (HCC) 04/28/2017   Chronic pain of both knees 02/21/2017   Unilateral primary osteoarthritis, left knee 02/21/2017   Unilateral primary osteoarthritis, right knee 02/21/2017   PCP:  Janey Santos, MD Pharmacy:  [facility] [place], [place] - [address] [place] [address] Phone: [phone] Fax: [phone]     Social Drivers of Health (SDOH) Social History: SDOH Screenings   Food Insecurity: No Food Insecurity (12/16/2023)  Housing: Low Risk  (12/16/2023)  Transportation Needs: No Transportation Needs (12/16/2023)  Utilities:  Not At Risk (12/16/2023)  Depression (PHQ2-9): Low Risk  (05/23/2023)  Financial Resource Strain: Low Risk  (11/12/2021)  Social Connections: Socially Integrated (12/16/2023)  Stress: No Stress Concern Present (09/01/2020)  Tobacco Use: Low Risk  (12/16/2023)   SDOH Interventions:     Readmission Risk Interventions    03/30/2023    1:06 PM  Readmission Risk Prevention Plan  Post Dischage Appt Complete  Medication Screening Complete  Transportation Screening Complete

## 2023-12-18 NOTE — Progress Notes (Signed)
 OT Cancellation Note  Patient Details Name: [name] MRN: [id number] DOB: August 28, 1955   Cancelled Treatment:    Reason Eval/Treat Not Completed: Fatigue/lethargy limiting ability to participate. Pt attempted in AM and asked for PM visit as pt reported being very sleepy.  Returned in PM and pt declined for now as he had visitors and was fatigued after PT. Will make high priority for Monday  [name] 12/18/2023, 4:30 PM

## 2023-12-18 NOTE — Progress Notes (Signed)
 Physical Therapy Treatment Patient Details Name: [name] MRN: [id number] DOB: [birth year] Today's Date: 12/18/2023   History of Present Illness s/p Left knee excision arthroplasty with removal of all components and placement of an antibiotic spacer; arthrotomy with synovectomy all compartments left knee  on 12/16/23. PMH: DM, HTN, LLE edema, multiple L knee surgeries--LTKAs, L knee abx spacer, multiple I&Ds    PT Comments  Pt reports dizziness with sitting EOB and standing however he wants to continue mobilizing despite symptoms (pt feels it is d/t meds) however BP slightly soft at 108/56, Hgb 7.3 today. Pt was able to amb 40' with min assist and chair follow for safety; much improved activity tolerance. Continue  PT POC    If plan is discharge home, recommend the following: A lot of help with bathing/dressing/bathroom;A lot of help with walking and/or transfers;Help with stairs or ramp for entrance;Assist for transportation;Assistance with cooking/housework   Can travel by private vehicle        Equipment Recommendations  None recommended by PT    Recommendations for Other Services       Precautions / Restrictions Precautions Precautions: Fall;Knee;Other (comment) Recall of Precautions/Restrictions: Intact Required Braces or Orthoses: Other Brace Other Brace: Bledsoe brace Restrictions LLE Weight Bearing Per Provider Order: Non weight bearing     Mobility  Bed Mobility Overal bed mobility: Needs Assistance Bed Mobility: Supine to Sit     Supine to sit: Supervision, Used rails, HOB elevated     General bed mobility comments: for safety, pt able to self assist LLE to RLE    Transfers Overall transfer level: Needs assistance Equipment used: Rolling walker (2 wheels) Transfers: Sit to/from Stand Sit to Stand: Min assist           General transfer comment: cues for LLE position and hand placement, assist to slide LLE fwd    Ambulation/Gait Ambulation/Gait  assistance: Contact guard assist, Min assist Gait Distance (Feet): 40 Feet Assistive device: Rolling walker (2 wheels)   Gait velocity: decr     General Gait Details: cues for sequence, RW position,  use of UEs, and NWB on LLE, improved ability maintain NWB   Stairs             Wheelchair Mobility     Tilt Bed    Modified Rankin (Stroke Patients Only)       Balance           Standing balance support: During functional activity, Reliant on assistive device for balance, Bilateral upper extremity supported Standing balance-Leahy Scale: Poor                              Communication Communication Communication: No apparent difficulties  Cognition Arousal: Alert Behavior During Therapy: WFL for tasks assessed/performed   PT - Cognitive impairments: No apparent impairments                         Following commands: Intact      Cueing Cueing Techniques: Verbal cues  Exercises      General Comments        Pertinent Vitals/Pain Pain Assessment Pain Assessment: 0-10 Pain Score: 6  Pain Descriptors / Indicators: Guarding, Sore, Pressure Pain Intervention(s): Limited activity within patient's tolerance, Monitored during session, Premedicated before session, Repositioned, Ice applied    Home Living  Prior Function            PT Goals (current goals can now be found in the care plan section) Acute Rehab PT Goals PT Goal Formulation: With patient Time For Goal Achievement: 12/23/23 Potential to Achieve Goals: Good Progress towards PT goals: Progressing toward goals    Frequency    7X/week      PT Plan      Co-evaluation              AM-PAC PT 6 Clicks Mobility   Outcome Measure  Help needed turning from your back to your side while in a flat bed without using bedrails?: A Little Help needed moving from lying on your back to sitting on the side of a flat bed without using  bedrails?: A Little Help needed moving to and from a bed to a chair (including a wheelchair)?: A Little Help needed standing up from a chair using your arms (e.g., wheelchair or bedside chair)?: A Little Help needed to walk in hospital room?: A Little Help needed climbing 3-5 steps with a railing? : Total 6 Click Score: 16    End of Session Equipment Utilized During Treatment: Gait belt;Other (comment) (bledsoe brace) Activity Tolerance: Patient tolerated treatment well Patient left: in chair;with call bell/phone within reach;with chair alarm set;with family/visitor present Nurse Communication: Mobility status PT Visit Diagnosis: Other abnormalities of gait and mobility (R26.89)     Time: 8846-8778 PT Time Calculation (min) (ACUTE ONLY): 28 min  Charges:    $Gait Training: 23-37 mins PT General Charges $$ ACUTE PT VISIT: 1 Visit                     Rachelanne Whidby, PT  Acute Rehab Dept ([facility]) [phone]  12/18/2023    [facility] 12/18/2023, 1:12 PM

## 2023-12-19 ENCOUNTER — Encounter (HOSPITAL_COMMUNITY): Payer: Self-pay | Admitting: Orthopaedic Surgery

## 2023-12-19 DIAGNOSIS — T8454XS Infection and inflammatory reaction due to internal left knee prosthesis, sequela: Secondary | ICD-10-CM | POA: Diagnosis not present

## 2023-12-19 DIAGNOSIS — I890 Lymphedema, not elsewhere classified: Secondary | ICD-10-CM | POA: Diagnosis not present

## 2023-12-19 DIAGNOSIS — Z96652 Presence of left artificial knee joint: Secondary | ICD-10-CM | POA: Diagnosis not present

## 2023-12-19 DIAGNOSIS — E119 Type 2 diabetes mellitus without complications: Secondary | ICD-10-CM | POA: Diagnosis not present

## 2023-12-19 LAB — CK: Total CK: 127 U/L (ref 49–397)

## 2023-12-19 LAB — GLUCOSE, CAPILLARY
Glucose-Capillary: 70 mg/dL (ref 70–99)
Glucose-Capillary: 92 mg/dL (ref 70–99)
Glucose-Capillary: 127 mg/dL — ABNORMAL HIGH (ref 70–99)
Glucose-Capillary: 162 mg/dL — ABNORMAL HIGH (ref 70–99)

## 2023-12-19 LAB — PREPARE RBC (CROSSMATCH)

## 2023-12-19 MED ORDER — SODIUM CHLORIDE 0.9% IV SOLUTION: Freq: Once | INTRAVENOUS | Status: AC

## 2023-12-19 NOTE — Progress Notes (Signed)
 [facility]  Date of Admission:  12/16/2023     Lines:  Peripheral iv's   Abx: 9/27-c dapto 9/27-c cefepime  9/26 vanc                                                                  Assessment: 68 yo male dm2, hx prostate cancer, left lower chronic lymphedema, gerd, htn/hlp, index left TKA 02/2019 with recurrent knee pji admitted here for same despite being on suppressive bactrim    Time course: 07/2020 initial left knee pji s/p dair; cx mssa; s/p 6 weeks cefazolin  and couldn't tolerate rifampin , followed by suppressive cefadroxil  04/2021 recurrent left knee pji despite on abx; s/p removal of hardware and abx spacer; cx mrsa; vanc--> dapto course of 05/21/21-07/02/21; underwent new knee placement 12/16/21 recurrent swelling/pain aspiration cx negative (although was getting doxy empirically); and ultimately underwent on 03/25/2023 I&D with polyliner exchange; cx mrse. Initial 3 weeks rifampin  but couldn't tolerate so stopped; transitioned to bactrim  04/28/23 and continued on it 11/04/23 Despite being on bactrim  continue to have swelling/pain and was even seen at [facility] for second opinion and underwent I&D on this date, sent out on high dose bactrim . No cultures were done   12/16/23 readmitted for ongoing sx with decision to remove all hardware and placed abx spacer. Possible aka if unable to rid of infection     I am unclear if there is a new infection or previous infection failing treatment, certainly possible with mssa/mrsa and ConS   He has been taking bactrim  prior to surgery     I reviewed the literature with him, he is high risk obviously for whatever reason (dm2 and lymphedema), and despite 2 stage planned repair, my plan to treat for 3 months and then observe off abx for another 3-6 months to make sure no relapse prior to considering placement new joint    ---------------------------- 12/19/23 id assessment Culture reincubating  as something is growing  Hopefully it is all gpc still and we wont' have to worry about gram negative coverage       Plan: continue dapto/cefepime Continue standard isolation precaution F/u operative cx Ok to place picc any time Discussed with ortho    Principal Problem:   Infection of total left knee replacement   Allergies  Allergen Reactions   Rifampin  Nausea Only and Other (See Comments)    Skin crawling- bodyache    Scheduled Meds:  aspirin  EC  81 mg Oral BID   docusate sodium   100 mg Oral BID   escitalopram   20 mg Oral Daily   gabapentin   100 mg Oral TID   losartan   50 mg Oral Daily   And   hydrochlorothiazide   12.5 mg Oral Daily   insulin  aspart  0-15 Units Subcutaneous TID WC   insulin  aspart  0-5 Units Subcutaneous QHS   insulin  glargine  90 Units Subcutaneous Daily   pantoprazole   40 mg Oral Daily   rosuvastatin   20 mg Oral Daily   Continuous Infusions:  sodium chloride  75 mL/hr at 12/18/23 1849   ceFEPime (MAXIPIME) IV 2 g (12/19/23 0637)   DAPTOmycin  Stopped (12/18/23 1550)   PRN Meds:.acetaminophen , diphenhydrAMINE , HYDROmorphone  (DILAUDID ) injection, HYDROmorphone , methocarbamol  **OR**  methocarbamol  (ROBAXIN ) injection, metoCLOPramide  **OR** metoCLOPramide  (REGLAN ) injection, ondansetron  **OR** ondansetron  (ZOFRAN ) IV, oxyCODONE , oxyCODONE , zolpidem    SUBJECTIVE: No complaint  Review of Systems: ROS All other ROS was negative, except mentioned above     OBJECTIVE: Vitals:   12/19/23 1159 12/19/23 1210 12/19/23 1234 12/19/23 1317  BP: 120/64 122/60 (!) 106/50 (!) 110/52  Pulse: 70 77 73 68  Resp:  16 14 13   Temp: 97.9 F (36.6 C) 98.1 F (36.7 C) 98.1 F (36.7 C) 98.5 F (36.9 C)  TempSrc: Oral Oral  Oral  SpO2: 100% 95% 96% 98%  Weight:      Height:       Body mass index is 28.7 kg/m.  Physical Exam General/constitutional: no distress, pleasant HEENT: Normocephalic, PER, Conj Clear, EOMI, Oropharynx clear Neck supple CV:  rrr no mrg Lungs: clear to auscultation, normal respiratory effort Abd: Soft, Nontender Ext/msk; lle knee dressing c/d; chronic lymphedema left lower ext   Lab Results Lab Results  Component Value Date   WBC 5.9 12/18/2023   HGB 7.3 (L) 12/18/2023   HCT 23.6 (L) 12/18/2023   MCV 85.5 12/18/2023   PLT 176 12/18/2023    Lab Results  Component Value Date   CREATININE 1.10 12/17/2023   BUN 24 (H) 12/17/2023   NA 134 (L) 12/17/2023   K 4.8 12/17/2023   CL 102 12/17/2023   CO2 22 12/17/2023    Lab Results  Component Value Date   ALT 33 04/14/2023   AST 31 04/14/2023   ALKPHOS 73 05/26/2021   BILITOT 0.4 04/14/2023      Microbiology: Recent Results (from the past 240 hours)  Surgical pcr screen     Status: None   Collection Time: 12/16/23  8:48 AM   Specimen: Nasal Mucosa; Nasal Swab  Result Value Ref Range Status   MRSA, PCR NEGATIVE NEGATIVE Final   Staphylococcus aureus NEGATIVE NEGATIVE Final    Comment: (NOTE) The Xpert SA Assay (FDA approved for NASAL specimens in patients 75 years of age and older), is one component of a comprehensive surveillance program. It is not intended to diagnose infection nor to guide or monitor treatment. Performed at [facility], [address], [place], [place] [address]   Aerobic/Anaerobic Culture w Gram Stain (surgical/deep wound)     Status: None (Preliminary result)   Collection Time: 12/16/23 11:13 AM   Specimen: PATH Cytology Misc. fluid; Body Fluid  Result Value Ref Range Status   Specimen Description WOUND  Final   Special Requests LEFT KNEE JOINT FLUID  Final   Gram Stain   Final    RARE WBC PRESENT, PREDOMINANTLY PMN NO ORGANISMS SEEN Performed at [facility], [address], [place], [place] [address]    Culture   Final    CULTURE REINCUBATED FOR BETTER GROWTH NO ANAEROBES ISOLATED; CULTURE IN PROGRESS FOR 5 DAYS    Report Status PENDING  Incomplete  Aerobic/Anaerobic Culture w Gram  Stain (surgical/deep wound)     Status: None (Preliminary result)   Collection Time: 12/16/23 11:14 AM   Specimen: PATH Soft tissue  Result Value Ref Range Status   Specimen Description   Final    TISSUE Performed at [facility], [address] [place], [place] [address]    Special Requests   Final    NONE Performed at [facility], [address], [place], [place] [address]    Gram Stain   Final    RARE WBC  PRESENT, PREDOMINANTLY PMN NO ORGANISMS SEEN Performed at [facility], [address], [place], [place] [address]    Culture   Final    CULTURE REINCUBATED FOR BETTER GROWTH NO ANAEROBES ISOLATED; CULTURE IN PROGRESS FOR 5 DAYS    Report Status PENDING  Incomplete     Serology:   Imaging: If present, new imagings (plain films, ct scans, and mri) have been personally visualized and interpreted; radiology reports have been reviewed. Decision making incorporated into the Impression / Recommendations.   Mark ONEIDA Passer, MD [facility] [phone] pager    12/19/2023, 1:43 PM

## 2023-12-19 NOTE — Progress Notes (Signed)
 Patient ID: [name], male   DOB: 1955-10-17, 68 y.o.   MRN: [id number] The patient is awake and alert this morning.  His wife is at the bedside.  He does report some fatigue.  His blood pressures running just slightly low.  His hemoglobin is down to 7.3 which is definitely acute blood loss anemia from the extensive surgery he had this past Friday on his left knee.  I do appreciate infectious ease service consulting on the patient and have seen their recommendations thus far.  Therapy has been working with the patient's mobility.  On exam his dressing is clean and dry and his left foot is well-perfused.  He is in a hinged knee brace.  He will remain nonweightbearing to just light touchdown weightbearing for the foreseeable future.  He will likely need antibiotics for a long-term before considering a replant in his knee.  Given his acute blood loss anemia combined with his fatigue and low blood pressure, we will transfuse him today.  He is also awaiting a PICC line for today.  Once there are final recommendations from the infectious disease service, we will work towards discharging the patient in the next 1 to 2 days.

## 2023-12-19 NOTE — TOC Progression Note (Signed)
 Transition of Care [facility]) - Progression Note    Patient Details  Name: [name] MRN: [id number] Date of Birth: [birth year]  [facility] CM/SW Contact  Alfonse JONELLE Rex, RN Phone Number: 12/19/2023, 11:08 AM  Clinical Narrative:   [facility] consult for home iv abx. Referral sent to [facility] w/[facility]. Referral sent to [facility], rep-Cory, accepted for [facility] PT/RN, added to AVS.     Expected Discharge Plan: Home w Home Health Services Barriers to Discharge: Continued Medical Work up               Expected Discharge Plan and Services In-house Referral: NA Discharge Planning Services: NA   Living arrangements for the past 2 months: Single Family Home                 DME Arranged: N/A DME Agency: NA       HH Arranged: NA HH Agency: NA         Social Drivers of Health (SDOH) Interventions SDOH Screenings   Food Insecurity: No Food Insecurity (12/16/2023)  Housing: Low Risk  (12/16/2023)  Transportation Needs: No Transportation Needs (12/16/2023)  Utilities: Not At Risk (12/16/2023)  Depression (PHQ2-9): Low Risk  (05/23/2023)  Financial Resource Strain: Low Risk  (11/12/2021)  Social Connections: Socially Integrated (12/16/2023)  Stress: No Stress Concern Present (09/01/2020)  Tobacco Use: Low Risk  (12/16/2023)    Readmission Risk Interventions    03/30/2023    1:06 PM  Readmission Risk Prevention Plan  Post Dischage Appt Complete  Medication Screening Complete  Transportation Screening Complete

## 2023-12-19 NOTE — Progress Notes (Incomplete)
 PHARMACY CONSULT NOTE FOR:  OUTPATIENT  PARENTERAL ANTIBIOTIC THERAPY (OPAT)  Indication:  Regimen:  End date:   IV antibiotic discharge orders are pended. To discharging provider:  please sign these orders via discharge navigator,  Select New Orders & click on the button choice - Manage This Unsigned Work.     Thank you for allowing pharmacy to be a part of this patient's care.  [name] 12/19/2023, 9:10 AM

## 2023-12-19 NOTE — Evaluation (Signed)
 Occupational Therapy Evaluation Patient Details Name: [name] MRN: [id number] DOB: Nov 05, 1955 Today's Date: 12/19/2023   History of Present Illness   s/p Left knee excision arthroplasty with removal of all components and placement of an antibiotic spacer; arthrotomy with synovectomy all compartments left knee  on 12/16/23. PMH: DM, HTN, LLE edema, multiple L knee surgeries--LTKAs, L knee abx spacer, multiple I&Ds     Clinical Impressions Patient was Ind[facility]ModI at [facility] until hospitalized with current problem and is now s/p left knee revision surgery.  Patient is NWB of LLE which impacts independence with functional mobility and self-care, particularly LB dressing tasks.  Patient will benefit from OT services for training in selection and use of AD to support increased (I) with ADLs and to reduce caregiver burden.  Patient is anticipated to return home with home health nursing and PT.  Home health OT services not warranted.     If plan is discharge home, recommend the following:   A little help with walking and/or transfers;A little help with bathing/dressing/bathroom;Assist for transportation;Assistance with cooking/housework;Help with stairs or ramp for entrance     Functional Status Assessment   Patient has had a recent decline in their functional status and demonstrates the ability to make significant improvements in function in a reasonable and predictable amount of time.     Equipment Recommendations   BSC/3in1      Precautions/Restrictions   Precautions Precautions: Fall;Other (comment) (PICC line) Recall of Precautions/Restrictions: Intact Required Braces or Orthoses: Knee Immobilizer - Left (Locked at 30 deg) Knee Immobilizer - Left: On at all times Restrictions Weight Bearing Restrictions Per Provider Order: Yes LLE Weight Bearing Per Provider Order: Non weight bearing (through TTWB)     Mobility Bed Mobility Overal bed mobility: Needs Assistance Bed  Mobility: Supine to Sit    Supine to sit: Min assist, Used rails    Patient Response: Cooperative  Transfers Overall transfer level: Needs assistance Equipment used: Rolling walker (2 wheels) Transfers: Sit to/from Stand Sit to Stand: Min assist      Balance Overall balance assessment: Needs assistance Sitting-balance support: No upper extremity supported Sitting balance-Leahy Scale: Good    Standing balance support:  (NT due to pain in LLE)     ADL either performed or assessed with clinical judgement   ADL Overall ADL's : Needs assistance/impaired Eating/Feeding: Modified independent   Grooming: Set up;Sitting   Upper Body Bathing: Set up;Sitting   Lower Body Bathing: Maximal assistance;Sitting/lateral leans Lower Body Bathing Details (indicate cue type and reason): Assistance required due to limited ROM and pain in LLE Upper Body Dressing : Set up;Sitting   Lower Body Dressing: Maximal assistance;Sitting/lateral leans;Minimal assistance Lower Body Dressing Details (indicate cue type and reason): Patient demonstrated donning & doffing sock on uninvolved RLE with MinA.  Assistance required due to limited ROM and pain in LLE Toilet Transfer: Maximal assistance   Toileting- Clothing Manipulation and Hygiene: Maximal assistance Toileting - Clothing Manipulation Details (indicate cue type and reason): Assistance required due to limited ROM and pain in LLE Tub/ Shower Transfer: Walk-in shower;Maximal assistance;Shower seat   Functional mobility during ADLs: Maximal assistance;Rolling walker (2 wheels) General ADL Comments: Assistance required due to limited ROM and pain in LLE     Vision Baseline Vision/History: 1 Wears glasses Ability to See in Adequate Light: 0 Adequate Patient Visual Report: No change from baseline Vision Assessment?: Wears glasses for reading (Bifocals)     Perception Perception: Within Functional Limits      Praxis  Praxis: WFL      Pertinent  Vitals/Pain Pain Assessment Pain Assessment: 0-10 Pain Score: 0-No pain (Patient reports no pain at rest but 10/10 with movement of left knee) Pain Location: Left knee Pain Descriptors / Indicators: Stabbing Pain Intervention(s): Monitored during session, Utilized relaxation techniques, Relaxation (Ice already in place upon arrival)     Extremity/Trunk Assessment Upper Extremity Assessment Upper Extremity Assessment: Overall WFL for tasks assessed (AROM and gross strength WNL)   Lower Extremity Assessment Lower Extremity Assessment: Defer to PT evaluation      Communication Communication Communication: No apparent difficulties   Cognition  Cognition: No apparent impairments   OT - Cognition Comments: Oriented x4                  Home Living Family/patient expects to be discharged to:: Private residence Living Arrangements: Spouse/significant other Available Help at Discharge: Family Type of Home: House Home Access: Stairs to enter Secretary/administrator of Steps: 3 Entrance Stairs-Rails: Right Home Layout: One level    Bathroom Shower/Tub: Walk-in shower;Tub/shower unit (Sponge baths anticipated initially)   Bathroom Toilet: Handicapped height Bathroom Accessibility: No (Patient reports limited distance between WI shower and toilet, necessitating use of toilet in other bathroom to allow extension of L knee)   Home Equipment: Rolling Walker (2 wheels);Shower seat;Hand held shower head      Prior Functioning/Environment Prior Level of Function : Independent/Modified Independent   ADLs Comments: Patient reports decreased performance in LB dressing as LLE pain increased over time.    OT Problem List: Decreased range of motion;Decreased activity tolerance;Decreased knowledge of use of DME or AE;Pain;Increased edema   OT Treatment/Interventions: Self-care/ADL training;Energy conservation;DME and/or AE instruction;Therapeutic activities;Patient/family education       OT Goals(Current goals can be found in the care plan section)   Acute Rehab OT Goals Patient Stated Goal: Increase independence with ADL tasks to support safe return home and reduce caregiver burden OT Goal Formulation: With patient/family Time For Goal Achievement: 01/02/24 Potential to Achieve Goals: Good ADL Goals Pt Will Perform Lower Body Dressing: with contact guard assist;with adaptive equipment;sitting/lateral leans Pt Will Transfer to Toilet: with contact guard assist;ambulating;bedside commode Pt Will Perform Toileting - Clothing Manipulation and hygiene: with contact guard assist;with adaptive equipment;sitting/lateral leans   OT Frequency:  Min 2X/week       AM-PAC OT 6 Clicks Daily Activity     Outcome Measure Help from another person eating meals?: A Little Help from another person taking care of personal grooming?: A Little Help from another person toileting, which includes using toliet, bedpan, or urinal?: A Lot Help from another person bathing (including washing, rinsing, drying)?: A Lot Help from another person to put on and taking off regular upper body clothing?: A Little Help from another person to put on and taking off regular lower body clothing?: A Lot 6 Click Score: 15   End of Session Equipment Utilized During Treatment: Left knee immobilizer  Activity Tolerance: Patient tolerated treatment well;Patient limited by pain Patient left: in chair;with call bell/phone within reach;with family/visitor present  OT Visit Diagnosis: Pain;Other abnormalities of gait and mobility (R26.89) Pain - Right/Left: Left Pain - part of body: Knee                Time: 1641-1720 OT Time Calculation (min): 39 min Charges:  OT General Charges $OT Visit: 1 Visit OT Evaluation $OT Eval Moderate Complexity: 1 Mod OT Treatments $Self Care/Home Management : 23-37 mins  Belvie KATHEE Bud, MS, OTR/L  12/19/2023, 6:12 PM

## 2023-12-19 NOTE — Plan of Care (Signed)

## 2023-12-19 NOTE — Progress Notes (Signed)
 Physical Therapy Treatment Patient Details Name: [name] MRN: [id number] DOB: 1955/08/19 Today's Date: 12/19/2023   History of Present Illness s/p Left knee excision arthroplasty with removal of all components and placement of an antibiotic spacer; arthrotomy with synovectomy all compartments left knee  on 12/16/23. PMH: DM, HTN, LLE edema, multiple L knee surgeries--LTKAs, L knee abx spacer, multiple I&Ds    PT Comments  Pt continues to make steady progress, amb 46' with RW, CGA for safety, CGA for sit to supine;  No incr pain after session however pt does express being anxious regarding pain level. Pt reassured pain has improved during PT sessions each day and pain levels are to be expected  given the procedure. Continue to follow    If plan is discharge home, recommend the following: Help with stairs or ramp for entrance;Assist for transportation;Assistance with cooking/housework;A little help with bathing/dressing/bathroom;A little help with walking and/or transfers   Can travel by private vehicle        Equipment Recommendations  None recommended by PT    Recommendations for Other Services       Precautions / Restrictions Precautions Precautions: Fall;Knee;Other (comment) Recall of Precautions/Restrictions: Intact Required Braces or Orthoses: Other Brace Other Brace: Bledsoe brace Restrictions LLE Weight Bearing Per Provider Order: Non weight bearing Other Position/Activity Restrictions: per Dr. Vernetta ok to TDWB     Mobility  Bed Mobility Overal bed mobility: Needs Assistance Bed Mobility: Sit to Supine     Supine to sit: HOB elevated, Contact guard Sit to supine: Contact guard assist   General bed mobility comments: CGA for safety, pt able to self assist LLE to RLE (PT assisted to lift LLE for pt to place RLE underneath)    Transfers Overall transfer level: Needs assistance Equipment used: Rolling walker (2 wheels) Transfers: Sit to/from Stand Sit to  Stand: Min assist, Contact guard assist, From elevated surface           General transfer comment: cues for LLE position and hand placement. pt able to control descent - CGA to guide LLE fwd    Ambulation/Gait Ambulation/Gait assistance: Contact guard assist, Min assist Gait Distance (Feet): 80 Feet Assistive device: Rolling walker (2 wheels)   Gait velocity: decr     General Gait Details: cues for sequence, RW position,  step length, and NWB/TDWB  LLE   Stairs             Wheelchair Mobility     Tilt Bed    Modified Rankin (Stroke Patients Only)       Balance   Sitting-balance support: No upper extremity supported, Feet supported Sitting balance-Leahy Scale: Good     Standing balance support: During functional activity, Reliant on assistive device for balance, Bilateral upper extremity supported Standing balance-Leahy Scale: Poor                              Communication Communication Communication: No apparent difficulties  Cognition Arousal: Alert Behavior During Therapy: WFL for tasks assessed/performed   PT - Cognitive impairments: No apparent impairments                         Following commands: Intact      Cueing Cueing Techniques: Verbal cues  Exercises      General Comments        Pertinent Vitals/Pain Pain Assessment Pain Assessment: Faces Faces Pain Scale: Hurts even more  Pain Location: no pain at rest, incr to 8/10 with movement Pain Descriptors / Indicators: Guarding, Sore, Pressure Pain Intervention(s): Limited activity within patient's tolerance, Monitored during session, Premedicated before session, Repositioned    Home Living Family/patient expects to be discharged to:: Private residence Living Arrangements: Spouse/significant other Available Help at Discharge: Family Type of Home: House Home Access: Stairs to enter Entrance Stairs-Rails: Right Entrance Stairs-Number of Steps: 3   Home  Layout: One level Home Equipment: Agricultural consultant (2 wheels);Shower seat;Hand held shower head      Prior Function            PT Goals (current goals can now be found in the care plan section) Acute Rehab PT Goals PT Goal Formulation: With patient Time For Goal Achievement: 12/23/23 Potential to Achieve Goals: Good Progress towards PT goals: Progressing toward goals    Frequency    7X/week      PT Plan      Co-evaluation              AM-PAC PT 6 Clicks Mobility   Outcome Measure  Help needed turning from your back to your side while in a flat bed without using bedrails?: A Little Help needed moving from lying on your back to sitting on the side of a flat bed without using bedrails?: A Little Help needed moving to and from a bed to a chair (including a wheelchair)?: A Little Help needed standing up from a chair using your arms (e.g., wheelchair or bedside chair)?: A Little Help needed to walk in hospital room?: A Little Help needed climbing 3-5 steps with a railing? : Total 6 Click Score: 16    End of Session Equipment Utilized During Treatment: Gait belt;Other (comment) (bledsoe brace) Activity Tolerance: Patient tolerated treatment well Patient left: with call bell/phone within reach;with family/visitor present;in bed;with bed alarm set Nurse Communication: Mobility status PT Visit Diagnosis: Other abnormalities of gait and mobility (R26.89)     Time: 8270-8246 PT Time Calculation (min) (ACUTE ONLY): 24 min  Charges:    $Gait Training: 23-37 mins PT General Charges $$ ACUTE PT VISIT: 1 Visit                     Kirrah Mustin, PT  Acute Rehab Dept ([facility]) [phone]  12/19/2023    [facility] 12/19/2023, 5:56 PM

## 2023-12-19 NOTE — Progress Notes (Signed)
 Secure chat with Alejandra Soto, LPN regarding patient PICC and plan for discharge.  Per Marca, patient will discharge in 1-2 days.  Made aware that due to current PICC volume PICC would be placed 9/30 unless discharged plan changes.  Alejandra to notify IV team if patient will discharge today and then he will be prioritized for placement later today.

## 2023-12-19 NOTE — Progress Notes (Signed)
 Physical Therapy Treatment Patient Details Name: [name] MRN: [id number] DOB: 1955/06/29 Today's Date: 12/19/2023   History of Present Illness s/p Left knee excision arthroplasty with removal of all components and placement of an antibiotic spacer; arthrotomy with synovectomy all compartments left knee  on 12/16/23. PMH: DM, HTN, LLE edema, multiple L knee surgeries--LTKAs, L knee abx spacer, multiple I&Ds    PT Comments  Pt is making steady progress, transfusion ongoing (1st unit) at time of session. Pt reports dizziness (which he attributes to pain meds) and later in session lightheadedness. Pt able to amb 50' with min assist to CGA prior to needing seated rest d/t above symptoms. Pain continues to be an issue with mobility however better controlled than yesterday pm. Pt comfortable and without incr pain EOS.      If plan is discharge home, recommend the following: Help with stairs or ramp for entrance;Assist for transportation;Assistance with cooking/housework;A little help with bathing/dressing/bathroom;A little help with walking and/or transfers   Can travel by private vehicle        Equipment Recommendations  None recommended by PT    Recommendations for Other Services       Precautions / Restrictions Precautions Precautions: Fall;Knee;Other (comment) Recall of Precautions/Restrictions: Intact Required Braces or Orthoses: Other Brace Other Brace: Bledsoe brace Restrictions LLE Weight Bearing Per Provider Order: Non weight bearing Other Position/Activity Restrictions: per Dr. Vernetta ok to TDWB     Mobility  Bed Mobility Overal bed mobility: Needs Assistance Bed Mobility: Supine to Sit     Supine to sit: HOB elevated, Contact guard     General bed mobility comments: CGA for safety, pt able to self assist LLE to RLE (PT assisted to lift LLE for pt to place RLE underneath)    Transfers Overall transfer level: Needs assistance Equipment used: Rolling walker  (2 wheels) Transfers: Sit to/from Stand Sit to Stand: Min assist, Contact guard assist, From elevated surface           General transfer comment: cues for LLE position and hand placement. pt able to control descent - CGA to guide LLE fwd    Ambulation/Gait Ambulation/Gait assistance: Contact guard assist, Min assist Gait Distance (Feet): 50 Feet Assistive device: Rolling walker (2 wheels)   Gait velocity: decr     General Gait Details: cues for sequence, RW position,  step length, and NWB/TDWB  LLE   Stairs             Wheelchair Mobility     Tilt Bed    Modified Rankin (Stroke Patients Only)       Balance           Standing balance support: During functional activity, Reliant on assistive device for balance, Bilateral upper extremity supported Standing balance-Leahy Scale: Poor                              Communication Communication Communication: No apparent difficulties  Cognition Arousal: Alert Behavior During Therapy: WFL for tasks assessed/performed   PT - Cognitive impairments: No apparent impairments                         Following commands: Intact      Cueing Cueing Techniques: Verbal cues  Exercises      General Comments        Pertinent Vitals/Pain Pain Assessment Pain Assessment: Faces Faces Pain Scale: Hurts whole lot Pain  Location: no pain at rest, incr to 8/10 with movement Pain Descriptors / Indicators: Guarding, Sore, Pressure Pain Intervention(s): Limited activity within patient's tolerance, Monitored during session, Premedicated before session, Repositioned, Ice applied    Home Living                          Prior Function            PT Goals (current goals can now be found in the care plan section) Acute Rehab PT Goals PT Goal Formulation: With patient Time For Goal Achievement: 12/23/23 Potential to Achieve Goals: Good Progress towards PT goals: Progressing toward  goals    Frequency    7X/week      PT Plan      Co-evaluation              AM-PAC PT 6 Clicks Mobility   Outcome Measure  Help needed turning from your back to your side while in a flat bed without using bedrails?: A Little Help needed moving from lying on your back to sitting on the side of a flat bed without using bedrails?: A Little Help needed moving to and from a bed to a chair (including a wheelchair)?: A Little Help needed standing up from a chair using your arms (e.g., wheelchair or bedside chair)?: A Little Help needed to walk in hospital room?: A Little Help needed climbing 3-5 steps with a railing? : Total 6 Click Score: 16    End of Session Equipment Utilized During Treatment: Gait belt;Other (comment) (bledsoe brace) Activity Tolerance: Patient tolerated treatment well Patient left: in chair;with call bell/phone within reach;with chair alarm set;with family/visitor present Nurse Communication: Mobility status PT Visit Diagnosis: Other abnormalities of gait and mobility (R26.89)     Time: 8956-8885 PT Time Calculation (min) (ACUTE ONLY): 31 min  Charges:    $Gait Training: 23-37 mins PT General Charges $$ ACUTE PT VISIT: 1 Visit                     Chikita Dogan, PT  Acute Rehab Dept ([facility]) [phone]  12/19/2023    [facility] 12/19/2023, 11:27 AM

## 2023-12-20 ENCOUNTER — Inpatient Hospital Stay (HOSPITAL_COMMUNITY)

## 2023-12-20 DIAGNOSIS — T8454XD Infection and inflammatory reaction due to internal left knee prosthesis, subsequent encounter: Secondary | ICD-10-CM | POA: Diagnosis not present

## 2023-12-20 LAB — CBC
HCT: 31.2 % — ABNORMAL LOW (ref 39.0–52.0)
Hemoglobin: 10.1 g/dL — ABNORMAL LOW (ref 13.0–17.0)
MCH: 27.5 pg (ref 26.0–34.0)
MCHC: 32.4 g/dL (ref 30.0–36.0)
MCV: 85 fL (ref 80.0–100.0)
Platelets: 181 K/uL (ref 150–400)
RBC: 3.67 MIL/uL — ABNORMAL LOW (ref 4.22–5.81)
RDW: 14.4 % (ref 11.5–15.5)
WBC: 5.3 K/uL (ref 4.0–10.5)
nRBC: 0.4 % — ABNORMAL HIGH (ref 0.0–0.2)

## 2023-12-20 LAB — GLUCOSE, CAPILLARY
Glucose-Capillary: 124 mg/dL — ABNORMAL HIGH (ref 70–99)
Glucose-Capillary: 138 mg/dL — ABNORMAL HIGH (ref 70–99)
Glucose-Capillary: 157 mg/dL — ABNORMAL HIGH (ref 70–99)
Glucose-Capillary: 177 mg/dL — ABNORMAL HIGH (ref 70–99)

## 2023-12-20 LAB — BPAM RBC
Blood Product Expiration Date: 202510272359
Blood Product Expiration Date: 202510272359
ISSUE DATE / TIME: 202509290815
ISSUE DATE / TIME: 202509291214
Unit Type and Rh: 5100
Unit Type and Rh: 5100

## 2023-12-20 LAB — TYPE AND SCREEN
ABO/RH(D): O POS
Antibody Screen: NEGATIVE
Unit division: 0
Unit division: 0

## 2023-12-20 LAB — PSA: Prostatic Specific Antigen: 1.93 ng/mL (ref 0.00–4.00)

## 2023-12-20 MED ORDER — SODIUM CHLORIDE 0.9% FLUSH: 10.0000 mL | INTRAVENOUS | Status: DC | PRN

## 2023-12-20 MED ORDER — CHLORHEXIDINE GLUCONATE CLOTH 2 % EX PADS
6.0000 | MEDICATED_PAD | Freq: Every day | CUTANEOUS | Status: DC
  Administered 2023-12-20 – 2023-12-22 (×3): 6 via TOPICAL

## 2023-12-20 NOTE — Progress Notes (Signed)
 PIV removed, new fluids and tubing provided, CHG wipes done. Educated patient and wife on CHG protocol.

## 2023-12-20 NOTE — Progress Notes (Signed)
 PT Cancellation Note  Patient Details Name: [name] MRN: [id number] DOB: [birth year]   Cancelled Treatment:    Reason Eval/Treat Not Completed: pt declines OOB this pm, has multiple family members visiting. Pt spouse states that TOC informed them that  they cannot get a w/c because they got a RW <29yrs ago. Pt will need w/c for home entry. Discussed necessary w/c options if they should acquire one on their own.   Rexene, PT  Acute Rehab Dept [facility]) [phone]  12/20/2023  TRUDY REXENE 12/20/2023, 5:03 PM

## 2023-12-20 NOTE — Progress Notes (Signed)
 Pharmacy: Antimicrobial Stewardship Note  46 YOM with history of recurrent L-knee PJI with prior isolates including MRSE (Jan '25), MRSA (Feb '23), MSSA (May '22) and now with repeat I&D this admission with cultures updated to show:  Aerobic/Anaerobic Culture w Gram Stain (surgical/deep wound) [[id number]] Collected: 12/16/23 1114  Order Status: Completed Specimen: PATH Soft tissue Updated: 12/19/23 1456   Specimen Description --   TISSUE Performed at [facility], [address] [place], [place] [address]   Special Requests --   NONE Performed at [facility], [address], [place], [place] [address]   Gram Stain --   RARE WBC PRESENT, PREDOMINANTLY PMN NO ORGANISMS SEEN Performed at [facility], [address], [place], [place] [address]   Culture --   RARE STAPHYLOCOCCUS EPIDERMIDIS NO ANAEROBES ISOLATED; CULTURE IN PROGRESS FOR 5 DAYS   Report Status PENDING   Discussed with Dr. Overton this AM and will drop the Cefepime and continue Daptomycin  monotherapy for now.   Plan - D/c Cefepime - Continue Daptomycin  700 mg IV every 24 hours  Thank you for allowing pharmacy to be a part of this patient's care.  Almarie Lunger, PharmD, BCPS, BCIDP Infectious Diseases Clinical Pharmacist 12/20/2023 8:53 AM   **Pharmacist phone directory can now be found on [email or web address] ([email or web address]).  Listed under [facility].

## 2023-12-20 NOTE — Progress Notes (Signed)
 Occupational Therapy Treatment Patient Details Name: [name] MRN: [id number] DOB: 02-May-1955 Today's Date: 12/20/2023   History of present illness Patient is a 68 year old male s/p Left knee excision arthroplasty with removal of all components and placement of an antibiotic spacer; arthrotomy with synovectomy all compartments left knee on 12/16/23. PMHx includes DM II, HTN, LLE edema, multiple L knee surgeries--LTKAs, L knee abx spacer, multiple I&Ds   OT comments  Arrived to find pt in high fowlers with rad tech present to perform imaging of newly placed PICC line.  Spoke with spouse in hallway, discussing options for AD presented previous visit to refine choices that may be most appropriate to support pt's LB dressing tasks.  Provided images and description of [employer] which has a proven track record for dressing.  Entered room once rad tech departed and greeted pt.  Pt reported no pain at rest.  Provided education regarding toilet transfers using 3 in 1 commode with emphasis on use of BUE to raise & lower from seat while maintaining LLE extension through knee.  Advised pt, who is similar build to therapist, that therapist's hips cleared frame of 3 in 1 during demonstration.  Showed pt how height of 3 in 1 can be adjusted.  Discussed AD choices with pt and assured that they do not have to be acquired prior to D/C, given that spouse has been providing support prior to admission.      If plan is discharge home, recommend the following:  A little help with walking and/or transfers;A little help with bathing/dressing/bathroom;Assist for transportation   Equipment Recommendations  BSC/3in1       Precautions / Restrictions Precautions Precautions: Fall Precaution/Restrictions Comments: PICC line RUE Required Braces or Orthoses: Knee Immobilizer - Left (Locked at 30 deg) Knee Immobilizer - Left: On at all times Restrictions Weight Bearing Restrictions Per Provider Order:  Yes LLE Weight Bearing Per Provider Order: Non weight bearing (through TTWB)       Mobility Bed Mobility Overal bed mobility: Needs Assistance Bed Mobility: Supine to Sit    Supine to sit: Contact guard, Used rails, HOB elevated Sit to supine: Contact guard assist     Transfers Overall transfer level: Needs assistance Equipment used: Rolling walker (2 wheels) Transfers: Sit to/from Stand Sit to Stand: Contact guard assist, Min assist, From elevated surface     Balance Overall balance assessment: Needs assistance Sitting-balance support: No upper extremity supported, Feet supported Sitting balance-Leahy Scale: Good    Standing balance support: Bilateral upper extremity supported Standing balance-Leahy Scale: Poor     ADL either performed or assessed with clinical judgement   ADL   Eating/Feeding: Modified independent   Grooming: Set up;Bed level   Upper Body Bathing: Set up;Bed level   Lower Body Bathing: Maximal assistance;Bed level   Upper Body Dressing : Contact guard assist;Bed level   Lower Body Dressing: Maximal assistance;Bed level   Toilet Transfer: BSC/3in1;Minimal assistance   Toileting- Clothing Manipulation and Hygiene: Minimal assistance;Sitting/lateral lean   Tub/ Shower Transfer: Moderate assistance;Rolling walker (2 wheels);Shower seat   Functional mobility during ADLs: Contact guard assist;Minimal assistance;Rolling walker (2 wheels)      Extremity/Trunk Assessment Upper Extremity Assessment Upper Extremity Assessment: Defer to OT evaluation      Vision Baseline Vision/History: 1 Wears glasses Ability to See in Adequate Light: 0 Adequate Patient Visual Report: No change from baseline     Perception Perception Perception: Not tested   Praxis Praxis Praxis: Not tested   Communication  Communication Communication: No apparent difficulties   Cognition Arousal: Alert Behavior During Therapy: WFL for tasks  assessed/performed Cognition: No apparent impairments   Following commands: Intact        Cueing   Cueing Techniques: Verbal cues             Pertinent Vitals/ Pain       Pain Assessment Pain Assessment: No/denies pain (at rest) Pain Intervention(s): Monitored during session   Frequency  Min 2X/week        Progress Toward Goals  OT Goals(current goals can now be found in the care plan section)  Progress towards OT goals: Progressing toward goals  Acute Rehab OT Goals Patient Stated Goal: Increase independence with ADL tasks to support safe return home and reduce caregiver burden OT Goal Formulation: With patient/family Time For Goal Achievement: 01/02/24 Potential to Achieve Goals: Good  Plan         AM-PAC OT 6 Clicks Daily Activity     Outcome Measure   Help from another person eating meals?: None Help from another person taking care of personal grooming?: A Little Help from another person toileting, which includes using toliet, bedpan, or urinal?: A Lot Help from another person bathing (including washing, rinsing, drying)?: A Lot Help from another person to put on and taking off regular upper body clothing?: A Little Help from another person to put on and taking off regular lower body clothing?: A Lot 6 Click Score: 16    End of Session Equipment Utilized During Treatment: Other (comment) (3 in 1 commode)  OT Visit Diagnosis: Other abnormalities of gait and mobility (R26.89)   Activity Tolerance Patient tolerated treatment well   Patient Left in bed; with family/visitor present and call button within reach   Nurse Communication          Time: 0955-1010 OT Time Calculation (min): 15 min  Charges: OT General Charges $OT Visit: 1 Visit OT Treatments $Therapeutic Activity: 8-22 mins    Belvie KATHEE Bud, MS, OTR/L 12/20/2023, 2:30 PM

## 2023-12-20 NOTE — Progress Notes (Incomplete)
 Dapto MIC Tedizolid

## 2023-12-20 NOTE — Progress Notes (Signed)
 Physical Therapy Treatment Patient Details Name: [name] MRN: [id number] DOB: 09/13/1955 Today's Date: 12/20/2023   History of Present Illness Patient is a 68 year old male s/p Left knee excision arthroplasty with removal of all components and placement of an antibiotic spacer; arthrotomy with synovectomy all compartments left knee on 12/16/23. PMHx includes DM II, HTN, LLE edema, multiple L knee surgeries--LTKAs, L knee abx spacer, multiple I&Ds    PT Comments  Pt with severe pain limiting mobility this afternoon. RN gave additional pain meds during session. Lengthy discussion regarding pain control and how muscle spasms are contributing to pain, in addition to knee instability. Pt asking multiple questions regarding AKA and rehab course, questions answered within scope. Discussed likely need for temporary ramp, pt will need w/c with ELRs also.   If plan is discharge home, recommend the following: Help with stairs or ramp for entrance;Assist for transportation;Assistance with cooking/housework;A little help with bathing/dressing/bathroom;A little help with walking and/or transfers   Can travel by private Theme park manager (measurements PT)    Recommendations for Other Services       Precautions / Restrictions Precautions Precautions: Fall Required Braces or Orthoses: Other Brace Knee Immobilizer - Left: On at all times Other Brace: Bledsoe brace locked at 30 degrees Restrictions Weight Bearing Restrictions Per Provider Order: Yes LLE Weight Bearing Per Provider Order: Non weight bearing Other Position/Activity Restrictions: per Dr. Vernetta ok to TDWB     Mobility  Bed Mobility               General bed mobility comments: unable d/t pain    Transfers                        Ambulation/Gait                   Stairs             Wheelchair Mobility     Tilt Bed    Modified Rankin (Stroke  Patients Only)       Balance                                            Communication Communication Communication: No apparent difficulties  Cognition Arousal: Alert Behavior During Therapy: WFL for tasks assessed/performed   PT - Cognitive impairments: No apparent impairments                         Following commands: Intact      Cueing Cueing Techniques: Verbal cues  Exercises      General Comments        Pertinent Vitals/Pain Pain Assessment Pain Assessment: Faces Faces Pain Scale: Hurts worst Pain Location: no pain at rest, incr to 10/10 with movement Pain Descriptors / Indicators: Guarding, Sore, Pressure Pain Intervention(s): Limited activity within patient's tolerance, Monitored during session, Patient requesting pain meds-RN notified, RN gave pain meds during session, Premedicated before session, Repositioned    Home Living                          Prior Function            PT Goals (current goals can now be found in the care plan section) Acute  Rehab PT Goals PT Goal Formulation: With patient Time For Goal Achievement: 12/23/23 Potential to Achieve Goals: Good Progress towards PT goals: Not progressing toward goals - comment (pain)    Frequency    7X/week      PT Plan      Co-evaluation              AM-PAC PT 6 Clicks Mobility   Outcome Measure  Help needed turning from your back to your side while in a flat bed without using bedrails?: A Little Help needed moving from lying on your back to sitting on the side of a flat bed without using bedrails?: A Little Help needed moving to and from a bed to a chair (including a wheelchair)?: A Little Help needed standing up from a chair using your arms (e.g., wheelchair or bedside chair)?: A Little Help needed to walk in hospital room?: A Little Help needed climbing 3-5 steps with a railing? : Total 6 Click Score: 16    End of Session   Activity  Tolerance: Patient tolerated treatment well Patient left: with call bell/phone within reach;with family/visitor present;in bed;with bed alarm set   PT Visit Diagnosis: Other abnormalities of gait and mobility (R26.89)     Time: 8569-8545 PT Time Calculation (min) (ACUTE ONLY): 24 min  Charges:    $Self Care/Home Management: 23-37 PT General Charges $$ ACUTE PT VISIT: 1 Visit                     Rexene, PT  Acute Rehab Dept ([facility]) [phone]  12/20/2023    [facility] 12/20/2023, 3:09 PM

## 2023-12-20 NOTE — Progress Notes (Signed)
 [facility]  Date of Admission:  12/16/2023     Lines:  Peripheral iv's   Abx: 9/27-c dapto 9/27-c cefepime  9/26 vanc                                                                  Assessment: 68 yo male dm2, hx prostate cancer, left lower chronic lymphedema, gerd, htn/hlp, index left TKA 02/2019 with recurrent knee pji admitted here for same despite being on suppressive bactrim    Time course: 07/2020 initial left knee pji s/p dair; cx mssa; s/p 6 weeks cefazolin  and couldn't tolerate rifampin , followed by suppressive cefadroxil  04/2021 recurrent left knee pji despite on abx; s/p removal of hardware and abx spacer; cx mrsa; vanc--> dapto course of 05/21/21-07/02/21; underwent new knee placement 12/16/21 recurrent swelling/pain aspiration cx negative (although was getting doxy empirically); and ultimately underwent on 03/25/2023 I&D with polyliner exchange; cx mrse. Initial 3 weeks rifampin  but couldn't tolerate so stopped; transitioned to bactrim  04/28/23 and continued on it 11/04/23 Despite being on bactrim  continue to have swelling/pain and was even seen at [facility] for second opinion and underwent I&D on this date, sent out on high dose bactrim . No cultures were done   12/16/23 readmitted for ongoing sx with decision to remove all hardware and placed abx spacer. Possible aka if unable to rid of infection     I am unclear if there is a new infection or previous infection failing treatment, certainly possible with mssa/mrsa and ConS   He has been taking bactrim  prior to surgery     I reviewed the literature with him, he is high risk obviously for whatever reason (dm2 and lymphedema), and despite 2 stage planned repair, my plan to treat for 3 months and then observe off abx for another 3-6 months to make sure no relapse prior to considering placement new joint    ---------------------------- 12/19/23 id assessment Culture reincubating  as something is growing  Hopefully it is all gpc still and we wont' have to worry about gram negative coverage   -------- 12/20/23 id assessment Cx finalized mrse (intermediate susceptibility to tetra and r bactrim )  Explained to him again we are sticking to 3 month tx planned and will keep on dapto for as long as possible before transitioning to linezolid vs tedezolid (8 and 4 weeks)    Plan: Continue dapto; opat below Planned 8 weeks dapto from 9/26 and then finish 4 more weeks with linezolid vs tedezolid Continue standard isolation precaution Id will sign off  Mic testing requested on dapto for mrse   OPAT Orders Discharge antibiotics to be given via PICC line Discharge antibiotics: Daptomycin    Duration: 8 weeks from 9/26 End Date: 02/10/24  [facility] Care Per Protocol:  Home health RN for IV administration and teaching; PICC line care and labs.    Labs weekly while on IV antibiotics: _x_ CBC with differential __ BMP _x_ CMP _x_ CRP _x_ ESR __ Vancomycin  trough _x_ CK  _x_ Please pull PIC at completion of IV antibiotics __ Please leave PIC in place until doctor has seen patient or been notified  Fax weekly labs to [phone]  Clinic Follow Up Appt: 10/28 @ 330  @  [facility] [address], [place], [place] [address] Phone: [phone]     Principal Problem:   Infection of total left knee replacement   Allergies  Allergen Reactions   Rifampin  Nausea Only and Other (See Comments)    Skin crawling- bodyache    Scheduled Meds:  aspirin  EC  81 mg Oral BID   Chlorhexidine  Gluconate Cloth  6 each Topical Daily   docusate sodium   100 mg Oral BID   escitalopram   20 mg Oral Daily   gabapentin   100 mg Oral TID   losartan   50 mg Oral Daily   And   hydrochlorothiazide   12.5 mg Oral Daily   insulin  aspart  0-15 Units Subcutaneous TID WC   insulin  aspart  0-5 Units Subcutaneous QHS   insulin  glargine  90 Units Subcutaneous Daily    pantoprazole   40 mg Oral Daily   rosuvastatin   20 mg Oral Daily   Continuous Infusions:  sodium chloride  75 mL/hr at 12/18/23 1849   DAPTOmycin  700 mg (12/20/23 1427)   PRN Meds:.acetaminophen , diphenhydrAMINE , HYDROmorphone  (DILAUDID ) injection, HYDROmorphone , methocarbamol  **OR** methocarbamol  (ROBAXIN ) injection, metoCLOPramide  **OR** metoCLOPramide  (REGLAN ) injection, ondansetron  **OR** ondansetron  (ZOFRAN ) IV, oxyCODONE , oxyCODONE , sodium chloride  flush, zolpidem    SUBJECTIVE: No complaint  Review of Systems: ROS All other ROS was negative, except mentioned above     OBJECTIVE: Vitals:   12/19/23 1445 12/19/23 2301 12/20/23 0604 12/20/23 1429  BP: (!) 124/57 137/66 (!) 115/57 (!) 130/57  Pulse: 68 74 70 66  Resp: 16 18 18 16   Temp: 98.2 F (36.8 C) 98.1 F (36.7 C) 98.6 F (37 C) 98.5 F (36.9 C)  TempSrc: Oral Oral Oral Oral  SpO2: 98% 98% 97% 96%  Weight:      Height:       Body mass index is 28.7 kg/m.  Physical Exam General/constitutional: no distress, pleasant HEENT: Normocephalic, PER, Conj Clear, EOMI, Oropharynx clear Neck supple CV: rrr no mrg Lungs: clear to auscultation, normal respiratory effort Abd: Soft, Nontender Ext/msk; lle knee dressing c/d; chronic lymphedema left lower ext   Lab Results Lab Results  Component Value Date   WBC 5.3 12/20/2023   HGB 10.1 (L) 12/20/2023   HCT 31.2 (L) 12/20/2023   MCV 85.0 12/20/2023   PLT 181 12/20/2023    Lab Results  Component Value Date   CREATININE 1.10 12/17/2023   BUN 24 (H) 12/17/2023   NA 134 (L) 12/17/2023   K 4.8 12/17/2023   CL 102 12/17/2023   CO2 22 12/17/2023    Lab Results  Component Value Date   ALT 33 04/14/2023   AST 31 04/14/2023   ALKPHOS 73 05/26/2021   BILITOT 0.4 04/14/2023      Microbiology: Recent Results (from the past 240 hours)  Surgical pcr screen     Status: None   Collection Time: 12/16/23  8:48 AM   Specimen: Nasal Mucosa; Nasal Swab  Result Value  Ref Range Status   MRSA, PCR NEGATIVE NEGATIVE Final   Staphylococcus aureus NEGATIVE NEGATIVE Final    Comment: (NOTE) The Xpert SA Assay (FDA approved for NASAL specimens in patients 43 years of age and older), is one component of a comprehensive surveillance program. It is not intended to diagnose infection nor to guide or monitor treatment. Performed at [facility], [address], [place], [place] [address]   Aerobic/Anaerobic Culture w Gram Stain (surgical/deep wound)     Status: None (Preliminary result)   Collection Time: 12/16/23 11:13  AM   Specimen: PATH Cytology Misc. fluid; Body Fluid  Result Value Ref Range Status   Specimen Description WOUND  Final   Special Requests LEFT KNEE JOINT FLUID  Final   Gram Stain   Final    RARE WBC PRESENT, PREDOMINANTLY PMN NO ORGANISMS SEEN Performed at [facility], [address], [place], [place] [address]    Culture   Final    RARE STAPHYLOCOCCUS EPIDERMIDIS SUSCEPTIBILITIES PERFORMED ON PREVIOUS CULTURE WITHIN THE LAST 5 DAYS. NO ANAEROBES ISOLATED; CULTURE IN PROGRESS FOR 5 DAYS    Report Status PENDING  Incomplete  Aerobic/Anaerobic Culture w Gram Stain (surgical/deep wound)     Status: None (Preliminary result)   Collection Time: 12/16/23 11:14 AM   Specimen: PATH Soft tissue  Result Value Ref Range Status   Specimen Description TISSUE  Final   Special Requests NONE  Final   Gram Stain   Final    RARE WBC PRESENT, PREDOMINANTLY PMN NO ORGANISMS SEEN    Culture   Final    RARE STAPHYLOCOCCUS EPIDERMIDIS NO ANAEROBES ISOLATED; CULTURE IN PROGRESS FOR 5 DAYS    Report Status PENDING  Incomplete   Organism ID, Bacteria STAPHYLOCOCCUS EPIDERMIDIS  Final      Susceptibility   Staphylococcus epidermidis - MIC*    CIPROFLOXACIN <=0.5 SENSITIVE Sensitive     ERYTHROMYCIN >=8 RESISTANT Resistant     GENTAMICIN <=0.5 SENSITIVE Sensitive     OXACILLIN <=0.25 SENSITIVE Sensitive     TETRACYCLINE 8  INTERMEDIATE Intermediate     VANCOMYCIN  1 SENSITIVE Sensitive     TRIMETH /SULFA  80 RESISTANT Resistant     CLINDAMYCIN <=0.25 SENSITIVE Sensitive     RIFAMPIN  <=0.5 SENSITIVE Sensitive     Inducible Clindamycin NEGATIVE Sensitive     LINEZOLID Value in next row Sensitive      1 SENSITIVEPerformed at [facility], [address] [place], [place] [address]    * RARE STAPHYLOCOCCUS EPIDERMIDIS     Serology:   Imaging: If present, new imagings (plain films, ct scans, and mri) have been personally visualized and interpreted; radiology reports have been reviewed. Decision making incorporated into the Impression / Recommendations.   Mark ONEIDA Passer, [name] [facility] [phone] pager    12/20/2023, 6:00 PM

## 2023-12-20 NOTE — Plan of Care (Signed)
  Problem: Health Behavior/Discharge Planning: Goal: Ability to manage health-related needs will improve Outcome: Progressing   Problem: Clinical Measurements: Goal: Respiratory complications will improve Outcome: Progressing Goal: Cardiovascular complication will be avoided Outcome: Progressing   Problem: Activity: Goal: Risk for activity intolerance will decrease Outcome: Progressing   Problem: Nutrition: Goal: Adequate nutrition will be maintained Outcome: Progressing   Problem: Coping: Goal: Level of anxiety will decrease Outcome: Progressing   Problem: Elimination: Goal: Will not experience complications related to urinary retention Outcome: Progressing   Problem: Pain Managment: Goal: General experience of comfort will improve and/or be controlled Outcome: Progressing   Problem: Safety: Goal: Ability to remain free from injury will improve Outcome: Progressing   Problem: Skin Integrity: Goal: Risk for impaired skin integrity will decrease Outcome: Progressing   Problem: Education: Goal: Knowledge of General Education information will improve Description: Including pain rating scale, medication(s)/side effects and non-pharmacologic comfort measures Outcome: Progressing   Problem: Clinical Measurements: Goal: Ability to maintain clinical measurements within normal limits will improve Outcome: Progressing   Problem: Nutrition: Goal: Adequate nutrition will be maintained Outcome: Progressing   Problem: Coping: Goal: Level of anxiety will decrease Outcome: Progressing   Problem: Elimination: Goal: Will not experience complications related to urinary retention Outcome: Progressing

## 2023-12-20 NOTE — Progress Notes (Signed)
 Patient ID: [name], male   DOB: 1955-12-04, 68 y.o.   MRN: [id number] The patient is awake and alert.  His wife at the bedside.  He is eating well.  His PICC line is in place.  His hemoglobin came up to 10.1 from the transfusion and he feels good overall and his vital signs are stable.  His white blood cell count is normal.  I believe that the infectious disease service is working with her final recommendations for antibiotics.  The cultures have grown out Staph epidermidis and he has been switched to appropriate antibiotics for covering this organism.  We did change the patient's left knee dressing at the bedside and his incision is clean and dry and looks good overall.  We placed a new dry dressing and put him back in his hinged knee brace.  He will remain nonweightbearing to just toe-touch weig epidermidis bearing on the left lower extremity for the next 3+ months.  He would like to work with therapy at least again today and tomorrow with discharge to home tomorrow.  I think this is reasonable for discharge to home tomorrow.

## 2023-12-20 NOTE — TOC Progression Note (Signed)
 Transition of Care [facility]) - Progression Note    Patient Details  Name: [name] MRN: [id number] Date of Birth: 12-Apr-1955  Transition of Care [facility]) CM/SW Contact  Alfonse JONELLE Rex, RN Phone Number: 12/20/2023, 3:06 PM  Clinical Narrative:   Met with patient and spouse at bedside per spouse request. Inquired about coverage for [facility], informed [facility] is not covered by Medicare, spouse voiced understanding. Spouse inquiring about medication assistance for IV Daptomycin , reviewed with spouse that there is no medication assistance for iv home abx, patient has private insurance.  While at bedside , PT states patient will need a wheelchair, confirmed with Mitch w/[employer], patient received a RW in 2023 covered by insurance. NCM informed spouse , w/c will be private pay.  NCM will continue to follow.     Expected Discharge Plan: Home w Home Health Services Barriers to Discharge: Continued Medical Work up               Expected Discharge Plan and Services In-house Referral: NA Discharge Planning Services: NA   Living arrangements for the past 2 months: Single Family Home                 DME Arranged: N/A DME Agency: NA       HH Arranged: NA HH Agency: NA         Social Drivers of Health (SDOH) Interventions SDOH Screenings   Food Insecurity: No Food Insecurity (12/16/2023)  Housing: Low Risk  (12/16/2023)  Transportation Needs: No Transportation Needs (12/16/2023)  Utilities: Not At Risk (12/16/2023)  Depression (PHQ2-9): Low Risk  (05/23/2023)  Financial Resource Strain: Low Risk  (11/12/2021)  Social Connections: Socially Integrated (12/16/2023)  Stress: No Stress Concern Present (09/01/2020)  Tobacco Use: Low Risk  (12/16/2023)    Readmission Risk Interventions    03/30/2023    1:06 PM  Readmission Risk Prevention Plan  Post Dischage Appt Complete  Medication Screening Complete  Transportation Screening Complete

## 2023-12-20 NOTE — Progress Notes (Signed)
 PHARMACY CONSULT NOTE FOR:  OUTPATIENT  PARENTERAL ANTIBIOTIC THERAPY (OPAT)  Indication: PJI Regimen: Daptomycin  700mg  IV every 24 hours End date: 01/27/24  IV antibiotic discharge orders are pended. To discharging provider:  please sign these orders via discharge navigator,  Select New Orders & click on the button choice - Manage This Unsigned Work.     Thank you for allowing pharmacy to be a part of this patient's care.  Dionicia Canavan, PharmD, RPh PGY1 Acute Care Pharmacy Resident [facility]  12/20/2023 2:15 PM  12/20/2023, 2:14 PM

## 2023-12-20 NOTE — Progress Notes (Signed)
 Peripherally Inserted Central Catheter Placement  The IV Nurse has discussed with the patient and/or persons authorized to consent for the patient, the purpose of this procedure and the potential benefits and risks involved with this procedure.  The benefits include less needle sticks, lab draws from the catheter, and the patient may be discharged home with the catheter. Risks include, but not limited to, infection, bleeding, blood clot (thrombus formation), and puncture of an artery; nerve damage and irregular heartbeat and possibility to perform a PICC exchange if needed/ordered by physician.  Alternatives to this procedure were also discussed.  Bard Power PICC patient education guide, fact sheet on infection prevention and patient information card has been provided to patient /or left at bedside.    PICC Placement Documentation  PICC Single Lumen 12/20/23 Right Cephalic 38 cm 0 cm (Active)  Indication for Insertion or Continuance of Line Home intravenous therapies (PICC only) 12/20/23 0930  Exposed Catheter (cm) 0 cm 12/20/23 0930  Site Assessment Clean, Dry, Intact 12/20/23 0930  Line Status Saline locked;Blood return noted 12/20/23 0930  Dressing Type Transparent;Securing device 12/20/23 0930  Dressing Status Antimicrobial disc/dressing in place;Clean, Dry, Intact 12/20/23 0930  Line Care Connections checked and tightened 12/20/23 0930  Line Adjustment (NICU/IV Team Only) No 12/20/23 0930  Dressing Intervention New dressing 12/20/23 0930  Dressing Change Due 12/26/23 12/20/23 0930       Matthias Merle Chenice 12/20/2023, 10:25 AM

## 2023-12-21 ENCOUNTER — Telehealth (HOSPITAL_COMMUNITY): Payer: Self-pay | Admitting: Pharmacy Technician

## 2023-12-21 ENCOUNTER — Other Ambulatory Visit (HOSPITAL_COMMUNITY): Payer: Self-pay

## 2023-12-21 LAB — AEROBIC/ANAEROBIC CULTURE W GRAM STAIN (SURGICAL/DEEP WOUND): Culture: NO GROWTH

## 2023-12-21 LAB — GLUCOSE, CAPILLARY
Glucose-Capillary: 99 mg/dL (ref 70–99)
Glucose-Capillary: 141 mg/dL — ABNORMAL HIGH (ref 70–99)
Glucose-Capillary: 182 mg/dL — ABNORMAL HIGH (ref 70–99)
Glucose-Capillary: 80 mg/dL (ref 70–99)

## 2023-12-21 NOTE — Telephone Encounter (Signed)
 Patient Product/process development scientist completed.    The patient is insured through [employer]. Patient has Medicare and is not eligible for a copay card, but may be able to apply for patient assistance or Medicare RX Payment Plan (Patient Must reach out to their plan, if eligible for payment plan), if available.    Ran test claim for Sivextro 200 mg and Not on Formulary   This test claim was processed through [facility]- copay amounts may vary at other pharmacies due to [employer], or as the patient moves through the different stages of their insurance plan.     Reyes Sharps, CPHT Pharmacy Technician III Certified Patient Advocate [facility] Patient Advocate Team Direct Number: [phone]  Fax: [phone]

## 2023-12-21 NOTE — Progress Notes (Signed)
 Physical Therapy Treatment Patient Details Name: [name] MRN: [id number] DOB: Jun 11, 1955 Today's Date: 12/21/2023   History of Present Illness Patient is a 68 year old male s/p Left knee excision arthroplasty with removal of all components and placement of an antibiotic spacer; arthrotomy with synovectomy all compartments left knee on 12/16/23. PMHx includes DM II, HTN, LLE edema, multiple L knee surgeries--LTKAs, L knee abx spacer, multiple I&Ds    PT Comments  Pt ambulated 34' with RW with VCs for sequencing and for TDWB status, distance limited by mild lightheadedness.  Pt performed bed level LLE strengthening exercises. Good tolerance of activity today.     If plan is discharge home, recommend the following: Help with stairs or ramp for entrance;Assist for transportation;Assistance with cooking/housework;A little help with bathing/dressing/bathroom;A little help with walking and/or transfers   Can travel by private vehicle        Equipment Recommendations  Wheelchair (measurements PT) (with elevating leg rests)    Recommendations for Other Services       Precautions / Restrictions Precautions Precautions: Fall Precaution/Restrictions Comments: PICC line RUE Required Braces or Orthoses: Other Brace Knee Immobilizer - Left: On at all times Other Brace: Bledsoe brace locked at 30 degrees Restrictions Weight Bearing Restrictions Per Provider Order: Yes LLE Weight Bearing Per Provider Order: Non weight bearing Other Position/Activity Restrictions: per Dr. Vernetta ok to TDWB     Mobility  Bed Mobility Overal bed mobility: Needs Assistance Bed Mobility: Supine to Sit     Supine to sit: Contact guard     General bed mobility comments: guarded LLE; pt used gait belt and self assisted LLE with RLE for OOB    Transfers Overall transfer level: Needs assistance Equipment used: Rolling walker (2 wheels) Transfers: Sit to/from Stand Sit to Stand: Contact guard  assist, From elevated surface           General transfer comment: cues for LLE position and hand placement. pt able to control descent - CGA to guide LLE fwd    Ambulation/Gait Ambulation/Gait assistance: Contact guard assist Gait Distance (Feet): 65 Feet Assistive device: Rolling walker (2 wheels) Gait Pattern/deviations: Step-to pattern Gait velocity: decr     General Gait Details: cues for sequence, RW position,  step length, and TDWB  LLE   Stairs             Wheelchair Mobility     Tilt Bed    Modified Rankin (Stroke Patients Only)       Balance Overall balance assessment: Needs assistance Sitting-balance support: No upper extremity supported, Feet supported Sitting balance-Leahy Scale: Good     Standing balance support: Bilateral upper extremity supported Standing balance-Leahy Scale: Poor                              Communication Communication Communication: No apparent difficulties  Cognition Arousal: Alert Behavior During Therapy: WFL for tasks assessed/performed   PT - Cognitive impairments: No apparent impairments                         Following commands: Intact      Cueing Cueing Techniques: Verbal cues  Exercises Total Joint Exercises Ankle Circles/Pumps: AROM, Both, 10 reps, Supine Quad Sets: AROM, Both, 5 reps, Supine Gluteal Sets: AROM, Both, 5 reps, Supine    General Comments        Pertinent Vitals/Pain Pain Assessment Pain Score: 2  Pain Location: L knee with walking Pain Descriptors / Indicators: Sore Pain Intervention(s): Limited activity within patient's tolerance, Monitored during session, Premedicated before session, Repositioned    Home Living                          Prior Function            PT Goals (current goals can now be found in the care plan section) Acute Rehab PT Goals PT Goal Formulation: With patient Time For Goal Achievement: 12/23/23 Potential to Achieve  Goals: Good Progress towards PT goals: Progressing toward goals    Frequency    7X/week      PT Plan      Co-evaluation              AM-PAC PT 6 Clicks Mobility   Outcome Measure  Help needed turning from your back to your side while in a flat bed without using bedrails?: A Little Help needed moving from lying on your back to sitting on the side of a flat bed without using bedrails?: A Little Help needed moving to and from a bed to a chair (including a wheelchair)?: A Little Help needed standing up from a chair using your arms (e.g., wheelchair or bedside chair)?: A Little Help needed to walk in hospital room?: A Little Help needed climbing 3-5 steps with a railing? : Total 6 Click Score: 16    End of Session Equipment Utilized During Treatment: Gait belt;Other (comment) (L bledsoe brace) Activity Tolerance: Patient tolerated treatment well Patient left: with call bell/phone within reach;with chair alarm set;in chair Nurse Communication: Mobility status PT Visit Diagnosis: Other abnormalities of gait and mobility (R26.89)     Time: 8579-8562 PT Time Calculation (min) (ACUTE ONLY): 17 min  Charges:    $Gait Training: 8-22 mins PT General Charges $$ ACUTE PT VISIT: 1 Visit                     Sylvan Delon Copp PT 12/21/2023  Acute Rehabilitation Services  Office [phone]

## 2023-12-21 NOTE — Plan of Care (Signed)
 Id brief note   Cx with msse; not a pcr test -- possible heteroresistance    A/p Recurrent left knee pji    -dapto remains the treatment for now -- plan 8 weeks and transition to tedezolid 200 mg po daily on 02/10/24 -discussed with team -will sign off   ------------ Addendum The sent out mic testing micro from tissue is actually growing mrsa as well not identified by inhouse lab   Will send update to micro to review case and address this discrepancy

## 2023-12-21 NOTE — Telephone Encounter (Signed)
 Pharmacy Patient Advocate Encounter  Received notification from [facility] [employer] that Prior Authorization for Sivextro 200 mg Tablets has been DENIED.  No reason given; No denial letter received via Fax or CMM. It has been requested and will be uploaded to the media tab once received.   PA #/Case ID/Reference #: [id number]

## 2023-12-21 NOTE — Plan of Care (Signed)
  Problem: Education: Goal: Knowledge of General Education information will improve Description: Including pain rating scale, medication(s)/side effects and non-pharmacologic comfort measures Outcome: Progressing   Problem: Activity: Goal: Risk for activity intolerance will decrease Outcome: Progressing   Problem: Nutrition: Goal: Adequate nutrition will be maintained Outcome: Progressing   Problem: Coping: Goal: Level of anxiety will decrease Outcome: Progressing   Problem: Elimination: Goal: Will not experience complications related to urinary retention Outcome: Progressing   

## 2023-12-21 NOTE — Plan of Care (Signed)
  Problem: Safety: Goal: Ability to remain free from injury will improve Outcome: Progressing   Problem: Pain Managment: Goal: General experience of comfort will improve and/or be controlled Outcome: Progressing   Problem: Elimination: Goal: Will not experience complications related to urinary retention Outcome: Progressing   Problem: Coping: Goal: Level of anxiety will decrease Outcome: Progressing   Problem: Clinical Measurements: Goal: Ability to maintain clinical measurements within normal limits will improve Outcome: Progressing

## 2023-12-21 NOTE — Progress Notes (Signed)
 Patient ID: [name], male   DOB: [birth year], 68 y.o.   MRN: [id number] The patient is awake and alert.  His vital signs are stable.  We did change his dressing yesterday on his left knee and the dressing remains clean and dry.  He is having considerable pain with mobility.  They are working building a ramp at his house to get him in the house.  We will also put in an order for a wheelchair.  The plan is hopefully to get him home tomorrow.

## 2023-12-21 NOTE — Telephone Encounter (Signed)
 Pharmacy Patient Advocate Encounter   Received notification from Inpatient Request that prior authorization for Sivextro 200 mg tablets is required/requested.   Insurance verification completed.   The patient is insured through [facility] ADVANTAGE/RX ADVANCE.   Per test claim: PA required; PA submitted to above mentioned insurance via Latent Key/confirmation #/EOC [id number] Status is pending

## 2023-12-22 ENCOUNTER — Telehealth (HOSPITAL_COMMUNITY): Payer: Self-pay | Admitting: Pharmacy Technician

## 2023-12-22 ENCOUNTER — Encounter: Payer: Self-pay | Admitting: Pulmonary Disease

## 2023-12-22 ENCOUNTER — Other Ambulatory Visit (HOSPITAL_COMMUNITY): Payer: Self-pay

## 2023-12-22 LAB — AEROBIC/ANAEROBIC CULTURE W GRAM STAIN (SURGICAL/DEEP WOUND): Culture: NO GROWTH

## 2023-12-22 LAB — GLUCOSE, CAPILLARY
Glucose-Capillary: 72 mg/dL (ref 70–99)
Glucose-Capillary: 151 mg/dL — ABNORMAL HIGH (ref 70–99)

## 2023-12-22 MED ORDER — DAPTOMYCIN IV (FOR PTA / DISCHARGE USE ONLY): 700.0000 mg | INTRAVENOUS | 0 refills | Status: AC

## 2023-12-22 MED ORDER — HEPARIN SOD (PORK) LOCK FLUSH 100 UNIT/ML IV SOLN
250.0000 [IU] | INTRAVENOUS | Status: DC | PRN
  Administered 2023-12-22: 250 [IU]
  Filled 2023-12-22: qty 3

## 2023-12-22 MED ORDER — OXYCODONE HCL 5 MG PO TABS: 5.0000 mg | ORAL_TABLET | Freq: Four times a day (QID) | ORAL | 0 refills | Status: DC | PRN

## 2023-12-22 MED ORDER — TIZANIDINE HCL 2 MG PO TABS: 2.0000 mg | ORAL_TABLET | Freq: Four times a day (QID) | ORAL | 1 refills | Status: DC | PRN

## 2023-12-22 MED ORDER — ASPIRIN 81 MG PO TBEC: 81.0000 mg | DELAYED_RELEASE_TABLET | Freq: Two times a day (BID) | ORAL | 1 refills | Status: AC

## 2023-12-22 NOTE — Telephone Encounter (Signed)
 Patient Product/process development scientist completed.    The patient is insured through [employer] Ad[employer]age/ Rx Advance. Patient has Medicare and is not eligible for a copay card, but may be able to apply for patient assistance or Medicare RX Payment Plan (Patient Must reach out to their plan, if eligible for payment plan), if available.    Ran test claim for linezolid 600 mg and the current 30 day co-pay is $100.00.   This test claim was processed through [facility]- copay amounts may vary at other pharmacies due to pharmacy/plan contracts, or as the patient moves through the different stages of their insurance plan.     Reyes Sharps, CPHT Pharmacy Technician III Certified Patient Advocate [facility] Patient Advocate Team Direct Number: [phone]  Fax: [phone]

## 2023-12-22 NOTE — Plan of Care (Signed)
  Problem: Safety: Goal: Ability to remain free from injury will improve Outcome: Progressing   Problem: Pain Managment: Goal: General experience of comfort will improve and/or be controlled Outcome: Progressing   Problem: Elimination: Goal: Will not experience complications related to urinary retention Outcome: Progressing   Problem: Activity: Goal: Risk for activity intolerance will decrease Outcome: Progressing

## 2023-12-22 NOTE — Discharge Instructions (Signed)
 Do not put full weight through your left knee and lower extremity.   Keep the incision and the dressing clean and dry.   You may remove the knee brace occasionally when you are laying flat for comfort purposes. Change your current dressing in 5 to 6 days. You can actually shower and actually remove your dressings to shower starting next week on Monday. After you shower, do place a new dressing over your incision.

## 2023-12-22 NOTE — Progress Notes (Signed)
 Patient ID: [name], male   DOB: 04-Jan-1956, 68 y.o.   MRN: [id number] The patient is awake and alert this morning.  His vital signs are stable.  His left operative knee dressing is clean and dry and his Bledsoe knee brace is intact and in good position.  He is able to pump his foot back-and-forth on the left side.  We had a long and thorough discussion about his situation.  The plan is to discharge him home today and then we will see him back in the office and see how he is doing overall.  We certainly appreciate the infectious disease service and their input for this patient as well as nursing, therapy and the transitional care team.

## 2023-12-22 NOTE — Discharge Summary (Signed)
 Patient ID: Sheamus Hasting MRN: [id number] DOB/AGE: Aug 08, 1955 68 y.o.  Admit date: 12/16/2023 Discharge date: 12/22/2023  Admission Diagnoses:  Principal Problem:   Infection of total left knee replacement   Discharge Diagnoses:  Same  Past Medical History:  Diagnosis Date   Arthritis    Diabetes mellitus without complication (HCC) 2019   Diabetes Type II   GERD (gastroesophageal reflux disease)    diet controlled   HTN (hypertension) 10/27/2018   Hyperlipidemia    Internal hemorrhoids 2014   noted on colonoscopy   MRSA infection 05/17/2023   MSSA (methicillin susceptible Staphylococcus aureus) infection 09/03/2020   Osteoarthritis of both knees    Prostate cancer (HCC)    prostate   Sigmoid diverticulosis 2014   Mild, noted on colonoscopy   Skin lesion 01/01/2021   Staphylococcus epidermidis infection 05/17/2023   Type 2 diabetes mellitus with other specified complication (HCC) 01/01/2021   Vaccine counseling 05/04/2021    Surgeries: Procedure(s): IRRIGATION AND DEBRIDEMENT KNEE WITH POLY EXCHANGE REMOVAL, TOTAL ARTHROPLASTY HARDWARE, KNEE, WITH ANTIBIOTIC SPACER INSERTION on 12/16/2023   Consultants:   Discharged Condition: Improved  Hospital Course: Erland Vivas is an 68 y.o. male who was admitted 12/16/2023 for operative treatment ofInfection of total left knee replacement. Patient has severe unremitting pain that affects sleep, daily activities, and work/hobbies. After pre-op clearance the patient was taken to the operating room on 12/16/2023 and underwent  Procedure(s): IRRIGATION AND DEBRIDEMENT KNEE WITH POLY EXCHANGE REMOVAL, TOTAL ARTHROPLASTY HARDWARE, KNEE, WITH ANTIBIOTIC SPACER INSERTION.    Patient was given perioperative antibiotics:  Anti-infectives (From admission, onward)    Start     Dose/Rate Route Frequency Ordered Stop   12/22/23 0000  daptomycin  (CUBICIN ) IVPB        700 mg Intravenous Every 24 hours 12/22/23 0906 01/29/24 2359    12/17/23 1400  ceFEPIme (MAXIPIME) 2 g in sodium chloride  0.9 % 100 mL IVPB  Status:  Discontinued        2 g 200 mL/hr over 30 Minutes Intravenous Every 8 hours 12/17/23 1056 12/20/23 0851   12/17/23 1200  vancomycin  (VANCOREADY) IVPB 1750 mg/350 mL  Status:  Discontinued        1,750 mg 175 mL/hr over 120 Minutes Intravenous Every 24 hours 12/16/23 1806 12/17/23 1049   12/17/23 1200  DAPTOmycin  (CUBICIN ) IVPB 700 mg/170mL premix        700 mg 200 mL/hr over 30 Minutes Intravenous Daily 12/17/23 1056     12/16/23 1256  vancomycin  (VANCOCIN ) powder  Status:  Discontinued          As needed 12/16/23 1256 12/16/23 1435   12/16/23 0900  ceFAZolin  (ANCEF ) IVPB 2g/100 mL premix        2 g 200 mL/hr over 30 Minutes Intravenous On call to O.R. 12/16/23 0847 12/16/23 1222   12/16/23 0900  vancomycin  (VANCOCIN ) 1,000 mg in sodium chloride  0.9 % 250 mL IVPB        1,000 mg 250 mL/hr over 60 Minutes Intravenous On call to O.R. 12/16/23 0847 12/16/23 1318        Patient was given sequential compression devices, early ambulation, and chemoprophylaxis to prevent DVT.  Inpatient Morphine  Milligram Equivalents Per Day 9/26 - 10/2   Values displayed are in units of MME/Day    Order Start / End Date 9/26 9/27 9/28 9/29 9/30 Yesterday Today    oxyCODONE  (Oxy IR/ROXICODONE ) immediate release tablet 5 mg 9/26 - 9/26 0 of Unknown -- -- -- -- -- --  oxyCODONE  (ROXICODONE ) 5 MG/5ML solution 5 mg 9/26 - 9/26 0 of Unknown -- -- -- -- -- --      Group total: 0 of Unknown          HYDROmorphone  (DILAUDID ) injection 0.25-0.5 mg 9/26 - 9/26 40 of 40-80 -- -- -- -- -- --    fentaNYL  (SUBLIMAZE ) injection 50-100 mcg 9/26 - 9/26 30 of 15-30 -- -- -- -- -- --    HYDROmorphone  (DILAUDID ) injection 0.5-1 mg 9/26 - No end date 0 of 20-40 20 of 60-120 0 of 60-120 0 of 60-120 0 of 60-120 0 of 60-120 0 of 60-120    oxyCODONE  (Oxy IR/ROXICODONE ) immediate release tablet 5-10 mg 9/26 - No end date 0 of 15-30 30 of 45-90 0 of  45-90 0 of 45-90 0 of 45-90 15 of 45-90 15 of 45-90    oxyCODONE  (Oxy IR/ROXICODONE ) immediate release tablet 10-15 mg 9/26 - No end date 22.5 of 30-45 22.5 of 90-135 45 of 90-135 45 of 90-135 67.5 of 90-135 45 of 90-135 15 of 90-135    HYDROmorphone  (DILAUDID ) injection 0.5 mg 9/26 - 9/26 40 of 80 -- -- -- -- -- --    HYDROmorphone  (DILAUDID ) tablet 4 mg 9/26 - No end date 32 of 32 64 of 96 64 of 96 48 of 96 16 of 96 16 of 96 0 of 96    Daily Totals  164.5 of Unknown (at least 232-337) 136.5 of 291-441 109 of 291-441 93 of 291-441 83.5 of 291-441 76 of 291-441 30 of 291-441    Calculation Errors     Order Type Date Details   oxyCODONE  (Oxy IR/ROXICODONE ) immediate release tablet 5 mg Ordered Dose -- Insufficient frequency information   oxyCODONE  (ROXICODONE ) 5 MG/5ML solution 5 mg Ordered Dose -- Insufficient frequency information            Patient benefited maximally from hospital stay and there were no complications.  The patient did receive a transfusion of 2 units of packed red blood cells postoperatively secondary to acute blood loss anemia from his extensive surgery.  He worked diligently with therapy on his mobility postoperatively.  The infectious disease service was consulted and we appreciate their consultation as well as antibiotic instructions for the type and duration.  He did receive a PICC line during this hospitalization as well for long-term IV antibiotics.  By the day of discharge he was stable for discharge home.  Recent vital signs: Patient Vitals for the past 24 hrs:  BP Temp Temp src Pulse Resp SpO2  12/22/23 0454 (!) 102/56 97.7 F (36.5 C) -- (!) 58 18 93 %  12/21/23 2221 124/61 98.5 F (36.9 C) Oral 65 18 98 %  12/21/23 1259 (!) 144/73 98.5 F (36.9 C) Oral 66 15 98 %     Recent laboratory studies:  Recent Labs    12/20/23 0424  WBC 5.3  HGB 10.1*  HCT 31.2*  PLT 181     Discharge Medications:   Allergies as of 12/22/2023       Reactions    Rifampin  Nausea Only, Other (See Comments)   Skin crawling- bodyache        Medication List     STOP taking these medications    sulfamethoxazole -trimethoprim  800-160 MG tablet Commonly known as: BACTRIM  DS       TAKE these medications    aspirin  EC 81 MG tablet Take 1 tablet (81 mg total) by mouth 2 (two) times daily. Swallow  whole.   daptomycin  IVPB Commonly known as: CUBICIN  Inject 700 mg into the vein daily. Indication:  PJI First Dose: Yes Last Day of Therapy:  01/27/24 Labs - Once weekly:  CBC/D, BMP, and CPK Labs - Once weekly: ESR and CRP Method of administration: IV Push Method of administration may be changed at the discretion of home infusion pharmacist based upon assessment of the patient and/or caregiver's ability to self-administer the medication ordered.   escitalopram  20 MG tablet Commonly known as: LEXAPRO  Take 20 mg by mouth daily.   FreeStyle Libre 14 Day Sensor Misc SMARTSIG:Topical Every 10 Days   insulin  aspart 100 UNIT/ML injection Commonly known as: novoLOG  Inject 20 Units into the skin 3 (three) times daily with meals. Per sliding scale   losartan -hydrochlorothiazide  50-12.5 MG tablet Commonly known as: HYZAAR TAKE (1) TABLET BY MOUTH ONCE DAILY.   oxyCODONE  5 MG immediate release tablet Commonly known as: Oxy IR/ROXICODONE  Take 1-2 tablets (5-10 mg total) by mouth every 6 (six) hours as needed for severe pain (pain score 7-10). What changed: how much to take   rosuvastatin  20 MG tablet Commonly known as: CRESTOR  TAKE 1 TABLET BY MOUTH EVERY DAY   tiZANidine  2 MG tablet Commonly known as: ZANAFLEX  Take 1 tablet (2 mg total) by mouth every 6 (six) hours as needed.   Tresiba  FlexTouch 200 UNIT/ML FlexTouch Pen Generic drug: insulin  degludec Inject 90 Units into the skin daily.   zolpidem  10 MG tablet Commonly known as: AMBIEN  Take 10 mg by mouth at bedtime as needed for sleep.               Durable Medical Equipment   (From admission, onward)           Start     Ordered   12/21/23 0714  For home use only DME standard manual wheelchair with seat cushion  Once       Comments: Patient suffers from an infected left total knee which impairs their ability to perform daily activities like bathing, dressing, grooming, and toileting in the home.  A walker will not resolve issue with performing activities of daily living. A wheelchair will allow patient to safely perform daily activities. Patient can safely propel the wheelchair in the home or has a caregiver who can provide assistance. Length of need 6 months . Accessories: elevating leg rests (ELRs), wheel locks, extensions and anti-tippers.   12/21/23 0713   12/16/23 1748  DME 3 n 1  Once        12/16/23 1747   12/16/23 1748  DME Walker rolling  Once       Question Answer Comment  Walker: With 5 Inch Wheels   Patient needs a walker to treat with the following condition Infection of total left knee replacement      12/16/23 1747              Discharge Care Instructions  (From admission, onward)           Start     Ordered   12/22/23 0000  Change dressing on IV access line weekly and PRN  (Home infusion instructions - [facility] )        12/22/23 0906            Diagnostic Studies: DG CHEST PORT 1 VIEW Result Date: 12/20/2023 CLINICAL DATA:  PICC line placement. EXAM: PORTABLE CHEST 1 VIEW COMPARISON:  None Available. FINDINGS: Right-sided PICC line has tip over the SVC at the  level of the carina. Lungs are somewhat hypoinflated and otherwise clear. Cardiomediastinal silhouette is normal. Degenerative change of the spine. IMPRESSION: 1. No acute cardiopulmonary disease. 2. Right-sided PICC line with tip over the SVC. Electronically Signed   By: Toribio Agreste M.D.   On: 12/20/2023 10:29   US  EKG SITE RITE Result Date: 12/17/2023 If Site Rite image not attached, placement could not be confirmed due to current cardiac  rhythm.  DG Knee Left Port Result Date: 12/16/2023 CLINICAL DATA:  Postoperative left knee EXAM: PORTABLE LEFT KNEE - 1-2 VIEW COMPARISON:  11/02/2023 FINDINGS: There has been interval removal of left knee arthroplasty components. The left knee and intramedullary surgical defects have been filled with cement. A patellar femoral component has been removed. There is medial displacement of the tibia with respect to the distal femur. Anatomic alignment is suggested on the lateral view. There is a moderate effusion. Soft tissue gas consistent with recent surgery. IMPRESSION: Removal of previous left knee arthroplasty. Fusion of the left knee and filling of the postoperative space with cement. Some medial displacement of the tibia with respect to the femur is identified. Left knee effusion and soft tissue gas are likely postoperative. Electronically Signed   By: Elsie Gravely M.D.   On: 12/16/2023 19:45    Disposition: Discharge disposition: 01-Home or Self Care       Discharge Instructions     [facility] pharmacist to adjust dose for Vancomycin , Aminoglycosides and other anti-infective therapies as requested by physician.   Complete by: As directed    [facility] to provide Cath Flo 2mg    Complete by: As directed    Administer for PICC line occlusion and as ordered by physician for other access device issues.   Anaphylaxis Kit: Provided to treat any anaphylactic reaction to the medication being provided to the patient if First Dose or when requested by physician   Complete by: As directed    Epinephrine  1mg /ml vial / amp: Administer 0.3mg  (0.5ml) subcutaneously once for moderate to severe anaphylaxis, nurse to call physician and pharmacy when reaction occurs and call 911 if needed for immediate care   Diphenhydramine  50mg /ml IV vial: Administer 25-50mg  IV/IM PRN for first dose reaction, rash, itching, mild reaction, nurse to call physician and pharmacy when reaction occurs    Sodium Chloride  0.9% NS 500ml IV: Administer if needed for hypovolemic blood pressure drop or as ordered by physician after call to physician with anaphylactic reaction   Change dressing on IV access line weekly and PRN   Complete by: As directed    Flush IV access with Sodium Chloride  0.9% and Heparin  10 units/ml or 100 units/ml   Complete by: As directed    Home infusion instructions - [facility]   Complete by: As directed    Instructions: Flush IV access with Sodium Chloride  0.9% and Heparin  10units/ml or 100units/ml   Change dressing on IV access line: Weekly and PRN   Instructions Cath Flo 2mg : Administer for PICC Line occlusion and as ordered by physician for other access device   [facility] pharmacist to adjust dose for: Vancomycin , Aminoglycosides and other anti-infective therapies as requested by physician   Method of administration may be changed at the discretion of home infusion pharmacist based upon assessment of the patient and/or caregiver's ability to self-administer the medication ordered   Complete by: As directed         Follow-up Information     [facility] Follow  up.   Specialty: Home Health Services Why: Home Health Physical Therapy   Home Health Skilled Nursing (RN) Contact information: [address] [place] [address] [phone]         Vernetta Lonni GRADE, MD Follow up in 2 week(s).   Specialty: Orthopedic Surgery Contact information: [address] [place]  [place] [address] [phone]                  Signed: Lonni GRADE Vernetta 12/22/2023, 9:06 AM

## 2023-12-22 NOTE — Progress Notes (Signed)
 Physical Therapy Treatment Patient Details Name: [name] MRN: [id number] DOB: 05-Apr-1955 Today's Date: 12/22/2023   History of Present Illness Patient is a 68 year old male s/p Left knee excision arthroplasty with removal of all components and placement of an antibiotic spacer; arthrotomy with synovectomy all compartments left knee on 12/16/23. PMHx includes DM II, HTN, LLE edema, multiple L knee surgeries--LTKAs, L knee abx spacer, multiple I&Ds    PT Comments  Pt reports there is not a ramp put up at his home yet. Attempted stair training to simulate 3 steps to enter his home. Pt ascended/descended 2 stairs with RW, no rails, with min assist of 2 for balance, max verbal cues for technique and assist to manage RW, pt did not maintain TDWB status on stairs. Pt reports he does not feel safe nor confident with stairs.  Options for getting into home would be PTAR transport or 2 person assist to bump up 3 stairs in a wheelchair. Pt/spouse report they have a WC and will check with friends about assisting pt into home.    If plan is discharge home, recommend the following: Help with stairs or ramp for entrance;Assist for transportation;Assistance with cooking/housework;A little help with bathing/dressing/bathroom;A little help with walking and/or transfers   Can travel by private Theme park manager (measurements PT)    Recommendations for Other Services       Precautions / Restrictions Precautions Precautions: Fall Recall of Precautions/Restrictions: Intact Precaution/Restrictions Comments: PICC line RUE Required Braces or Orthoses: Other Brace Knee Immobilizer - Left: On at all times Other Brace: Bledsoe brace locked at 30 degrees Restrictions Weight Bearing Restrictions Per Provider Order: Yes LLE Weight Bearing Per Provider Order: Non weight bearing Other Position/Activity Restrictions: per Dr. Vernetta ok to TDWB     Mobility  Bed  Mobility Overal bed mobility: Needs Assistance Bed Mobility: Supine to Sit, Sit to Supine     Supine to sit: Contact guard Sit to supine: Min assist   General bed mobility comments: guarded LLE; pt  self assisted LLE with RLE for OOB    Transfers Overall transfer level: Needs assistance Equipment used: Rolling walker (2 wheels) Transfers: Sit to/from Stand Sit to Stand: Contact guard assist, From elevated surface           General transfer comment: cues for LLE position and hand placement. pt able to control descent - CGA to guide LLE fwd    Ambulation/Gait Ambulation/Gait assistance: Contact guard assist Gait Distance (Feet): 34 Feet Assistive device: Rolling walker (2 wheels) Gait Pattern/deviations: Step-to pattern Gait velocity: decr     General Gait Details: cues for sequence, RW position,  step length, and TDWB  LLE; distance limited by pain   Stairs Stairs: Yes Stairs assistance: Min assist, +2 physical assistance, +2 safety/equipment Stair Management: No rails, Backwards, With walker Number of Stairs: 2 General stair comments: Max VCs for technique, min A for balance and to steady RW, pt not able to maintain TDWB status LLE; pt able to ascend/descend 2 stairs but stated he didn't feel steady   Wheelchair Mobility     Tilt Bed    Modified Rankin (Stroke Patients Only)       Balance Overall balance assessment: Needs assistance Sitting-balance support: No upper extremity supported, Feet supported Sitting balance-Leahy Scale: Good     Standing balance support: Bilateral upper extremity supported Standing balance-Leahy Scale: Poor  Communication Communication Communication: No apparent difficulties  Cognition Arousal: Alert Behavior During Therapy: WFL for tasks assessed/performed   PT - Cognitive impairments: No apparent impairments                         Following commands: Intact       Cueing Cueing Techniques: Verbal cues  Exercises      General Comments        Pertinent Vitals/Pain Pain Assessment Pain Score: 8  Pain Location: L knee with walking Pain Descriptors / Indicators: Sore, Grimacing, Guarding, Operative site guarding Pain Intervention(s): Limited activity within patient's tolerance, Monitored during session, Premedicated before session, Repositioned, Ice applied    Home Living                          Prior Function            PT Goals (current goals can now be found in the care plan section) Acute Rehab PT Goals PT Goal Formulation: With patient/family Time For Goal Achievement: 12/23/23 Potential to Achieve Goals: Good Progress towards PT goals: Progressing toward goals    Frequency    7X/week      PT Plan      Co-evaluation              AM-PAC PT 6 Clicks Mobility   Outcome Measure  Help needed turning from your back to your side while in a flat bed without using bedrails?: A Little Help needed moving from lying on your back to sitting on the side of a flat bed without using bedrails?: A Little Help needed moving to and from a bed to a chair (including a wheelchair)?: A Little Help needed standing up from a chair using your arms (e.g., wheelchair or bedside chair)?: A Little Help needed to walk in hospital room?: None Help needed climbing 3-5 steps with a railing? : A Lot 6 Click Score: 18    End of Session Equipment Utilized During Treatment: Gait belt;Other (comment) (bledsoe brace LLE) Activity Tolerance: Patient tolerated treatment well Patient left: in bed;with family/visitor present;with bed alarm set Nurse Communication: Mobility status PT Visit Diagnosis: Other abnormalities of gait and mobility (R26.89)     Time: 8851-8783 PT Time Calculation (min) (ACUTE ONLY): 28 min  Charges:    $Gait Training: 8-22 mins $Therapeutic Activity: 8-22 mins PT General Charges $$ ACUTE PT VISIT: 1  Visit                     Sylvan Delon Copp PT 12/22/2023  Acute Rehabilitation Services  Office [phone]

## 2023-12-22 NOTE — TOC Transition Note (Signed)
 Transition of Care [facility]) - Discharge Note   Patient Details  Name: [name] MRN: [id number] Date of Birth: November 27, 1955  Transition of [facility]) CM/SW Contact:  Alfonse JONELLE Rex, RN Phone Number: 12/22/2023, 10:43 AM   Clinical Narrative: DC to home. [facility] for iv abx, Hh RN-[facility]. No further INPT Care Mgmt needs identified at this time.       Final next level of care: Home w Home Health Services Barriers to Discharge: Barriers Resolved   Patient Goals and CMS Choice Patient states their goals for this hospitalization and ongoing recovery are:: return home CMS Medicare.gov Compare Post Acute Care list provided to:: Patient Represenative (must comment) Choice offered to / list presented to : Patient [place] ownership interest in [facility].provided to:: Patient    Discharge Placement                       Discharge Plan and Services Additional resources added to the After Visit Summary for   In-house Referral: NA Discharge Planning Services: NA            DME Arranged: N/A DME Agency: NA       HH Arranged: RN HH Agency: [facility]        Social Drivers of Health (SDOH) Interventions SDOH Screenings   Food Insecurity: No Food Insecurity (12/16/2023)  Housing: Low Risk  (12/16/2023)  Transportation Needs: No Transportation Needs (12/16/2023)  Utilities: Not At Risk (12/16/2023)  Depression (PHQ2-9): Low Risk  (05/23/2023)  Financial Resource Strain: Low Risk  (11/12/2021)  Social Connections: Socially Integrated (12/16/2023)  Stress: No Stress Concern Present (09/01/2020)  Tobacco Use: Low Risk  (12/16/2023)     Readmission Risk Interventions    12/22/2023   10:42 AM 03/30/2023    1:06 PM  Readmission Risk Prevention Plan  Post Dischage Appt Complete Complete  Medication Screening Complete Complete  Transportation Screening Complete Complete

## 2023-12-22 NOTE — Progress Notes (Signed)
 Discharge instructions discussed with patient and family, verbalized agreement and understanding

## 2023-12-27 ENCOUNTER — Other Ambulatory Visit: Payer: Self-pay | Admitting: Orthopaedic Surgery

## 2023-12-27 ENCOUNTER — Telehealth: Payer: Self-pay | Admitting: Orthopaedic Surgery

## 2023-12-27 LAB — MIC RESULT

## 2023-12-27 LAB — MINIMUM INHIBITORY CONC. (1 DRUG)

## 2023-12-27 NOTE — Telephone Encounter (Signed)
 Pt's wife called asking for a call back with medical advice about severe pains. Please call Diane at [phone].

## 2023-12-29 ENCOUNTER — Ambulatory Visit: Admitting: Orthopaedic Surgery

## 2023-12-29 ENCOUNTER — Encounter: Payer: Self-pay | Admitting: Orthopaedic Surgery

## 2023-12-29 DIAGNOSIS — T8454XD Infection and inflammatory reaction due to internal left knee prosthesis, subsequent encounter: Secondary | ICD-10-CM

## 2023-12-29 MED ORDER — OXYCODONE HCL 5 MG PO TABS: 5.0000 mg | ORAL_TABLET | Freq: Four times a day (QID) | ORAL | 0 refills | Status: DC | PRN

## 2023-12-29 NOTE — Progress Notes (Signed)
 The patient is here today for his first postoperative visit 2 weeks after an excision arthroplasty of his infected left knee revision replacement.  All components were removed and a large amount of cement was placed within the knee joint to try to maintain the space.  He also has a Bledsoe knee brace locked at 30 degrees of extension.  He is on IV antibiotics and is followed by the infectious disease service.  Obviously this surgery has caused a significant mount of pain postoperatively given the inherent instability of that knee.  He does state that today he is tolerating the pain slightly better.  On exam his calf is soft.  His knee swelling is down and there is no redness.  The sutures were removed and Steri-Strips applied.  We talked about the future of his knee and the potential for some type of fusion if he is able to tolerate the pain and wait for 3 to 6 months.  The other option would be sending him to my partner Dr. Harden to consider an above-knee amputation.  As of now, the patient wants to still pursue the course that we are heading in in terms of trying to salvage the leg.  Will see him back in 4 weeks to see how he is doing overall.  We will defer antibiotic treatment and duration to the infectious disease service.  We will continue his narcotic pain medicines for a while.  He does understand that he can take 4 over-the-counter Advil twice daily on a full stomach as well.

## 2023-12-30 ENCOUNTER — Telehealth: Payer: Self-pay | Admitting: Orthopaedic Surgery

## 2023-12-30 NOTE — Telephone Encounter (Signed)
 Patient called and said Dr. Vernetta ordered PSA test for another Doctor and he wants to know if Dr. Renda has the results. CB#[phone] or [phone]

## 2024-01-05 ENCOUNTER — Other Ambulatory Visit: Payer: Self-pay | Admitting: Orthopaedic Surgery

## 2024-01-09 ENCOUNTER — Other Ambulatory Visit (HOSPITAL_COMMUNITY): Payer: Self-pay | Admitting: Urology

## 2024-01-09 ENCOUNTER — Telehealth: Payer: Self-pay

## 2024-01-09 DIAGNOSIS — C61 Malignant neoplasm of prostate: Secondary | ICD-10-CM

## 2024-01-09 NOTE — Telephone Encounter (Signed)
 Elizabeth,RN with [facility] left a voicemail to make sure labs was received on this patient ESR and CRP was elevated. Her call back is [phone].   Orvill Coulthard SHAUNNA Letters, CMA

## 2024-01-09 NOTE — Telephone Encounter (Signed)
 Yes received labs on 10/10 with ESR 50 and CRP 34. I know historically Dr. Fleeta Rothman has mentioned they are slightly elevated but stable....unsure what those labs were though.

## 2024-01-11 ENCOUNTER — Other Ambulatory Visit: Payer: Self-pay | Admitting: Orthopaedic Surgery

## 2024-01-16 ENCOUNTER — Encounter (HOSPITAL_COMMUNITY)
Admission: RE | Admit: 2024-01-16 | Discharge: 2024-01-16 | Disposition: A | Discharge status: Home / Self Care | Source: Ambulatory Visit | Attending: Urology | Admitting: Urology

## 2024-01-16 DIAGNOSIS — C61 Malignant neoplasm of prostate: Secondary | ICD-10-CM | POA: Diagnosis present

## 2024-01-16 MED ORDER — FLOTUFOLASTAT F 18 GALLIUM 296-5846 MBQ/ML IV SOLN
8.3060 | Freq: Once | INTRAVENOUS | Status: AC
  Administered 2024-01-16: 8.306 via INTRAVENOUS

## 2024-01-17 ENCOUNTER — Other Ambulatory Visit: Payer: Self-pay

## 2024-01-17 ENCOUNTER — Ambulatory Visit: Payer: Self-pay | Admitting: Internal Medicine

## 2024-01-17 ENCOUNTER — Encounter: Payer: Self-pay | Admitting: Internal Medicine

## 2024-01-17 VITALS — BP 94/58 | HR 72 | Wt 195.0 lb

## 2024-01-17 DIAGNOSIS — T8450XD Infection and inflammatory reaction due to unspecified internal joint prosthesis, subsequent encounter: Secondary | ICD-10-CM | POA: Diagnosis not present

## 2024-01-17 NOTE — Progress Notes (Signed)
 Subjective:  Chief complaint: follow-up for chronic, recurrent prosthetic knee infection   Patient ID: [name], male    DOB: [birth year], 68 y.o.   MRN: [id number]  HPI   68 yo male dm2, hx prostate cancer, left lower chronic lymphedema, gerd, htn/hlp, index left TKA 02/2019 with recurrent knee pji admitted here for same despite being on suppressive bactrim , complicated by another pji, here for hospital follow up   Time course: 07/2020 initial left knee pji s/p dair; cx mssa; s/p 6 weeks cefazolin  and couldn't tolerate rifampin , followed by suppressive cefadroxil  04/2021 recurrent left knee pji despite on abx; s/p removal of hardware and abx spacer; cx mrsa; vanc--> dapto course of 05/21/21-07/02/21; underwent new knee placement 12/16/21 recurrent swelling/pain aspiration cx negative (although was getting doxy empirically); and ultimately underwent on 03/25/2023 I&D with polyliner exchange; cx mrse. Initial 3 weeks rifampin  but couldn't tolerate so stopped; transitioned to bactrim  04/28/23 and continued on it 11/04/23 Despite being on bactrim  continue to have swelling/pain and was even seen at [facility] for second opinion and underwent I&D on this date, sent out on high dose bactrim . No cultures were done   12/16/23 readmitted for ongoing sx with decision to remove all hardware and placed abx spacer. Possible aka if unable to rid of infection  He has been taking bactrim  prior to surgery   Cx finalized mrse (intermediate susceptibility to tetra and r bactrim )   Explained to him again we are sticking to 3 month tx planned and will keep on dapto for as long as possible before transitioning to linezolid vs tedezolid (8 and 4 weeks)   -------------- 01/17/24 id clinic f/u Patient discharged from hospital 12/22/23 Tolerating daptomycin  no cough, muscle pain I reviewed daptomycin  susceptibility and confirmed again MRSE is susceptible (mic </=1) Reports having dreams where he was  falling for the last few days that made him jerk Sutures removed 3 weeks ago The incision closed No diarrhea, n/v Rue picc site no issue               Past Medical History:  Diagnosis Date   Arthritis    Diabetes mellitus without complication (HCC) 2019   Diabetes Type II   GERD (gastroesophageal reflux disease)    diet controlled   HTN (hypertension) 10/27/2018   Hyperlipidemia    Internal hemorrhoids 2014   noted on colonoscopy   MRSA infection 05/17/2023   MSSA (methicillin susceptible Staphylococcus aureus) infection 09/03/2020   Osteoarthritis of both knees    Prostate cancer (HCC)    prostate   Sigmoid diverticulosis 2014   Mild, noted on colonoscopy   Skin lesion 01/01/2021   Staphylococcus epidermidis infection 05/17/2023   Type 2 diabetes mellitus with other specified complication (HCC) 01/01/2021   Vaccine counseling 05/04/2021    Past Surgical History:  Procedure Laterality Date   CARPAL TUNNEL RELEASE Right 02/22/2019   Procedure: CARPAL TUNNEL RELEASE;  Surgeon: Margrette Taft BRAVO, MD;  Location: [facility];  Service: Orthopedics;  Laterality: Right;   COLONOSCOPY N/A 09/13/2012   Procedure: COLONOSCOPY;  Surgeon: Margo LITTIE Haddock, MD;  Location: [facility];  Service: Endoscopy;  Laterality: N/A;  9:30 AM   CYSTOSCOPY WITH URETHRAL DILATATION N/A 06/02/2020   Procedure: CYSTOSCOPY WITH URETHRAL DILATATION;  Surgeon: Renda Glance, MD;  Location: [facility];  Service: Urology;  Laterality: N/A;  ONLY NEEDS 30 MIN   EXCISIONAL TOTAL KNEE ARTHROPLASTY WITH ANTIBIOTIC SPACERS Left 05/15/2021   Procedure: LEFT TOTAL KNEE IRRIGATION  AND DEBRIDEMENT, EXCISION LEFT TOTAL KNEE ARTHROPLASTY WITH ANTIBIOTIC SPACERS;  Surgeon: Vernetta Lonni GRADE, MD;  Location: [facility];  Service: Orthopedics;  Laterality: Left;   EXCISIONAL TOTAL KNEE ARTHROPLASTY WITH ANTIBIOTIC SPACERS Left 12/16/2023   Procedure: REMOVAL, TOTAL ARTHROPLASTY HARDWARE, KNEE, WITH ANTIBIOTIC SPACER  INSERTION;  Surgeon: Vernetta Lonni GRADE, MD;  Location: [facility];  Service: Orthopedics;  Laterality: Left;   HERNIA REPAIR     20 years ago   I & D KNEE WITH POLY EXCHANGE Left 08/14/2020   Procedure: IRRIGATION AND DEBRIDEMENT LEFT KNEE with synovectomy WITH POLY EXCHANGE;  Surgeon: Vernetta Lonni GRADE, MD;  Location: [facility];  Service: Orthopedics;  Laterality: Left;   I & D KNEE WITH POLY EXCHANGE Left 03/25/2023   Procedure: INCISION AND DRAINAGE LEFT KNEE, SYNOVECTOMY,  POLY-LINER EXCHANGE;  Surgeon: Vernetta Lonni GRADE, MD;  Location: [facility];  Service: Orthopedics;  Laterality: Left;   I & D KNEE WITH POLY EXCHANGE Left 12/16/2023   Procedure: IRRIGATION AND DEBRIDEMENT KNEE WITH POLY EXCHANGE;  Surgeon: Vernetta Lonni GRADE, MD;  Location: [facility];  Service: Orthopedics;  Laterality: Left;  INCISION AND DRAINAGE LEFT KNEE, POSSIBLE POLY EXCHANGE, POSSIBLE EXCISION OF COMPONENTS AND  PLACEMENT ANTIBIOTIC SPACER   IRRIGATION AND DEBRIDEMENT KNEE Left 11/04/2023   Procedure: IRRIGATION AND DEBRIDEMENT KNEE;  Surgeon: Vernetta Lonni GRADE, MD;  Location: [facility];  Service: Orthopedics;  Laterality: Left;  INCISION AND DRAINAGE LEFT KNEE   KNEE ARTHROSCOPY Left    15 years ago   LYMPHADENECTOMY Bilateral 05/30/2017   Procedure: LYMPHADENECTOMY, PELVIC EXTENDED;  Surgeon: Renda Glance, MD;  Location: [facility];  Service: Urology;  Laterality: Bilateral;   ROBOT ASSISTED LAPAROSCOPIC RADICAL PROSTATECTOMY N/A 05/30/2017   Procedure: XI ROBOTIC ASSISTED LAPAROSCOPIC RADICAL PROSTATECTOMY LEVEL 3;  Surgeon: Renda Glance, MD;  Location: [facility];  Service: Urology;  Laterality: N/A;   SHOULDER ARTHROSCOPY Right    15 years ago   TOTAL KNEE ARTHROPLASTY Left 03/21/2019   Procedure: LEFT TOTAL KNEE ARTHROPLASTY;  Surgeon: Vernetta Lonni GRADE, MD;  Location: [facility];  Service: Orthopedics;  Laterality: Left;   TOTAL KNEE REVISION Left 08/14/2021   Procedure: REMOVAL OF ANTIBIOTIC SPACER AND  LEFT TOTAL KNEE REVISION ARTHROPLASTY;  Surgeon: Vernetta Lonni GRADE, MD;  Location: [facility];  Service: Orthopedics;  Laterality: Left;    Family History  Problem Relation Age of Onset   Cancer Mother        small cell lung      Social History   Socioeconomic History   Marital status: Married    Spouse name: Not on file   Number of children: 1   Years of education: Not on file   Highest education level: Not on file  Occupational History   Occupation: pastor  Tobacco Use   Smoking status: Never   Smokeless tobacco: Never  Vaping Use   Vaping status: Never Used  Substance and Sexual Activity   Alcohol use: No   Drug use: No   Sexual activity: Yes  Other Topics Concern   Not on file  Social History Narrative   Juliene   Married to [employer]   Daughter, Abby Rory Burkes, RN III, Resides in [place]   Social Drivers of Health   Financial Resource Strain: Low Risk  (11/12/2021)   Overall Financial Resource Strain (CARDIA)    Difficulty of Paying Living Expenses: Not hard at all  Food Insecurity: No Food Insecurity (12/16/2023)   Hunger Vital Sign  Worried About Programme Researcher, Broadcasting/film/video in the Last Year: Never true    Ran Out of Food in the Last Year: Never true  Transportation Needs: No Transportation Needs (12/16/2023)   PRAPARE - Administrator, Civil Service (Medical): No    Lack of Transportation (Non-Medical): No  Physical Activity: Not on file  Stress: No Stress Concern Present (09/01/2020)   [employer] of Occupational Health - Occupational Stress Questionnaire    Feeling of Stress : Not at all  Social Connections: Socially Integrated (12/16/2023)   Social Connection and Isolation Panel    Frequency of Communication with Friends and Family: More than three times a week    Frequency of Social Gatherings with Friends and Family: More than three times a week    Attends Religious Services: More than 4 times per year    Active Member of [employer] or  Organizations: Yes    Attends Engineer, Structural: More than 4 times per year    Marital Status: Married    Allergies  Allergen Reactions   Rifampin  Nausea Only and Other (See Comments)    Skin crawling- bodyache     Current Outpatient Medications:    aspirin  EC 81 MG tablet, Take 1 tablet (81 mg total) by mouth 2 (two) times daily. Swallow whole., Disp: 30 tablet, Rfl: 1   Continuous Blood Gluc Sensor (FREESTYLE LIBRE 14 DAY SENSOR) MISC, SMARTSIG:Topical Every 10 Days, Disp: , Rfl:    daptomycin  (CUBICIN ) IVPB, Inject 700 mg into the vein daily. Indication:  PJI First Dose: Yes Last Day of Therapy:  01/27/24 Labs - Once weekly:  CBC/D, BMP, and CPK Labs - Once weekly: ESR and CRP Method of administration: IV Push Method of administration may be changed at the discretion of home infusion pharmacist based upon assessment of the patient and/or caregiver's ability to self-administer the medication ordered., Disp: 38 Units, Rfl: 0   escitalopram  (LEXAPRO ) 20 MG tablet, Take 20 mg by mouth daily., Disp: , Rfl:    insulin  aspart (NOVOLOG ) 100 UNIT/ML injection, Inject 20 Units into the skin 3 (three) times daily with meals. Per sliding scale, Disp: , Rfl:    losartan -hydrochlorothiazide  (HYZAAR) 50-12.5 MG tablet, TAKE (1) TABLET BY MOUTH ONCE DAILY., Disp: 30 tablet, Rfl: 0   oxyCODONE  (OXY IR/ROXICODONE ) 5 MG immediate release tablet, Take 1 tablet (5 mg total) by mouth every 6 (six) hours as needed for severe pain (pain score 7-10)., Disp: 30 tablet, Rfl: 0   rosuvastatin  (CRESTOR ) 20 MG tablet, TAKE 1 TABLET BY MOUTH EVERY DAY, Disp: 90 tablet, Rfl: 1   tiZANidine  (ZANAFLEX ) 2 MG tablet, Take 1 tablet (2 mg total) by mouth every 6 (six) hours as needed., Disp: 30 tablet, Rfl: 1   TRESIBA  FLEXTOUCH 200 UNIT/ML FlexTouch Pen, Inject 90 Units into the skin daily., Disp: , Rfl:    zolpidem  (AMBIEN ) 10 MG tablet, Take 10 mg by mouth at bedtime as needed for sleep., Disp: , Rfl:   Current  Facility-Administered Medications:    alteplase  (ACTIVASE ) injection 2 mg, 2 mg, Intravenous, Once,    Review of Systems  Constitutional:  Negative for activity change, appetite change, chills, diaphoresis, fatigue, fever and unexpected weight change.  HENT:  Negative for congestion, rhinorrhea, sinus pressure, sneezing, sore throat and trouble swallowing.   Eyes:  Negative for photophobia and visual disturbance.  Respiratory:  Negative for cough, chest tightness, shortness of breath, wheezing and stridor.   Cardiovascular:  Negative for chest pain,  palpitations and leg swelling.  Gastrointestinal:  Negative for abdominal distention, abdominal pain, anal bleeding, blood in stool, constipation, diarrhea, nausea and vomiting.  Genitourinary:  Negative for difficulty urinating, dysuria, flank pain and hematuria.  Musculoskeletal:  Positive for arthralgias. Negative for back pain, gait problem, joint swelling and myalgias.  Skin:  Negative for color change, pallor, rash and wound.  Neurological:  Negative for dizziness, tremors, weakness and light-headedness.  Hematological:  Negative for adenopathy. Does not bruise/bleed easily.  Psychiatric/Behavioral:  Negative for agitation, behavioral problems, confusion, decreased concentration, dysphoric mood and sleep disturbance.        Objective:   Physical Exam Constitutional:      Appearance: He is well-developed.  HENT:     Head: Normocephalic and atraumatic.  Eyes:     Conjunctiva/sclera: Conjunctivae normal.  Cardiovascular:     Rate and Rhythm: Normal rate and regular rhythm.  Pulmonary:     Effort: Pulmonary effort is normal. No respiratory distress.     Breath sounds: No wheezing.  Abdominal:     General: There is no distension.     Palpations: Abdomen is soft.  Musculoskeletal:        General: No tenderness. Normal range of motion.     Cervical back: Normal range of motion and neck supple.  Skin:    General: Skin is warm and  dry.     Coloration: Skin is not pale.     Findings: No erythema or rash.  Neurological:     General: No focal deficit present.     Mental Status: He is alert and oriented to person, place, and time.  Psychiatric:        Mood and Affect: Mood normal.        Behavior: Behavior normal.        Thought Content: Thought content normal.        Judgment: Judgment normal.    Line -rue picc no erythema/purulence  Labs: Opat labs 01/02/24  Cr 0.85; cbc 5/10/219 Crp 29 (<10) Sed rate 53 01/09/24 Sed rate 43; crp 17 (<10) Cbc 5/10/194 Cr 0.9  No cpk's done     Assessment & Plan:   Assessment and Plan          Problem List Items Addressed This Visit   None Visit Diagnoses       Infection of prosthetic joint, subsequent encounter    -  Primary        68 yo male dm2, hx prostate cancer, left lower chronic lymphedema, gerd, htn/hlp, index left TKA 02/2019 with recurrent knee pji admitted here for same despite being on suppressive bactrim , complicated by another pji, here for hospital follow up   Time course: 07/2020 initial left knee pji s/p dair; cx mssa; s/p 6 weeks cefazolin  and couldn't tolerate rifampin , followed by suppressive cefadroxil  04/2021 recurrent left knee pji despite on abx; s/p removal of hardware and abx spacer; cx mrsa; vanc--> dapto course of 05/21/21-07/02/21; underwent new knee placement 12/16/21 recurrent swelling/pain aspiration cx negative (although was getting doxy empirically); and ultimately underwent on 03/25/2023 I&D with polyliner exchange; cx mrse. Initial 3 weeks rifampin  but couldn't tolerate so stopped; transitioned to bactrim  04/28/23 and continued on it 11/04/23 Despite being on bactrim  continue to have swelling/pain and was even seen at [facility] for second opinion and underwent I&D on this date, sent out on high dose bactrim . No cultures were done   12/16/23 readmitted for ongoing sx with decision to remove all hardware  and placed abx  spacer. Possible aka if unable to rid of infection  He has been taking bactrim  prior to surgery   Cx finalized mrse (intermediate susceptibility to tetra and r bactrim )   Explained to him again we are sticking to 3 month tx planned and will keep on dapto for as long as possible before transitioning to linezolid vs tedezolid (8 and 4 weeks)  ---------- 01/17/24 id assessment Clinicaly left knee pain improving Inflammatory numbers improving Picc line no issue  Will ask hh to get cpk Eot planned for dapto is 11/21 then 4 more weeks linezolid or tedezolid  Goal is definitive cure now that he has all hardare removed   Picc to be removed on 11/21's  Start linezolid 600 mg po twice a day there after  See me 5-6 weeks  Advise to discuss with pcp/hematologist to see if new medication nubeqa can cause weird dreams     Follow up: Return in about 6 years (around 01/16/2030).

## 2024-01-17 NOTE — Patient Instructions (Signed)
 Your home health will need to continue drawing weekly cbc, cmp, crp, esr, and cpk   The cpk hasn't been drawn and will let them know   Picc to be removed on 11/21  On 11/22 start linezolid 600 mg twice a day   See me in 5-6 weeks

## 2024-01-19 ENCOUNTER — Other Ambulatory Visit: Payer: Self-pay | Admitting: Orthopaedic Surgery

## 2024-01-23 ENCOUNTER — Encounter: Payer: Self-pay | Admitting: Radiology

## 2024-02-01 ENCOUNTER — Ambulatory Visit (INDEPENDENT_AMBULATORY_CARE_PROVIDER_SITE_OTHER): Admitting: Orthopaedic Surgery

## 2024-02-01 ENCOUNTER — Encounter: Payer: Self-pay | Admitting: Orthopaedic Surgery

## 2024-02-01 DIAGNOSIS — T8454XD Infection and inflammatory reaction due to internal left knee prosthesis, subsequent encounter: Secondary | ICD-10-CM

## 2024-02-01 MED ORDER — OXYCODONE HCL 5 MG PO TABS: 5.0000 mg | ORAL_TABLET | Freq: Two times a day (BID) | ORAL | 0 refills | Status: DC | PRN

## 2024-02-01 NOTE — Progress Notes (Signed)
 The patient is now 7 weeks status post an excision arthroplasty of a chronically infected left total knee replacement.  He now just has a large amount of biologic bone cement with antibiotics in the need to maintain the space with the goal of eventually the revision knee versus knee effusion.  Worst-case scenario would be recommending an above-knee amputation.  He has been also followed by the infectious disease service.  I did review their last note in terms of the treatment plan from a PICC line standpoint and antibiotic standpoint.  It does look like from review of his labs that they are trending in the right direction.  A sed rate from 01/09/2024 was 43 and a CRP down to 17.2  Examination today of his left knee shows no redness and no drainage.  The swelling is significantly less as well.  He still wears a hinged knee brace and we have had him nonweightbearing.  We still need to keep refilling his oxycodone .  He is working still to have good blood glucose control and he says that has been really well for him.  From my standpoint we will see him back in 6 weeks.  He knows that we have to wait over 3 months before considering a replant.

## 2024-02-08 ENCOUNTER — Telehealth: Payer: Self-pay

## 2024-02-08 ENCOUNTER — Other Ambulatory Visit (HOSPITAL_COMMUNITY): Payer: Self-pay

## 2024-02-08 DIAGNOSIS — T8450XD Infection and inflammatory reaction due to unspecified internal joint prosthesis, subsequent encounter: Secondary | ICD-10-CM

## 2024-02-08 MED ORDER — LINEZOLID 600 MG PO TABS: 600.0000 mg | ORAL_TABLET | Freq: Two times a day (BID) | ORAL | 1 refills | Status: AC

## 2024-02-08 NOTE — Addendum Note (Signed)
 Addended by: OVERTON FAITH T on: 02/08/2024 01:42 PM   Modules accepted: Orders

## 2024-02-08 NOTE — Telephone Encounter (Signed)
 Per Dr. Overton:  Overton, Constance DASEN, MD  [facility] Pool; [facility] Hi team  I have placed 60 day rx linezolid 30 with 1 rf   Please let him know he needs to see us  10-14 days after his linezolid starts for lab check if not provider visit  Provider visit if not at 10-14 days then can be arranged around 4 weeks from now  Thanks   Called patient's wife to notify her and schedule lab appointment, no answer. Left HIPAA compliant voicemail requesting callback.   Prerna Harold, BSN, RN

## 2024-02-08 NOTE — Telephone Encounter (Signed)
 Dianne returned call, notified her that linezolid has been sent in. Lab appointment scheduled for 12/4 for CBC per Dr. Overton. Patient already has provider follow up scheduled for 12/11.  Darien Mignogna, BSN, RN

## 2024-02-08 NOTE — Addendum Note (Signed)
 Addended by: FLORENE BOUCHARD D on: 02/08/2024 01:48 PM   Modules accepted: Orders

## 2024-02-08 NOTE — Telephone Encounter (Signed)
 Pharmacy Patient Advocate Encounter  Insurance verification completed.   The patient is insured through [facility]   Ran test claim for Linezolid. Currently a quantity of 60 is a 30 day supply and the co-pay is $0.00 .   This test claim was processed through [facility]- copay amounts may vary at other pharmacies due to pharmacy/plan contracts, or as the patient moves through the different stages of their insurance plan.

## 2024-02-08 NOTE — Telephone Encounter (Signed)
 Per Dr.Vu stop IV abx and remove PICC line on 11/21. Message sent to [facility].   Dr.Vu can you please send in Linezolid  to [facility]?  Quindell Shere SHAUNNA Letters, CMA

## 2024-02-15 ENCOUNTER — Other Ambulatory Visit: Payer: Self-pay | Admitting: Orthopaedic Surgery

## 2024-02-19 ENCOUNTER — Telehealth: Payer: Self-pay | Admitting: Infectious Diseases

## 2024-02-19 NOTE — Telephone Encounter (Signed)
 Pt called on call Saturday evening. When called back no answer and left message to return call

## 2024-02-20 ENCOUNTER — Other Ambulatory Visit (HOSPITAL_COMMUNITY): Payer: Self-pay

## 2024-02-20 ENCOUNTER — Telehealth: Payer: Self-pay

## 2024-02-20 DIAGNOSIS — R110 Nausea: Secondary | ICD-10-CM

## 2024-02-20 MED ORDER — ONDANSETRON 4 MG PO TBDP: ORAL_TABLET | ORAL | 1 refills | Status: AC

## 2024-02-20 NOTE — Addendum Note (Signed)
 Addended by: WADDELL ALAN PARAS on: 02/20/2024 09:58 AM   Modules accepted: Orders

## 2024-02-20 NOTE — Telephone Encounter (Signed)
 Patient's spouse called regarding concerns with antibiotic Linezolid . Since Thursday night pt c/o nausea, weakness, change in taste. Is not able to eat or drink due to nausea. Would like to know if this could be due to Linezolid  or if something could be called into pharmacy.  [facility] pharmacy.  Spouse will hold off antibiotics until she hears back from staff. Lorenda CHRISTELLA Code, RMA

## 2024-02-20 NOTE — Telephone Encounter (Signed)
 Spoke with patient on the phone about these symptoms. States he has been tolerating the linezolid  well until Friday after Thanksgiving and has been experiencing nausea since then. States he feels the need to gag whenever he tries to eat food but has not vomited. He also feels generalized weakness. States he tries to take linezolid  with toast and has intermittently used Zofran  to help.  Recommended taking Zofran  ODT 30 minutes before taking linezolid  to prevent nausea; sent script to preferred pharmacy. Patient verbalized understanding. Also encouraged him to try and eat a full meal with more fat and protein if he is able before taking linezolid . Requested he try this for 2-3 days and see if symptoms improve. If they do not, Cassie has team looking into tedizolid coverage.   Alan Geralds, PharmD, CPP, BCIDP, AAHIVP Clinical Pharmacist Practitioner Infectious Diseases Clinical Pharmacist [facility]

## 2024-02-20 NOTE — Telephone Encounter (Signed)
 Ran a test claim for Tedizolid and product was not on formulary. Was informed by pharmacist patient may be having hard time tolerating current med Linezolid  which prompted the test claim for Tedizolid. May need Prior Auth for the medication to go through will get more information about this matter from Pharmacist once patient is seen. Linezolid  next fill would be 03/02/24.

## 2024-02-22 ENCOUNTER — Other Ambulatory Visit (HOSPITAL_COMMUNITY): Payer: Self-pay

## 2024-02-22 ENCOUNTER — Telehealth: Payer: Self-pay | Admitting: Pharmacist

## 2024-02-22 NOTE — Telephone Encounter (Signed)
 Submitted appeal today - hopefully they will approve.

## 2024-02-22 NOTE — Telephone Encounter (Signed)
 Insurance denied tedizolid PA; sent appeal today and will await response.  Alan Geralds, PharmD, CPP, BCIDP, AAHIVP Clinical Pharmacist Practitioner Infectious Diseases Clinical Pharmacist [facility]

## 2024-02-22 NOTE — Telephone Encounter (Signed)
 Patient wife called stating patient is still not able to tolerate Linezolid . Is asking if alternative options could be called into pharmacy. Lorenda CHRISTELLA Code, RMA

## 2024-02-22 NOTE — Telephone Encounter (Signed)
 I know Cassie confirmed with Donna/Courtney yesterday that PA for tedizolid was required. Can you all start the PA now? Thank you! We are happy to provide information stating patient could not tolerate linezolid .

## 2024-02-23 ENCOUNTER — Other Ambulatory Visit

## 2024-02-23 ENCOUNTER — Other Ambulatory Visit: Payer: Self-pay | Admitting: Orthopaedic Surgery

## 2024-02-23 ENCOUNTER — Telehealth: Payer: Self-pay

## 2024-02-23 ENCOUNTER — Other Ambulatory Visit: Payer: Self-pay

## 2024-02-23 ENCOUNTER — Encounter (HOSPITAL_COMMUNITY): Payer: Self-pay | Admitting: Internal Medicine

## 2024-02-23 ENCOUNTER — Other Ambulatory Visit (HOSPITAL_COMMUNITY): Payer: Self-pay | Admitting: Internal Medicine

## 2024-02-23 DIAGNOSIS — A4902 Methicillin resistant Staphylococcus aureus infection, unspecified site: Secondary | ICD-10-CM | POA: Insufficient documentation

## 2024-02-23 DIAGNOSIS — T8450XD Infection and inflammatory reaction due to unspecified internal joint prosthesis, subsequent encounter: Secondary | ICD-10-CM

## 2024-02-23 LAB — CBC WITH DIFFERENTIAL/PLATELET
Absolute Lymphocytes: 2504 {cells}/uL (ref 850–3900)
Absolute Monocytes: 458 {cells}/uL (ref 200–950)
Basophils Absolute: 40 {cells}/uL (ref 0–200)
Basophils Relative: 0.5 %
Eosinophils Absolute: 126 {cells}/uL (ref 15–500)
Eosinophils Relative: 1.6 %
HCT: 41 % (ref 39.4–51.1)
Hemoglobin: 13.2 g/dL (ref 13.2–17.1)
MCH: 26.6 pg — ABNORMAL LOW (ref 27.0–33.0)
MCHC: 32.2 g/dL (ref 31.6–35.4)
MCV: 82.5 fL (ref 81.4–101.7)
MPV: 10.2 fL (ref 7.5–12.5)
Monocytes Relative: 5.8 %
Neutro Abs: 4772 {cells}/uL (ref 1500–7800)
Neutrophils Relative %: 60.4 %
Platelets: 127 Thousand/uL — ABNORMAL LOW (ref 140–400)
RBC: 4.97 Million/uL (ref 4.20–5.80)
RDW: 15.2 % — ABNORMAL HIGH (ref 11.0–15.0)
Total Lymphocyte: 31.7 %
WBC: 7.9 Thousand/uL (ref 3.8–10.8)

## 2024-02-23 NOTE — Telephone Encounter (Signed)
 error

## 2024-02-23 NOTE — Telephone Encounter (Signed)
 Patient here for labs and requested to speak to nurse. He is not tolerating linezolid . Reports decreased PO intake, weakness, and nausea.   He asks about the status of the infusion that was discussed with him.   He would like to know if he can stop taking linezolid  in the meantime. Reports he is not going to take his dose this evening.   Secure chat sent to Dr. Overton asking if okay for patient to hold linezolid  until infusion can be arranged.   Per Dr. Overton: He had had 8 weeks tx, so i supposed a few days are fine. Latest next week   Discussed with Cece, infusion team is going to be reaching out to patient for scheduling.   [facility], relayed that Dr. Overton okay with him holding linezolid  for a few days if necessary and that infusion team should be reaching out to him to schedule.  Nyana Haren, BSN, RN

## 2024-02-23 NOTE — Telephone Encounter (Signed)
 Patient spouse called to follow up on issues tolerating antbx. States no improvement since starting zofran  nauseas worse. Spoke with Dr. Overton who recommended switching to Dalvance 1500 mg weekly x 2. Is working with infusion team to setup appointment and transition.  Patient is asking if he can stop oral antbx until he starts IV or continue until he has first dose. Lorenda CHRISTELLA Code, RMA

## 2024-02-24 ENCOUNTER — Telehealth (HOSPITAL_COMMUNITY): Payer: Self-pay | Admitting: Pharmacy Technician

## 2024-02-24 NOTE — Telephone Encounter (Signed)
 Per Dr. Overton: He can get.a couple.days break and restart.if its long  But if early next week then ok

## 2024-02-24 NOTE — Telephone Encounter (Signed)
 Auth Submission: NO AUTH NEEDED Site of care: [facility] Payer: [employer] Medication & CPT/J Code(s) submitted: Dalvance (Dalbavancin) [id number] Diagnosis Code: A49.02 Route of submission (phone, fax, portal):  Phone # Fax # Auth type: Buy/Bill HB Units/visits requested: 1500MG  X 2 DOSES Reference number:  Approval from: 02/24/2024 to 04/21/24     Dagoberto Armour, CPhT [facility] Phone: [phone] 02/24/2024

## 2024-02-27 NOTE — Telephone Encounter (Signed)
 Patient's wife called to follow up on status of scheduling dalvance  infusion. Advised her that Dr. Overton would like [name] to resume the linezolid  if infusion cannot be scheduled for early this week. Secure chat sent to infusion team requesting update.   [name], BSN, RN

## 2024-02-27 NOTE — Telephone Encounter (Signed)
 Per infusion staff, patient is in work queue for scheduling and they will work on it today.  Called Diane back, no answer. Called and spoke with [facility], notified him that infusion staff should be reaching out to him today to schedule.   Taseen Marasigan, BSN, RN

## 2024-02-28 ENCOUNTER — Inpatient Hospital Stay (HOSPITAL_COMMUNITY): Admission: RE | Admit: 2024-02-28 | Discharge: 2024-02-28 | Attending: Internal Medicine

## 2024-02-28 ENCOUNTER — Telehealth: Payer: Self-pay

## 2024-02-28 ENCOUNTER — Other Ambulatory Visit (HOSPITAL_COMMUNITY): Payer: Self-pay

## 2024-02-28 VITALS — BP 120/72 | HR 72 | Temp 98.6°F | Resp 17 | Ht 68.0 in | Wt 195.0 lb

## 2024-02-28 DIAGNOSIS — A4902 Methicillin resistant Staphylococcus aureus infection, unspecified site: Secondary | ICD-10-CM | POA: Insufficient documentation

## 2024-02-28 MED ORDER — DEXTROSE 5 % IV SOLN
1500.0000 mg | Freq: Once | INTRAVENOUS | Status: AC
  Administered 2024-02-28: 1500 mg via INTRAVENOUS
  Filled 2024-02-28: qty 75

## 2024-02-28 MED ORDER — DEXTROSE 5 % IV BOLUS
30.0000 mL | Freq: Once | INTRAVENOUS | Status: AC
  Administered 2024-02-28: 30 mL via INTRAVENOUS
  Filled 2024-02-28: qty 50

## 2024-02-28 NOTE — Telephone Encounter (Signed)
 Ok In that case, I will not send prescription. I will let you discuss with him on 12/11 since dalbavancin will cover him for a while. Thanks!

## 2024-02-28 NOTE — Telephone Encounter (Signed)
 Yay! Dr. Overton - I can reach out today and send prescription unless you wanted to wait until 12/11 appointment.

## 2024-02-28 NOTE — Telephone Encounter (Signed)
 Received notification from [facility] [employer] regarding a prior authorization for Sivextro. Authorization has been APPROVED from 02/28/24 to 03/21/24.   Per test claim, copay for 30 days supply is $0.00  Patient can fill through [facility]: [phone]   Authorization # [id number] Phone # [phone]

## 2024-02-28 NOTE — Telephone Encounter (Signed)
 Thats great [name] give him a 30nday supply  If he can tolerate that then he doeant need the infusion  But if he cant then he should proceed to the infusion of dalvance    Please let him know dont cancel anything  Thank you

## 2024-02-28 NOTE — Telephone Encounter (Signed)
 Sounds good.  I think he will be done by then. His hardware has been removed  Thank you

## 2024-03-01 ENCOUNTER — Other Ambulatory Visit: Payer: Self-pay

## 2024-03-01 ENCOUNTER — Ambulatory Visit (INDEPENDENT_AMBULATORY_CARE_PROVIDER_SITE_OTHER): Admitting: Internal Medicine

## 2024-03-01 ENCOUNTER — Encounter: Payer: Self-pay | Admitting: Internal Medicine

## 2024-03-01 VITALS — BP 106/61 | HR 84 | Temp 98.9°F | Wt 195.0 lb

## 2024-03-01 DIAGNOSIS — T8454XD Infection and inflammatory reaction due to internal left knee prosthesis, subsequent encounter: Secondary | ICD-10-CM

## 2024-03-01 DIAGNOSIS — T8450XD Infection and inflammatory reaction due to unspecified internal joint prosthesis, subsequent encounter: Secondary | ICD-10-CM

## 2024-03-01 NOTE — Patient Instructions (Signed)
 See me in 04/2024 before new joint is placed   Will discuss plan of attack for antibiotics when joint is placed   Do dalbavancin next week

## 2024-03-01 NOTE — Progress Notes (Signed)
 [facility]  Patient Active Problem List   Diagnosis Date Noted   Infection with methicillin-resistant Staphylococcus aureus (MRSA) 02/23/2024   MRSA infection 05/17/2023   Staphylococcus epidermidis infection 05/17/2023   Chronic infection of left knee (HCC) 03/24/2023   Status post revision of total replacement of left knee 08/27/2021   Status post revision of total knee replacement, left 08/14/2021   PICC (peripherally inserted central catheter) in place 06/09/2021   Vaccine counseling 05/04/2021   Skin lesion 01/01/2021   Type 2 diabetes mellitus with other specified complication (HCC) 01/01/2021   Nausea and vomiting 09/11/2020   MSSA (methicillin susceptible Staphylococcus aureus) infection 09/03/2020   Effusion, left knee 08/14/2020   Infection of total left knee replacement 08/14/2020   Status post total left knee replacement 03/21/2019   S/P carpal tunnel release right 02/22/19 03/08/2019   Carpal tunnel syndrome of right wrist    HTN (hypertension) 10/27/2018   Prostate cancer (HCC) 05/30/2017   Malignant neoplasm of prostate (HCC) 04/28/2017   Chronic pain of both knees 02/21/2017   Unilateral primary osteoarthritis, left knee 02/21/2017   Unilateral primary osteoarthritis, right knee 02/21/2017      Subjective:    Patient ID: [name], male    DOB: 1955-10-23, 68 y.o.   MRN: [id number]  Chief Complaint  Patient presents with   Follow-up    HPI:  [name] is a 68 y.o. male dm2, hx prostate cancer, left lower chronic lymphedema, gerd, htn/hlp, index left TKA 02/2019 with recurrent knee pji admitted here for same despite being on suppressive bactrim , complicated by another pji, here for f/u  Time course: 07/2020 initial left knee pji s/p dair; cx mssa; s/p 6 weeks cefazolin  and couldn't tolerate rifampin , followed by suppressive cefadroxil  04/2021 recurrent left knee pji despite on abx; s/p removal of hardware  and abx spacer; cx mrsa; vanc--> dapto course of 05/21/21-07/02/21; underwent new knee placement 12/16/21 recurrent swelling/pain aspiration cx negative (although was getting doxy empirically); and ultimately underwent on 03/25/2023 I&D with polyliner exchange; cx mrse. Initial 3 weeks rifampin  but couldn't tolerate so stopped; transitioned to bactrim  04/28/23 and continued on it 11/04/23 Despite being on bactrim  continue to have swelling/pain and was even seen at [facility] for second opinion and underwent I&D on this date, sent out on high dose bactrim . No cultures were done   12/16/23 readmitted for ongoing sx with decision to remove all hardware and placed abx spacer. Possible aka if unable to rid of infection   He has been taking bactrim  prior to surgery   Cx finalized mrse (intermediate susceptibility to tetra and r bactrim )   ---- 03/01/24 id clinic f/u Patient transitioned to linezolid  but couldn't tolerate so started on dalvance  (first shot 02/28/24) He had finished 8 weeks of daptomycin  by 02/10/24 Very little pain left knee; incision healed No n/v since stopping linezolid    Allergies[1]    Outpatient Medications Prior to Visit  Medication Sig Dispense Refill   aspirin  EC 81 MG tablet Take 1 tablet (81 mg total) by mouth 2 (two) times daily. Swallow whole. 30 tablet 1   Continuous Blood Gluc Sensor (FREESTYLE LIBRE 14 DAY SENSOR) MISC SMARTSIG:Topical Every 10 Days     escitalopram  (LEXAPRO ) 20 MG tablet Take 20 mg by mouth daily.     insulin  aspart (NOVOLOG ) 100 UNIT/ML injection Inject 20 Units into the skin 3 (three) times daily with meals. Per sliding scale  linezolid  (ZYVOX ) 600 MG tablet Take 1 tablet (600 mg total) by mouth 2 (two) times daily. 60 tablet 1   losartan -hydrochlorothiazide  (HYZAAR) 50-12.5 MG tablet TAKE (1) TABLET BY MOUTH ONCE DAILY. 30 tablet 0   ondansetron  (ZOFRAN -ODT) 4 MG disintegrating tablet Take 1-2 tablets 30 minutes prior to taking  linezolid  and then use as needed every 8 hours. 20 tablet 1   oxyCODONE  (OXY IR/ROXICODONE ) 5 MG immediate release tablet Take 1 tablet (5 mg total) by mouth 2 (two) times daily as needed for severe pain (pain score 7-10). 30 tablet 0   rosuvastatin  (CRESTOR ) 20 MG tablet TAKE 1 TABLET BY MOUTH EVERY DAY 90 tablet 1   tiZANidine  (ZANAFLEX ) 2 MG tablet TAKE ONE TABLET BY MOUTH EVERY 6 HOURS AS NEEDED. 30 tablet 1   TRESIBA  FLEXTOUCH 200 UNIT/ML FlexTouch Pen Inject 90 Units into the skin daily.     zolpidem  (AMBIEN ) 10 MG tablet Take 10 mg by mouth at bedtime as needed for sleep.     Facility-Administered Medications Prior to Visit  Medication Dose Route Frequency Provider Last Rate Last Admin   alteplase  (ACTIVASE ) injection 2 mg  2 mg Intravenous Once          Social History   Socioeconomic History   Marital status: Married    Spouse name: Not on file   Number of children: 1   Years of education: Not on file   Highest education level: Not on file  Occupational History   Occupation: pastor  Tobacco Use   Smoking status: Never   Smokeless tobacco: Never  Vaping Use   Vaping status: Never Used  Substance and Sexual Activity   Alcohol use: No   Drug use: No   Sexual activity: Yes  Other Topics Concern   Not on file  Social History Narrative   [name]   Married to [employer]   Daughter, Mark Rory Burkes, RN III, Resides in [place]   Social Drivers of Health   Tobacco Use: Low Risk (02/01/2024)   Patient History    Smoking Tobacco Use: Never    Smokeless Tobacco Use: Never    Passive Exposure: Not on file  Financial Resource Strain: Low Risk (11/12/2021)   Overall Financial Resource Strain (CARDIA)    Difficulty of Paying Living Expenses: Not hard at all  Food Insecurity: No Food Insecurity (12/16/2023)   Epic    Worried About Radiation Protection Practitioner of Food in the Last Year: Never true    Ran Out of Food in the Last Year: Never true  Transportation Needs: No Transportation  Needs (12/16/2023)   Epic    Lack of Transportation (Medical): No    Lack of Transportation (Non-Medical): No  Physical Activity: Not on file  Stress: Not on file  Social Connections: Socially Integrated (12/16/2023)   Social Connection and Isolation Panel    Frequency of Communication with Friends and Family: More than three times a week    Frequency of Social Gatherings with Friends and Family: More than three times a week    Attends Religious Services: More than 4 times per year    Active Member of [employer] or Organizations: Yes    Attends Banker Meetings: More than 4 times per year    Marital Status: Married  Catering Manager Violence: Not At Risk (12/16/2023)   Epic    Fear of Current or Ex-Partner: No    Emotionally Abused: No    Physically Abused: No    Sexually Abused:  No  Depression (PHQ2-9): Low Risk (05/23/2023)   Depression (PHQ2-9)    PHQ-2 Score: 0  Alcohol Screen: Not on file  Housing: Low Risk (12/16/2023)   Epic    Unable to Pay for Housing in the Last Year: No    Number of Times Moved in the Last Year: 0    Homeless in the Last Year: No  Utilities: Not At Risk (12/16/2023)   Epic    Threatened with loss of utilities: No  Health Literacy: Not on file      Review of Systems    All other ros negative  Objective:    BP 106/61   Pulse 84   Temp 98.9 F (37.2 C) (Oral)   Wt 195 lb (88.5 kg) Comment: pt reports  SpO2 98%   BMI 29.65 kg/m  Nursing note and vital signs reviewed.  Physical Exam     General/constitutional: no distress, pleasant HEENT: Normocephalic, PER, Conj Clear, EOMI, Oropharynx clear Neck supple CV: rrr no mrg Lungs: clear to auscultation, normal respiratory effort Abd: Soft, Nontender Ext: no edema Skin: No Rash Neuro: nonfocal MSK: left lower ext in brace    Labs: Lab Results  Component Value Date   WBC 7.9 02/23/2024   HGB 13.2 02/23/2024   HCT 41.0 02/23/2024   MCV 82.5 02/23/2024   PLT 127 (L) 02/23/2024    Last metabolic panel Lab Results  Component Value Date   GLUCOSE 236 (H) 12/17/2023   NA 134 (L) 12/17/2023   K 4.8 12/17/2023   CL 102 12/17/2023   CO2 22 12/17/2023   BUN 24 (H) 12/17/2023   CREATININE 1.10 12/17/2023   GFRNONAA >60 12/17/2023   CALCIUM  8.5 (L) 12/17/2023   PROT 6.2 04/14/2023   ALBUMIN  3.4 (L) 05/26/2021   BILITOT 0.4 04/14/2023   ALKPHOS 73 05/26/2021   AST 31 04/14/2023   ALT 33 04/14/2023   ANIONGAP 11 12/17/2023    Micro:  Serology:  Imaging:  Assessment & Plan:   Problem List Items Addressed This Visit   None Visit Diagnoses       Infection of prosthetic joint, subsequent encounter    -  Primary         No orders of the defined types were placed in this encounter.    68 y.o. male dm2, hx prostate cancer, left lower chronic lymphedema, gerd, htn/hlp, index left TKA 02/2019 with recurrent knee pji admitted here for same despite being on suppressive bactrim , complicated by another pji, here for f/u    Time course: 07/2020 initial left knee pji s/p dair; cx mssa; s/p 6 weeks cefazolin  and couldn't tolerate rifampin , followed by suppressive cefadroxil  04/2021 recurrent left knee pji despite on abx; s/p removal of hardware and abx spacer; cx mrsa; vanc--> dapto course of 05/21/21-07/02/21; underwent new knee placement 12/16/21 recurrent swelling/pain aspiration cx negative (although was getting doxy empirically); and ultimately underwent on 03/25/2023 I&D with polyliner exchange; cx mrse. Initial 3 weeks rifampin  but couldn't tolerate so stopped; transitioned to bactrim  04/28/23 and continued on it 11/04/23 Despite being on bactrim  continue to have swelling/pain and was even seen at [facility] for second opinion and underwent I&D on this date, sent out on high dose bactrim . No cultures were done   12/16/23 readmitted for ongoing sx with decision to remove all hardware and placed abx spacer. Possible aka if unable to rid of infection   He  has been taking bactrim  prior to surgery   Cx finalized mrse (  intermediate susceptibility to tetra and r bactrim )  S/p 8 weeks dapto on 02/10/24; planned 3 months tx total. Unable to tolerate linezolid   ---- 03/01/24 id clinic f/u Patient transitioned to linezolid  but couldn't tolerate so given dalvance  starting 02/28/24 -- next shot is next week He had finished 8 weeks of daptomycin  by 02/10/24  Would plan to wait 3 months from the first dalvance  shot before considering new joint placement  And once a new joint is placed, potentially there is some role for prolonged prophylaxis antibiotics. We certainly can consider dalvance  again given intolerability of oral antibiotics  F/u 04/2024 sometime before new joint is placed.      Follow-up: Return in about 10 weeks (around 05/10/2024).      Constance ONEIDA Passer, MD [facility] 03/01/2024, 3:34 PM     [1]  Allergies Allergen Reactions   Rifampin  Nausea Only and Other (See Comments)    Skin crawling- bodyache

## 2024-03-02 ENCOUNTER — Other Ambulatory Visit: Payer: Self-pay | Admitting: Orthopaedic Surgery

## 2024-03-06 ENCOUNTER — Inpatient Hospital Stay (HOSPITAL_COMMUNITY): Admission: RE | Admit: 2024-03-06 | Discharge: 2024-03-06 | Attending: Internal Medicine

## 2024-03-06 VITALS — BP 148/71 | HR 74 | Resp 16

## 2024-03-06 DIAGNOSIS — A4902 Methicillin resistant Staphylococcus aureus infection, unspecified site: Secondary | ICD-10-CM | POA: Diagnosis not present

## 2024-03-06 MED ORDER — DEXTROSE 5 % IV BOLUS
30.0000 mL | Freq: Once | INTRAVENOUS | Status: AC
  Administered 2024-03-06: 13:00:00 30 mL via INTRAVENOUS
  Filled 2024-03-06: qty 50

## 2024-03-06 MED ORDER — DEXTROSE 5 % IV SOLN
1500.0000 mg | Freq: Once | INTRAVENOUS | Status: AC
  Administered 2024-03-06: 13:00:00 1500 mg via INTRAVENOUS
  Filled 2024-03-06: qty 75

## 2024-03-07 ENCOUNTER — Ambulatory Visit: Admitting: Physician Assistant

## 2024-03-19 ENCOUNTER — Ambulatory Visit (INDEPENDENT_AMBULATORY_CARE_PROVIDER_SITE_OTHER): Admitting: Orthopaedic Surgery

## 2024-03-19 ENCOUNTER — Encounter: Payer: Self-pay | Admitting: Orthopaedic Surgery

## 2024-03-19 DIAGNOSIS — T8454XD Infection and inflammatory reaction due to internal left knee prosthesis, subsequent encounter: Secondary | ICD-10-CM

## 2024-03-19 NOTE — Progress Notes (Signed)
 The patient is now 3 months status post an excision arthroplasty with removing all components from a chronically infected left total knee replacement.  He is followed closely by the infectious disease service.  He has a temporary antibiotic spacer in the knee and is on IV antibiotics.  The hope is to try to eradicate as much infection as possible in order to at least place some type of arthroplasty device in the knee with the only option being an above-knee amputation.  I did review the most recent notes from earlier this month that Dr. Overton has left in the chart as a relates to the patient's medications and course from an infectious disease standpoint.  Daptomycin  has been completed and now the patient has had an injection of Dalvance .  Overall he looks better.  His left leg is much smaller than its ever been even with lymphedema having improved significantly.  There is no redness and drainage.  The incision is healed nicely.  He understands that there is 1 more chance of potentially getting something in his knee that would support his weight and maybe flex a little bit with a full revision arthroplasty.  We will see him back in February after he sees Dr. Overton and then plan on revision surgery the last week of March 2026.  He does run low on pain medication they need to reach out to us .

## 2024-04-12 ENCOUNTER — Telehealth: Payer: Self-pay | Admitting: Orthopaedic Surgery

## 2024-04-12 ENCOUNTER — Other Ambulatory Visit: Payer: Self-pay | Admitting: Orthopaedic Surgery

## 2024-04-12 MED ORDER — OXYCODONE HCL 5 MG PO TABS: 5.0000 mg | ORAL_TABLET | Freq: Two times a day (BID) | ORAL | 0 refills | Status: AC | PRN

## 2024-04-12 NOTE — Telephone Encounter (Signed)
 Pt's wife called for refill of oxycodone . Please send to [facility]. Pt number is [phone].

## 2024-05-14 ENCOUNTER — Ambulatory Visit: Admitting: Orthopaedic Surgery

## 2024-05-15 ENCOUNTER — Ambulatory Visit: Payer: Self-pay | Admitting: Internal Medicine
